# Patient Record
Sex: Male | Born: 1960 | Race: Black or African American | Hispanic: No | Marital: Married | Smoking: Current every day smoker
Health system: Southern US, Community
[De-identification: ages and names within clinical notes are randomized; demographics above are authoritative.]

## PROBLEM LIST (undated history)

## (undated) DIAGNOSIS — I255 Ischemic cardiomyopathy: Secondary | ICD-10-CM

## (undated) DIAGNOSIS — J40 Bronchitis, not specified as acute or chronic: Secondary | ICD-10-CM

## (undated) DIAGNOSIS — I10 Essential (primary) hypertension: Secondary | ICD-10-CM

## (undated) DIAGNOSIS — E785 Hyperlipidemia, unspecified: Secondary | ICD-10-CM

## (undated) DIAGNOSIS — I214 Non-ST elevation (NSTEMI) myocardial infarction: Secondary | ICD-10-CM

## (undated) DIAGNOSIS — C3492 Malignant neoplasm of unspecified part of left bronchus or lung: Secondary | ICD-10-CM

## (undated) DIAGNOSIS — I2510 Atherosclerotic heart disease of native coronary artery without angina pectoris: Secondary | ICD-10-CM

## (undated) DIAGNOSIS — I340 Nonrheumatic mitral (valve) insufficiency: Secondary | ICD-10-CM

## (undated) HISTORY — DX: Essential (primary) hypertension: I10

## (undated) HISTORY — DX: Ischemic cardiomyopathy: I25.5

## (undated) HISTORY — PX: COLONOSCOPY: SHX174

---

## 1999-09-10 ENCOUNTER — Emergency Department (HOSPITAL_COMMUNITY): Admission: EM | Admit: 1999-09-10 | Discharge: 1999-09-10 | Payer: Self-pay | Admitting: Emergency Medicine

## 2001-04-04 ENCOUNTER — Emergency Department (HOSPITAL_COMMUNITY): Admission: EM | Admit: 2001-04-04 | Discharge: 2001-04-04 | Payer: Self-pay | Admitting: Emergency Medicine

## 2001-04-07 ENCOUNTER — Emergency Department (HOSPITAL_COMMUNITY): Admission: EM | Admit: 2001-04-07 | Discharge: 2001-04-07 | Payer: Self-pay | Admitting: Internal Medicine

## 2001-04-10 ENCOUNTER — Emergency Department (HOSPITAL_COMMUNITY): Admission: EM | Admit: 2001-04-10 | Discharge: 2001-04-10 | Payer: Self-pay | Admitting: Emergency Medicine

## 2001-04-20 ENCOUNTER — Emergency Department (HOSPITAL_COMMUNITY): Admission: EM | Admit: 2001-04-20 | Discharge: 2001-04-20 | Payer: Self-pay | Admitting: *Deleted

## 2001-08-03 ENCOUNTER — Ambulatory Visit (HOSPITAL_COMMUNITY): Admission: RE | Admit: 2001-08-03 | Discharge: 2001-08-03 | Payer: Self-pay | Admitting: General Surgery

## 2003-06-16 HISTORY — PX: NECK SURGERY: SHX720

## 2003-07-25 ENCOUNTER — Ambulatory Visit (HOSPITAL_COMMUNITY): Admission: RE | Admit: 2003-07-25 | Discharge: 2003-07-25 | Payer: Self-pay | Admitting: Family Medicine

## 2003-10-27 ENCOUNTER — Emergency Department (HOSPITAL_COMMUNITY): Admission: EM | Admit: 2003-10-27 | Discharge: 2003-10-27 | Payer: Self-pay | Admitting: *Deleted

## 2003-11-08 ENCOUNTER — Encounter (HOSPITAL_COMMUNITY): Admission: RE | Admit: 2003-11-08 | Discharge: 2003-12-08 | Payer: Self-pay | Admitting: Family Medicine

## 2003-11-21 ENCOUNTER — Inpatient Hospital Stay (HOSPITAL_COMMUNITY): Admission: EM | Admit: 2003-11-21 | Discharge: 2003-11-22 | Payer: Self-pay | Admitting: *Deleted

## 2003-11-26 ENCOUNTER — Ambulatory Visit: Admission: RE | Admit: 2003-11-26 | Discharge: 2003-11-26 | Payer: Self-pay | Admitting: Neurosurgery

## 2003-12-08 ENCOUNTER — Inpatient Hospital Stay (HOSPITAL_COMMUNITY): Admission: RE | Admit: 2003-12-08 | Discharge: 2003-12-09 | Payer: Self-pay | Admitting: Neurosurgery

## 2003-12-11 ENCOUNTER — Emergency Department (HOSPITAL_COMMUNITY): Admission: EM | Admit: 2003-12-11 | Discharge: 2003-12-11 | Payer: Self-pay | Admitting: Emergency Medicine

## 2005-12-09 IMAGING — CR DG ABDOMEN ACUTE W/ 1V CHEST
3 series · 3 of 3 positions shown · non-contrast
Comparison: none

CLINICAL DATA: Abdominal pain. 
 ACUTE ABDOMEN SERIES ? 11/21/03
 No prior studies.

[view not recorded (1 of 3)]
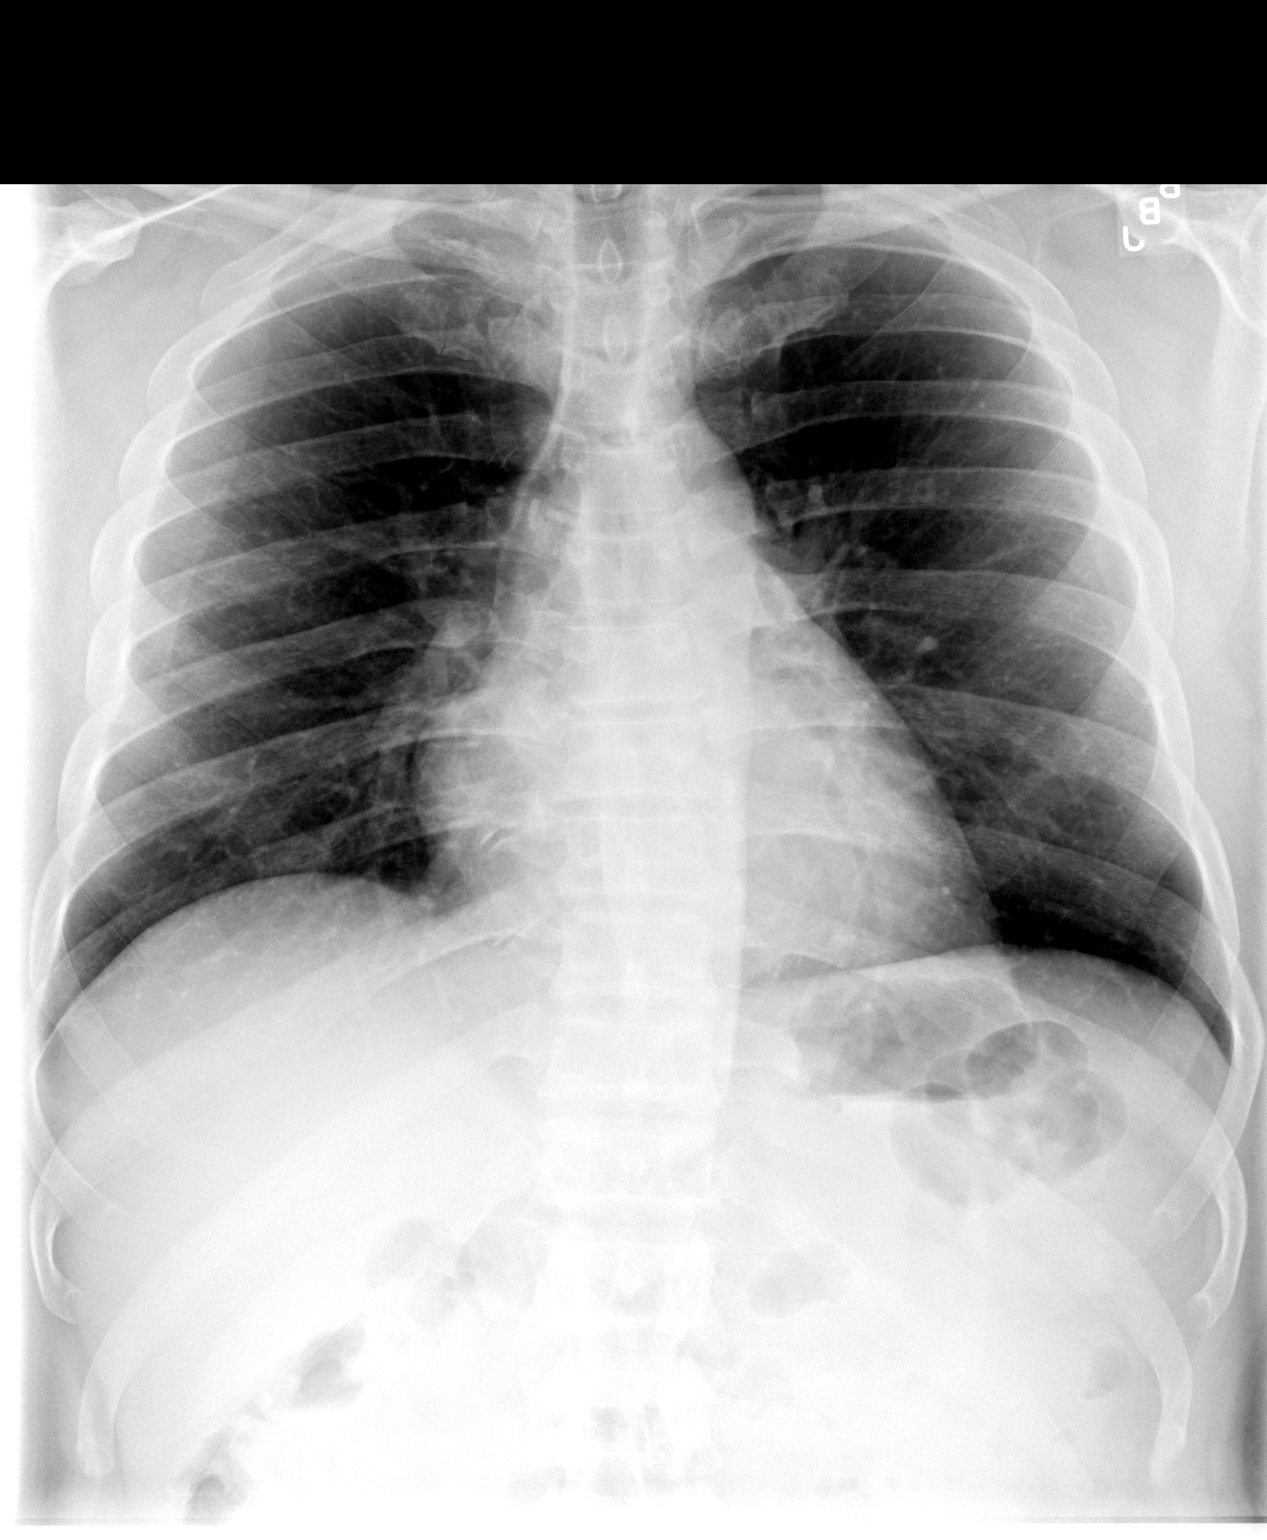

[view not recorded (2 of 3)]
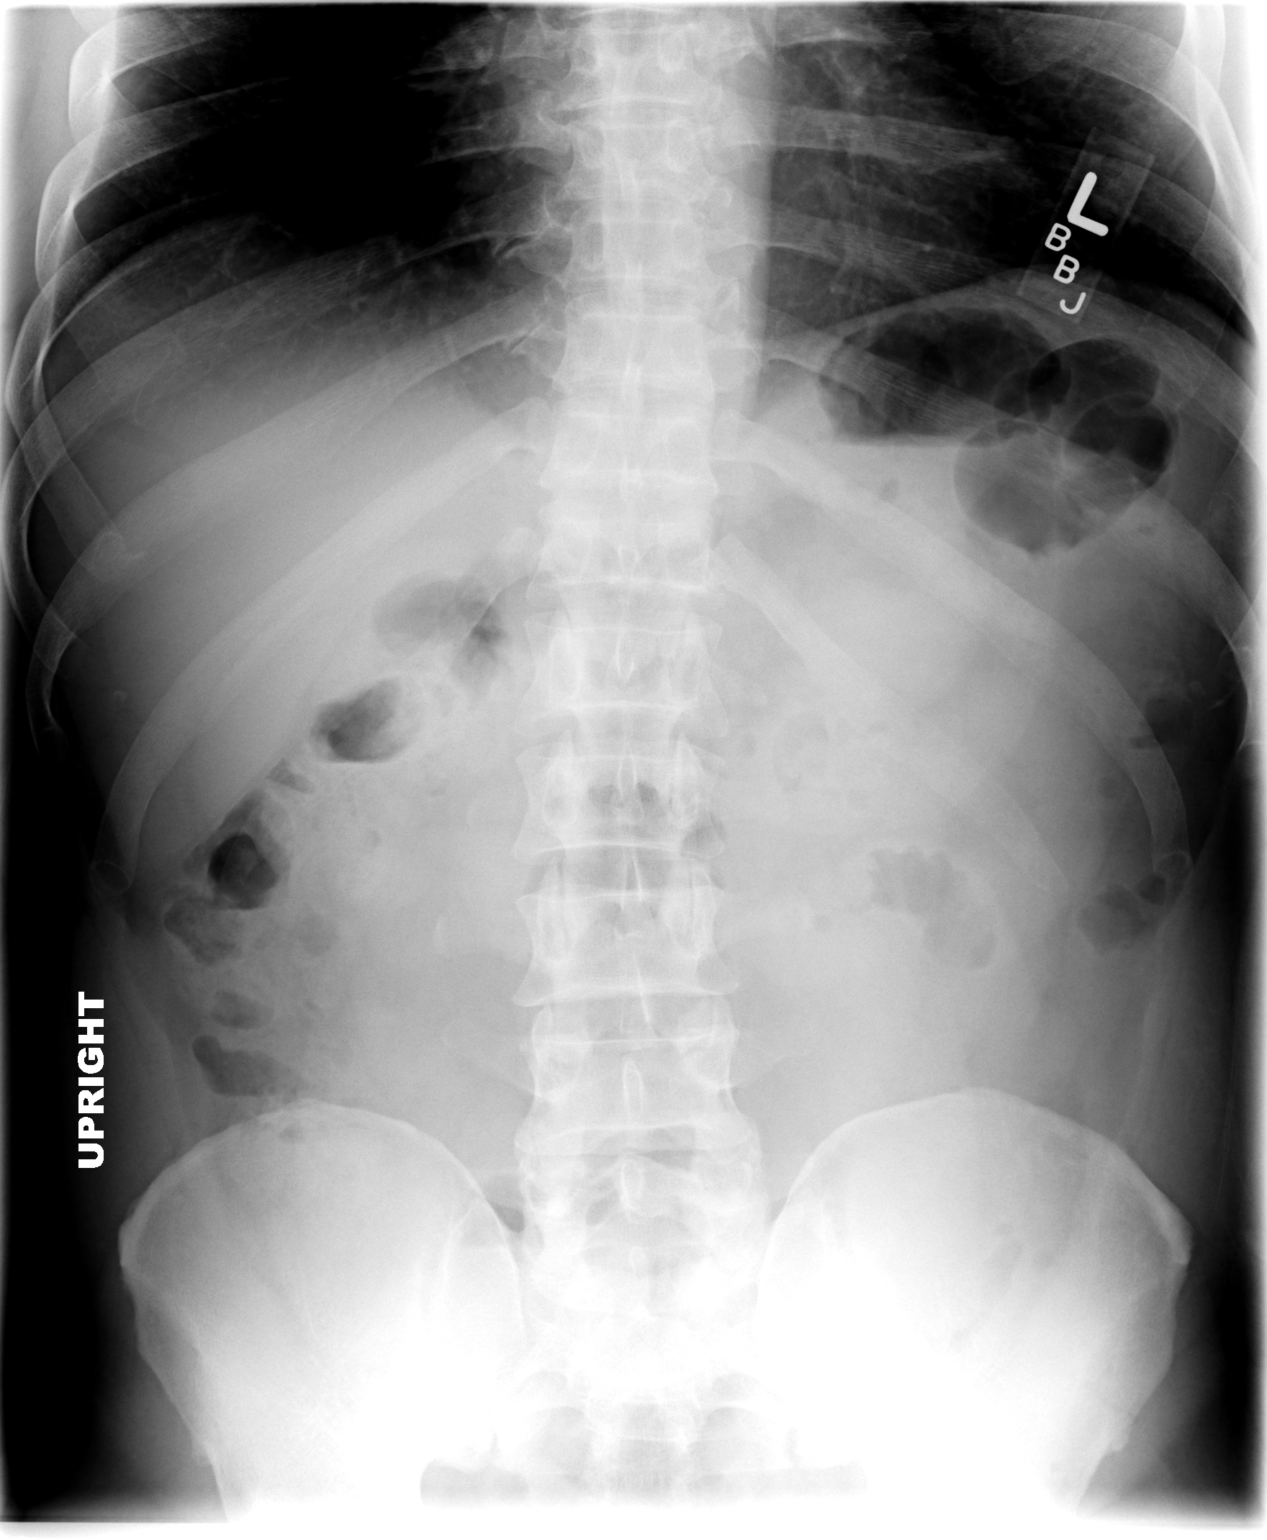

[view not recorded (3 of 3)]
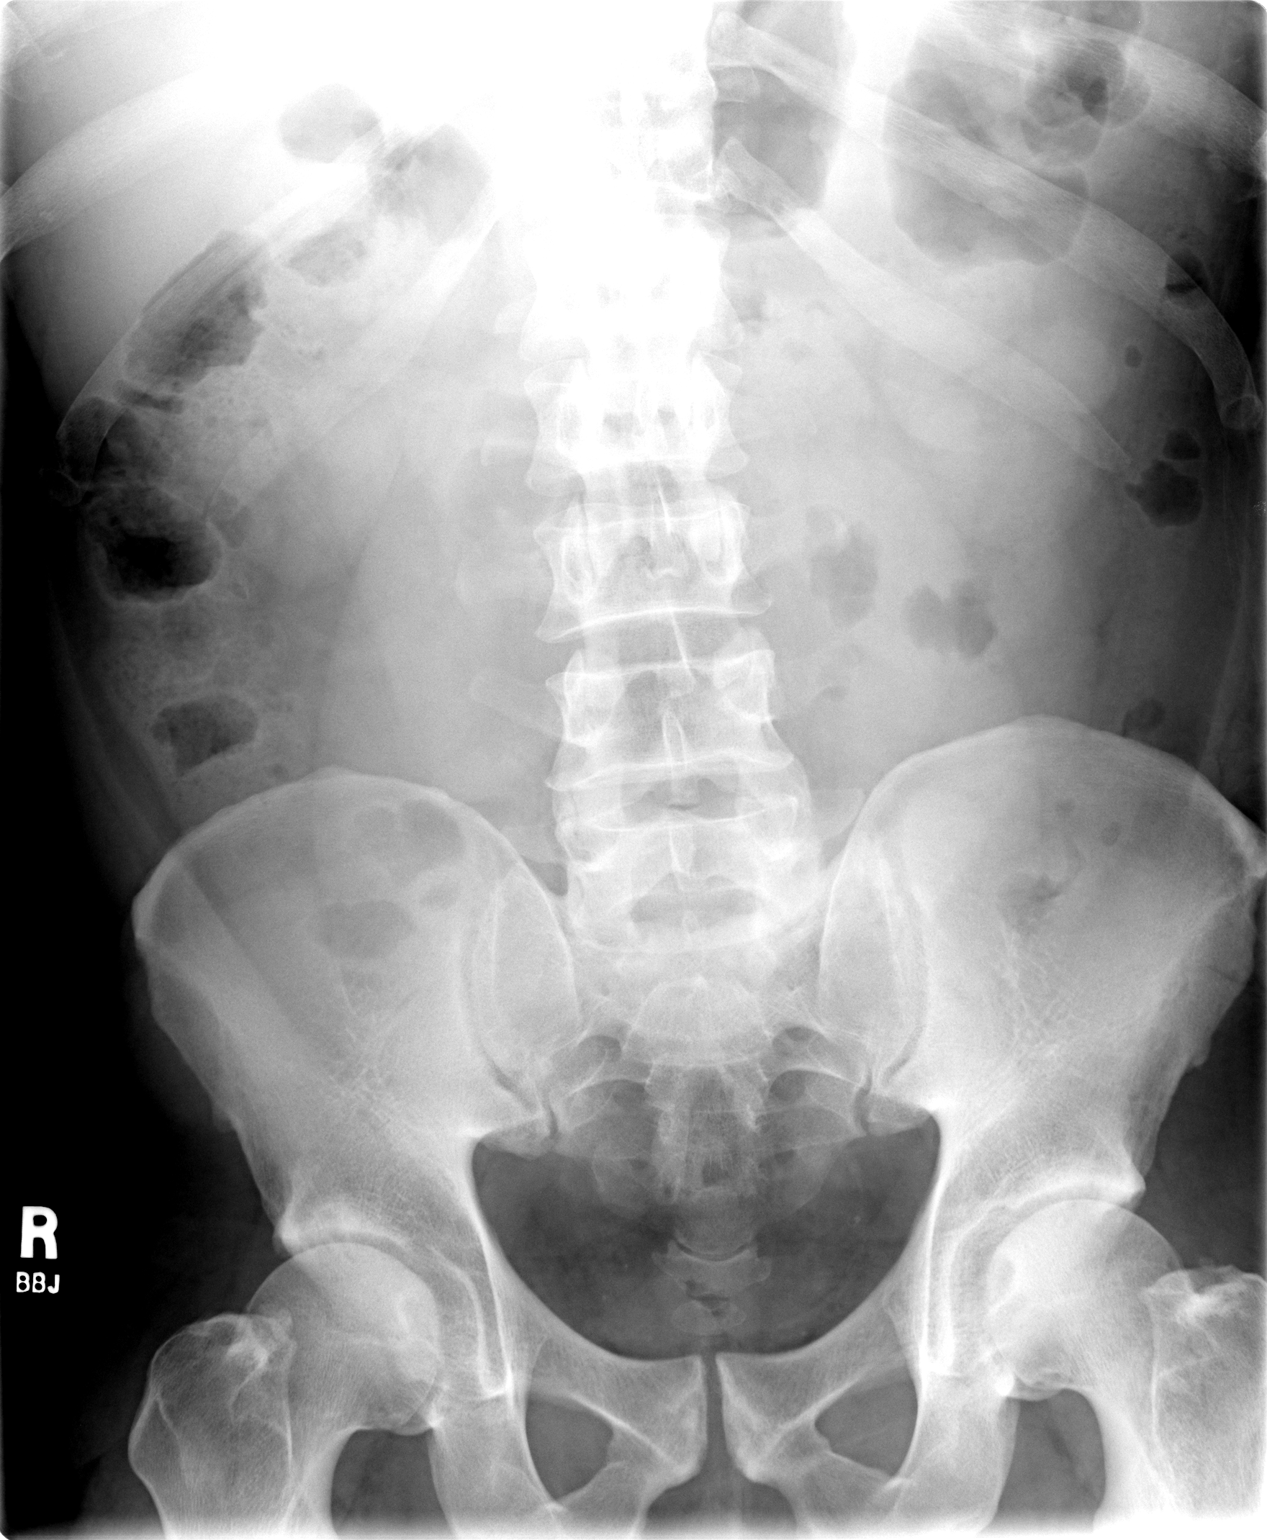

[3 of 3 positions shown; findings below may reference images not displayed]

FINDINGS: The lungs appear clear and the heart and the mediastinum are normal in radiographic appearance. No free intraperitoneal gas is evident beneath the hemidiaphragms.  No significant abnormal calcific densities are identified. No dilated bowel is noted.  Gas and stool are present in the colon. 
 IMPRESSION 
 No acute findings.

## 2005-12-09 IMAGING — CT CT PELVIS W/ CM
1 of 3 series · 14 of 32 positions shown, 19 images · IV contrast (CONTRAST)
Comparison: none

CLINICAL DATA: Abdominal pain and nausea/vomiting for one week.
 CT OF THE ABDOMEN AND PELVIS WITH CONTRAST
 During the intravenous administration of 100 cc Omnipaque 300, helical CT through the abdomen and pelvis was performed at 5 mm collimation.  Oral contrast had been administered.  Delayed helical 5 mm images through the kidneys were obtained. 
 Comparison none. 
 ABDOMEN
 The liver, spleen, pancreas, adrenal glands, and kidneys are normal in appearance.  Gallbladder is unremarkable by CT and there is no biliary ductal dilation.  The stomach and the visualized large and small bowel are unremarkable in the upper abdomen.  The appendix is identified in the right mid abdomen and is normal in appearance.  There is no free fluid.  There is no significant lymphadenopathy.  The visualized lung bases appear clear. 
 IMPRESSION
 Normal CT of the abdomen. 
 PELVIS
 The colon and small bowel are normal in appearance.  Urinary bladder is grossly normal.  The prostate gland is normal in size.  There is no significant lymphadenopathy.  There is no free fluid. 
 Normal CT of the pelvis.

[Series 5584: — · axial · 0.74mm/px · z∈[+1176,+1596]mm · 14 of 96 slices shown, 19 images]
[im 6/96  soft-tissue]
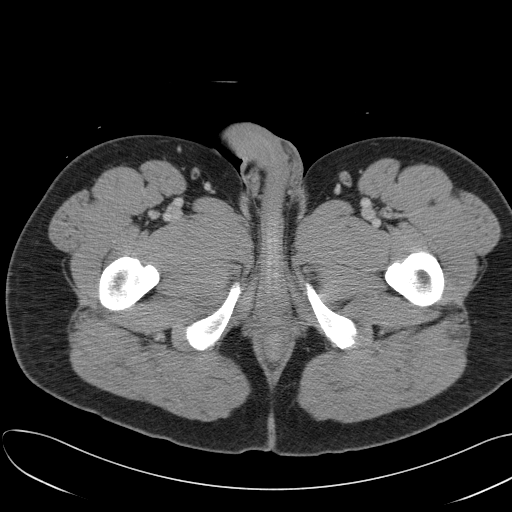
[im 6/96  bone]
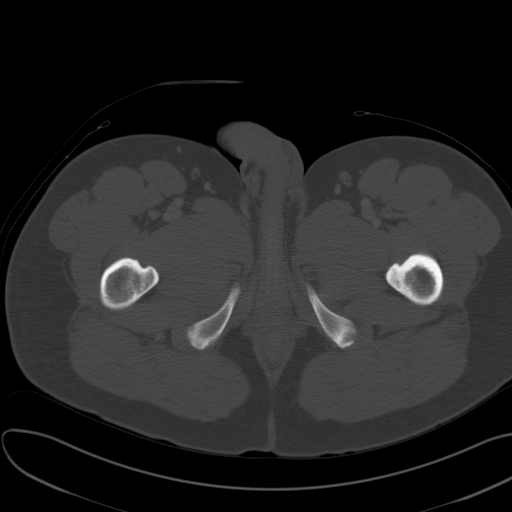
[im 12/96  soft-tissue]
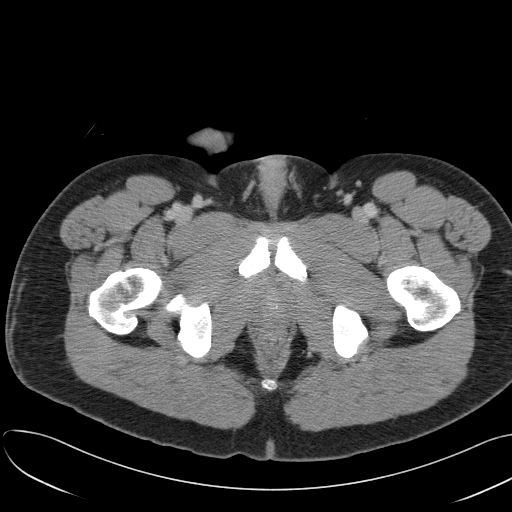
[im 18/96  soft-tissue]
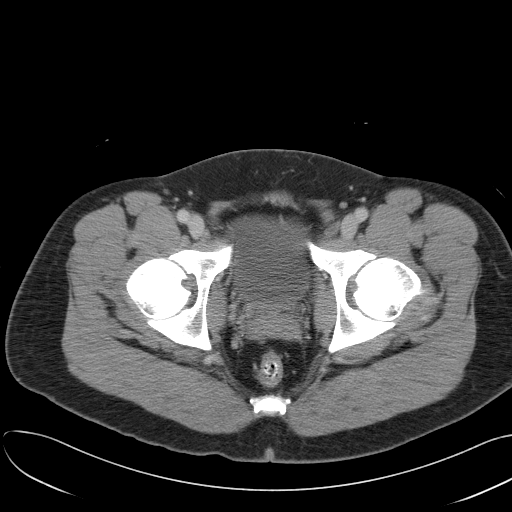
[im 30/96  soft-tissue]
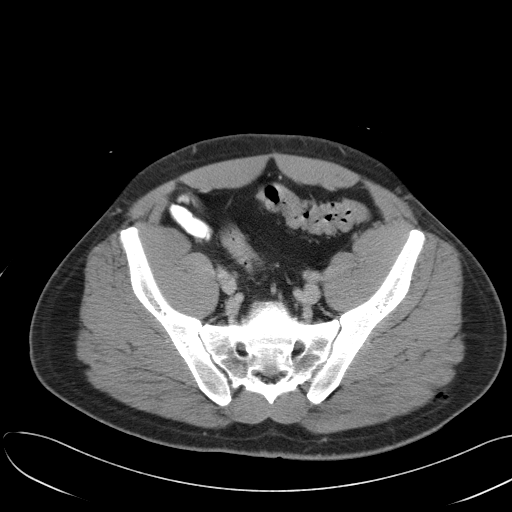
[im 36/96  soft-tissue]
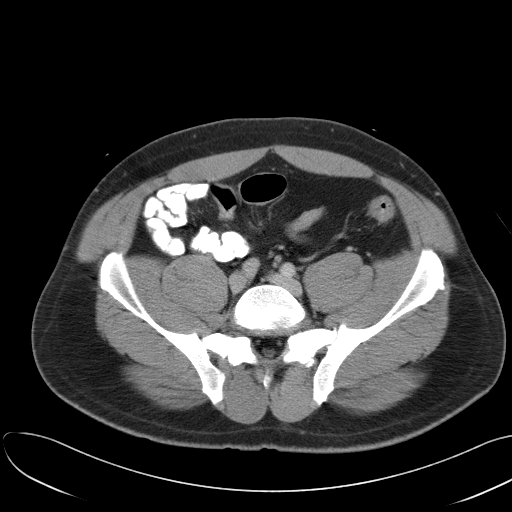
[im 42/96  soft-tissue]
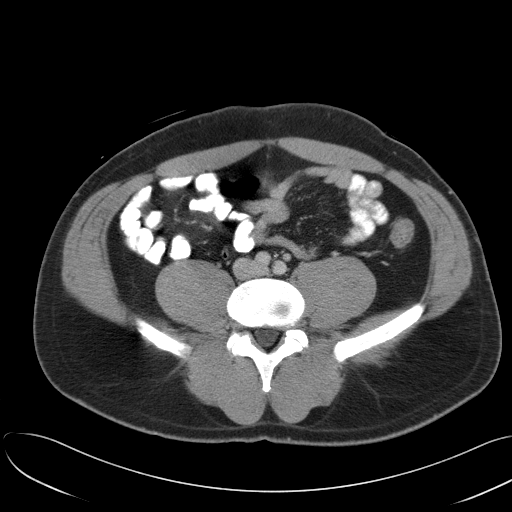
[im 48/96  soft-tissue]
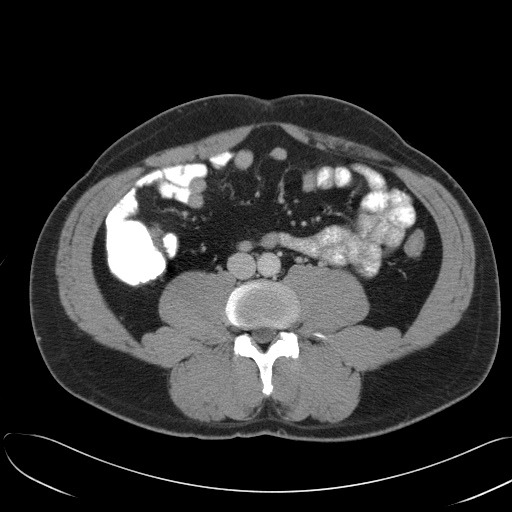
[im 54/96  soft-tissue]
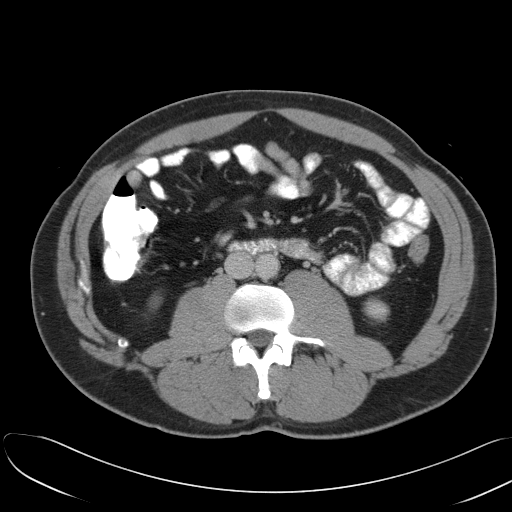
[im 60/96  soft-tissue]
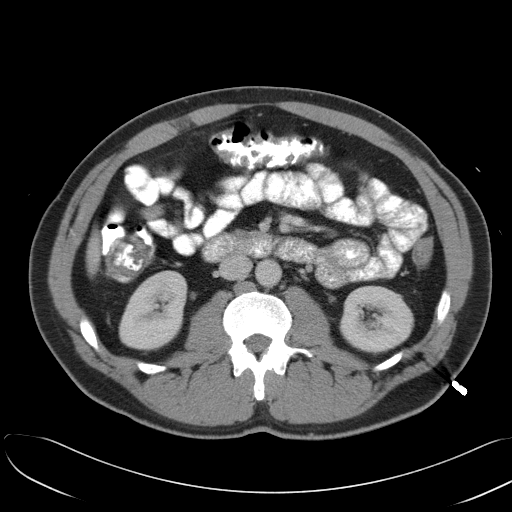
[im 60/96  bone]
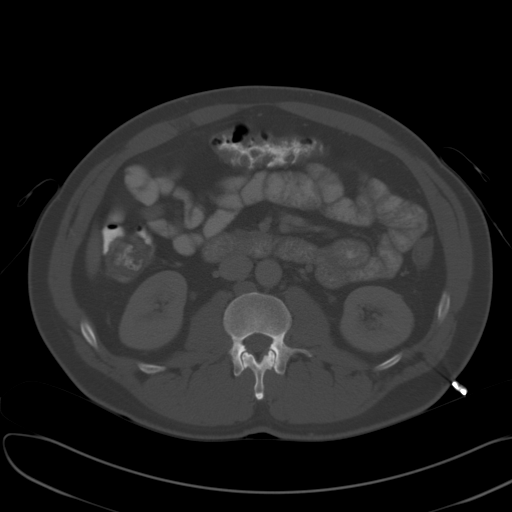
[im 66/96  soft-tissue]
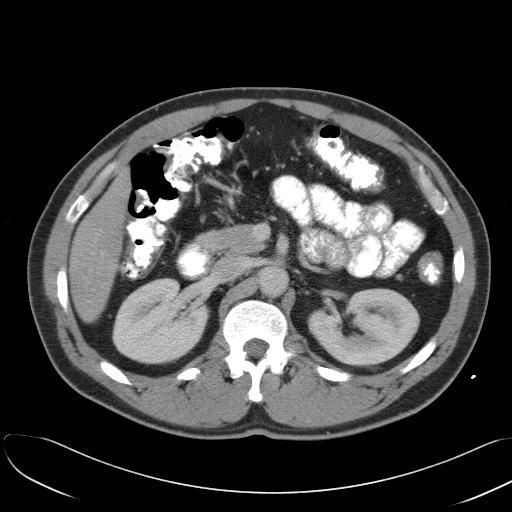
[im 72/96  lung]
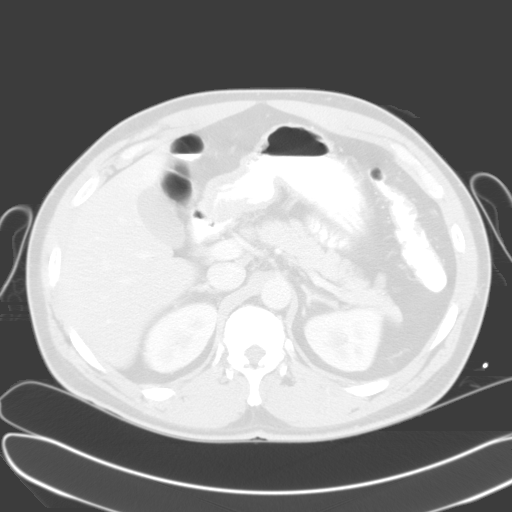
[im 78/96  soft-tissue]
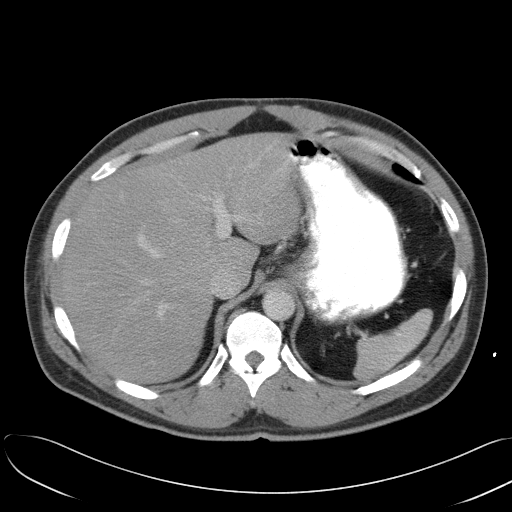
[im 78/96  lung]
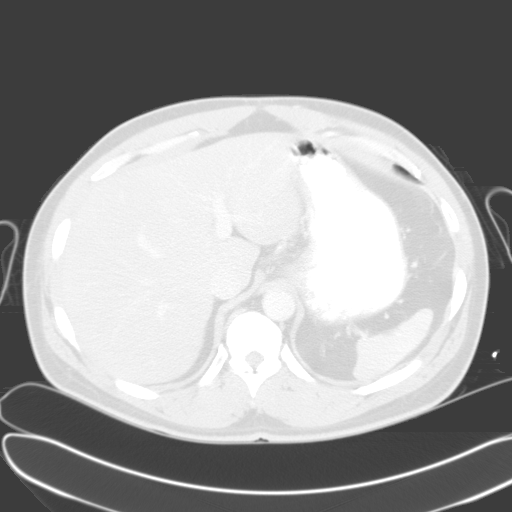
[im 84/96  soft-tissue]
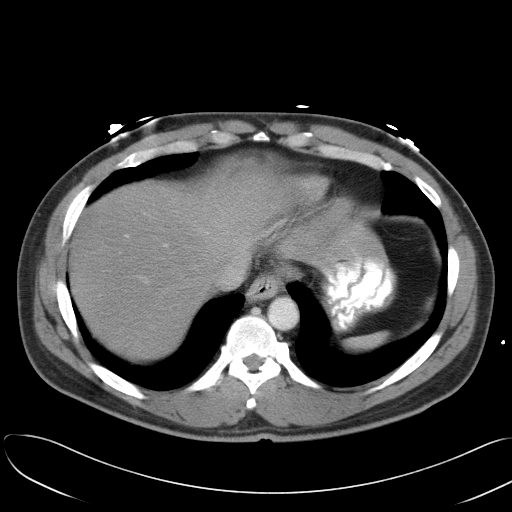
[im 84/96  lung]
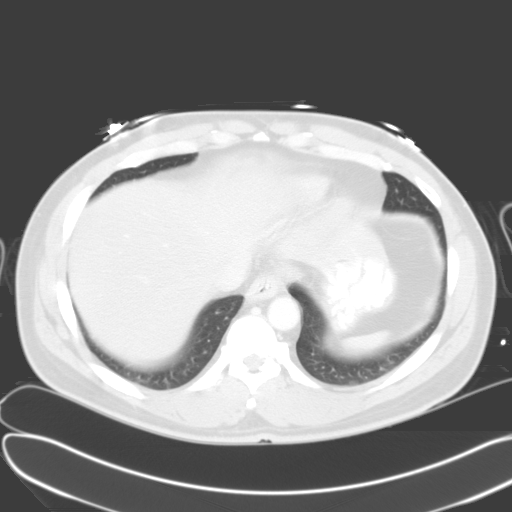
[im 90/96  soft-tissue]
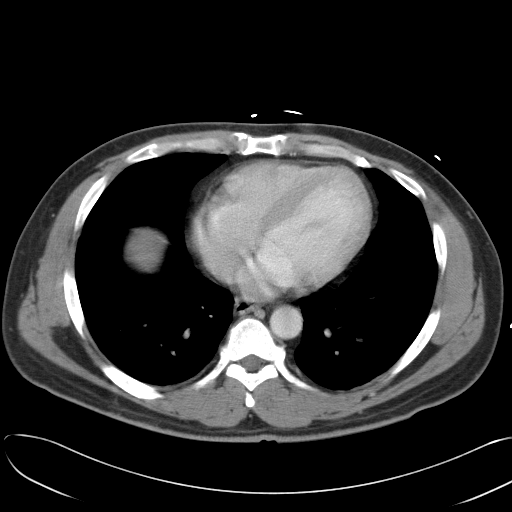
[im 90/96  lung]
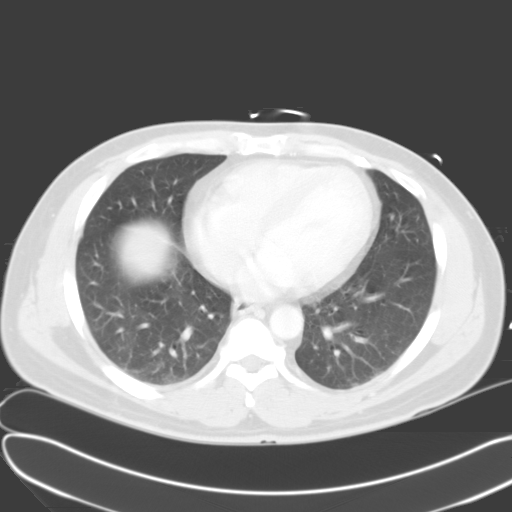

[14 of 32 positions shown; findings below may reference images not displayed]

## 2009-11-02 ENCOUNTER — Emergency Department (HOSPITAL_COMMUNITY): Admission: EM | Admit: 2009-11-02 | Discharge: 2009-11-02 | Payer: Self-pay | Admitting: Emergency Medicine

## 2010-07-05 ENCOUNTER — Encounter: Payer: Self-pay | Admitting: Family Medicine

## 2010-09-01 LAB — DIFFERENTIAL
Eosinophils Absolute: 0.3 10*3/uL (ref 0.0–0.7)
Eosinophils Relative: 3 % (ref 0–5)
Lymphocytes Relative: 39 % (ref 12–46)
Lymphs Abs: 3.3 10*3/uL (ref 0.7–4.0)
Monocytes Absolute: 0.8 10*3/uL (ref 0.1–1.0)
Monocytes Relative: 10 % (ref 3–12)

## 2010-09-01 LAB — POCT CARDIAC MARKERS: Troponin i, poc: <0.05 ng/mL (ref 0.00–0.09)

## 2010-09-01 LAB — BASIC METABOLIC PANEL
BUN: 12 mg/dL (ref 6–23)
Chloride: 104 mEq/L (ref 96–112)
Potassium: 3.9 mEq/L (ref 3.5–5.1)
Sodium: 136 mEq/L (ref 135–145)

## 2010-09-01 LAB — CBC
HCT: 41 % (ref 39.0–52.0)
Hemoglobin: 14 g/dL (ref 13.0–17.0)
MCV: 90.9 fL (ref 78.0–100.0)
Platelets: 234 10*3/uL (ref 150–400)
WBC: 8.3 10*3/uL (ref 4.0–10.5)

## 2010-10-31 NOTE — Discharge Summary (Signed)
NAME:  Rodney, Mahoney                     ACCOUNT NO.:  [id number]   MEDICAL RECORD NO.:  [id number]                   PATIENT TYPE:  INP   LOCATION:  A320                                 FACILITY:  [facility]   PHYSICIAN:  Dirk Dress. Katrinka Blazing, M.D.                DATE OF BIRTH:  11-26-1960   DATE OF ADMISSION:  11/21/2003  DATE OF DISCHARGE:  11/22/2003                                 DISCHARGE SUMMARY   DISCHARGE DIAGNOSES:  1. Acute gastroenteritis, treated, improved.  2. History of peptic ulcer disease.   DISPOSITION:  The patient is discharged home in stable and satisfactory  condition.   No medications were given.   He is to follow up in the office on December 05, 2003.   SUMMARY:  A 50 year old male with a one day history of recurrent abdominal  pain, nausea, vomiting, and diarrhea.  He has had multiple episodes of  vomiting and diarrhea.  He relates the onset of symptoms to Tylox, but he  was having the pain before he took the Tylox.  He denies hematemesis or  hematochezia.  There has been no relationship to food.  He has had no relief  with over the counter medications.  He had no response to medications in the  emergency room.  Because the patient remained severely symptomatic with  recurrent episodes of retching and multiple episodes of diarrhea in the  emergency room, he was admitted for observation and treatment.  He was also  very uncooperative during his evaluation in the emergency room.   PAST HISTORY:  He has a history of peptic ulcer disease, documented by EGD  in 2003.  He was H. pylori positive.  He did not take the medications for H.  pylori which was a pred pack.  He states that he could not afford the  medication.  He has not had any worsening symptoms since then.   He is not on any other medications.   He has had no surgery.   He has no allergies.   PHYSICAL EXAMINATION:  GENERAL:  He is in moderate to severe distress due to  recurrent nausea and vomiting.  VITAL SIGNS:  Blood pressure 160/90, pulse 78, respirations 18, temperature  99.3.  HEENT:  Unremarkable.  NECK:  Supple.  CHEST:  Clear.  HEART:  Regular rate and rhythm without murmur, gallop or rub.  ABDOMEN:  Soft, mild distention with mild guarding diffusely.  Hyperactive  bowel sounds.  No masses.   The patient was admitted, started on IV fluids and antiemetics.  He was  given a combination of Zofran 4 mg IV q.8h., Reglan 10 mg IV q.6h. and  Phenergan 12.5 mg IV q.4h. p.r.n.  He was given Demerol for pain.  With this  regimen, he improved.   LABS:  Labs were normal.  White count was 9,500, hemoglobin 14.8.  Sodium  134, potassium 3. 5.  SGOT was mildly elevated  at 50.  Urinalysis was  normal.   CT of the abdomen and pelvis was normal.   HOSPITAL COURSE:  The patient gradually improved.  His symptoms resolved.  By the following morning, he was asymptomatic.  He had no complaints.  He  tolerated a regular diet.  Abdominal exam was totally benign.  He was  discharged home in satisfactory condition with plans for followup as needed.     ___________________________________________                                         Dirk Dress Katrinka Blazing, M.D.   LCS/MEDQ  D:  12/29/2003  T:  12/29/2003  Job:  [id number]

## 2010-10-31 NOTE — Consult Note (Signed)
NAME:  Rodney Mahoney, Rodney Mahoney NO.:  [id number]   MEDICAL RECORD NO.:  [id number]                   PATIENT TYPE:  EMS   LOCATION:  MAJO                                 FACILITY:  [facility]   PHYSICIAN:  Hilda Lias, M.D.                DATE OF BIRTH:  09/04/1960   DATE OF CONSULTATION:  12/11/2003  DATE OF DISCHARGE:                                   CONSULTATION   [name] is a gentleman who underwent anterior cervical diskectomy by  Dr. Shirlean Kelly this past Saturday, June 25.  The patient was discharged  by Dr. Lovell Sheehan on Sunday.  Today, I was called by the emergency room  physician because [name] showed up in the emergency room telling  them that he had some difficulty swallowing.  The emergency room physician  ordered a CT scan of the head and I came to see him soon after.  Clinically,  the patient is stable.  He has no difficulty breathing whatsoever.  The  trachea is in the midline.  There is a little bit of swelling and tenderness  in the left side but he is able to swallow with mild difficulty.  There is  no weakness in the upper extremity and he has some pain between the shoulder  blades.  Upon talking to him and the lady who is with him, she was telling  me that he is eating a lot of heavy stuff like chicken and that makes him  really nauseated.  The CT scan was read with Dr. Lyman Bishop who felt that  there was no evidence of any hematoma but there is a little bit of swelling  in the prevertebral space.  The trachea is in the midline as well as the  esophagus.  I talked to [name] and the lady who was with him to see  where he wanted to go.  I told him that since he was in the hospital I would  be more than happy to admit him to the hospital for the next 24-48 hours to  help with the swelling.  The other possibility since he lives about 25  minutes from here is that I would give some hydrocortisone intravenously and  he could go  home and then he can call me in case of worsening or to show up  back in the emergency room.  If he goes home, I am going to give him some  steroids p.o. to be taken and he was encouraged not to eat any solid food  but really soft.  At the end, he wanted to go home and I will follow him in  my office.  Hilda Lias, M.D.    EB/MEDQ  D:  12/11/2003  T:  12/11/2003  Job:  [id number]

## 2010-10-31 NOTE — H&P (Signed)
[facility]  Patient:    Rodney Mahoney, Rodney Mahoney Visit Number: [id number] MRN: [id number]          Service Type: EMS Location: ED Attending Physician:  Rosalyn Charters Dictated by:   Elpidio Anis, M.D. Admit Date:  04/20/2001 Discharge Date: 04/20/2001                           History and Physical  HISTORY OF PRESENT ILLNESS:  The patient is a 50 year old male with a history of abdominal pain, made worse with hunger, improved with food, not exacerbated by alcohol.  He is H. pylori positive.  He has been treated with Prevacid without improvement.  Plans were to have him take Prevpac, but he could not afford it.  His symptoms persist, and he is scheduled for upper endoscopy for evaluation of his abdominal discomfort.  PAST MEDICAL HISTORY:  Totally unremarkable except as related to his present condition.  REVIEW OF SYSTEMS:  Positive for some history of anxiety but otherwise unremarkable.  PHYSICAL EXAMINATION:  GENERAL:  He is a healthy-appearing male in no acute distress.  VITAL SIGNS:  Blood pressure 120/80, pulse 76, respirations 20, weight 185 pounds.  HEENT:  Unremarkable.  NECK:  Supple without JVD or bruit.  CHEST:  Clear to auscultation with no rales, rubs, rhonchi, or wheezes.  HEART:  Regular rate and rhythm with no murmur, gallop, or rub.  ABDOMEN:  Soft, nontender, with no masses.  Normoactive bowel sounds.  EXTREMITIES:  No cyanosis, clubbing, or edema.  NEUROLOGIC:  No focal motor, sensory, or cerebellar deficit.  IMPRESSION: 1. Chronic dyspepsia. 2. Gastroesophageal reflux disease.  PLAN:  Upper endoscopy. Dictated by:   Elpidio Anis, M.D. Attending Physician:  Rosalyn Charters DD:  08/02/01 TD:  08/02/01 Job: [id number]

## 2010-10-31 NOTE — Op Note (Signed)
NAME:  Rodney Mahoney, Rodney Mahoney                     ACCOUNT NO.:  [id number]   MEDICAL RECORD NO.:  [id number]                   PATIENT TYPE:  INP   LOCATION:  3009                                 FACILITY:  [facility]   PHYSICIAN:  Hewitt Shorts, M.D.            DATE OF BIRTH:  Dec 22, 1960   DATE OF PROCEDURE:  12/08/2003  DATE OF DISCHARGE:  12/09/2003                                 OPERATIVE REPORT   PREOPERATIVE DIAGNOSIS:  C5-6 and C6-7cervical disk herniation, cervical  degenerative disk disease, cervical spondylosis and cervical radiculopathy.   POSTOPERATIVE DIAGNOSIS:  C5-6 and C6-7cervical disk herniation, cervical  degenerative disk disease, cervical spondylosis and cervical radiculopathy.   OPERATION PERFORMED:  C5-6 and C6-7 anterior cervical diskectomy and  arthrodesis with iliac crest allograft and Uniplate cervical plating.   SURGEON:  Hewitt Shorts, M.D.   ANESTHESIA:  General endotracheal.   INDICATIONS FOR PROCEDURE:  The patient is a 50 year old man who presented  with cervical radiculopathy and was found to have a large disk herniation  superimposed upon significant underlying degenerative disk disease and  spondylosis.  Decision was made to proceed with elective anterior cervical  diskectomy and arthrodesis.   DESCRIPTION OF PROCEDURE:  The patient was brought to the operating room and  placed under general endotracheal anesthesia.  The patient was placed in 10  pounds of halter traction.  The neck was prepped with Betadine soap and  solution and draped in sterile fashion.  A horizontal incision was made on  the left side of the neck.  The line of the incision was infiltrated with  local anesthetic and epinephrine.  Incision was made, carried down through  the subcutaneous tissue.  Bipolar cautery and electrocautery used to  maintain hemostasis.  Dissection was carried down to the platysma and then  dissection was carried out to an avascular plane, leaving  the  sternocleidomastoid, carotid artery and jugular vein laterally and trachea  and esophagus medially.  The ventral aspect of the vertebral column  identified and a localizing x-ray was taken.  The C5-6 and C6-7  intervertebral disk spaces were identified.  Diskectomy was begun with  incision of the annulus and continued with microcurets and pituitary  rongeurs.  The cartilaginous end plates of the corresponding vertebrae were  removed using microcurets along with the Ultra Max drill.  The operating  microscope was draped and brought into the field to provide additional  magnification, illumination and visualization and the remainder of the  decompression was performed using microdissection and microsurgical  technique.  There was significant osteophytic overgrowth at each level.  This was carefully removed using the Ultra Max drill and 2 mm Kerrison  punches with thin foot plate.  At the C6-7 level we encountered a large disk  herniation to the right that was carefully removed in a piecemeal fashion.  There was a small disk herniation to the right at C5-6 which was likewise  removed.  In the end, all of the spondylitic overgrowth and disk herniation  was removed and we were able to decompress the spinal canal and thecal sac  as well as the neural foramina and nerve roots bilaterally.  Once the  decompression was completed, hemostasis was established with the use of  Gelfoam soaked in Thrombin.  However, we were able to remove all the Gelfoam  prior to proceeding with the arthrodesis.  We measured the height of the  disk spaces using bone spacers and selected two  7 mm tricortical iliac crest allografts.  Each was hydrated in saline  solution and then positioned in the intervertebral disk space and  countersunk.  Once both grafts were placed, the traction was discontinued.  We selected a 34 mm Uniplate and it was positioned over the fusion construct  and secured to the vertebrae with 13  mm self-tapping screw.  All three  screws were fully tightened and then the locking cams were secured.  An x-  ray was taken which showed the screws in C5 and C6 to be in good position as  well as the graft at C5-6.  The graft at C6-7 and the screw at C7 could not  be well visualized.  The wound was irrigated with bacitracin solution,  checked for hemostasis which was established and confirmed and then we  proceeded with closure.  The platysma was closed with interrupted inverted 2-  0 undyed Vicryl sutures.  Subcutaneous and subcuticular layer were closed  with interrupted inverted 3-0 undyed Vicryl sutures and the skin edges were  approximated with Dermabond.  The patient tolerated the procedure well.  The  estimated blood loss was 25 mL.  Sponge, needle and instrument counts were  correct.  Following surgery, the patient was to be placed in a soft cervical  collar, reversed from his anesthetic, extubated and transferred to the  recovery room for further care.                                               Hewitt Shorts, M.D.    RWN/MEDQ  D:  12/08/2003  T:  12/09/2003  Job:  [id number]

## 2010-10-31 NOTE — H&P (Signed)
[facility]  Patient:    Rodney Mahoney, Rodney Mahoney Visit Number: [id number] MRN: [id number]          Service Type: EMS Location: ED Attending Physician:  Rosalyn Charters Dictated by:   Elpidio Anis, M.D. Admit Date:  04/20/2001 Discharge Date: 04/20/2001                           History and Physical  HISTORY OF PRESENT ILLNESS:  A 50 year old male with recurrent history of abdominal pain made worse with hunger and improved with food, not exacerbated by alcohol.  He is H. pylori positive.  He has been taking Prevacid without improvement.  He cannot afford the Prevpak.  The patient is scheduled for upper endoscopy to evaluate the severity of his peptic ulcer disease.  PAST MEDICAL HISTORY:  Negative.  He has no other medical history.  ALLERGIES:  He has no allergies.  MEDICATIONS:  His only medication is Prevacid 30 mg q.d.  PHYSICAL EXAMINATION:  VITAL SIGNS:  Blood pressure 122/80, pulse 76, respirations 20, weight 189 pounds.  GENERAL:  He is a healthy-appearing male in no acute distress.  HEENT:  Normal.  NECK:  Supple without JVD or bruit.  CHEST:  Clear to auscultation.  HEART:  Regular rate and rhythm without murmur, gallop, or rub.  ABDOMEN:  Soft, nontender.  No masses.  Normal active bowel sounds.  EXTREMITIES:  No cyanosis, clubbing, or edema.  NEUROLOGIC:  Exam nonfocal.  IMPRESSION: 1. Chronic dyspepsia. 2. Probable gastroesophageal reflux disease.  PLAN:  Upper endoscopy. Dictated by:   Elpidio Anis, M.D. Attending Physician:  Rosalyn Charters DD:  07/26/01 TD:  07/26/01 Job: [id number]

## 2011-02-03 ENCOUNTER — Telehealth (INDEPENDENT_AMBULATORY_CARE_PROVIDER_SITE_OTHER): Payer: Self-pay | Admitting: *Deleted

## 2011-02-03 ENCOUNTER — Encounter (INDEPENDENT_AMBULATORY_CARE_PROVIDER_SITE_OTHER): Payer: Self-pay | Admitting: *Deleted

## 2011-02-03 NOTE — Telephone Encounter (Signed)
TCS sch'd 04/23/11 @ 12:00 (11:00), half lytely instructions mailed on 02/03/11

## 2011-02-04 ENCOUNTER — Encounter (INDEPENDENT_AMBULATORY_CARE_PROVIDER_SITE_OTHER): Payer: Self-pay | Admitting: Internal Medicine

## 2011-04-22 MED ORDER — SODIUM CHLORIDE 0.45 % IV SOLN: Freq: Once | INTRAVENOUS | Status: DC

## 2011-04-23 ENCOUNTER — Encounter (HOSPITAL_COMMUNITY): Admission: RE | Payer: Self-pay | Source: Ambulatory Visit

## 2011-04-23 ENCOUNTER — Ambulatory Visit (HOSPITAL_COMMUNITY)
Admission: RE | Admit: 2011-04-23 | Payer: BC Managed Care – PPO | Source: Ambulatory Visit | Admitting: Internal Medicine

## 2011-04-23 SURGERY — COLONOSCOPY
Anesthesia: Moderate Sedation

## 2011-06-18 ENCOUNTER — Encounter (INDEPENDENT_AMBULATORY_CARE_PROVIDER_SITE_OTHER): Payer: Self-pay | Admitting: *Deleted

## 2012-03-19 ENCOUNTER — Emergency Department (HOSPITAL_COMMUNITY)
Admission: EM | Admit: 2012-03-19 | Discharge: 2012-03-19 | Disposition: A | Discharge status: Home / Self Care | Payer: BC Managed Care – PPO | Attending: Emergency Medicine | Admitting: Emergency Medicine

## 2012-03-19 ENCOUNTER — Encounter (HOSPITAL_COMMUNITY): Payer: Self-pay

## 2012-03-19 DIAGNOSIS — M545 Low back pain, unspecified: Secondary | ICD-10-CM | POA: Insufficient documentation

## 2012-03-19 DIAGNOSIS — R059 Cough, unspecified: Secondary | ICD-10-CM | POA: Insufficient documentation

## 2012-03-19 DIAGNOSIS — S39012A Strain of muscle, fascia and tendon of lower back, initial encounter: Secondary | ICD-10-CM

## 2012-03-19 DIAGNOSIS — M25519 Pain in unspecified shoulder: Secondary | ICD-10-CM | POA: Insufficient documentation

## 2012-03-19 DIAGNOSIS — F17200 Nicotine dependence, unspecified, uncomplicated: Secondary | ICD-10-CM | POA: Insufficient documentation

## 2012-03-19 DIAGNOSIS — M542 Cervicalgia: Secondary | ICD-10-CM | POA: Insufficient documentation

## 2012-03-19 DIAGNOSIS — R05 Cough: Secondary | ICD-10-CM | POA: Insufficient documentation

## 2012-03-19 DIAGNOSIS — I10 Essential (primary) hypertension: Secondary | ICD-10-CM | POA: Insufficient documentation

## 2012-03-19 DIAGNOSIS — S46919A Strain of unspecified muscle, fascia and tendon at shoulder and upper arm level, unspecified arm, initial encounter: Secondary | ICD-10-CM

## 2012-03-19 HISTORY — DX: Bronchitis, not specified as acute or chronic: J40

## 2012-03-19 MED ORDER — HYDROCODONE-ACETAMINOPHEN 7.5-325 MG PO TABS: 1.0000 | ORAL_TABLET | ORAL | Status: DC | PRN

## 2012-03-19 MED ORDER — MELOXICAM 7.5 MG PO TABS: ORAL_TABLET | ORAL | Status: DC

## 2012-03-19 MED ORDER — METHOCARBAMOL 500 MG PO TABS: ORAL_TABLET | ORAL | Status: DC

## 2012-03-19 NOTE — ED Notes (Signed)
c-collar placed in triage
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
## 2012-03-28 NOTE — ED Provider Notes (Signed)
Medical screening examination/treatment/procedure(s) were performed by non-physician practitioner and as supervising physician I was immediately available for consultation/collaboration.   Benny Lennert, MD 03/28/12 1438

## 2014-10-09 ENCOUNTER — Encounter (HOSPITAL_COMMUNITY): Payer: Self-pay | Admitting: Cardiology

## 2014-10-09 ENCOUNTER — Emergency Department (HOSPITAL_COMMUNITY)
Admission: EM | Admit: 2014-10-09 | Discharge: 2014-10-09 | Disposition: A | Discharge status: Home / Self Care | Payer: BLUE CROSS/BLUE SHIELD | Attending: Emergency Medicine | Admitting: Emergency Medicine

## 2014-10-09 DIAGNOSIS — Z720 Tobacco use: Secondary | ICD-10-CM | POA: Insufficient documentation

## 2014-10-09 DIAGNOSIS — I10 Essential (primary) hypertension: Secondary | ICD-10-CM | POA: Diagnosis not present

## 2014-10-09 DIAGNOSIS — K649 Unspecified hemorrhoids: Secondary | ICD-10-CM | POA: Diagnosis present

## 2014-10-09 DIAGNOSIS — Z8709 Personal history of other diseases of the respiratory system: Secondary | ICD-10-CM | POA: Diagnosis not present

## 2014-10-09 DIAGNOSIS — L02215 Cutaneous abscess of perineum: Secondary | ICD-10-CM | POA: Diagnosis not present

## 2014-10-09 MED ORDER — DOXYCYCLINE HYCLATE 100 MG PO CAPS: 100.0000 mg | ORAL_CAPSULE | Freq: Two times a day (BID) | ORAL | Status: DC

## 2014-10-09 MED ORDER — KETOROLAC TROMETHAMINE 10 MG PO TABS
10.0000 mg | ORAL_TABLET | Freq: Once | ORAL | Status: AC
Start: 2014-10-09 — End: 2014-10-09
  Administered 2014-10-09: 10 mg via ORAL
  Filled 2014-10-09: qty 1

## 2014-10-09 MED ORDER — HYDROCODONE-ACETAMINOPHEN 5-325 MG PO TABS: 1.0000 | ORAL_TABLET | ORAL | Status: DC | PRN

## 2014-10-09 MED ORDER — MELOXICAM 7.5 MG PO TABS: ORAL_TABLET | ORAL | Status: DC

## 2014-10-09 MED ORDER — DOXYCYCLINE HYCLATE 100 MG PO TABS
100.0000 mg | ORAL_TABLET | Freq: Two times a day (BID) | ORAL | Status: DC
  Administered 2014-10-09: 100 mg via ORAL
  Filled 2014-10-09: qty 1

## 2014-10-09 NOTE — ED Provider Notes (Signed)
CSN: [id number]     Arrival date & time 10/09/14  0919 History   First MD Initiated Contact with Patient 10/09/14 [phone]     Chief Complaint  Patient presents with  . Hemorrhoids     (Consider location/radiation/quality/duration/timing/severity/associated sxs/prior Treatment) HPI Comments: Patient is a 54 year old male who presents to the emergency department with a complaint of hemorrhoid pain. The patient states this problem started on yesterday April 25. He has not had any drainage from the area. He's not had any high fever from the area. The area near the rectum is not bleeding at this time. He states that it hurts with certain positions that he sits or stands, and it hurts when he has to pass his bowels. He has tried some warm tub soaks, but states it feels as though this is making it worse instead of better.  The history is provided by the patient.    Past Medical History  Diagnosis Date  . Hypertension   . Bronchitis    Past Surgical History  Procedure Laterality Date  . Neck surgery     History reviewed. No pertinent family history. History  Substance Use Topics  . Smoking status: Current Every Day Smoker    Types: Cigarettes  . Smokeless tobacco: Not on file  . Alcohol Use: Yes     Comment: occ    Review of Systems  Constitutional: Negative for activity change.       All ROS Neg except as noted in HPI  HENT: Negative for nosebleeds.   Eyes: Negative for photophobia and discharge.  Respiratory: Negative for cough, shortness of breath and wheezing.   Cardiovascular: Negative for chest pain and palpitations.  Gastrointestinal: Positive for rectal pain. Negative for abdominal pain, blood in stool and anal bleeding.  Genitourinary: Negative for dysuria, frequency, hematuria, discharge, scrotal swelling, penile pain and testicular pain.  Musculoskeletal: Negative for back pain, arthralgias and neck pain.  Skin: Negative.   Neurological: Negative for dizziness, seizures  and speech difficulty.  Psychiatric/Behavioral: Negative for hallucinations and confusion.      Allergies  Review of patient's allergies indicates no known allergies.  Home Medications   Prior to Admission medications   Medication Sig Start Date End Date Taking? Authorizing Provider  acetaminophen (TYLENOL) 500 MG tablet Take 1,000 mg by mouth every 6 (six) hours as needed for mild pain.   Yes Historical Provider, MD  Pseudoeph-Doxylamine-DM-APAP (NYQUIL PO) Take 30 mLs by mouth at bedtime as needed (cold symptoms).   Yes Historical Provider, MD  HYDROcodone-acetaminophen (NORCO) 7.5-325 MG per tablet Take 1 tablet by mouth every 4 (four) hours as needed for pain. Patient not taking: Reported on 10/09/2014 03/19/12   Ivery QualeHobson Lucretia Pendley, PA-C  meloxicam (MOBIC) 7.5 MG tablet 1 po bid with food Patient not taking: Reported on 10/09/2014 03/19/12   Ivery QualeHobson Tydus Sanmiguel, PA-C  methocarbamol (ROBAXIN) 500 MG tablet 2 po tid for spasm Patient not taking: Reported on 10/09/2014 03/19/12   Ivery QualeHobson Kristofor Michalowski, PA-C   BP 172/84 mmHg  Pulse 92  Temp(Src) 98 F (36.7 C) (Oral)  Resp 16  Ht 5\' 6"  (1.676 m)  Wt 204 lb (92.534 kg)  BMI 32.94 kg/m2  SpO2 96% Physical Exam  Constitutional: He is oriented to person, place, and time. He appears well-developed and well-nourished.  Non-toxic appearance.  HENT:  Head: Normocephalic.  Right Ear: Tympanic membrane and external ear normal.  Left Ear: Tympanic membrane and external ear normal.  Eyes: EOM and lids are normal. Pupils are  equal, round, and reactive to light.  Neck: Normal range of motion. Neck supple. Carotid bruit is not present.  Cardiovascular: Normal rate, regular rhythm, normal heart sounds, intact distal pulses and normal pulses.   Pulmonary/Chest: Breath sounds normal. No respiratory distress.  Abdominal: Soft. Bowel sounds are normal. There is no tenderness. There is no guarding.  Genitourinary:  There is a perineal abscess between the scrotal area  and the anal area. This is tender to palpation. There is no red streaks appreciated. Is no drainage appreciated at this time. The area is warm, but not hot.  There are skin tags from previous hemorrhoids at the anal area, but no active hemorrhoid at this time.  Musculoskeletal: Normal range of motion.  Lymphadenopathy:       Head (right side): No submandibular adenopathy present.       Head (left side): No submandibular adenopathy present.    He has no cervical adenopathy.  Neurological: He is alert and oriented to person, place, and time. He has normal strength. No cranial nerve deficit or sensory deficit.  Skin: Skin is warm and dry.  Psychiatric: He has a normal mood and affect. His speech is normal.  Nursing note and vitals reviewed.   ED Course  Procedures (including critical care time) Labs Review Labs Reviewed - No data to display  Imaging Review No results found.   EKG Interpretation None      MDM  Blood pressures 172/84, otherwise vital signs within normal limits. The examination favors a perineal abscess instead of a hemorrhoid. The patient is advised to soak in warm Epsom salt water 2 times daily. Prescription for doxycycline and Modic and Norco given to the patient. The patient is to follow with Dr. Delbert Harness in 3-4 days for recheck of this abscess area. He will return to the emergency department if any emergent changes before that time.    Final diagnoses:  None    *I have reviewed nursing notes, vital signs, and all appropriate lab and imaging results for this patient.[address] PA-C 10/09/14 1047  Blane Ohara, MD 10/09/14 [phone]

## 2014-10-09 NOTE — ED Notes (Signed)
Pt made aware to return if symptoms worsen or if any life threatening symptoms occur.   

## 2014-10-09 NOTE — ED Notes (Signed)
hemorriod pain since yesterday.

## 2014-10-09 NOTE — Discharge Instructions (Signed)
Please soak the abscess area in warm Epsom salt water for 15 minutes twice daily until it has resolved. Please call Dr. Delbert Harnesson Diego today for appointment on Friday or Monday, May 1 for recheck of this abscess area. Please use doxycycline and Modic 2 times daily with food. Please use Norco every 4 hours if needed for pain. Abscess An abscess (boil or furuncle) is an infected area on or under the skin. This area is filled with yellowish-white fluid (pus) and other material (debris). HOME CARE   Only take medicines as told by your doctor.  If you were given antibiotic medicine, take it as directed. Finish the medicine even if you start to feel better.  If gauze is used, follow your doctor's directions for changing the gauze.  To avoid spreading the infection:  Keep your abscess covered with a bandage.  Wash your hands well.  Do not share personal care items, towels, or whirlpools with others.  Avoid skin contact with others.  Keep your skin and clothes clean around the abscess.  Keep all doctor visits as told. GET HELP RIGHT AWAY IF:   You have more pain, puffiness (swelling), or redness in the wound site.  You have more fluid or blood coming from the wound site.  You have muscle aches, chills, or you feel sick.  You have a fever. MAKE SURE YOU:   Understand these instructions.  Will watch your condition.  Will get help right away if you are not doing well or get worse. Document Released: 11/18/2007 Document Revised: 12/01/2011 Document Reviewed: 08/14/2011 [facility] Patient Information 2015 [employer], [place]. This information is not intended to replace advice given to you by your health care provider. Make sure you discuss any questions you have with your health care provider.

## 2014-11-26 ENCOUNTER — Encounter (HOSPITAL_COMMUNITY)
Admission: EM | Disposition: A | Discharge status: Home / Self Care | Payer: BLUE CROSS/BLUE SHIELD | Source: Home / Self Care | Attending: Cardiology

## 2014-11-26 ENCOUNTER — Emergency Department (HOSPITAL_COMMUNITY): Payer: BLUE CROSS/BLUE SHIELD

## 2014-11-26 ENCOUNTER — Inpatient Hospital Stay (HOSPITAL_COMMUNITY)
Admission: EM | Admit: 2014-11-26 | Discharge: 2014-11-28 | DRG: 282 | Disposition: A | Discharge status: Home / Self Care | Payer: BLUE CROSS/BLUE SHIELD | Attending: Cardiology | Admitting: Cardiology

## 2014-11-26 ENCOUNTER — Encounter (HOSPITAL_COMMUNITY): Payer: Self-pay | Admitting: *Deleted

## 2014-11-26 DIAGNOSIS — I214 Non-ST elevation (NSTEMI) myocardial infarction: Principal | ICD-10-CM | POA: Diagnosis present

## 2014-11-26 DIAGNOSIS — R001 Bradycardia, unspecified: Secondary | ICD-10-CM | POA: Diagnosis present

## 2014-11-26 DIAGNOSIS — I272 Other secondary pulmonary hypertension: Secondary | ICD-10-CM | POA: Diagnosis present

## 2014-11-26 DIAGNOSIS — I2510 Atherosclerotic heart disease of native coronary artery without angina pectoris: Secondary | ICD-10-CM | POA: Diagnosis present

## 2014-11-26 DIAGNOSIS — Z79899 Other long term (current) drug therapy: Secondary | ICD-10-CM

## 2014-11-26 DIAGNOSIS — I340 Nonrheumatic mitral (valve) insufficiency: Secondary | ICD-10-CM | POA: Diagnosis present

## 2014-11-26 DIAGNOSIS — I10 Essential (primary) hypertension: Secondary | ICD-10-CM | POA: Diagnosis present

## 2014-11-26 DIAGNOSIS — I249 Acute ischemic heart disease, unspecified: Secondary | ICD-10-CM

## 2014-11-26 DIAGNOSIS — R079 Chest pain, unspecified: Secondary | ICD-10-CM

## 2014-11-26 DIAGNOSIS — Z8249 Family history of ischemic heart disease and other diseases of the circulatory system: Secondary | ICD-10-CM | POA: Diagnosis not present

## 2014-11-26 DIAGNOSIS — R778 Other specified abnormalities of plasma proteins: Secondary | ICD-10-CM

## 2014-11-26 DIAGNOSIS — E785 Hyperlipidemia, unspecified: Secondary | ICD-10-CM | POA: Diagnosis present

## 2014-11-26 DIAGNOSIS — F17210 Nicotine dependence, cigarettes, uncomplicated: Secondary | ICD-10-CM | POA: Diagnosis present

## 2014-11-26 DIAGNOSIS — R7989 Other specified abnormal findings of blood chemistry: Secondary | ICD-10-CM

## 2014-11-26 HISTORY — DX: Hyperlipidemia, unspecified: E78.5

## 2014-11-26 HISTORY — PX: CARDIAC CATHETERIZATION: SHX172

## 2014-11-26 HISTORY — DX: Atherosclerotic heart disease of native coronary artery without angina pectoris: I25.10

## 2014-11-26 HISTORY — DX: Nonrheumatic mitral (valve) insufficiency: I34.0

## 2014-11-26 LAB — CBC
HCT: 40.2 % (ref 39.0–52.0)
Hemoglobin: 13.3 g/dL (ref 13.0–17.0)
MCH: 30.8 pg (ref 26.0–34.0)
MCHC: 33.1 g/dL (ref 30.0–36.0)
MCV: 93.1 fL (ref 78.0–100.0)
Platelets: 233 10*3/uL (ref 150–400)
RBC: 4.32 MIL/uL (ref 4.22–5.81)
RDW: 14.3 % (ref 11.5–15.5)
WBC: 14.1 10*3/uL — AB (ref 4.0–10.5)

## 2014-11-26 LAB — CBC WITH DIFFERENTIAL/PLATELET
BASOS ABS: 0 10*3/uL (ref 0.0–0.1)
BASOS PCT: 0 % (ref 0–1)
EOS ABS: 0.1 10*3/uL (ref 0.0–0.7)
Eosinophils Relative: 1 % (ref 0–5)
HCT: 40.8 % (ref 39.0–52.0)
Hemoglobin: 13.7 g/dL (ref 13.0–17.0)
Lymphocytes Relative: 23 % (ref 12–46)
Lymphs Abs: 2.4 10*3/uL (ref 0.7–4.0)
MCH: 31.4 pg (ref 26.0–34.0)
MCHC: 33.6 g/dL (ref 30.0–36.0)
MCV: 93.4 fL (ref 78.0–100.0)
MONOS PCT: 6 % (ref 3–12)
Monocytes Absolute: 0.6 10*3/uL (ref 0.1–1.0)
NEUTROS PCT: 70 % (ref 43–77)
Neutro Abs: 7.1 10*3/uL (ref 1.7–7.7)
PLATELETS: 243 10*3/uL (ref 150–400)
RBC: 4.37 MIL/uL (ref 4.22–5.81)
RDW: 14.2 % (ref 11.5–15.5)
WBC: 10.2 10*3/uL (ref 4.0–10.5)

## 2014-11-26 LAB — BASIC METABOLIC PANEL
ANION GAP: 9 (ref 5–15)
BUN: 16 mg/dL (ref 6–20)
CHLORIDE: 101 mmol/L (ref 101–111)
CO2: 26 mmol/L (ref 22–32)
Calcium: 9.2 mg/dL (ref 8.9–10.3)
Creatinine, Ser: 0.8 mg/dL (ref 0.61–1.24)
GFR calc non Af Amer: >60 mL/min (ref >60)
Glucose, Bld: 134 mg/dL — ABNORMAL HIGH (ref 65–99)
POTASSIUM: 4.4 mmol/L (ref 3.5–5.1)
SODIUM: 136 mmol/L (ref 135–145)

## 2014-11-26 LAB — CREATININE, SERUM
Creatinine, Ser: 0.77 mg/dL (ref 0.61–1.24)
GFR calc non Af Amer: >60 mL/min (ref >60)

## 2014-11-26 LAB — PROTIME-INR
INR: 1.21 (ref 0.00–1.49)
Prothrombin Time: 15.4 seconds — ABNORMAL HIGH (ref 11.6–15.2)

## 2014-11-26 LAB — TROPONIN I: Troponin I: 1.54 ng/mL (ref <0.031)

## 2014-11-26 LAB — MRSA PCR SCREENING: MRSA BY PCR: NEGATIVE

## 2014-11-26 LAB — HEPARIN LEVEL (UNFRACTIONATED): Heparin Unfractionated: 0.25 IU/mL — ABNORMAL LOW (ref 0.30–0.70)

## 2014-11-26 SURGERY — LEFT HEART CATH AND CORONARY ANGIOGRAPHY

## 2014-11-26 MED ORDER — ASPIRIN 325 MG PO TABS
325.0000 mg | ORAL_TABLET | Freq: Once | ORAL | Status: AC
  Administered 2014-11-26: 325 mg via ORAL
  Filled 2014-11-26: qty 1

## 2014-11-26 MED ORDER — IOHEXOL 350 MG/ML SOLN
100.0000 mL | Freq: Once | INTRAVENOUS | Status: AC | PRN
  Administered 2014-11-26: 100 mL via INTRAVENOUS

## 2014-11-26 MED ORDER — MORPHINE SULFATE 2 MG/ML IJ SOLN
2.0000 mg | INTRAMUSCULAR | Status: DC | PRN
  Administered 2014-11-26 (×2): 2 mg via INTRAVENOUS
  Filled 2014-11-26 (×2): qty 1

## 2014-11-26 MED ORDER — HEPARIN SODIUM (PORCINE) 5000 UNIT/ML IJ SOLN
5000.0000 [IU] | Freq: Three times a day (TID) | INTRAMUSCULAR | Status: DC
  Administered 2014-11-27 – 2014-11-28 (×4): 5000 [IU] via SUBCUTANEOUS
  Filled 2014-11-26 (×6): qty 1

## 2014-11-26 MED ORDER — METOPROLOL TARTRATE 12.5 MG HALF TABLET
12.5000 mg | ORAL_TABLET | Freq: Two times a day (BID) | ORAL | Status: DC
  Administered 2014-11-26 – 2014-11-28 (×4): 12.5 mg via ORAL
  Filled 2014-11-26 (×5): qty 1

## 2014-11-26 MED ORDER — SODIUM CHLORIDE 0.9 % WEIGHT BASED INFUSION: 3.0000 mL/kg/h | INTRAVENOUS | Status: DC

## 2014-11-26 MED ORDER — ONDANSETRON HCL 4 MG/2ML IJ SOLN
INTRAMUSCULAR | Status: AC
  Filled 2014-11-26: qty 2

## 2014-11-26 MED ORDER — MORPHINE SULFATE 4 MG/ML IJ SOLN
4.0000 mg | Freq: Once | INTRAMUSCULAR | Status: AC
  Administered 2014-11-26: 4 mg via INTRAVENOUS
  Filled 2014-11-26: qty 1

## 2014-11-26 MED ORDER — SODIUM CHLORIDE 0.9 % IJ SOLN
3.0000 mL | Freq: Two times a day (BID) | INTRAMUSCULAR | Status: DC
  Administered 2014-11-27 (×2): 3 mL via INTRAVENOUS

## 2014-11-26 MED ORDER — SODIUM CHLORIDE 0.9 % IV SOLN: 250.0000 mL | INTRAVENOUS | Status: DC | PRN

## 2014-11-26 MED ORDER — ASPIRIN 81 MG PO CHEW: 81.0000 mg | CHEWABLE_TABLET | ORAL | Status: DC

## 2014-11-26 MED ORDER — IOHEXOL 350 MG/ML SOLN
INTRAVENOUS | Status: DC | PRN
  Administered 2014-11-26: 80 mL via INTRA_ARTERIAL

## 2014-11-26 MED ORDER — HEPARIN (PORCINE) IN NACL 100-0.45 UNIT/ML-% IJ SOLN
1150.0000 [IU]/h | INTRAMUSCULAR | Status: DC
  Administered 2014-11-26: 1000 [IU]/h via INTRAVENOUS
  Filled 2014-11-26: qty 250

## 2014-11-26 MED ORDER — SODIUM CHLORIDE 0.9 % WEIGHT BASED INFUSION: 1.0000 mL/kg/h | INTRAVENOUS | Status: AC

## 2014-11-26 MED ORDER — NITROGLYCERIN 1 MG/10 ML FOR IR/CATH LAB
INTRA_ARTERIAL | Status: AC
  Filled 2014-11-26: qty 10

## 2014-11-26 MED ORDER — ACETAMINOPHEN 325 MG PO TABS
650.0000 mg | ORAL_TABLET | ORAL | Status: DC | PRN
  Administered 2014-11-27 (×2): 650 mg via ORAL
  Filled 2014-11-26 (×2): qty 2

## 2014-11-26 MED ORDER — SODIUM CHLORIDE 0.9 % WEIGHT BASED INFUSION: 1.0000 mL/kg/h | INTRAVENOUS | Status: DC

## 2014-11-26 MED ORDER — LIDOCAINE HCL (PF) 1 % IJ SOLN
INTRAMUSCULAR | Status: AC
  Filled 2014-11-26: qty 30

## 2014-11-26 MED ORDER — HEPARIN SODIUM (PORCINE) 1000 UNIT/ML IJ SOLN
INTRAMUSCULAR | Status: AC
  Filled 2014-11-26: qty 1

## 2014-11-26 MED ORDER — SODIUM CHLORIDE 0.9 % IJ SOLN
3.0000 mL | INTRAMUSCULAR | Status: DC | PRN
Start: 2014-11-26 — End: 2014-11-26

## 2014-11-26 MED ORDER — VERAPAMIL HCL 2.5 MG/ML IV SOLN
INTRAVENOUS | Status: DC | PRN
  Administered 2014-11-26: 17:00:00 via INTRA_ARTERIAL

## 2014-11-26 MED ORDER — HEPARIN (PORCINE) IN NACL 2-0.9 UNIT/ML-% IJ SOLN
INTRAMUSCULAR | Status: AC
  Filled 2014-11-26: qty 1000

## 2014-11-26 MED ORDER — SODIUM CHLORIDE 0.9 % IJ SOLN: 3.0000 mL | Freq: Two times a day (BID) | INTRAMUSCULAR | Status: DC

## 2014-11-26 MED ORDER — HEPARIN BOLUS VIA INFUSION
4000.0000 [IU] | Freq: Once | INTRAVENOUS | Status: AC
  Administered 2014-11-26: 4000 [IU] via INTRAVENOUS

## 2014-11-26 MED ORDER — KETOROLAC TROMETHAMINE 30 MG/ML IJ SOLN
30.0000 mg | Freq: Once | INTRAMUSCULAR | Status: AC
  Administered 2014-11-26: 30 mg via INTRAVENOUS
  Filled 2014-11-26: qty 1

## 2014-11-26 MED ORDER — ATORVASTATIN CALCIUM 80 MG PO TABS
80.0000 mg | ORAL_TABLET | Freq: Every day | ORAL | Status: DC
  Administered 2014-11-26 – 2014-11-27 (×2): 80 mg via ORAL
  Filled 2014-11-26 (×3): qty 1

## 2014-11-26 MED ORDER — HEPARIN SODIUM (PORCINE) 1000 UNIT/ML IJ SOLN
INTRAMUSCULAR | Status: DC | PRN
  Administered 2014-11-26: 4500 [IU] via INTRAVENOUS

## 2014-11-26 MED ORDER — ONDANSETRON HCL 4 MG/2ML IJ SOLN
4.0000 mg | Freq: Four times a day (QID) | INTRAMUSCULAR | Status: DC | PRN
  Administered 2014-11-26: 4 mg via INTRAVENOUS
  Filled 2014-11-26: qty 2

## 2014-11-26 MED ORDER — ALUM & MAG HYDROXIDE-SIMETH 200-200-20 MG/5ML PO SUSP
30.0000 mL | ORAL | Status: DC | PRN
  Filled 2014-11-26: qty 30

## 2014-11-26 MED ORDER — ASPIRIN 81 MG PO CHEW
81.0000 mg | CHEWABLE_TABLET | Freq: Every day | ORAL | Status: DC
  Administered 2014-11-27 – 2014-11-28 (×2): 81 mg via ORAL
  Filled 2014-11-26 (×2): qty 1

## 2014-11-26 MED ORDER — ONDANSETRON HCL 4 MG/2ML IJ SOLN
4.0000 mg | Freq: Once | INTRAMUSCULAR | Status: AC
  Administered 2014-11-26: 4 mg via INTRAVENOUS

## 2014-11-26 MED ORDER — CLOPIDOGREL BISULFATE 75 MG PO TABS
75.0000 mg | ORAL_TABLET | Freq: Every day | ORAL | Status: DC
  Administered 2014-11-27 – 2014-11-28 (×2): 75 mg via ORAL
  Filled 2014-11-26 (×2): qty 1

## 2014-11-26 MED ORDER — VERAPAMIL HCL 2.5 MG/ML IV SOLN
INTRAVENOUS | Status: AC
  Filled 2014-11-26: qty 2

## 2014-11-26 MED ORDER — SODIUM CHLORIDE 0.9 % IJ SOLN: 3.0000 mL | INTRAMUSCULAR | Status: DC | PRN

## 2014-11-26 SURGICAL SUPPLY — 11 items

## 2014-11-26 NOTE — ED Provider Notes (Signed)
CSN: [id number]     Arrival date & time 11/26/14  0865 History   First MD Initiated Contact with Patient 11/26/14 [phone]     Chief Complaint  Patient presents with  . Chest Pain      The history is provided by the patient and medical records.   Patient reports right-sided chest pain over the past several days.  He states this is intermittent but worse when he takes a deep breath.  He states when he takes a deep breath he feels more short of breath as well.  He reports this began shortly after burning sulfur in the fields.  He reports since last night he has had some movement of the chest discomfort over to the left chest.  His discomfort has been more constant now.  He has mild shortness of breath at this time.  No fevers or chills.  No productive cough.  No prior history of similar symptoms.  He does smoke and has a history of hypertension hyperlipidemia.  No diabetes.  He does have a family history of heart disease.  He's never been evaluated for chest pain and never had a cardiac workup.  He's never had discomfort or pain like this before.  He still having ongoing discomfort at this time.  He describes the majority of his pain in his right chest.  He denies neck pain or jaw pain.  No back pain.  Denies abdominal pain.  He reports some nausea when pain becomes severe.  No diaphoresis described   Past Medical History  Diagnosis Date  . Hypertension   . Bronchitis    Past Surgical History  Procedure Laterality Date  . Neck surgery     No family history on file. History  Substance Use Topics  . Smoking status: Current Every Day Smoker    Types: Cigarettes  . Smokeless tobacco: Not on file  . Alcohol Use: Yes     Comment: occ    Review of Systems  All other systems reviewed and are negative.     Allergies  Review of patient's allergies indicates no known allergies.  Home Medications   Prior to Admission medications   Medication Sig Start Date End Date Taking? Authorizing  Provider  methocarbamol (ROBAXIN) 500 MG tablet 2 po tid for spasm 03/19/12  Yes Ivery Quale, PA-C  acetaminophen (TYLENOL) 500 MG tablet Take 1,000 mg by mouth every 6 (six) hours as needed for mild pain.    Historical Provider, MD  doxycycline (VIBRAMYCIN) 100 MG capsule Take 1 capsule (100 mg total) by mouth 2 (two) times daily. 10/09/14   Ivery Quale, PA-C  HYDROcodone-acetaminophen (NORCO/VICODIN) 5-325 MG per tablet Take 1-2 tablets by mouth every 4 (four) hours as needed. 10/09/14   Ivery Quale, PA-C  meloxicam (MOBIC) 7.5 MG tablet 1 po bid with food 10/09/14   Ivery Quale, PA-C  Pseudoeph-Doxylamine-DM-APAP (NYQUIL PO) Take 30 mLs by mouth at bedtime as needed (cold symptoms).    Historical Provider, MD   BP 103/64 mmHg  Pulse 54  Temp(Src) 98.5 F (36.9 C) (Oral)  Resp 20  Ht  (1.676 m)  Wt 205 lb (92.987 kg)  BMI 33.10 kg/m2  SpO2 99% Physical Exam  Constitutional: He is oriented to person, place, and time. He appears well-developed and well-nourished.  HENT:  Head: Normocephalic and atraumatic.  Eyes: EOM are normal.  Neck: Normal range of motion.  Cardiovascular: Normal rate, regular rhythm, normal heart sounds and intact distal pulses.   Pulmonary/Chest:  Effort normal and breath sounds normal. No respiratory distress.  Abdominal: Soft. He exhibits no distension. There is no tenderness.  Musculoskeletal: Normal range of motion.  Neurological: He is alert and oriented to person, place, and time.  Skin: Skin is warm and dry.  Psychiatric: He has a normal mood and affect. Judgment normal.  Nursing note and vitals reviewed.   ED Course  Procedures (including critical care time)  CRITICAL CARE Performed by: Lyanne Co Total critical care time: 33 Critical care time was exclusive of separately billable procedures and treating other patients. Critical care was necessary to treat or prevent imminent or life-threatening deterioration. Critical care was time  spent personally by me on the following activities: development of treatment plan with patient and/or surrogate as well as nursing, discussions with consultants, evaluation of patient's response to treatment, examination of patient, obtaining history from patient or surrogate, ordering and performing treatments and interventions, ordering and review of laboratory studies, ordering and review of radiographic studies, pulse oximetry and re-evaluation of patient's condition.   Labs Review Labs Reviewed  BASIC METABOLIC PANEL - Abnormal; Notable for the following:    Glucose, Bld 134 (*)    All other components within normal limits  TROPONIN I - Abnormal; Notable for the following:    Troponin I 1.54 (*)    All other components within normal limits  CBC WITH DIFFERENTIAL/PLATELET    Imaging Review Dg Chest 2 View  11/26/2014   CLINICAL DATA:  Chest pain.  EXAM: CHEST  2 VIEW  COMPARISON:  11/02/2009 and 11/26/2003  FINDINGS: The heart size and mediastinal contours are within normal limits.  There is a small vague area of increased density at the right lung base which may represent a confluence of normal structures. Lungs are otherwise clear. No acute osseous abnormality.  IMPRESSION: Small vague density at the right lung base on the PA view. I recommend a repeat PA view, slightly oblique for further evaluation.   Electronically Signed   By: Francene Boyers M.D.   On: 11/26/2014 08:02   Ct Angio Chest Pe W/cm &/or Wo Cm  11/26/2014   CLINICAL DATA:  Mid chest pain for 3 days.  Question PE.  EXAM: CT ANGIOGRAPHY CHEST WITH CONTRAST  TECHNIQUE: Multidetector CT imaging of the chest was performed using the standard protocol during bolus administration of intravenous contrast. Multiplanar CT image reconstructions and MIPs were obtained to evaluate the vascular anatomy.  CONTRAST:  OMNIPAQUE IOHEXOL 350 MG/ML SOLN  COMPARISON:  None.  FINDINGS: Mediastinum/Nodes: Negative for pulmonary embolus.  Mediastinal lymph nodes are not enlarged by CT size criteria. No hilar or axillary adenopathy atherosclerotic calcification of the arterial vasculature. Pulmonary arteries are slightly enlarged. Heart is normal in size. No pericardial effusion.  Lungs/Pleura: Mild upper lung zone predominant centrilobular nodularity, indicative of respiratory bronchiolitis in this patient who is a current everyday smoker. Trace emphysematous change at the apices. No pleural fluid. Airway is unremarkable.  Upper abdomen: Visualized portion of the liver appears slightly decreased in attenuation diffusely. Visualized portions of the adrenal glands, kidneys, spleen, pancreas and stomach are grossly unremarkable. No upper abdominal adenopathy.  Musculoskeletal: No worrisome lytic or sclerotic lesions. Degenerative changes are seen in the spine.  Review of the MIP images confirms the above findings.  IMPRESSION: 1. Negative for pulmonary embolus. 2. Pulmonary arteries are slightly enlarged, indicative of pulmonary arterial hypertension.   Electronically Signed   By: Leanna Battles M.D.   On: 11/26/2014 09:16  I  personally reviewed the imaging tests through PACS system I reviewed available ER/hospitalization records through the EMR   ECG interpretation #1  Date: 11/26/2014  Rate: 52  Rhythm: normal sinus rhythm  QRS Axis: normal  Intervals: normal  ST/T Wave abnormalities: Nonspecific ST changes in lead III  Conduction Disutrbances: none  Narrative Interpretation:   Old EKG Reviewed: changed from ecg in 2005     EKG Interpretation #2 Date/Time:  Monday November 26 2014 07:20:03 EDT Ventricular Rate:  45 PR Interval:  84 QRS Duration: 95 QT Interval:  466 QTC Calculation: 403 R Axis:   50 Text Interpretation:  Sinus bradycardia Short PR interval Probable left  ventricular hypertrophy Nonspecific ST abnormality Confirmed by Azavion Bouillon   MD, Conan Mcmanaway ([id number]) on 11/26/2014 8:55:18 AM      MDM   Final diagnoses:  Chest  pain, unspecified chest pain type  Elevated troponin  Acute coronary syndrome    Initially the patient was describing some right-sided pleuritic pain and he did have an abnormal chest x-ray as well as what sounded like a rub in his right lower lung.  Thus the patient was worked up for pulmonary embolism.  His CT scan is negative for PE and therefore given his risk factors one has to consider this as more of an acute coronary syndrome.  His pain is improving at this time.  He has nonspecific ST segment isolated to lead III, but with 2 EKGs here in the emergency department this does not seem to be changing.  There are no other signs of ischemia.  I suspect this is a non-ST elevation MI. Started on ASA and heparin.   I will discuss the case with cardiology at [facility] and expect the patient to be transferred down there for more definitive care.  The patient will benefit from heart catheterization.  Exertion does seem to worsen the discomfort.  His discomfort does seem to improve with rest.  9:43 AM Spoke with Dr Delton See, cardiology at [facility] who accepts the pt in transfer    Azalia Bilis, MD 11/26/14 [phone]

## 2014-11-26 NOTE — H&P (Signed)
Patient ID: Rodney Mahoney MRN: [id number], DOB/AGE: 1960-06-16   Admit date: 11/26/2014  Primary Physician: Isabella Stalling, MD Primary Cardiologist: Karsten Ro H  Pt. Profile:  NSTEMI  Problem List  Past Medical History  Diagnosis Date  . Hypertension   . Bronchitis     Past Surgical History  Procedure Laterality Date  . Neck surgery      Allergies  No Known Allergies   HPI  Patient is a 54 y.o. male with a PMHx of Patient reports right-sided chest pain over the past several days. He states this is intermittent but worse when he works.His risk factors include hypertension, hyperlipidemia, ongoing smoking - 1 PPD, significant FH of premature CAD (father died of MI in his 35'). The patient states that he had intermittent exertional chest pains in te past but didn't seek medical attention. He ruled in for MI, was started on iv Heparin drip and transferred from [facility] ER to [facility] for a left cardiac cath. He is currently chest pain free.  He has no prior h/o heart disease and haven't seen a cardiologist in the past.  Home Medications  Prior to Admission medications   Medication Sig Start Date End Date Taking? Authorizing Provider  acetaminophen (TYLENOL) 500 MG tablet Take 500 mg by mouth every 6 (six) hours as needed.   Yes Historical Provider, MD  amLODipine (NORVASC) 5 MG tablet  10/25/14  Yes Historical Provider, MD  diphenhydrAMINE (BENADRYL) 25 MG tablet Take 25 mg by mouth every 6 (six) hours as needed.   Yes Historical Provider, MD  methocarbamol (ROBAXIN) 500 MG tablet 2 po tid for spasm 03/19/12  Yes Ivery Quale, PA-C  pravastatin (PRAVACHOL) 40 MG tablet  10/25/14  Yes Historical Provider, MD  acetaminophen (TYLENOL) 500 MG tablet Take 1,000 mg by mouth every 6 (six) hours as needed for mild pain.    Historical Provider, MD  doxycycline (VIBRAMYCIN) 100 MG capsule Take 1 capsule (100 mg total) by mouth 2 (two) times daily. 10/09/14   Ivery Quale, PA-C  HYDROcodone-acetaminophen (NORCO/VICODIN) 5-325 MG per tablet Take 1-2 tablets by mouth every 4 (four) hours as needed. 10/09/14   Ivery Quale, PA-C  meloxicam (MOBIC) 7.5 MG tablet 1 po bid with food 10/09/14   Ivery Quale, PA-C  Pseudoeph-Doxylamine-DM-APAP (NYQUIL PO) Take 30 mLs by mouth at bedtime as needed (cold symptoms).    Historical Provider, MD     Family History  No family history on file.  Social History  History   Social History  . Marital Status: Married    Spouse Name: N/A  . Number of Children: N/A  . Years of Education: N/A   Occupational History  . Not on file.   Social History Main Topics  . Smoking status: Current Every Day Smoker    Types: Cigarettes  . Smokeless tobacco: Not on file  . Alcohol Use: Yes     Comment: occ  . Drug Use: No  . Sexual Activity: Yes    Birth Control/ Protection: None   Other Topics Concern  . Not on file   Social History Narrative    Review of Systems General:  No chills, fever, night sweats or weight changes.  Cardiovascular:  No chest pain, dyspnea on exertion, edema, orthopnea, palpitations, paroxysmal nocturnal dyspnea. Dermatological: No rash, lesions/masses Respiratory: No cough, dyspnea Urologic: No hematuria, dysuria Abdominal:   No nausea, vomiting, diarrhea, bright red blood per rectum, melena, or hematemesis Neurologic:  No visual changes, wkns,  changes in mental status. All other systems reviewed and are otherwise negative except as noted above.  Physical Exam  Blood pressure 132/53, pulse 47, temperature 98.5 F (36.9 C), temperature source Oral, resp. rate 20, height  (1.676 m), weight 205 lb (92.987 kg), SpO2 100 %.  General: Pleasant, NAD Psych: Normal affect. Neuro: Alert and oriented X 3. Moves all extremities spontaneously. HEENT: Normal  Neck: Supple without bruits or JVD. Lungs:  Resp regular and unlabored, CTA. Heart: RRR no s3, s4, or murmurs. Abdomen: Soft,  non-tender, non-distended, BS + x 4.  Extremities: No clubbing, cyanosis or edema. DP/PT/Radials 2+ and equal bilaterally.  Labs  Recent Labs  11/26/14 0709  TROPONINI 1.54*   Lab Results  Component Value Date   WBC 10.2 11/26/2014   HGB 13.7 11/26/2014   HCT 40.8 11/26/2014   MCV 93.4 11/26/2014   PLT 243 11/26/2014    Recent Labs Lab 11/26/14 0709  NA 136  K 4.4  CL 101  CO2 26  BUN 16  CREATININE 0.80  CALCIUM 9.2  GLUCOSE 134*   Radiology/Studies  Dg Chest 2 View  11/26/2014   CLINICAL DATA:  Chest pain.  EXAM: CHEST  2 VIEW  COMPARISON:  11/02/2009 and 11/26/2003  FINDINGS: The heart size and mediastinal contours are within normal limits.  There is a small vague area of increased density at the right lung base which may represent a confluence of normal structures. Lungs are otherwise clear. No acute osseous abnormality.  IMPRESSION: Small vague density at the right lung base on the PA view. I recommend a repeat PA view, slightly oblique for further evaluation.     Ct Angio Chest Pe W/cm &/or Wo Cm  11/26/2014   CLINICAL DATA:  Mid chest pain for 3 days.  Question PE.   IMPRESSION: 1. Negative for pulmonary embolus. 2. Pulmonary arteries are slightly enlarged, indicative of pulmonary arterial hypertension.    Echocardiogram - none  ECG: Sinus bradycardia, negative T waves in III, V5-6    ASSESSMENT AND PLAN  54 year old male   1. NSTEMI - on Heparin drip, we will start aspirin, atorvastatin 80 mg po daily, no BB as he has bradycardia at baseline.  He is scheduled for a left cardiac cath later today. - we will order an echocardiogram  2. HTN - controlled - on amlodipine 2.5 mg po daily  3. Hyperlipidemia - at home on pravastatin 40 mg po daily, we will switch to atorvastatin 80 mg po daily.  4. Smoking cessation - discussed, he is motivated to stop  DVT PPX - Heparin drip   Signed, Lars Masson, MD, [facility] 11/26/2014, 12:27 PM

## 2014-11-26 NOTE — ED Notes (Signed)
[facility] here for transport.  

## 2014-11-26 NOTE — Progress Notes (Signed)
ANTICOAGULATION CONSULT NOTE - Initial Consult  Pharmacy Consult for Heparin Indication: chest pain/ACS  No Known Allergies  Patient Measurements: Height: 5\' 6"  (167.6 cm) Weight: 205 lb (92.987 kg) IBW/kg (Calculated) : 63.8 HEPARIN DW (KG): 83.7   Vital Signs: Temp: 98.5 F (36.9 C) (06/13 0646) Temp Source: Oral (06/13 0646) BP: 118/75 mmHg (06/13 0915) Pulse Rate: 67 (06/13 0915)  Labs:  Recent Labs  11/26/14 0709  HGB 13.7  HCT 40.8  PLT 243  CREATININE 0.80  TROPONINI 1.54*    Estimated Creatinine Clearance: 112.7 mL/min (by C-G formula based on Cr of 0.8).   Medical History: Past Medical History  Diagnosis Date  . Hypertension   . Bronchitis     Medications:  Scheduled:   Assessment: 54 yo admitted with chest pain.  EKG changes with elevated troponin noted.  CBC reviewed. No bleeding noted.  Cardiology consult pending.   Goal of Therapy:  Heparin level 0.3-0.7 units/ml Monitor platelets by anticoagulation protocol: Yes   Plan:  Give 4000 units bolus x 1 Start heparin infusion at 1000 units/hr Check anti-Xa level in 6 hours and daily while on heparin Continue to monitor H&H and platelets  Elson Clan 11/26/2014,9:21 AM

## 2014-11-26 NOTE — ED Notes (Signed)
Pt reports chest pain that started 3 days ago after burning sulfur.

## 2014-11-26 NOTE — Progress Notes (Signed)
Patient transferred from [facility] to 2H17. Patient with no complaints at the current time. Awaiting MD consult.

## 2014-11-26 NOTE — ED Notes (Signed)
MD at bedside. 

## 2014-11-26 NOTE — Interval H&P Note (Signed)
History and Physical Interval Note:  11/26/2014 5:09 PM  Rodney Mahoney  has presented today for surgery, with the diagnosis of non stemi  The various methods of treatment have been discussed with the patient and family. After consideration of risks, benefits and other options for treatment, the patient has consented to  Procedure(s): Left Heart Cath and Coronary Angiography (N/A) as a surgical intervention .  The patient's history has been reviewed, patient examined, no change in status, stable for surgery.  I have reviewed the patient's chart and labs.  Questions were answered to the patient's satisfaction.   Cath Lab Visit (complete for each Cath Lab visit)  Clinical Evaluation Leading to the Procedure:   ACS: Yes.    Non-ACS:    Anginal Classification: CCS IV  Anti-ischemic medical therapy: Minimal Therapy (1 class of medications)  Non-Invasive Test Results: No non-invasive testing performed  Prior CABG: No previous CABG        Theron Arista [facility] 11/26/2014 5:09 PM

## 2014-11-26 NOTE — Progress Notes (Signed)
ANTICOAGULATION CONSULT NOTE   Pharmacy Consult for Heparin Indication: chest pain/ACS  No Known Allergies  Patient Measurements: Height: 5\' 6"  (167.6 cm) Weight: 205 lb (92.987 kg) IBW/kg (Calculated) : 63.8 HEPARIN DW (KG): 83.7   Vital Signs: Temp: 98.5 F (36.9 C) (06/13 0646) Temp Source: Oral (06/13 1200) BP: 132/53 mmHg (06/13 1200) Pulse Rate: 47 (06/13 1200)  Labs:  Recent Labs  11/26/14 0709 11/26/14 1501  HGB 13.7  --   HCT 40.8  --   PLT 243  --   LABPROT  --  15.4*  INR  --  1.21  HEPARINUNFRC  --  0.25*  CREATININE 0.80  --   TROPONINI 1.54*  --     Estimated Creatinine Clearance: 112.7 mL/min (by C-G formula based on Cr of 0.8).   Medical History: Past Medical History  Diagnosis Date  . Hypertension   . Bronchitis    Assessment: 54 yo admitted with chest pain.  EKG changes with elevated troponin noted.  CBC reviewed. No bleeding noted.   Initial heparin level low at 0.24, will increase rate without bolus given possible cath shortly.  Goal of Therapy:  Heparin level 0.3-0.7 units/ml Monitor platelets by anticoagulation protocol: Yes   Plan:  Increase heparin to 1150 units/hr Follow up post cath Continue to monitor H&H and platelets  Sheppard Coil PharmD., BCPS Clinical Pharmacist Pager [phone] 11/26/2014 3:43 PM

## 2014-11-26 NOTE — Progress Notes (Signed)
Cardiac Fellow on call in house made aware of patients c/o nausea with large amount of undigested emesis and c/o chest pain, ECG obtained and viewed by MD. Zofran was given and MD to order morphine, will administer to patient.

## 2014-11-27 ENCOUNTER — Inpatient Hospital Stay (HOSPITAL_COMMUNITY): Payer: BLUE CROSS/BLUE SHIELD

## 2014-11-27 ENCOUNTER — Encounter (HOSPITAL_COMMUNITY): Payer: Self-pay | Admitting: Cardiology

## 2014-11-27 ENCOUNTER — Other Ambulatory Visit (HOSPITAL_COMMUNITY): Payer: BLUE CROSS/BLUE SHIELD

## 2014-11-27 DIAGNOSIS — R079 Chest pain, unspecified: Secondary | ICD-10-CM

## 2014-11-27 LAB — CBC
HCT: 36.8 % — ABNORMAL LOW (ref 39.0–52.0)
Hemoglobin: 12.2 g/dL — ABNORMAL LOW (ref 13.0–17.0)
MCH: 30.6 pg (ref 26.0–34.0)
MCHC: 33.2 g/dL (ref 30.0–36.0)
MCV: 92.2 fL (ref 78.0–100.0)
PLATELETS: 238 10*3/uL (ref 150–400)
RBC: 3.99 MIL/uL — AB (ref 4.22–5.81)
RDW: 14.5 % (ref 11.5–15.5)
WBC: 13.6 10*3/uL — ABNORMAL HIGH (ref 4.0–10.5)

## 2014-11-27 MED ORDER — METOPROLOL TARTRATE 12.5 MG HALF TABLET: 12.5000 mg | ORAL_TABLET | Freq: Two times a day (BID) | ORAL | Status: DC

## 2014-11-27 MED ORDER — LISINOPRIL 2.5 MG PO TABS
2.5000 mg | ORAL_TABLET | Freq: Every day | ORAL | Status: DC
  Administered 2014-11-27 – 2014-11-28 (×2): 2.5 mg via ORAL
  Filled 2014-11-27 (×2): qty 1

## 2014-11-27 MED FILL — Lidocaine HCl Local Preservative Free (PF) Inj 1%: INTRAMUSCULAR | Qty: 30 | Status: AC

## 2014-11-27 MED FILL — Heparin Sodium (Porcine) 2 Unit/ML in Sodium Chloride 0.9%: INTRAMUSCULAR | Qty: 1000 | Status: AC

## 2014-11-27 NOTE — Progress Notes (Signed)
Subjective:  No further CP. S/P NSTEMI, diagnostic radial cath  Objective:  Temp:  [97.7 F (36.5 C)-98.9 F (37.2 C)] 98.9 F (37.2 C) (06/14 0824) Pulse Rate:  [0-70] 64 (06/14 0824) Resp:  [12-30] 21 (06/14 0700) BP: (85-132)/(45-89) 121/70 mmHg (06/14 0700) SpO2:  [92 %-100 %] 92 % (06/14 0700) Weight change:   Intake/Output from previous day: 06/13 0701 - 06/14 0700 In: 1321.5 [P.O.:330; I.V.:991.5] Out: 1550 [Urine:1550]  Intake/Output from this shift:    Physical Exam: General appearance: alert and no distress Neck: no adenopathy, no carotid bruit, no JVD, supple, symmetrical, trachea midline and thyroid not enlarged, symmetric, no tenderness/mass/nodules Lungs: clear to auscultation bilaterally Heart: regular rate and rhythm, S1, S2 normal, no murmur, click, rub or gallop Extremities: extremities normal, atraumatic, no cyanosis or edema  Lab Results: Results for orders placed or performed during the hospital encounter of 11/26/14 (from the past 48 hour(s))  CBC with Differential     Status: None   Collection Time: 11/26/14  7:09 AM  Result Value Ref Range   WBC 10.2 4.0 - 10.5 K/uL   RBC 4.37 4.22 - 5.81 MIL/uL   Hemoglobin 13.7 13.0 - 17.0 g/dL   HCT 40.8 39.0 - 52.0 %   MCV 93.4 78.0 - 100.0 fL   MCH 31.4 26.0 - 34.0 pg   MCHC 33.6 30.0 - 36.0 g/dL   RDW 14.2 11.5 - 15.5 %   Platelets 243 150 - 400 K/uL   Neutrophils Relative % 70 43 - 77 %   Neutro Abs 7.1 1.7 - 7.7 K/uL   Lymphocytes Relative 23 12 - 46 %   Lymphs Abs 2.4 0.7 - 4.0 K/uL   Monocytes Relative 6 3 - 12 %   Monocytes Absolute 0.6 0.1 - 1.0 K/uL   Eosinophils Relative 1 0 - 5 %   Eosinophils Absolute 0.1 0.0 - 0.7 K/uL   Basophils Relative 0 0 - 1 %   Basophils Absolute 0.0 0.0 - 0.1 K/uL  Basic metabolic panel     Status: Abnormal   Collection Time: 11/26/14  7:09 AM  Result Value Ref Range   Sodium 136 135 - 145 mmol/L   Potassium 4.4 3.5 - 5.1 mmol/L   Chloride 101 101 -  111 mmol/L   CO2 26 22 - 32 mmol/L   Glucose, Bld 134 (H) 65 - 99 mg/dL   BUN 16 6 - 20 mg/dL   Creatinine, Ser 0.80 0.61 - 1.24 mg/dL   Calcium 9.2 8.9 - 10.3 mg/dL   GFR calc non Af Amer >60 >60 mL/min   GFR calc Af Amer >60 >60 mL/min    Comment: (NOTE) The eGFR has been calculated using the CKD EPI equation. This calculation has not been validated in all clinical situations. eGFR's persistently <60 mL/min signify possible Chronic Kidney Disease.    Anion gap 9 5 - 15  Troponin I     Status: Abnormal   Collection Time: 11/26/14  7:09 AM  Result Value Ref Range   Troponin I 1.54 (HH) <0.031 ng/mL    Comment:        POSSIBLE MYOCARDIAL ISCHEMIA. SERIAL TESTING RECOMMENDED. CRITICAL RESULT CALLED TO, READ BACK BY AND VERIFIED WITH: CREWS,J ON 11/26/14 @ [id number] BY MITCHELL,S   MRSA PCR Screening     Status: None   Collection Time: 11/26/14 12:03 PM  Result Value Ref Range   MRSA by PCR NEGATIVE NEGATIVE    Comment:  The GeneXpert MRSA Assay (FDA approved for NASAL specimens only), is one component of a comprehensive MRSA colonization surveillance program. It is not intended to diagnose MRSA infection nor to guide or monitor treatment for MRSA infections.   Protime-INR     Status: Abnormal   Collection Time: 11/26/14  3:01 PM  Result Value Ref Range   Prothrombin Time 15.4 (H) 11.6 - 15.2 seconds   INR 1.21 0.00 - 1.49  Heparin level (unfractionated)     Status: Abnormal   Collection Time: 11/26/14  3:01 PM  Result Value Ref Range   Heparin Unfractionated 0.25 (L) 0.30 - 0.70 IU/mL    Comment:        IF HEPARIN RESULTS ARE BELOW EXPECTED VALUES, AND PATIENT DOSAGE HAS BEEN CONFIRMED, SUGGEST FOLLOW UP TESTING OF ANTITHROMBIN III LEVELS.   CBC     Status: Abnormal   Collection Time: 11/26/14  7:32 PM  Result Value Ref Range   WBC 14.1 (H) 4.0 - 10.5 K/uL   RBC 4.32 4.22 - 5.81 MIL/uL   Hemoglobin 13.3 13.0 - 17.0 g/dL   HCT 40.2 39.0 - 52.0 %   MCV 93.1  78.0 - 100.0 fL   MCH 30.8 26.0 - 34.0 pg   MCHC 33.1 30.0 - 36.0 g/dL   RDW 14.3 11.5 - 15.5 %   Platelets 233 150 - 400 K/uL  Creatinine, serum     Status: None   Collection Time: 11/26/14  7:32 PM  Result Value Ref Range   Creatinine, Ser 0.77 0.61 - 1.24 mg/dL   GFR calc non Af Amer >60 >60 mL/min   GFR calc Af Amer >60 >60 mL/min    Comment: (NOTE) The eGFR has been calculated using the CKD EPI equation. This calculation has not been validated in all clinical situations. eGFR's persistently <60 mL/min signify possible Chronic Kidney Disease.   CBC     Status: Abnormal   Collection Time: 11/27/14  2:53 AM  Result Value Ref Range   WBC 13.6 (H) 4.0 - 10.5 K/uL   RBC 3.99 (L) 4.22 - 5.81 MIL/uL   Hemoglobin 12.2 (L) 13.0 - 17.0 g/dL   HCT 36.8 (L) 39.0 - 52.0 %   MCV 92.2 78.0 - 100.0 fL   MCH 30.6 26.0 - 34.0 pg   MCHC 33.2 30.0 - 36.0 g/dL   RDW 14.5 11.5 - 15.5 %   Platelets 238 150 - 400 K/uL    Imaging: Imaging results have been reviewed  Assessment/Plan:   1. Active Problems: 2.   NSTEMI (non-ST elevated myocardial infarction) 3.   Time Spent Directly with Patient:  20 minutes  Length of Stay:  LOS: 1 day   Pt admitted yesterday with NSTEMI. Peak trop 1.54. Infeorlateral ST changes. Diagnostic cath showed occluded distal Dom RCA without collaterals and with Inf WMA (EF 45%). Minor CAD otherwise. Dr. Martinique elected to Rx medically since the infarct had completed. Will need to be on DAPT, BB, Statin and ACI-I. CRF modif. Smoking cessation. Tx tele. [facility]. Prob home tomorrow. 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 Can prob F/U at our [facility].   Quay Burow 11/27/2014, 9:19 AM

## 2014-11-27 NOTE — Progress Notes (Signed)
Unable to perform echo, he was in the cath lab.

## 2014-11-27 NOTE — Progress Notes (Signed)
Echocardiogram 2D Echocardiogram has been performed.  Rodney Mahoney 11/27/2014, 4:16 PM

## 2014-11-28 ENCOUNTER — Encounter (HOSPITAL_COMMUNITY): Payer: Self-pay | Admitting: Physician Assistant

## 2014-11-28 DIAGNOSIS — I340 Nonrheumatic mitral (valve) insufficiency: Secondary | ICD-10-CM | POA: Diagnosis present

## 2014-11-28 DIAGNOSIS — I10 Essential (primary) hypertension: Secondary | ICD-10-CM

## 2014-11-28 DIAGNOSIS — E785 Hyperlipidemia, unspecified: Secondary | ICD-10-CM | POA: Diagnosis present

## 2014-11-28 LAB — CBC
HEMATOCRIT: 37.7 % — AB (ref 39.0–52.0)
Hemoglobin: 12.5 g/dL — ABNORMAL LOW (ref 13.0–17.0)
MCH: 30.7 pg (ref 26.0–34.0)
MCHC: 33.2 g/dL (ref 30.0–36.0)
MCV: 92.6 fL (ref 78.0–100.0)
Platelets: 214 10*3/uL (ref 150–400)
RBC: 4.07 MIL/uL — ABNORMAL LOW (ref 4.22–5.81)
RDW: 14.3 % (ref 11.5–15.5)
WBC: 15.3 10*3/uL — ABNORMAL HIGH (ref 4.0–10.5)

## 2014-11-28 MED ORDER — METOPROLOL TARTRATE 25 MG PO TABS: 12.5000 mg | ORAL_TABLET | Freq: Two times a day (BID) | ORAL | Status: DC

## 2014-11-28 MED ORDER — ASPIRIN 81 MG PO CHEW: 81.0000 mg | CHEWABLE_TABLET | Freq: Every day | ORAL | Status: DC

## 2014-11-28 MED ORDER — ATORVASTATIN CALCIUM 80 MG PO TABS: 80.0000 mg | ORAL_TABLET | Freq: Every day | ORAL | Status: DC

## 2014-11-28 MED ORDER — CLOPIDOGREL BISULFATE 75 MG PO TABS: 75.0000 mg | ORAL_TABLET | Freq: Every day | ORAL | Status: DC

## 2014-11-28 MED ORDER — LISINOPRIL 2.5 MG PO TABS: 2.5000 mg | ORAL_TABLET | Freq: Every day | ORAL | Status: DC

## 2014-11-28 NOTE — Discharge Summary (Signed)
Discharge Summary   Patient ID: Rodney Mahoney,  MRN: [id number], DOB/AGE: Nov 30, 1960 54 y.o.  Admit date: 11/26/2014 Discharge date: 11/28/2014  Primary Care Provider: DONDIEGO,RICHARD M Primary Cardiologist: new (seen by Dr. Delton See) - will need followup in [place]  Discharge Diagnoses Principal Problem:   NSTEMI (non-ST elevated myocardial infarction) Active Problems:   Essential hypertension   Hyperlipidemia   MR (mitral regurgitation)   Allergies No Known Allergies  Procedures  Echocardiogram 11/27/2014 LV EF: 50% -  55%  ------------------------------------------------------------------- Indications:   Chest pain 786.51. MI - acute 410.91.  ------------------------------------------------------------------- History:  PMH: No prior cardiac history.  ------------------------------------------------------------------- Study Conclusions  - Left ventricle: The cavity size was normal. Systolic function was normal. The estimated ejection fraction was in the range of 50% to 55%. Wall motion was normal; there were no regional wall motion abnormalities. Doppler parameters are consistent with abnormal left ventricular relaxation (grade 1 diastolic dysfunction). - Mitral valve: There was mild regurgitation.    Cardiac catheterization 11/26/2014 Conclusion     Mid Cx to Dist Cx lesion, 40% stenosed.  Dist LAD lesion, 10% stenosed.  Mid RCA to Dist RCA lesion, 100% stenosed.  1st Diag lesion, 90% stenosed.  There is mild to moderate left ventricular systolic dysfunction.  1. 2 vessel obstructive CAD. There is a thrombotic occlusion of the distal RCA with minimal collaterals. There is a severe stenosis in a small side branch of the first diagonal. 2. Mild to moderate LV dysfunction  Plan: recommend medical therapy. The RCA occlusion appears to be a completed event.        Hospital Course  The patient is a 54 year old Caucasian male  with past medical history of hypertension, hyperlipidemia and tobacco abuse. He presented to [facility] on 11/26/2014 with significant unremitting chest pain following a few days of intermittent exertional chest pain. He was rule in as NSTEMI. He had a CTA of the chest at [facility] which was negative for PE. On arrival to [facility], he was chest pain-free. He underwent cardiac catheterization on the same day which showed EF 45%, moderate to severe inferior hypokinesis, 90% small D1 stenosis, 100% mid to distal RCA occlusion with minimal collateral, 40% mid to distal LCx stenosis. It was felt patient likely had a completed infarct, therefore medical therapy was recommended. He was kept in the hospital for 2 days for observation. He was started on aspirin, Lipitor, Plavix, lisinopril and low-dose beta blocker. Echocardiogram was done on the following day on 11/27/2014 which showed EF 50-55%, no regional wall motion abnormality, grade 1 diastolic dysfunction, mild MR.  He was seen in the morning of 11/28/2014, at which time he was chest pain-free. His cath site appears to be stable without significant bleeding or hematoma. He is deemed stable for discharge from cardiology perspective. Repeat emphasis has been placed on compliance with DAPT and tobacco cessation. I will arrange follow-up in our [place] office.   Discharge Vitals Blood pressure 105/57, pulse 65, temperature 98.4 F (36.9 C), temperature source Oral, resp. rate 16, height 5\' 6"  (1.676 m), weight 195 lb 11.2 oz (88.769 kg), SpO2 100 %.  Filed Weights   11/26/14 0646 11/27/14 1416 11/28/14 0524  Weight: 205 lb (92.987 kg) 198 lb 4.8 oz (89.948 kg) 195 lb 11.2 oz (88.769 kg)    Labs  CBC  Recent Labs  11/26/14 0709  11/27/14 0253 11/28/14 0415  WBC 10.2  < > 13.6* 15.3*  NEUTROABS 7.1  --   --   --  HGB 13.7  < > 12.2* 12.5*  HCT 40.8  < > 36.8* 37.7*  MCV 93.4  < > 92.2 92.6  PLT 243  < > 238 214  < > =  values in this interval not displayed. Basic Metabolic Panel  Recent Labs  11/26/14 0709 11/26/14 1932  NA 136  --   K 4.4  --   CL 101  --   CO2 26  --   GLUCOSE 134*  --   BUN 16  --   CREATININE 0.80 0.77  CALCIUM 9.2  --    Liver Function Tests No results for input(s): AST, ALT, ALKPHOS, BILITOT, PROT, ALBUMIN in the last 72 hours. No results for input(s): LIPASE, AMYLASE in the last 72 hours. Cardiac Enzymes  Recent Labs  11/26/14 0709  TROPONINI 1.54*    Disposition  Pt is being discharged home today in good condition.  Follow-up Plans & Appointments      Follow-up Information    Follow up with Jacolyn Reedy, PA-C On 12/05/2014.   Specialty:  Cardiology   Why:  11:40am. 7 day transition of care cardiology followup   Contact information:   [address] [place] [address] [phone]       Discharge Medications    Medication List    STOP taking these medications        amLODipine 5 MG tablet  Commonly known as:  NORVASC     doxycycline 100 MG capsule  Commonly known as:  VIBRAMYCIN      TAKE these medications        acetaminophen 500 MG tablet  Commonly known as:  TYLENOL  Take 1,000 mg by mouth every 6 (six) hours as needed for mild pain.     aspirin 81 MG chewable tablet  Chew 1 tablet (81 mg total) by mouth daily.     atorvastatin 80 MG tablet  Commonly known as:  LIPITOR  Take 1 tablet (80 mg total) by mouth daily at 6 PM.     clopidogrel 75 MG tablet  Commonly known as:  PLAVIX  Take 1 tablet (75 mg total) by mouth daily with breakfast.     diphenhydrAMINE 25 MG tablet  Commonly known as:  BENADRYL  Take 25 mg by mouth every 6 (six) hours as needed.     HYDROcodone-acetaminophen 5-325 MG per tablet  Commonly known as:  NORCO/VICODIN  Take 1-2 tablets by mouth every 4 (four) hours as needed.     lisinopril 2.5 MG tablet  Commonly known as:  PRINIVIL,ZESTRIL  Take 1 tablet (2.5 mg total) by mouth daily.     meloxicam  7.5 MG tablet  Commonly known as:  MOBIC  1 po bid with food     methocarbamol 500 MG tablet  Commonly known as:  ROBAXIN  2 po tid for spasm     metoprolol tartrate 25 MG tablet  Commonly known as:  LOPRESSOR  Take 0.5 tablets (12.5 mg total) by mouth 2 (two) times daily.     NYQUIL PO  Take 30 mLs by mouth at bedtime as needed (cold symptoms).         Duration of Discharge Encounter   Greater than 30 minutes including physician time.  Ramond Dial PA-C Pager: [phone] 11/28/2014, 12:15 PM

## 2014-11-28 NOTE — Discharge Instructions (Signed)
Fat and Cholesterol Control Diet Your diet has an affect on your fat and cholesterol levels in your blood and organs. Too much fat and cholesterol in your blood can affect your:  Heart.  Blood vessels (arteries, veins).  Gallbladder.  Liver.  Pancreas. CONTROL FAT AND CHOLESTEROL WITH DIET Certain foods raise cholesterol and others lower it. It is important to replace bad fats with other types of fat.  Do not eat:  Fatty meats, such as hot dogs and salami.  Stick margarine and some tub margarines that have "partially hydrogenated oils" in them.  Baked goods, such as cookies and crackers that have "partially hydrogenated oils" in them.  Saturated tropical oils, such as coconut and palm oil. Eat the following foods:  Round or loin cuts of red meat.  Chicken (without skin).  Fish.  Veal.  Ground [place] breast.  Shellfish.  Fruit, such as apples.  Vegetables, such as broccoli, potatoes, and carrots.  Beans, peas, and lentils (legumes).  Grains, such as barley, rice, couscous, and bulgar wheat.  Pasta (without cream sauces). Look for foods that are nonfat, low in fat, and low in cholesterol.  FIND FOODS THAT ARE LOWER IN FAT AND CHOLESTEROL  Find foods with soluble fiber and plant sterols (phytosterol). You should eat 2 grams a day of these foods. These foods include:  Fruits.  Vegetables.  Whole grains.  Dried beans and peas.  Nuts and seeds.  Read package labels. Look for low-saturated fats, trans fat free, low-fat foods.  Choose cheese that have only 2 to 3 grams of saturated fat per ounce.  Use heart-healthy tub margarine that is free of trans fat or partially hydrogenated oil.  Avoid buying baked goods that have partially hydrogenated oils in them. Instead, buy baked goods made with whole grains (whole-wheat or whole oat flour). Avoid baked goods labeled with "flour" or "enriched flour."  Buy non-creamy canned soups with reduced salt and no added  fats. PREPARING YOUR FOOD  Broil, bake, steam, or roast foods. Do not fry food.  Use non-stick cooking sprays.  Use lemon or herbs to flavor food instead of using butter or stick margarine.  Use nonfat yogurt, salsa, or low-fat dressings for salads. LOW-SATURATED FAT / LOW-FAT FOOD SUBSTITUTES  Meats / Saturated Fat (g)  Avoid: Steak, marbled (3 oz/85 g) / 11 g.  Choose: Steak, lean (3 oz/85 g) / 4 g.  Avoid: Hamburger (3 oz/85 g) / 7 g.  Choose: Hamburger, lean (3 oz/85 g) / 5 g.  Avoid: Ham (3 oz/85 g) / 6 g.  Choose: Ham, lean cut (3 oz/85 g) / 2.4 g.  Avoid: Chicken, with skin, dark meat (3 oz/85 g) / 4 g.  Choose: Chicken, skin removed, dark meat (3 oz/85 g) / 2 g.  Avoid: Chicken, with skin, light meat (3 oz/85 g) / 2.5 g.  Choose: Chicken, skin removed, light meat (3 oz/85 g) / 1 g. Dairy / Saturated Fat (g)  Avoid: Whole milk (1 cup) / 5 g.  Choose: Low-fat milk, 2% (1 cup) / 3 g.  Choose: Low-fat milk, 1% (1 cup) / 1.5 g.  Choose: Skim milk (1 cup) / 0.3 g.  Avoid: Hard cheese (1 oz/28 g) / 6 g.  Choose: Skim milk cheese (1 oz/28 g) / 2 to 3 g.  Avoid: Cottage cheese, 4% fat (1 cup) / 6.5 g.  Choose: Low-fat cottage cheese, 1% fat (1 cup) / 1.5 g.  Avoid: Ice cream (1 cup) / 9 g.  Choose: Sherbet (1 cup) / 2.5 g.  Choose: Nonfat frozen yogurt (1 cup) / 0.3 g.  Choose: Frozen fruit bar / trace.  Avoid: Whipped cream (1 tbs) / 3.5 g.  Choose: Nondairy whipped topping (1 tbs) / 1 g. Condiments / Saturated Fat (g)  Avoid: Mayonnaise (1 tbs) / 2 g.  Choose: Low-fat mayonnaise (1 tbs) / 1 g.  Avoid: Butter (1 tbs) / 7 g.  Choose: Extra light margarine (1 tbs) / 1 g.  Avoid: Coconut oil (1 tbs) / 11.8 g.  Choose: Olive oil (1 tbs) / 1.8 g.  Choose: Corn oil (1 tbs) / 1.7 g.  Choose: Safflower oil (1 tbs) / 1.2 g.  Choose: Sunflower oil (1 tbs) / 1.4 g.  Choose: Soybean oil (1 tbs) / 2.4 g .  Choose: Canola oil (1 tbs) / 1  g. Document Released: 12/01/2011 Document Revised: 02/01/2013 Document Reviewed: 08/31/2013 [facility] Patient Information 2015 [place], [place]. This information is not intended to replace advice given to you by your health care provider. Make sure you discuss any questions you have with your health care provider.   Acute Coronary Syndrome Acute coronary syndrome (ACS) is an urgent problem in which the blood and oxygen supply to the heart is critically deficient. ACS requires hospitalization because one or more coronary arteries may be blocked. ACS represents a range of conditions including:  Previous angina that is now unstable, lasts longer, happens at rest, or is more intense.  A heart attack, with heart muscle cell injury and death. There are three vital coronary arteries that supply the heart muscle with blood and oxygen so that it can pump blood effectively. If blockages to these arteries develop, blood flow to the heart muscle is reduced. If the heart does not get enough blood, angina may occur as the first warning sign. SYMPTOMS   The most common signs of angina include:  Tightness or squeezing in the chest.  Feeling of heaviness on the chest.  Discomfort in the arms, neck, back, or jaw.  Shortness of breath and nausea.  Cold, wet skin.  Angina is usually brought on by physical effort or excitement which increase the oxygen needs of the heart. These states increase the blood flow needs of the heart beyond what can be delivered.  Other symptoms that are not as common include:  Fatigue  Unexplained feelings of nervousness or anxiety  Weakness  Diarrhea  Sometimes, you may not have noticed any symptoms at all but still suffered a cardiac injury. TREATMENT   Medicines to help discomfort may include nitroglycerin (nitro) in the form of tablets or a spray for rapid relief, or longer-acting forms such as cream, patches, or capsules. (Be aware that there are many side effects  and possible interactions with other drugs).  Other medicines may be used to help the heart pump better.  Procedures to open blocked arteries including angioplasty or stent placement to keep the arteries open.  Open heart surgery may be needed when there are many blockages or they are in critical locations that are best treated with surgery. HOME CARE INSTRUCTIONS   Do not use any tobacco products including cigarettes, chewing tobacco, or electronic cigarettes.  Take one baby or adult aspirin daily, if your health care provider advises. This helps reduce the risk of a heart attack.  It is very important that you follow the angina treatment prescribed by your health care provider. Make arrangements for proper follow-up care.  Eat a heart healthy diet  with salt and fat restrictions as advised.  Regular exercise is good for you as long as it does not cause discomfort. Do not begin any new type of exercise until you check with your health care provider.  If you are overweight, you should lose weight.  Try to maintain normal blood lipid levels.  Keep your blood pressure under control as recommended by your health care provider.  You should tell your health care provider right away about any increase in the severity or frequency of your chest discomfort or angina attacks. When you have angina, you should stop what you are doing and sit down. This may bring relief in 3 to 5 minutes. If your health care provider has prescribed nitro, take it as directed.  If your health care provider has given you a follow-up appointment, it is very important to keep that appointment. Not keeping the appointment could result in a chronic or permanent injury, pain, and disability. If there is any problem keeping the appointment, you must call back to this facility for assistance. SEEK IMMEDIATE MEDICAL CARE IF:   You develop nausea, vomiting, or shortness of breath.  You feel faint, lightheaded, or pass  out.  Your chest discomfort gets worse.  You are sweating or experience sudden profound fatigue.  You do not get relief of your chest pain after 3 doses of nitro.  Your discomfort lasts longer than 15 minutes. MAKE SURE YOU:   Understand these instructions.  Will watch your condition.  Will get help right away if you are not doing well or get worse.  Take all medicines as directed by your health care provider. Document Released: 06/01/2005 Document Revised: 06/06/2013 Document Reviewed: 10/03/2013 [facility] Patient Information 2015 [place], [place]. This information is not intended to replace advice given to you by your health care provider. Make sure you discuss any questions you have with your health care provider.  No driving for 24 hours. No lifting over 5 lbs for 1 week. No sexual activity for 1 week. You may return to work on 11/29/2014. Keep procedure site clean & dry. If you notice increased pain, swelling, bleeding or pus, call/return!  You may shower, but no soaking baths/hot tubs/pools for 1 week.

## 2014-11-28 NOTE — Plan of Care (Signed)
Problem: Food- and Nutrition-Related Knowledge Deficit (NB-1.1) Goal: Nutrition education Formal process to instruct or train a patient/client in a skill or to impart knowledge to help patients/clients voluntarily manage or modify food choices and eating behavior to maintain or improve health. Nutrition Education Note  RD consulted for nutrition education regarding a Heart Healthy diet.   RD provided "Heart Healthy Nutrition Therapy" handout from the Academy of [employer]etics. Reviewed patient's dietary recall. Provided examples on ways to decrease sodium and fat intake in diet. Discouraged intake of processed foods and use of salt shaker. Encouraged fresh fruits and vegetables as well as whole grain sources of carbohydrates to maximize fiber intake. Teach back method used.  Expect fair compliance.  Body mass index is 31.6 kg/(m^2). Pt meets criteria for class 1 obesity based on current BMI.  Patient is being discharged home today. Labs and medications reviewed. No further nutrition interventions warranted at this time. RD contact information provided. If additional nutrition issues arise, please re-consult RD.  Joaquin Courts, RD, LDN, CNSC Pager [phone] After Hours Pager [phone]

## 2014-11-28 NOTE — Progress Notes (Signed)
Pt discharged home.  Reviewed discharge instructions and education, all questions answered.  Pt stated he did not want to wait for his brother to come and was going to find his own way home, pt walked off the unit.  Assessment unchanged from earlier.

## 2014-11-28 NOTE — Progress Notes (Signed)
Patient Name: Rodney Mahoney Date of Encounter: 11/28/2014  Primary Cardiologist: new [name]   Principal Problem:   NSTEMI (non-ST elevated myocardial infarction) Active Problems:   Essential hypertension    SUBJECTIVE  Denies any CP or SOB.   CURRENT MEDS . aspirin  81 mg Oral Daily  . atorvastatin  80 mg Oral q1800  . clopidogrel  75 mg Oral Q breakfast  . heparin  5,000 Units Subcutaneous 3 times per day  . lisinopril  2.5 mg Oral Daily  . metoprolol tartrate  12.5 mg Oral BID  . sodium chloride  3 mL Intravenous Q12H    OBJECTIVE  Filed Vitals:   11/27/14 1351 11/27/14 1416 11/27/14 2009 11/28/14 0524  BP:  117/51 118/57 105/57  Pulse: 69 66 73 65  Temp: 99.1 F (37.3 C) 99.2 F (37.3 C) 99.4 F (37.4 C) 98.4 F (36.9 C)  TempSrc: Oral Oral Oral Oral  Resp:   16 16  Height:  5\' 6"  (1.676 m)    Weight:  198 lb 4.8 oz (89.948 kg)  195 lb 11.2 oz (88.769 kg)  SpO2:  95% 98% 100%    Intake/Output Summary (Last 24 hours) at 11/28/14 0736 Last data filed at 11/28/14 0037  Gross per 24 hour  Intake   1076 ml  Output    825 ml  Net    251 ml   Filed Weights   11/26/14 0646 11/27/14 1416 11/28/14 0524  Weight: 205 lb (92.987 kg) 198 lb 4.8 oz (89.948 kg) 195 lb 11.2 oz (88.769 kg)    PHYSICAL EXAM  General: Pleasant, NAD. Neuro: Alert and oriented X 3. Moves all extremities spontaneously. Psych: Normal affect. HEENT:  Normal  Neck: Supple without bruits or JVD. Lungs:  Resp regular and unlabored, CTA. Heart: RRR no s3, s4, or murmurs. Abdomen: Soft, non-tender, non-distended, BS + x 4.  Extremities: No clubbing, cyanosis or edema. DP/PT/Radials 2+ and equal bilaterally.  Accessory Clinical Findings  CBC  Recent Labs  11/26/14 0709 11/26/14 1932 11/27/14 0253  WBC 10.2 14.1* 13.6*  NEUTROABS 7.1  --   --   HGB 13.7 13.3 12.2*  HCT 40.8 40.2 36.8*  MCV 93.4 93.1 92.2  PLT 243 233 238   Basic Metabolic Panel  Recent Labs  11/26/14 0709 11/26/14 1932  NA 136  --   K 4.4  --   CL 101  --   CO2 26  --   GLUCOSE 134*  --   BUN 16  --   CREATININE 0.80 0.77  CALCIUM 9.2  --    Cardiac Enzymes  Recent Labs  11/26/14 0709  TROPONINI 1.54*    TELE NSR with HR 60-70s, 2 episode of NSVT (3 beats each)    ECG  No new EKG  Echocardiogram 10/27/2014  LV EF: 50% -  55%  ------------------------------------------------------------------- Indications:   Chest pain 786.51. MI - acute 410.91.  ------------------------------------------------------------------- History:  PMH: No prior cardiac history.  ------------------------------------------------------------------- Study Conclusions  - Left ventricle: The cavity size was normal. Systolic function was normal. The estimated ejection fraction was in the range of 50% to 55%. Wall motion was normal; there were no regional wall motion abnormalities. Doppler parameters are consistent with abnormal left ventricular relaxation (grade 1 diastolic dysfunction). - Mitral valve: There was mild regurgitation.    Radiology/Studies  Dg Chest 2 View  11/26/2014   CLINICAL DATA:  Chest pain.  EXAM: CHEST  2 VIEW  COMPARISON:  11/02/2009  and 11/26/2003  FINDINGS: The heart size and mediastinal contours are within normal limits.  There is a small vague area of increased density at the right lung base which may represent a confluence of normal structures. Lungs are otherwise clear. No acute osseous abnormality.  IMPRESSION: Small vague density at the right lung base on the PA view. I recommend a repeat PA view, slightly oblique for further evaluation.   Electronically Signed   By: Francene Boyers M.D.   On: 11/26/2014 08:02   Ct Angio Chest Pe W/cm &/or Wo Cm  11/26/2014   CLINICAL DATA:  Mid chest pain for 3 days.  Question PE.  EXAM: CT ANGIOGRAPHY CHEST WITH CONTRAST  TECHNIQUE: Multidetector CT imaging of the chest was performed using the  standard protocol during bolus administration of intravenous contrast. Multiplanar CT image reconstructions and MIPs were obtained to evaluate the vascular anatomy.  CONTRAST:  OMNIPAQUE IOHEXOL 350 MG/ML SOLN  COMPARISON:  None.  FINDINGS: Mediastinum/Nodes: Negative for pulmonary embolus. Mediastinal lymph nodes are not enlarged by CT size criteria. No hilar or axillary adenopathy atherosclerotic calcification of the arterial vasculature. Pulmonary arteries are slightly enlarged. Heart is normal in size. No pericardial effusion.  Lungs/Pleura: Mild upper lung zone predominant centrilobular nodularity, indicative of respiratory bronchiolitis in this patient who is a current everyday smoker. Trace emphysematous change at the apices. No pleural fluid. Airway is unremarkable.  Upper abdomen: Visualized portion of the liver appears slightly decreased in attenuation diffusely. Visualized portions of the adrenal glands, kidneys, spleen, pancreas and stomach are grossly unremarkable. No upper abdominal adenopathy.  Musculoskeletal: No worrisome lytic or sclerotic lesions. Degenerative changes are seen in the spine.  Review of the MIP images confirms the above findings.  IMPRESSION: 1. Negative for pulmonary embolus. 2. Pulmonary arteries are slightly enlarged, indicative of pulmonary arterial hypertension.   Electronically Signed   By: Leanna Battles M.D.   On: 11/26/2014 09:16    ASSESSMENT AND PLAN  54 yo male with no prior CAD history transferred from [facility] ER to [facility] for LHC after present with CP with exertion.  1. NSTEMI  - CTA of chest 6/13 negative for PE  - cath 11/26/2014 EF 45%, moderate to severe inferior hypokinesis, 90% small D1, 100% mid to distal RCA with minimal collateral, 40% mid to distal LCx. Medical therapy as RCA occlusion appears to be completed event  - Echo 11/27/2014 EF 50-55%, no RWMA, grade 1 diastolic dysfunction, mild MR  - continue ASA, lipitor, plavix, lisinopril. BB was  not added on admission due to bradycardia, metoprolol added post cath, HR stable 60-70s  - stable for discharge today  2. HTN  3. HLD  4. Bradycardia  5. Smoking cessation   Signed, Amedeo Plenty Pager: [phone]  Agree with note by Azalee Course PA-C  S/P NSTEMI with radial cath showing completed inf MI, occluded RCA. Med Rx recommended. OK to D/C home on DAPT and other post MI approp meds. F/U in the [facility]   Runell Gess, M.D., FACP, [facility], [facility] [address] [place], [place]  [address]  [phone] 11/28/2014 10:01 AM

## 2014-11-29 ENCOUNTER — Encounter: Payer: Self-pay | Admitting: Physician Assistant

## 2014-11-29 ENCOUNTER — Telehealth: Payer: Self-pay | Admitting: Physician Assistant

## 2014-11-29 MED ORDER — NITROGLYCERIN 0.4 MG SL SUBL: 0.4000 mg | SUBLINGUAL_TABLET | SUBLINGUAL | Status: DC | PRN

## 2014-11-29 MED ORDER — ISOSORBIDE MONONITRATE ER 30 MG PO TB24: 30.0000 mg | ORAL_TABLET | Freq: Every day | ORAL | Status: DC

## 2014-11-29 NOTE — Telephone Encounter (Signed)
Got a call from nurse on 3W that patient showed up to the department asking for work note. He says he was told he could probably return in 2-3 weeks. He also incidentally reported that he was continuing to have the same chest pain with heavy exertion that he was having before. Discomfort always goes away quickly with rest. No change from prior angina, and he says it is no different from yesterday. He was d/c on low dose metoprolol. Meds likely limited due to BP running low 100s-1teens and HR 60s. No rest pain or any other associated symptoms. D/w Dr. Allyson Sabal. Will start Imdur 30mg  daily and rx SL NTG PRN. If his symptoms do not improve he may have to be referred for consideration of attempted PCI to his RCA. Will have him keep f/u next week as planned in [place] with Jacolyn Reedy PA-C. I have written a work note for him not to return to work until cleared at cardiology follow-up. ER precautions reviewed. Patient verbalized understanding and gratitude. Jahsiah Carpenter PA-C

## 2014-11-29 NOTE — Progress Notes (Signed)
Note opened in error. See phone note.

## 2014-12-05 ENCOUNTER — Telehealth: Payer: Self-pay | Admitting: *Deleted

## 2014-12-05 ENCOUNTER — Encounter: Payer: Self-pay | Admitting: *Deleted

## 2014-12-05 ENCOUNTER — Other Ambulatory Visit (HOSPITAL_COMMUNITY)
Admission: RE | Admit: 2014-12-05 | Discharge: 2014-12-05 | Disposition: A | Discharge status: Home / Self Care | Payer: BLUE CROSS/BLUE SHIELD | Source: Ambulatory Visit | Attending: Physician Assistant | Admitting: Physician Assistant

## 2014-12-05 ENCOUNTER — Ambulatory Visit (INDEPENDENT_AMBULATORY_CARE_PROVIDER_SITE_OTHER): Payer: BLUE CROSS/BLUE SHIELD | Admitting: Physician Assistant

## 2014-12-05 DIAGNOSIS — I2510 Atherosclerotic heart disease of native coronary artery without angina pectoris: Secondary | ICD-10-CM | POA: Insufficient documentation

## 2014-12-05 DIAGNOSIS — K921 Melena: Secondary | ICD-10-CM

## 2014-12-05 DIAGNOSIS — Z136 Encounter for screening for cardiovascular disorders: Secondary | ICD-10-CM | POA: Diagnosis not present

## 2014-12-05 LAB — CBC WITH DIFFERENTIAL/PLATELET
BASOS ABS: 0 10*3/uL (ref 0.0–0.1)
Basophils Relative: 0 % (ref 0–1)
EOS ABS: 0.1 10*3/uL (ref 0.0–0.7)
EOS PCT: 1 % (ref 0–5)
HEMATOCRIT: 41.4 % (ref 39.0–52.0)
HEMOGLOBIN: 14 g/dL (ref 13.0–17.0)
LYMPHS PCT: 25 % (ref 12–46)
Lymphs Abs: 3.5 10*3/uL (ref 0.7–4.0)
MCH: 31.6 pg (ref 26.0–34.0)
MCHC: 33.8 g/dL (ref 30.0–36.0)
MCV: 93.5 fL (ref 78.0–100.0)
Monocytes Absolute: 1.4 10*3/uL — ABNORMAL HIGH (ref 0.1–1.0)
Monocytes Relative: 10 % (ref 3–12)
Neutro Abs: 9.2 10*3/uL — ABNORMAL HIGH (ref 1.7–7.7)
Neutrophils Relative %: 64 % (ref 43–77)
PLATELETS: 394 10*3/uL (ref 150–400)
RBC: 4.43 MIL/uL (ref 4.22–5.81)
RDW: 13.5 % (ref 11.5–15.5)
WBC: 14.3 10*3/uL — AB (ref 4.0–10.5)

## 2014-12-05 LAB — BASIC METABOLIC PANEL
Anion gap: 11 (ref 5–15)
BUN: 13 mg/dL (ref 6–20)
CALCIUM: 9.5 mg/dL (ref 8.9–10.3)
CO2: 27 mmol/L (ref 22–32)
CREATININE: 0.78 mg/dL (ref 0.61–1.24)
Chloride: 100 mmol/L — ABNORMAL LOW (ref 101–111)
GFR calc Af Amer: >60 mL/min (ref >60)
GFR calc non Af Amer: >60 mL/min (ref >60)
Glucose, Bld: 111 mg/dL — ABNORMAL HIGH (ref 65–99)
Potassium: 4.3 mmol/L (ref 3.5–5.1)
Sodium: 138 mmol/L (ref 135–145)

## 2014-12-05 MED ORDER — PANTOPRAZOLE SODIUM 40 MG PO TBEC: 40.0000 mg | DELAYED_RELEASE_TABLET | Freq: Every day | ORAL | Status: DC

## 2014-12-05 NOTE — Assessment & Plan Note (Signed)
Patient stopped his Lipitor because of his GI symptoms. I've asked him to resume Lipitor. Check fasting lipid panel and LFTs in 6 weeks.

## 2014-12-05 NOTE — Telephone Encounter (Signed)
Called to give results. Unable to leave message

## 2014-12-05 NOTE — Progress Notes (Signed)
Cardiology Office Note   Date:  12/05/2014   ID:  Rodney Mahoney, DOB 1960-09-30, MRN [id number]  PCP:  Isabella Stalling, MD  Cardiologist:  New to [name]  Chief Complaint:nausea vomiting black stools    History of Present Illness: Rodney Mahoney is a 54 y.o. male who presents for post hospital follow-up. He was admitted to the hospital with the non-STEMI in 11/26/14. CTA of the chest was negative for PE. He underwent cardiac catheterization which showed an EF of 45% with moderate to severe inferior hypokinesis, 90% small diagonal 1, total mid to distal RCA occlusion with minimal collaterals, 40% mid to distal left circumflex. It was felt the patient likely had a completed infarct and medical therapy was recommended. Follow-up 2-D echo 11/27/14 showed EF to be 50-55% with no regional wall motion abnormality, grade 1 diastolic dysfunction and mild MR. He then showed up to the hospital floor several days later complaining of vague chest pain. Dr. Allyson Sabal put him on Imdur 30 mg daily.  The patient presents today complaining of 3 days of nausea or vomiting, diarrhea, abdominal pain and black stools. He stopped taking the Imdur were and metoprolol. He says he's taken the Plavix with breakfast but vomited it up this morning. He's had stomach problems in the past and it feels similar. He denies any chest pain, tightness, dyspnea, dizziness or presyncope.    Past Medical History  Diagnosis Date  . Hypertension   . Bronchitis   . CAD (coronary artery disease)     a. cath 11/26/2014 EF 45%, moderate to severe inferior hypokinesis, 90% small D1, 100% mid to distal RCA with minimal collateral, 40% mid to distal LCx. Medical therapy as RCA occlusion appears to be completed event b. echo 11/27/2014 EF 50-55%  . Hyperlipidemia   . MR (mitral regurgitation)     mild by echo 11/27/2014    Past Surgical History  Procedure Laterality Date  . Neck surgery    . Cardiac catheterization N/A  11/26/2014    Procedure: Left Heart Cath and Coronary Angiography;  Surgeon: Peter M Swaziland, MD;  Location: [facility] INVASIVE CV LAB;  Service: Cardiovascular;  Laterality: N/A;     Current Outpatient Prescriptions  Medication Sig Dispense Refill  . aspirin 81 MG chewable tablet Chew 1 tablet (81 mg total) by mouth daily.    Marland Kitchen atorvastatin (LIPITOR) 80 MG tablet Take 1 tablet (80 mg total) by mouth daily at 6 PM. 30 tablet 11  . clopidogrel (PLAVIX) 75 MG tablet Take 1 tablet (75 mg total) by mouth daily with breakfast. 30 tablet 11  . isosorbide mononitrate (IMDUR) 30 MG 24 hr tablet Take 1 tablet (30 mg total) by mouth daily. 30 tablet 3  . lisinopril (PRINIVIL,ZESTRIL) 2.5 MG tablet Take 1 tablet (2.5 mg total) by mouth daily. 30 tablet 11  . metoprolol tartrate (LOPRESSOR) 25 MG tablet Take 0.5 tablets (12.5 mg total) by mouth 2 (two) times daily. 30 tablet 11  . nitroGLYCERIN (NITROSTAT) 0.4 MG SL tablet Place 1 tablet (0.4 mg total) under the tongue every 5 (five) minutes as needed for chest pain (up to 3 doses). 25 tablet 3   No current facility-administered medications for this visit.    Allergies:   Review of patient's allergies indicates no known allergies.    Social History:  The patient  reports that he has been smoking Cigarettes.  He does not have any smokeless tobacco history on file. He reports that he drinks alcohol. He reports  that he does not use illicit drugs.   Family History:  The patient's    family history is not on file.    ROS:  Please see the history of present illness.   Otherwise, review of systems are positive for none.   All other systems are reviewed and negative.    PHYSICAL EXAM: VS:  BP 128/80 mmHg  Pulse 83  Ht  (1.676 m)  Wt 191 lb (86.637 kg)  BMI 30.84 kg/m2  SpO2 98% , BMI Body mass index is 30.84 kg/(m^2). GEN: Well nourished, well developed, in no acute distress Neck: no JVD, HJR, carotid bruits, or masses Cardiac: RRR; no murmurs,gallop,  rubs, thrill or heave,  Respiratory:  clear to auscultation bilaterally, normal work of breathing GI: soft, nontender, nondistended, + BS MS: no deformity or atrophy Extremities: Right arm at cath site without hematoma or hemorrhage, lower extremities without cyanosis, clubbing, edema, good distal pulses bilaterally.  Skin: warm and dry, no rash Neuro:  Strength and sensation are intact    EKG:  EKG is ordered today. The ekg ordered today demonstrates normal sinus rhythm with LVH, inferior T-wave inversion, no acute change Recent Labs: 11/26/2014: BUN 16; Creatinine, Ser 0.77; Potassium 4.4; Sodium 136 11/28/2014: Hemoglobin 12.5*; Platelets 214    Lipid Panel No results found for: CHOL, TRIG, HDL, CHOLHDL, VLDL, LDLCALC, LDLDIRECT    Wt Readings from Last 3 Encounters:  12/05/14 191 lb (86.637 kg)  11/28/14 195 lb 11.2 oz (88.769 kg)  10/09/14 204 lb (92.534 kg)      Other studies Reviewed: Additional studies/ records that were reviewed today include and review of the records demonstrates:   Cardiac catheterization 11/26/2014 Conclusion       Mid Cx to Dist Cx lesion, 40% stenosed.  Dist LAD lesion, 10% stenosed.  Mid RCA to Dist RCA lesion, 100% stenosed.  1st Diag lesion, 90% stenosed.  There is mild to moderate left ventricular systolic dysfunction.   1. 2 vessel obstructive CAD. There is a thrombotic occlusion of the distal RCA with minimal collaterals. There is a severe stenosis in a small side branch of the first diagonal. 2. Mild to moderate LV dysfunction  Plan: recommend medical therapy. The RCA occlusion appears to be a completed event.         Echocardiogram 11/27/2014 LV EF: 50% -   55%  ------------------------------------------------------------------- Indications:      Chest pain 786.51.  MI - acute 410.91.  ------------------------------------------------------------------- History:   PMH:  No prior cardiac  history.  ------------------------------------------------------------------- Study Conclusions  - Left ventricle: The cavity size was normal. Systolic function was   normal. The estimated ejection fraction was in the range of 50%   to 55%. Wall motion was normal; there were no regional wall   motion abnormalities. Doppler parameters are consistent with   abnormal left ventricular relaxation (grade 1 diastolic   dysfunction). - Mitral valve: There was mild regurgitation.      ASSESSMENT AND PLAN:  CAD (coronary artery disease), native coronary artery Patient had recent non-STEMI with cath revealing total RCA, 90% diagonal 40% circumflex 10% LAD with mild to moderate LV dysfunction. The RCA occlusion appeared to be a completed event so PCI was not performed at medical therapy recommended. The patient is now having severe nausea vomiting diarrhea and black stools for the past 3 days on Plavix and aspirin. He also stopped his metoprolol and Imdur because he was feeling so terrible. I discussed this patient in detail with  Dr.Koneswaran. Because he did not have a stent we will stop his Plavix and start Protonix 40 mg once daily. We will check a CBC today and have referred him for urgent GI workup. He will be seen here next week in follow-up. He denies any chest pain so I stopped his Imdur. I've asked him to resume his metoprolol and Lipitor..  Hyperlipidemia Patient stopped his Lipitor because of his GI symptoms. I've asked him to resume Lipitor. Check fasting lipid panel and LFTs in 6 weeks.  Essential hypertension Blood pressure stable    Signed, Jacolyn Reedy, PA-C  12/05/2014 11:58 AM    [facility] [address] [place], [place], [place]  [address] Phone: [phone]; Fax: [phone]

## 2014-12-05 NOTE — Telephone Encounter (Signed)
-----   Message from Dyann Kief, PA-C sent at 12/05/2014  3:02 PM EDT ----- Blood work all ok including normal hemoglobin. Continue with plan to f/u with GI

## 2014-12-05 NOTE — Assessment & Plan Note (Signed)
Blood pressure stable ? ?

## 2014-12-05 NOTE — Patient Instructions (Addendum)
Your physician recommends that you schedule a follow-up appointment in:   Your physician recommends that you return for lab work in:  Today  Your physician has recommended you make the following change in your medication:  STOP PLAVIX, IMDUR START PROTONIX 40 MG DAILY RESUME TAKING METOPROLOL  Your physician recommends that you continue on your current medications as directed. Please refer to the Current Medication list given to you today.  You have been referred to Gastroenterology  Thank you for choosing [facility]!

## 2014-12-05 NOTE — Assessment & Plan Note (Addendum)
Patient had recent non-STEMI with cath revealing total RCA, 90% diagonal 40% circumflex 10% LAD with mild to moderate LV dysfunction. The RCA occlusion appeared to be a completed event so PCI was not performed at medical therapy recommended. The patient is now having severe nausea vomiting diarrhea and black stools for the past 3 days on Plavix and aspirin. He also stopped his metoprolol and Imdur because he was feeling so terrible. I discussed this patient in detail with Dr.Koneswaran. Because he did not have a stent we will stop his Plavix and start Protonix 40 mg once daily. We will check a CBC today and have referred him for urgent GI workup. He will be seen here next week in follow-up. He denies any chest pain so I stopped his Imdur. I've asked him to resume his metoprolol and Lipitor.Marland Kitchen

## 2014-12-06 ENCOUNTER — Telehealth: Payer: Self-pay | Admitting: Gastroenterology

## 2014-12-06 ENCOUNTER — Encounter: Payer: BLUE CROSS/BLUE SHIELD | Admitting: Gastroenterology

## 2014-12-06 ENCOUNTER — Encounter: Payer: Self-pay | Admitting: Gastroenterology

## 2014-12-06 NOTE — Progress Notes (Signed)
UNABLE TO CONFIRM APPT WITH PT. HE IS A NO SHOW TODAY. LETTER WILL BE SENT TO CONTACT [facility] FOR AN APPT.Marland Kitchen

## 2014-12-06 NOTE — Telephone Encounter (Signed)
Received referral in [facility]Q yesterday for an (emergency) referral and I have tried calling all numbers provided and called patient's PCP and got contact number. All numbers are not working, have been changed or VM is full where I can't leave a message. I have LM with [facility] for someone to call me back and see if they can help me get in touch with patient. He is on the schedule for today at 3pm to see SF.

## 2014-12-06 NOTE — Telephone Encounter (Signed)
REVIEWED-NO ADDITIONAL RECOMMENDATIONS. 

## 2014-12-06 NOTE — Telephone Encounter (Signed)
Reviewed

## 2014-12-06 NOTE — Progress Notes (Signed)
   Subjective:    Patient ID: Rodney Mahoney, male    DOB: September 10, 1960, 54 y.o.   MRN: [id number]  HPI   Past Medical History  Diagnosis Date  . Hypertension   . Bronchitis   . CAD (coronary artery disease)     a. cath 11/26/2014 EF 45%, moderate to severe inferior hypokinesis, 90% small D1, 100% mid to distal RCA with minimal collateral, 40% mid to distal LCx. Medical therapy as RCA occlusion appears to be completed event b. echo 11/27/2014 EF 50-55%  . Hyperlipidemia   . MR (mitral regurgitation)     mild by echo 11/27/2014    Past Surgical History  Procedure Laterality Date  . Neck surgery    . Cardiac catheterization N/A 11/26/2014    Procedure: Left Heart Cath and Coronary Angiography;  Surgeon: Peter M Swaziland, MD;  Location: [facility] INVASIVE CV LAB;  Service: Cardiovascular;  Laterality: N/A;   No Known Allergies    No family history on file.  History   Social History  . Marital Status: Married    Spouse Name: N/A  . Number of Children: N/A  . Years of Education: N/A   Social History Main Topics  . Smoking status: Current Every Day Smoker    Types: Cigarettes  . Smokeless tobacco: Not on file  . Alcohol Use: Yes     Comment: occ  . Drug Use: No  . Sexual Activity: Yes    Birth Control/ Protection: None    Review of Systems      Objective:   Physical Exam        Assessment & Plan:

## 2014-12-07 ENCOUNTER — Inpatient Hospital Stay (HOSPITAL_COMMUNITY)
Admission: EM | Admit: 2014-12-07 | Discharge: 2014-12-09 | DRG: 391 | Disposition: A | Discharge status: Home / Self Care | Payer: BLUE CROSS/BLUE SHIELD | Attending: Family Medicine | Admitting: Family Medicine

## 2014-12-07 ENCOUNTER — Encounter (HOSPITAL_COMMUNITY): Payer: Self-pay | Admitting: Emergency Medicine

## 2014-12-07 ENCOUNTER — Emergency Department (HOSPITAL_COMMUNITY): Payer: BLUE CROSS/BLUE SHIELD

## 2014-12-07 DIAGNOSIS — Z8249 Family history of ischemic heart disease and other diseases of the circulatory system: Secondary | ICD-10-CM | POA: Diagnosis not present

## 2014-12-07 DIAGNOSIS — I214 Non-ST elevation (NSTEMI) myocardial infarction: Secondary | ICD-10-CM | POA: Diagnosis present

## 2014-12-07 DIAGNOSIS — F17210 Nicotine dependence, cigarettes, uncomplicated: Secondary | ICD-10-CM | POA: Diagnosis present

## 2014-12-07 DIAGNOSIS — I2510 Atherosclerotic heart disease of native coronary artery without angina pectoris: Secondary | ICD-10-CM | POA: Diagnosis present

## 2014-12-07 DIAGNOSIS — Z7982 Long term (current) use of aspirin: Secondary | ICD-10-CM | POA: Diagnosis not present

## 2014-12-07 DIAGNOSIS — R112 Nausea with vomiting, unspecified: Secondary | ICD-10-CM | POA: Diagnosis not present

## 2014-12-07 DIAGNOSIS — E785 Hyperlipidemia, unspecified: Secondary | ICD-10-CM | POA: Diagnosis present

## 2014-12-07 DIAGNOSIS — D649 Anemia, unspecified: Secondary | ICD-10-CM | POA: Diagnosis present

## 2014-12-07 DIAGNOSIS — K5720 Diverticulitis of large intestine with perforation and abscess without bleeding: Principal | ICD-10-CM | POA: Diagnosis present

## 2014-12-07 DIAGNOSIS — I10 Essential (primary) hypertension: Secondary | ICD-10-CM | POA: Diagnosis present

## 2014-12-07 DIAGNOSIS — K5721 Diverticulitis of large intestine with perforation and abscess with bleeding: Secondary | ICD-10-CM | POA: Diagnosis not present

## 2014-12-07 DIAGNOSIS — R109 Unspecified abdominal pain: Secondary | ICD-10-CM

## 2014-12-07 DIAGNOSIS — Z981 Arthrodesis status: Secondary | ICD-10-CM | POA: Diagnosis not present

## 2014-12-07 DIAGNOSIS — Z7902 Long term (current) use of antithrombotics/antiplatelets: Secondary | ICD-10-CM | POA: Diagnosis not present

## 2014-12-07 DIAGNOSIS — K5792 Diverticulitis of intestine, part unspecified, without perforation or abscess without bleeding: Secondary | ICD-10-CM | POA: Diagnosis present

## 2014-12-07 HISTORY — DX: Non-ST elevation (NSTEMI) myocardial infarction: I21.4

## 2014-12-07 LAB — CBC WITH DIFFERENTIAL/PLATELET
BASOS PCT: 0 % (ref 0–1)
Basophils Absolute: 0 10*3/uL (ref 0.0–0.1)
EOS PCT: 0 % (ref 0–5)
Eosinophils Absolute: 0 10*3/uL (ref 0.0–0.7)
HCT: 41.4 % (ref 39.0–52.0)
HEMOGLOBIN: 13.8 g/dL (ref 13.0–17.0)
Lymphocytes Relative: 22 % (ref 12–46)
Lymphs Abs: 2.7 10*3/uL (ref 0.7–4.0)
MCH: 31.2 pg (ref 26.0–34.0)
MCHC: 33.3 g/dL (ref 30.0–36.0)
MCV: 93.7 fL (ref 78.0–100.0)
MONO ABS: 0.6 10*3/uL (ref 0.1–1.0)
MONOS PCT: 5 % (ref 3–12)
Neutro Abs: 8.9 10*3/uL — ABNORMAL HIGH (ref 1.7–7.7)
Neutrophils Relative %: 73 % (ref 43–77)
Platelets: 442 10*3/uL — ABNORMAL HIGH (ref 150–400)
RBC: 4.42 MIL/uL (ref 4.22–5.81)
RDW: 13.3 % (ref 11.5–15.5)
WBC: 12.3 10*3/uL — ABNORMAL HIGH (ref 4.0–10.5)

## 2014-12-07 LAB — URINALYSIS, ROUTINE W REFLEX MICROSCOPIC
BILIRUBIN URINE: NEGATIVE
Glucose, UA: NEGATIVE mg/dL
HGB URINE DIPSTICK: NEGATIVE
Ketones, ur: NEGATIVE mg/dL
Leukocytes, UA: NEGATIVE
NITRITE: NEGATIVE
PROTEIN: 30 mg/dL — AB
SPECIFIC GRAVITY, URINE: 1.02 (ref 1.005–1.030)
Urobilinogen, UA: 1 mg/dL (ref 0.0–1.0)
pH: 7.5 (ref 5.0–8.0)

## 2014-12-07 LAB — COMPREHENSIVE METABOLIC PANEL
ALK PHOS: 78 U/L (ref 38–126)
ALT: 56 U/L (ref 17–63)
ANION GAP: 10 (ref 5–15)
AST: 33 U/L (ref 15–41)
Albumin: 3.8 g/dL (ref 3.5–5.0)
BILIRUBIN TOTAL: 0.5 mg/dL (ref 0.3–1.2)
BUN: 16 mg/dL (ref 6–20)
CHLORIDE: 98 mmol/L — AB (ref 101–111)
CO2: 30 mmol/L (ref 22–32)
CREATININE: 0.84 mg/dL (ref 0.61–1.24)
Calcium: 9.2 mg/dL (ref 8.9–10.3)
GFR calc Af Amer: >60 mL/min (ref >60)
GLUCOSE: 119 mg/dL — AB (ref 65–99)
POTASSIUM: 4.6 mmol/L (ref 3.5–5.1)
Sodium: 138 mmol/L (ref 135–145)
Total Protein: 8.1 g/dL (ref 6.5–8.1)

## 2014-12-07 LAB — TYPE AND SCREEN
ABO/RH(D): O POS
ANTIBODY SCREEN: NEGATIVE

## 2014-12-07 LAB — LIPASE, BLOOD: LIPASE: 16 U/L — AB (ref 22–51)

## 2014-12-07 LAB — URINE MICROSCOPIC-ADD ON

## 2014-12-07 LAB — PROTIME-INR
INR: 1.12 (ref 0.00–1.49)
Prothrombin Time: 14.6 seconds (ref 11.6–15.2)

## 2014-12-07 LAB — POC OCCULT BLOOD, ED: FECAL OCCULT BLD: POSITIVE — AB

## 2014-12-07 LAB — APTT: aPTT: 28 seconds (ref 24–37)

## 2014-12-07 MED ORDER — MORPHINE SULFATE 4 MG/ML IJ SOLN
4.0000 mg | Freq: Once | INTRAMUSCULAR | Status: AC
  Administered 2014-12-07: 4 mg via INTRAVENOUS
  Filled 2014-12-07: qty 1

## 2014-12-07 MED ORDER — PIPERACILLIN-TAZOBACTAM 3.375 G IVPB
3.3750 g | Freq: Once | INTRAVENOUS | Status: AC
  Administered 2014-12-07: 3.375 g via INTRAVENOUS
  Filled 2014-12-07: qty 50

## 2014-12-07 MED ORDER — PIPERACILLIN-TAZOBACTAM 3.375 G IVPB
3.3750 g | Freq: Three times a day (TID) | INTRAVENOUS | Status: DC
  Administered 2014-12-08 – 2014-12-09 (×5): 3.375 g via INTRAVENOUS
  Filled 2014-12-07 (×8): qty 50

## 2014-12-07 MED ORDER — SODIUM CHLORIDE 0.9 % IV SOLN
1000.0000 mL | INTRAVENOUS | Status: DC
  Administered 2014-12-07 – 2014-12-09 (×5): 1000 mL via INTRAVENOUS

## 2014-12-07 MED ORDER — PIPERACILLIN-TAZOBACTAM 3.375 G IVPB
INTRAVENOUS | Status: AC
  Filled 2014-12-07: qty 50

## 2014-12-07 MED ORDER — ONDANSETRON HCL 4 MG PO TABS: 4.0000 mg | ORAL_TABLET | Freq: Four times a day (QID) | ORAL | Status: DC | PRN

## 2014-12-07 MED ORDER — DEXTROSE-NACL 5-0.45 % IV SOLN
INTRAVENOUS | Status: AC
  Administered 2014-12-07: 21:00:00 via INTRAVENOUS

## 2014-12-07 MED ORDER — PROMETHAZINE HCL 25 MG/ML IJ SOLN
12.5000 mg | Freq: Once | INTRAMUSCULAR | Status: AC
  Administered 2014-12-07: 12.5 mg via INTRAVENOUS
  Filled 2014-12-07: qty 1

## 2014-12-07 MED ORDER — ONDANSETRON HCL 4 MG/2ML IJ SOLN
4.0000 mg | Freq: Once | INTRAMUSCULAR | Status: AC
  Administered 2014-12-07: 4 mg via INTRAVENOUS
  Filled 2014-12-07: qty 2

## 2014-12-07 MED ORDER — HYDROMORPHONE HCL 1 MG/ML IJ SOLN: 1.0000 mg | INTRAMUSCULAR | Status: DC | PRN

## 2014-12-07 MED ORDER — METRONIDAZOLE 500 MG PO TABS: 500.0000 mg | ORAL_TABLET | Freq: Once | ORAL | Status: DC

## 2014-12-07 MED ORDER — CIPROFLOXACIN IN D5W 400 MG/200ML IV SOLN: 400.0000 mg | Freq: Once | INTRAVENOUS | Status: DC

## 2014-12-07 MED ORDER — ONDANSETRON HCL 4 MG/2ML IJ SOLN
4.0000 mg | Freq: Four times a day (QID) | INTRAMUSCULAR | Status: DC | PRN
  Administered 2014-12-07 – 2014-12-08 (×2): 4 mg via INTRAVENOUS
  Filled 2014-12-07 (×2): qty 2

## 2014-12-07 MED ORDER — METRONIDAZOLE IN NACL 5-0.79 MG/ML-% IV SOLN: 500.0000 mg | Freq: Three times a day (TID) | INTRAVENOUS | Status: DC

## 2014-12-07 MED ORDER — CIPROFLOXACIN IN D5W 400 MG/200ML IV SOLN: 400.0000 mg | Freq: Two times a day (BID) | INTRAVENOUS | Status: DC

## 2014-12-07 MED ORDER — IOHEXOL 300 MG/ML  SOLN
25.0000 mL | Freq: Once | INTRAMUSCULAR | Status: AC | PRN
  Administered 2014-12-07: 25 mL via ORAL

## 2014-12-07 MED ORDER — IOHEXOL 300 MG/ML  SOLN
100.0000 mL | Freq: Once | INTRAMUSCULAR | Status: AC | PRN
  Administered 2014-12-07: 100 mL via INTRAVENOUS

## 2014-12-07 MED ORDER — SODIUM CHLORIDE 0.9 % IV SOLN
80.0000 mg | Freq: Once | INTRAVENOUS | Status: AC
  Administered 2014-12-07: 80 mg via INTRAVENOUS
  Filled 2014-12-07: qty 80

## 2014-12-07 MED ORDER — HYDRALAZINE HCL 20 MG/ML IJ SOLN: 5.0000 mg | INTRAMUSCULAR | Status: DC | PRN

## 2014-12-07 NOTE — ED Notes (Signed)
EDP at bedside  

## 2014-12-07 NOTE — ED Notes (Signed)
Patient complaining of abdominal pain and vomiting x 1 week.

## 2014-12-07 NOTE — ED Provider Notes (Signed)
CSN: [id number]     Arrival date & time 12/07/14  1110 History   First MD Initiated Contact with Patient 12/07/14 1317     Chief Complaint  Patient presents with  . Abdominal Pain  . Emesis    Patient is a 54 y.o. male presenting with abdominal pain and vomiting. The history is provided by the patient.  Abdominal Pain Pain location:  Generalized Pain quality comment:  He just feels sickly Pain radiates to:  Does not radiate Pain severity:  Moderate Onset quality:  Gradual Duration:  5 days Timing:  Constant Relieved by:  Nothing Associated symptoms: melena, nausea and vomiting   Associated symptoms: no fever, no hematemesis and no hematochezia   Associated symptoms comment:  More than 5 times per day of vomiting, dark liquid Emesis Associated symptoms: abdominal pain   Pt saw his heart doctor on Wednesday.  He had his blood work tested.  He stopped the plavix.  He is taking antacids now but they have not helped.  He tried alk selzer that helped a little bit initially but now it doesn't. He had an appointment scheduled yesterday afternoon with GI but the patient did not know of the appointment.  Past Medical History  Diagnosis Date  . Hypertension   . Bronchitis   . CAD (coronary artery disease)     a. cath 11/26/2014 EF 45%, moderate to severe inferior hypokinesis, 90% small D1, 100% mid to distal RCA with minimal collateral, 40% mid to distal LCx. Medical therapy as RCA occlusion appears to be completed event b. echo 11/27/2014 EF 50-55%  . Hyperlipidemia   . MR (mitral regurgitation)     mild by echo 11/27/2014  . NSTEMI (non-ST elevated myocardial infarction)     11/26/14   Past Surgical History  Procedure Laterality Date  . Neck surgery  2005  . Cardiac catheterization N/A 11/26/2014    Procedure: Left Heart Cath and Coronary Angiography;  Surgeon: Peter M Martinique, MD;  Location: [facility];  Service: Cardiovascular;  Laterality: N/A;  . Colonoscopy      per patient:  done in [place], age 72, couple of polyps.   Family History  Problem Relation Age of Onset  . Heart attack Father   . Colon cancer Neg Hx    History  Substance Use Topics  . Smoking status: Current Every Day Smoker    Types: Cigarettes  . Smokeless tobacco: Not on file  . Alcohol Use: 0.0 oz/week    0 Standard drinks or equivalent per week     Comment: daily for decades: 80-120 ounces of beer daily (noted 12/07/14)    Review of Systems  Constitutional: Negative for fever.  Gastrointestinal: Positive for nausea, vomiting, abdominal pain and melena. Negative for hematochezia and hematemesis.  All other systems reviewed and are negative.     Allergies  Review of patient's allergies indicates no known allergies.  Home Medications   Prior to Admission medications   Medication Sig Start Date End Date Taking? Authorizing Provider  aspirin 81 MG chewable tablet Chew 1 tablet (81 mg total) by mouth daily. 11/28/14  Yes Almyra Deforest, PA  atorvastatin (LIPITOR) 80 MG tablet Take 1 tablet (80 mg total) by mouth daily at 6 PM. 11/28/14  Yes Almyra Deforest, PA  lisinopril (PRINIVIL,ZESTRIL) 2.5 MG tablet Take 1 tablet (2.5 mg total) by mouth daily. 11/28/14  Yes Almyra Deforest, PA  metoprolol tartrate (LOPRESSOR) 25 MG tablet Take 0.5 tablets (12.5 mg total) by mouth  2 (two) times daily. 11/28/14  Yes Almyra Deforest, PA  nitroGLYCERIN (NITROSTAT) 0.4 MG SL tablet Place 1 tablet (0.4 mg total) under the tongue every 5 (five) minutes as needed for chest pain (up to 3 doses). 11/29/14  Yes Dayna N Dunn, PA-C  pantoprazole (PROTONIX) 40 MG tablet Take 1 tablet (40 mg total) by mouth daily. 12/05/14  Yes Imogene Burn, PA-C  Phenyleph-Doxylamine-DM-APAP (ALKA SELTZER PLUS PO) Take 2 tablets by mouth daily as needed (pain).   Yes Historical Provider, MD   BP 149/83 mmHg  Pulse 73  Temp(Src) 98.6 F (37 C) (Oral)  Resp 18  Ht 5' 6"  (1.676 m)  Wt 191 lb (86.637 kg)  BMI 30.84 kg/m2  SpO2 100% Physical Exam   Constitutional: He appears well-developed and well-nourished. No distress.  HENT:  Head: Normocephalic and atraumatic.  Right Ear: External ear normal.  Left Ear: External ear normal.  Eyes: Conjunctivae are normal. Right eye exhibits no discharge. Left eye exhibits no discharge. No scleral icterus.  Neck: Neck supple. No tracheal deviation present.  Cardiovascular: Normal rate, regular rhythm and intact distal pulses.   Pulmonary/Chest: Effort normal and breath sounds normal. No stridor. No respiratory distress. He has no wheezes. He has no rales.  Abdominal: Soft. Bowel sounds are normal. He exhibits no distension. There is generalized tenderness. There is no rigidity, no rebound and no guarding. No hernia.  Genitourinary: Rectal exam shows no mass and no tenderness. Guaiac positive stool.  Dark brown/black  stool  Musculoskeletal: He exhibits no edema or tenderness.  Neurological: He is alert. He has normal strength. No cranial nerve deficit (no facial droop, extraocular movements intact, no slurred speech) or sensory deficit. He exhibits normal muscle tone. He displays no seizure activity. Coordination normal.  Skin: Skin is warm and dry. No rash noted.  Psychiatric: He has a normal mood and affect.  Nursing note and vitals reviewed.   ED Course  Procedures (including critical care time) Labs Review Labs Reviewed  CBC WITH DIFFERENTIAL/PLATELET - Abnormal; Notable for the following:    WBC 12.3 (*)    Platelets 442 (*)    Neutro Abs 8.9 (*)    All other components within normal limits  COMPREHENSIVE METABOLIC PANEL - Abnormal; Notable for the following:    Chloride 98 (*)    Glucose, Bld 119 (*)    All other components within normal limits  LIPASE, BLOOD - Abnormal; Notable for the following:    Lipase 16 (*)    All other components within normal limits  URINALYSIS, ROUTINE W REFLEX MICROSCOPIC (NOT AT [facility]) - Abnormal; Notable for the following:    Protein, ur 30 (*)     All other components within normal limits  POC OCCULT BLOOD, ED - Abnormal; Notable for the following:    Fecal Occult Bld POSITIVE (*)    All other components within normal limits  APTT  PROTIME-INR  URINE MICROSCOPIC-ADD ON  TYPE AND SCREEN    Imaging Review Ct Abdomen Pelvis W Contrast  12/07/2014   CLINICAL DATA:  Abdominal pain and vomiting for 1 week.  EXAM: CT ABDOMEN AND PELVIS WITH CONTRAST  TECHNIQUE: Multidetector CT imaging of the abdomen and pelvis was performed using the standard protocol following bolus administration of intravenous contrast.  CONTRAST:  142m OMNIPAQUE IOHEXOL 300 MG/ML SOLN, 258mOMNIPAQUE IOHEXOL 300 MG/ML SOLN  COMPARISON:  CT abdomen and pelvis 11/21/2003  FINDINGS: Lung bases are clear. No focal nodule, mass, or airspace disease is  evident. The heart size is normal. Coronary artery calcifications are present. No significant pleural or pericardial effusion is evident.  The liver and spleen are within normal limits. A small hiatal hernia is present. The stomach is otherwise unremarkable. The duodenum and pancreas are within normal limits. The common bile duct and gallbladder are normal. Adrenal glands are normal bilaterally. Kidneys and ureters are within normal limits. Urinary bladder is unremarkable. Atherosclerotic calcifications are present in the abdominal aorta and branch vessels without aneurysm.  Inflammatory changes are present about diverticula in the sigmoid colon. There are several small locules of gas outside the lumen of the colon suggesting perforation. No abscess formation is evident. The more proximal colon is within normal limits. There is no obstruction. Diverticular changes are present in the ascending proximal transverse colon is well. The appendix is visualized and normal. Small bowel is unremarkable.  Bone windows demonstrate facet degenerative changes most prominent at L5-S1. No focal lytic or blastic lesions are evident.  IMPRESSION: 1.  Diverticulitis with extraluminal gas suggesting perforation but no abscess. 2. Atherosclerosis of the abdominal aorta and branch vessels without aneurysm. 3. Coronary artery disease.   Electronically Signed   By: San Morelle M.D.   On: 12/07/2014 16:22   Dg Chest Portable 1 View  12/07/2014   CLINICAL DATA:  Abdominal pain. Coronary artery disease and hypertension.  EXAM: PORTABLE CHEST - 1 VIEW  COMPARISON:  None.  FINDINGS: The heart size and mediastinal contours are within normal limits. Both lungs are clear. The visualized skeletal structures are unremarkable.  IMPRESSION: No active disease.   Electronically Signed   By: Kerby Moors M.D.   On: 12/07/2014 14:06    Medications  0.9 %  sodium chloride infusion (1,000 mLs Intravenous New Bag/Given 12/07/14 1451)  piperacillin-tazobactam (ZOSYN) IVPB 3.375 g (not administered)  ondansetron (ZOFRAN) injection 4 mg (4 mg Intravenous Given 12/07/14 1346)  pantoprazole (PROTONIX) 80 mg in sodium chloride 0.9 % 100 mL IVPB (0 mg Intravenous Stopped 12/07/14 1510)  morphine 4 MG/ML injection 4 mg (4 mg Intravenous Given 12/07/14 1346)  iohexol (OMNIPAQUE) 300 MG/ML solution 25 mL (25 mLs Oral Contrast Given 12/07/14 1543)  iohexol (OMNIPAQUE) 300 MG/ML solution 100 mL (100 mLs Intravenous Contrast Given 12/07/14 1543)  promethazine (PHENERGAN) injection 12.5 mg (12.5 mg Intravenous Given 12/07/14 1645)     MDM   Final diagnoses:  Abdominal pain  Diverticulitis of large intestine with perforation with bleeding    CT scans shows diverticulitis with perforation.  Pain meds, IV abx given.  Will consult with general surgery regarding admission.  D/w Dr Donne Hazel.  Plan on admission, IV abx.  Zosyn rather than cipro flagyl. If patient improves, will not end up needing surgery.  If he does need surgery with his recent heart issues that will need to be done at [facility] hospital.  I will consult with hospitalist regarding admission.    Dorie Rank,  MD 12/07/14 [phone]

## 2014-12-07 NOTE — ED Notes (Signed)
Dr Gosrani at bedside. 

## 2014-12-07 NOTE — Consult Note (Signed)
Referring Provider: Linwood Dibbles, MD Primary Care Physician:  Isabella Stalling, MD Primary Gastroenterologist:  Roetta Sessions, MD  Reason for Consultation:  abd pain, vomiting, heme + stool, melena  HPI: Rodney Mahoney is a 54 y.o. male presented to ER with complaints of abdominal discomfort, N/V, black stools. Symptoms began one week ago. Recent NSTEMI 11/26/14. CTA of the chest was negative for PE. He underwent cardiac catheterization which showed an EF of 45% with moderate to severe inferior hypokinesis, 90% small diagonal 1, total mid to distal RCA occlusion with minimal collaterals, 40% mid to distal left circumflex.The RCA occlusion appeared to be a completed event so PCI was not performed at medical therapy recommended including Plavix and ASA. Saw cardiology in follow up on Wednesday. Was complaining of severe N/V, black stools. Plavix was stopped and Protonix was started. Patient reports last took ASA and Plavix on Wednesday. Attempted expedited appointment with [place] yesterday but we were unable to reach him.   Patient states he was fine until last Friday. N/V started. Denies any significant abdominal pain. States his stomach just feels "sick all over". Able to keep very little down. Denies hematemesis or coffee ground emesis. States he has been passing black stools, last one over 24 hours ago. This morning small dark brown one. DRE in ER, heme + stool dark brown/black stool. Patient denies Pepto. Takes occasional ibuprofen but denies significant NSAID use. No ASA powders. Recently started ASA  daily and Plavix. Denies heartburn, dysphagia, brbpr.   Colonoscopy in [place] 4 years ago, couple of polyps removed. Believes he has had EGD but doesn't recall for sure. Denies h/o PUD or pancreatitis.   ETOH abuse for decades. Drinks 80-120 ounces of beer daily depending on if he is working. No illicit drugs.     Prior to Admission medications   Medication Sig Start Date End Date Taking?  Authorizing Provider  aspirin 81 MG chewable tablet Chew 1 tablet (81 mg total) by mouth daily. 11/28/14  Yes Azalee Course, PA  atorvastatin (LIPITOR) 80 MG tablet Take 1 tablet (80 mg total) by mouth daily at 6 PM. 11/28/14  Yes Azalee Course, PA  lisinopril (PRINIVIL,ZESTRIL) 2.5 MG tablet Take 1 tablet (2.5 mg total) by mouth daily. 11/28/14  Yes Azalee Course, PA  metoprolol tartrate (LOPRESSOR) 25 MG tablet Take 0.5 tablets (12.5 mg total) by mouth 2 (two) times daily. 11/28/14  Yes Azalee Course, PA  nitroGLYCERIN (NITROSTAT) 0.4 MG SL tablet Place 1 tablet (0.4 mg total) under the tongue every 5 (five) minutes as needed for chest pain (up to 3 doses). 11/29/14  Yes Dayna N Dunn, PA-C  pantoprazole (PROTONIX) 40 MG tablet Take 1 tablet (40 mg total) by mouth daily. 12/05/14  Yes Dyann Kief, PA-C  Phenyleph-Doxylamine-DM-APAP (ALKA SELTZER PLUS PO) Take 2 tablets by mouth daily as needed (pain).   Yes Historical Provider, MD    Current Facility-Administered Medications  Medication Dose Route Frequency Provider Last Rate Last Dose  . 0.9 %  sodium chloride infusion  1,000 mL Intravenous Continuous Linwood Dibbles, MD 125 mL/hr at 12/07/14 1451 1,000 mL at 12/07/14 1451  . promethazine (PHENERGAN) injection 12.5 mg  12.5 mg Intravenous Once Tiffany Kocher, PA-C       Current Outpatient Prescriptions  Medication Sig Dispense Refill  . aspirin 81 MG chewable tablet Chew 1 tablet (81 mg total) by mouth daily.    Marland Kitchen atorvastatin (LIPITOR) 80 MG tablet Take 1 tablet (80 mg total) by mouth daily  at 6 PM. 30 tablet 11  . lisinopril (PRINIVIL,ZESTRIL) 2.5 MG tablet Take 1 tablet (2.5 mg total) by mouth daily. 30 tablet 11  . metoprolol tartrate (LOPRESSOR) 25 MG tablet Take 0.5 tablets (12.5 mg total) by mouth 2 (two) times daily. 30 tablet 11  . nitroGLYCERIN (NITROSTAT) 0.4 MG SL tablet Place 1 tablet (0.4 mg total) under the tongue every 5 (five) minutes as needed for chest pain (up to 3 doses). 25 tablet 3  . pantoprazole  (PROTONIX) 40 MG tablet Take 1 tablet (40 mg total) by mouth daily. 30 tablet 11  . Phenyleph-Doxylamine-DM-APAP (ALKA SELTZER PLUS PO) Take 2 tablets by mouth daily as needed (pain).      Allergies as of 12/07/2014  . (No Known Allergies)    Past Medical History  Diagnosis Date  . Hypertension   . Bronchitis   . CAD (coronary artery disease)     a. cath 11/26/2014 EF 45%, moderate to severe inferior hypokinesis, 90% small D1, 100% mid to distal RCA with minimal collateral, 40% mid to distal LCx. Medical therapy as RCA occlusion appears to be completed event b. echo 11/27/2014 EF 50-55%  . Hyperlipidemia   . MR (mitral regurgitation)     mild by echo 11/27/2014  . NSTEMI (non-ST elevated myocardial infarction)     11/26/14    Past Surgical History  Procedure Laterality Date  . Neck surgery  2005  . Cardiac catheterization N/A 11/26/2014    Procedure: Left Heart Cath and Coronary Angiography;  Surgeon: Peter M Swaziland, MD;  Location: [facility] INVASIVE CV LAB;  Service: Cardiovascular;  Laterality: N/A;  . Colonoscopy      per patient: done in [place], age 46, couple of polyps.    Family History  Problem Relation Age of Onset  . Heart attack Father   . Colon cancer Neg Hx     History   Social History  . Marital Status: Married    Spouse Name: N/A  . Number of Children: N/A  . Years of Education: N/A   Occupational History  . Not on file.   Social History Main Topics  . Smoking status: Current Every Day Smoker    Types: Cigarettes  . Smokeless tobacco: Not on file  . Alcohol Use: 0.0 oz/week    0 Standard drinks or equivalent per week     Comment: daily for decades: 80-120 ounces of beer daily (noted 12/07/14)  . Drug Use: No  . Sexual Activity: Yes    Birth Control/ Protection: None   Other Topics Concern  . Not on file   Social History Narrative     ROS:  General: Negative for anorexia, weight loss, fever, chills, fatigue, weakness. Eyes: Negative for vision changes.   ENT: Negative for hoarseness, difficulty swallowing , nasal congestion. CV: Negative for chest pain, angina, palpitations, dyspnea on exertion, peripheral edema.  Respiratory: Negative for dyspnea at rest, dyspnea on exertion, cough, sputum, wheezing.  GI: See history of present illness. GU:  Negative for dysuria, hematuria, urinary incontinence, urinary frequency, nocturnal urination.  MS: Negative for joint pain, low back pain.  Derm: Negative for rash or itching.  Neuro: Negative for weakness, abnormal sensation, seizure, frequent headaches, memory loss, confusion.  Psych: Negative for anxiety, depression, suicidal ideation, hallucinations.  Endo: Negative for unusual weight change.  Heme: Negative for bruising or bleeding. Allergy: Negative for rash or hives.       Physical Examination: Vital signs in last 24 hours: Temp:  [98.6  F (37 C)-98.7 F (37.1 C)] 98.6 F (37 C) (06/24 1521) Pulse Rate:  [65-75] 73 (06/24 1615) Resp:  [16-19] 18 (06/24 1630) BP: (142-156)/(71-90) 149/83 mmHg (06/24 1630) SpO2:  [92 %-100 %] 100 % (06/24 1615) Weight:  [191 lb (86.637 kg)] 191 lb (86.637 kg) (06/24 1118)    General: Well-nourished, well-developed in no acute distress.  Head: Normocephalic, atraumatic.   Eyes: Conjunctiva pink, no icterus. Mouth: Oropharyngeal mucosa moist and pink , no lesions erythema or exudate. Neck: Supple without thyromegaly, masses, or lymphadenopathy.  Lungs: Clear to auscultation bilaterally.  Heart: Regular rate and rhythm, no murmurs rubs or gallops.  Abdomen: Bowel sounds are normal, nontender, nondistended, no hepatosplenomegaly or masses, no abdominal bruits or    hernia , no rebound or guarding.   Rectal: not performed Extremities: No lower extremity edema, clubbing, deformity.  Neuro: Alert and oriented x 4 , grossly normal neurologically.  Skin: Warm and dry, no rash or jaundice.   Psych: Alert and cooperative, normal mood and affect.         Intake/Output from previous day:   Intake/Output this shift:    Lab Results: CBC  Recent Labs  12/05/14 1300 12/07/14 1404  WBC 14.3* 12.3*  HGB 14.0 13.8  HCT 41.4 41.4  MCV 93.5 93.7  PLT 394 442*   BMET  Recent Labs  12/05/14 1300 12/07/14 1404  NA 138 138  K 4.3 4.6  CL 100* 98*  CO2 27 30  GLUCOSE 111* 119*  BUN 13 16  CREATININE 0.78 0.84  CALCIUM 9.5 9.2   LFT  Recent Labs  12/07/14 1404  BILITOT 0.5  ALKPHOS 78  AST 33  ALT 56  PROT 8.1  ALBUMIN 3.8    Lipase  Recent Labs  12/07/14 1404  LIPASE 16*    PT/INR  Recent Labs  12/07/14 1405  LABPROT 14.6  INR 1.12      Imaging Studies: Dg Chest 2 View  11/26/2014   CLINICAL DATA:  Chest pain.  EXAM: CHEST  2 VIEW  COMPARISON:  11/02/2009 and 11/26/2003  FINDINGS: The heart size and mediastinal contours are within normal limits.  There is a small vague area of increased density at the right lung base which may represent a confluence of normal structures. Lungs are otherwise clear. No acute osseous abnormality.  IMPRESSION: Small vague density at the right lung base on the PA view. I recommend a repeat PA view, slightly oblique for further evaluation.   Electronically Signed   By: Francene Boyers M.D.   On: 11/26/2014 08:02   Ct Angio Chest Pe W/cm &/or Wo Cm  11/26/2014   CLINICAL DATA:  Mid chest pain for 3 days.  Question PE.  EXAM: CT ANGIOGRAPHY CHEST WITH CONTRAST  TECHNIQUE: Multidetector CT imaging of the chest was performed using the standard protocol during bolus administration of intravenous contrast. Multiplanar CT image reconstructions and MIPs were obtained to evaluate the vascular anatomy.  CONTRAST:  OMNIPAQUE IOHEXOL 350 MG/ML SOLN  COMPARISON:  None.  FINDINGS: Mediastinum/Nodes: Negative for pulmonary embolus. Mediastinal lymph nodes are not enlarged by CT size criteria. No hilar or axillary adenopathy atherosclerotic calcification of the arterial vasculature. Pulmonary  arteries are slightly enlarged. Heart is normal in size. No pericardial effusion.  Lungs/Pleura: Mild upper lung zone predominant centrilobular nodularity, indicative of respiratory bronchiolitis in this patient who is a current everyday smoker. Trace emphysematous change at the apices. No pleural fluid. Airway is unremarkable.  Upper abdomen:  Visualized portion of the liver appears slightly decreased in attenuation diffusely. Visualized portions of the adrenal glands, kidneys, spleen, pancreas and stomach are grossly unremarkable. No upper abdominal adenopathy.  Musculoskeletal: No worrisome lytic or sclerotic lesions. Degenerative changes are seen in the spine.  Review of the MIP images confirms the above findings.  IMPRESSION: 1. Negative for pulmonary embolus. 2. Pulmonary arteries are slightly enlarged, indicative of pulmonary arterial hypertension.   Electronically Signed   By: Leanna Battles M.D.   On: 11/26/2014 09:16   Ct Abdomen Pelvis W Contrast  12/07/2014   CLINICAL DATA:  Abdominal pain and vomiting for 1 week.  EXAM: CT ABDOMEN AND PELVIS WITH CONTRAST  TECHNIQUE: Multidetector CT imaging of the abdomen and pelvis was performed using the standard protocol following bolus administration of intravenous contrast.  CONTRAST:  OMNIPAQUE IOHEXOL 300 MG/ML SOLN, 25mL OMNIPAQUE IOHEXOL 300 MG/ML SOLN  COMPARISON:  CT abdomen and pelvis 11/21/2003  FINDINGS: Lung bases are clear. No focal nodule, mass, or airspace disease is evident. The heart size is normal. Coronary artery calcifications are present. No significant pleural or pericardial effusion is evident.  The liver and spleen are within normal limits. A small hiatal hernia is present. The stomach is otherwise unremarkable. The duodenum and pancreas are within normal limits. The common bile duct and gallbladder are normal. Adrenal glands are normal bilaterally. Kidneys and ureters are within normal limits. Urinary bladder is unremarkable.  Atherosclerotic calcifications are present in the abdominal aorta and branch vessels without aneurysm.  Inflammatory changes are present about diverticula in the sigmoid colon. There are several small locules of gas outside the lumen of the colon suggesting perforation. No abscess formation is evident. The more proximal colon is within normal limits. There is no obstruction. Diverticular changes are present in the ascending proximal transverse colon is well. The appendix is visualized and normal. Small bowel is unremarkable.  Bone windows demonstrate facet degenerative changes most prominent at L5-S1. No focal lytic or blastic lesions are evident.  IMPRESSION: 1. Diverticulitis with extraluminal gas suggesting perforation but no abscess. 2. Atherosclerosis of the abdominal aorta and branch vessels without aneurysm. 3. Coronary artery disease.   Electronically Signed   By: Marin Roberts M.D.   On: 12/07/2014 16:22   Dg Chest Portable 1 View  12/07/2014   CLINICAL DATA:  Abdominal pain. Coronary artery disease and hypertension.  EXAM: PORTABLE CHEST - 1 VIEW  COMPARISON:  None.  FINDINGS: The heart size and mediastinal contours are within normal limits. Both lungs are clear. The visualized skeletal structures are unremarkable.  IMPRESSION: No active disease.   Electronically Signed   By: Signa Kell M.D.   On: 12/07/2014 14:06  [4 week]   Impression: 54 y/o male with recent NSTEMI 11/26/14 started on Plavix/ASA at that time who presents with one week h/o N/V, black stools, abdominal discomfort. Heme + on rectal exam in the ER. CT A/P with contrast shows diverticulitis with several small locules of gas outside the lumen of the colon. No abscess seen. Patient chronically abuses etoh but no evidence of overt cirrhosis on CT. Stomach appears normal on CT as well. Hemoglobin stable. Doubt significant GI bleed at this time.   Plan: Discussed CT findings with Dr. Jena Gauss.  1. Patient at risk for abscess  formation therefore recommend antibiotic therapy.  2. Agree with surgical consultation. Patient had recent NSTEMI making him higher risk for surgery so watching medical management likely to be recommended. 3. NPO.  Spoke with Dr. Lynelle Doctor. Plans for admission here at [facility] with medical management for now. If need for surgery arises, then he would be transferred to [facility] given recent heart issues.   We would like to thank you for the opportunity to participate in the care of Rodney Mahoney.  Leanna Battles. [facility] [phone] 6/24/20165:07 PM  Attending note:  Pt seen and examined in ED room 8.  Reviewd CT with Dr. Tyron Russell.  He has minimal LLQ pain to palpation. No rebound.  Zosyn started. Agree with above impression and recommendations.

## 2014-12-07 NOTE — Consult Note (Signed)
Reason for Consult:abd pain Referring Physician: Dr Shirleen Schirmer is an 54 y.o. male.  HPI: 44 yom with one week of abdominal pain, n/v, black stools.  He does have recent [facility] for nstemi with cardiac cath. Begin treated medially with plavix/asa.  Had plavix stopped earlier this week by cards as they thought symptoms might be related to that. They persisted.  He states not eating much for last week. Is having emesis. Passing some flatus and some minimal stool.  No fevers.  Had prior colonoscopy several years ago that he says only had a couple of polyps. States he stopped smoking and drinking last Friday.   Has history of acdf. Underwent ct with what appears to be localized perforated diverticular disease  Past Medical History  Diagnosis Date  . Hypertension   . Bronchitis   . CAD (coronary artery disease)     a. cath 11/26/2014 EF 45%, moderate to severe inferior hypokinesis, 90% small D1, 100% mid to distal RCA with minimal collateral, 40% mid to distal LCx. Medical therapy as RCA occlusion appears to be completed event b. echo 11/27/2014 EF 50-55%  . Hyperlipidemia   . MR (mitral regurgitation)     mild by echo 11/27/2014  . NSTEMI (non-ST elevated myocardial infarction)     11/26/14    Past Surgical History  Procedure Laterality Date  . Neck surgery  2005  . Cardiac catheterization N/A 11/26/2014    Procedure: Left Heart Cath and Coronary Angiography;  Surgeon: Peter M Martinique, MD;  Location: [place] CV LAB;  Service: Cardiovascular;  Laterality: N/A;  . Colonoscopy      per patient: done in [place], age 12, couple of polyps.    Family History  Problem Relation Age of Onset  . Heart attack Father   . Colon cancer Neg Hx     Social History:  reports that he has been smoking Cigarettes.  He does not have any smokeless tobacco history on file. He reports that he drinks alcohol. He reports that he does not use illicit drugs.  Allergies: No Known  Allergies  Medications: I have reviewed the patient's current medications.  Results for orders placed or performed during the hospital encounter of 12/07/14 (from the past 48 hour(s))  Urinalysis, Routine w reflex microscopic (not at [facility])     Status: Abnormal   Collection Time: 12/07/14  1:39 PM  Result Value Ref Range   Color, Urine YELLOW YELLOW   APPearance CLEAR CLEAR   Specific Gravity, Urine 1.020 1.005 - 1.030   pH 7.5 5.0 - 8.0   Glucose, UA NEGATIVE NEGATIVE mg/dL   Hgb urine dipstick NEGATIVE NEGATIVE   Bilirubin Urine NEGATIVE NEGATIVE   Ketones, ur NEGATIVE NEGATIVE mg/dL   Protein, ur 30 (A) NEGATIVE mg/dL   Urobilinogen, UA 1.0 0.0 - 1.0 mg/dL   Nitrite NEGATIVE NEGATIVE   Leukocytes, UA NEGATIVE NEGATIVE  Urine microscopic-add on     Status: None   Collection Time: 12/07/14  1:39 PM  Result Value Ref Range   RBC / HPF 0-2 <3 RBC/hpf   Urine-Other MUCOUS PRESENT   POC occult blood, ED Provider will collect     Status: Abnormal   Collection Time: 12/07/14  1:59 PM  Result Value Ref Range   Fecal Occult Bld POSITIVE (A) NEGATIVE  CBC WITH DIFFERENTIAL     Status: Abnormal   Collection Time: 12/07/14  2:04 PM  Result Value Ref Range   WBC 12.3 (H)  4.0 - 10.5 K/uL   RBC 4.42 4.22 - 5.81 MIL/uL   Hemoglobin 13.8 13.0 - 17.0 g/dL   HCT 41.4 39.0 - 52.0 %   MCV 93.7 78.0 - 100.0 fL   MCH 31.2 26.0 - 34.0 pg   MCHC 33.3 30.0 - 36.0 g/dL   RDW 13.3 11.5 - 15.5 %   Platelets 442 (H) 150 - 400 K/uL   Neutrophils Relative % 73 43 - 77 %   Neutro Abs 8.9 (H) 1.7 - 7.7 K/uL   Lymphocytes Relative 22 12 - 46 %   Lymphs Abs 2.7 0.7 - 4.0 K/uL   Monocytes Relative 5 3 - 12 %   Monocytes Absolute 0.6 0.1 - 1.0 K/uL   Eosinophils Relative 0 0 - 5 %   Eosinophils Absolute 0.0 0.0 - 0.7 K/uL   Basophils Relative 0 0 - 1 %   Basophils Absolute 0.0 0.0 - 0.1 K/uL  Comprehensive metabolic panel     Status: Abnormal   Collection Time: 12/07/14  2:04 PM  Result Value Ref  Range   Sodium 138 135 - 145 mmol/L   Potassium 4.6 3.5 - 5.1 mmol/L   Chloride 98 (L) 101 - 111 mmol/L   CO2 30 22 - 32 mmol/L   Glucose, Bld 119 (H) 65 - 99 mg/dL   BUN 16 6 - 20 mg/dL   Creatinine, Ser 0.84 0.61 - 1.24 mg/dL   Calcium 9.2 8.9 - 10.3 mg/dL   Total Protein 8.1 6.5 - 8.1 g/dL   Albumin 3.8 3.5 - 5.0 g/dL   AST 33 15 - 41 U/L   ALT 56 17 - 63 U/L   Alkaline Phosphatase 78 38 - 126 U/L   Total Bilirubin 0.5 0.3 - 1.2 mg/dL   GFR calc non Af Amer >60 >60 mL/min   GFR calc Af Amer >60 >60 mL/min    Comment: (NOTE) The eGFR has been calculated using the CKD EPI equation. This calculation has not been validated in all clinical situations. eGFR's persistently <60 mL/min signify possible Chronic Kidney Disease.    Anion gap 10 5 - 15  Lipase, blood     Status: Abnormal   Collection Time: 12/07/14  2:04 PM  Result Value Ref Range   Lipase 16 (L) 22 - 51 U/L  APTT     Status: None   Collection Time: 12/07/14  2:05 PM  Result Value Ref Range   aPTT 28 24 - 37 seconds  Protime-INR     Status: None   Collection Time: 12/07/14  2:05 PM  Result Value Ref Range   Prothrombin Time 14.6 11.6 - 15.2 seconds   INR 1.12 0.00 - 1.49  Type and screen     Status: None   Collection Time: 12/07/14  2:05 PM  Result Value Ref Range   ABO/RH(D) O POS    Antibody Screen NEG    Sample Expiration 12/10/2014     Ct Abdomen Pelvis W Contrast  12/07/2014   CLINICAL DATA:  Abdominal pain and vomiting for 1 week.  EXAM: CT ABDOMEN AND PELVIS WITH CONTRAST  TECHNIQUE: Multidetector CT imaging of the abdomen and pelvis was performed using the standard protocol following bolus administration of intravenous contrast.  CONTRAST:  137m OMNIPAQUE IOHEXOL 300 MG/ML SOLN, 268mOMNIPAQUE IOHEXOL 300 MG/ML SOLN  COMPARISON:  CT abdomen and pelvis 11/21/2003  FINDINGS: Lung bases are clear. No focal nodule, mass, or airspace disease is evident. The heart size is normal. Coronary  artery calcifications  are present. No significant pleural or pericardial effusion is evident.  The liver and spleen are within normal limits. A small hiatal hernia is present. The stomach is otherwise unremarkable. The duodenum and pancreas are within normal limits. The common bile duct and gallbladder are normal. Adrenal glands are normal bilaterally. Kidneys and ureters are within normal limits. Urinary bladder is unremarkable. Atherosclerotic calcifications are present in the abdominal aorta and branch vessels without aneurysm.  Inflammatory changes are present about diverticula in the sigmoid colon. There are several small locules of gas outside the lumen of the colon suggesting perforation. No abscess formation is evident. The more proximal colon is within normal limits. There is no obstruction. Diverticular changes are present in the ascending proximal transverse colon is well. The appendix is visualized and normal. Small bowel is unremarkable.  Bone windows demonstrate facet degenerative changes most prominent at L5-S1. No focal lytic or blastic lesions are evident.  IMPRESSION: 1. Diverticulitis with extraluminal gas suggesting perforation but no abscess. 2. Atherosclerosis of the abdominal aorta and branch vessels without aneurysm. 3. Coronary artery disease.   Electronically Signed   By: San Morelle M.D.   On: 12/07/2014 16:22   Dg Chest Portable 1 View  12/07/2014   CLINICAL DATA:  Abdominal pain. Coronary artery disease and hypertension.  EXAM: PORTABLE CHEST - 1 VIEW  COMPARISON:  None.  FINDINGS: The heart size and mediastinal contours are within normal limits. Both lungs are clear. The visualized skeletal structures are unremarkable.  IMPRESSION: No active disease.   Electronically Signed   By: Kerby Moors M.D.   On: 12/07/2014 14:06    Review of Systems  Constitutional: Negative for fever and chills.  Respiratory: Negative for shortness of breath.   Cardiovascular: Negative for chest pain.   Gastrointestinal: Positive for heartburn, nausea, vomiting, abdominal pain and melena. Negative for constipation.  Genitourinary: Negative for dysuria, urgency and frequency.   Blood pressure 129/74, pulse 70, temperature 98.6 F (37 C), temperature source Oral, resp. rate 17, height _0  (1.676 m), weight 86.637 kg (191 lb), SpO2 99 %. Physical Exam  Vitals reviewed. Constitutional: He is oriented to person, place, and time. He appears well-developed and well-nourished.  HENT:  Head: Normocephalic and atraumatic.  Eyes: No scleral icterus.  Neck: Neck supple.  Cardiovascular: Normal rate, regular rhythm and normal heart sounds.   Respiratory: Effort normal and breath sounds normal. He has no wheezes. He has no rales.  GI: Soft. Bowel sounds are normal. He exhibits no distension. There is tenderness (llq ).  Musculoskeletal: He exhibits no edema.  Neurological: He is alert and oriented to person, place, and time.    Assessment/Plan: Diverticulitis, localized perforation His wbc is normal, symptoms localized to llq, vitals all normal.  I think conservative treatment with zosyn given complicated disease would be appropriate and making him npo. Recheck wbc in am and will reassess. Hopefully gets better with iv abx. If he does not may end up needing a drain or surgery.   Emine Lopata 12/07/2014, 6:23 PM

## 2014-12-07 NOTE — H&P (Signed)
[facility] History and Physical  HOLBERT CAPLES ZOX:[id number] DOB: 1961-05-11 DOA: 12/07/2014  Referring physician: ER, Dr. Lynelle Doctor PCP: Isabella Stalling, MD   Chief Complaint: Abdominal pain. Nausea and vomiting, black stools.  HPI: AVANEESH PEPITONE is a 54 y.o. male  This is a 53 year old man who recently was admitted to [facility] with non-ST elevation MI approximately couple weeks ago and underwent cardiac catheterization. The plan was for medical management only as you're he had thrombosis in one of the major coronary arteries. It was felt that this was a completed MI. He was treated with aspirin and Plavix. Over the last week he has been vomiting, poor by mouth intake and also has noticed some black stools. There is been no fevers. Evaluation in the emergency room hospital shows him to have diverticulitis with localized perforation. He is now being admitted for further management.   Review of Systems:  Apart from symptoms above, all systems negative.  Past Medical History  Diagnosis Date  . Hypertension   . Bronchitis   . CAD (coronary artery disease)     a. cath 11/26/2014 EF 45%, moderate to severe inferior hypokinesis, 90% small D1, 100% mid to distal RCA with minimal collateral, 40% mid to distal LCx. Medical therapy as RCA occlusion appears to be completed event b. echo 11/27/2014 EF 50-55%  . Hyperlipidemia   . MR (mitral regurgitation)     mild by echo 11/27/2014  . NSTEMI (non-ST elevated myocardial infarction)     11/26/14   Past Surgical History  Procedure Laterality Date  . Neck surgery  2005  . Cardiac catheterization N/A 11/26/2014    Procedure: Left Heart Cath and Coronary Angiography;  Surgeon: Peter M [place], MD;  Location: [facility] INVASIVE CV LAB;  Service: Cardiovascular;  Laterality: N/A;  . Colonoscopy      per patient: done in [place], age 4, couple of polyps.   Social History:  reports that he has been smoking Cigarettes.  He does not have  any smokeless tobacco history on file. He reports that he drinks alcohol. He reports that he does not use illicit drugs.  No Known Allergies  Family History  Problem Relation Age of Onset  . Heart attack Father   . Colon cancer Neg Hx     Prior to Admission medications   Medication Sig Start Date End Date Taking? Authorizing Provider  aspirin 81 MG chewable tablet Chew 1 tablet (81 mg total) by mouth daily. 11/28/14  Yes Azalee Course, PA  atorvastatin (LIPITOR) 80 MG tablet Take 1 tablet (80 mg total) by mouth daily at 6 PM. 11/28/14  Yes Azalee Course, PA  lisinopril (PRINIVIL,ZESTRIL) 2.5 MG tablet Take 1 tablet (2.5 mg total) by mouth daily. 11/28/14  Yes Azalee Course, PA  metoprolol tartrate (LOPRESSOR) 25 MG tablet Take 0.5 tablets (12.5 mg total) by mouth 2 (two) times daily. 11/28/14  Yes Azalee Course, PA  nitroGLYCERIN (NITROSTAT) 0.4 MG SL tablet Place 1 tablet (0.4 mg total) under the tongue every 5 (five) minutes as needed for chest pain (up to 3 doses). 11/29/14  Yes Dayna N Dunn, PA-C  pantoprazole (PROTONIX) 40 MG tablet Take 1 tablet (40 mg total) by mouth daily. 12/05/14  Yes Dyann Kief, PA-C  Phenyleph-Doxylamine-DM-APAP (ALKA SELTZER PLUS PO) Take 2 tablets by mouth daily as needed (pain).   Yes Historical Provider, MD   Physical Exam: Filed Vitals:   12/07/14 1700 12/07/14 1730 12/07/14 1800 12/07/14 1815  BP: 141/69 131/75  129/74   Pulse:  68 71 70  Temp:      TempSrc:      Resp: 18 19 18 17   Height:      Weight:      SpO2:  96% 94% 99%    Wt Readings from Last 3 Encounters:  12/07/14 86.637 kg (191 lb)  12/05/14 86.637 kg (191 lb)  11/28/14 88.769 kg (195 lb 11.2 oz)    General:  Appears calm and comfortable. He appears to be unwell and somewhat in pain. He does not look toxic or septic clinically. Eyes: PERRL, normal lids, irises & conjunctiva ENT: grossly normal hearing, lips & tongue Neck: no LAD, masses or thyromegaly Cardiovascular: RRR, no m/r/g. No LE  edema. Telemetry: SR, no arrhythmias  Respiratory: CTA bilaterally, no w/r/r. Normal respiratory effort. Abdomen: soft, ntnd. Surprisingly his abdomen is nontender. Bowel sounds are scanty. Skin: no rash or induration seen on limited exam Musculoskeletal: grossly normal tone BUE/BLE Psychiatric: grossly normal mood and affect, speech fluent and appropriate Neurologic: grossly non-focal.          Labs on Admission:  Basic Metabolic Panel:  Recent Labs Lab 12/05/14 1300 12/07/14 1404  NA 138 138  K 4.3 4.6  CL 100* 98*  CO2 27 30  GLUCOSE 111* 119*  BUN 13 16  CREATININE 0.78 0.84  CALCIUM 9.5 9.2   Liver Function Tests:  Recent Labs Lab 12/07/14 1404  AST 33  ALT 56  ALKPHOS 78  BILITOT 0.5  PROT 8.1  ALBUMIN 3.8    Recent Labs Lab 12/07/14 1404  LIPASE 16*   No results for input(s): AMMONIA in the last 168 hours. CBC:  Recent Labs Lab 12/05/14 1300 12/07/14 1404  WBC 14.3* 12.3*  NEUTROABS 9.2* 8.9*  HGB 14.0 13.8  HCT 41.4 41.4  MCV 93.5 93.7  PLT 394 442*   Cardiac Enzymes: No results for input(s): CKTOTAL, CKMB, CKMBINDEX, TROPONINI in the last 168 hours.  BNP (last 3 results) No results for input(s): BNP in the last 8760 hours.  ProBNP (last 3 results) No results for input(s): PROBNP in the last 8760 hours.  CBG: No results for input(s): GLUCAP in the last 168 hours.  Radiological Exams on Admission: Ct Abdomen Pelvis W Contrast  12/07/2014   CLINICAL DATA:  Abdominal pain and vomiting for 1 week.  EXAM: CT ABDOMEN AND PELVIS WITH CONTRAST  TECHNIQUE: Multidetector CT imaging of the abdomen and pelvis was performed using the standard protocol following bolus administration of intravenous contrast.  CONTRAST:  OMNIPAQUE IOHEXOL 300 MG/ML SOLN, 56mL OMNIPAQUE IOHEXOL 300 MG/ML SOLN  COMPARISON:  CT abdomen and pelvis 11/21/2003  FINDINGS: Lung bases are clear. No focal nodule, mass, or airspace disease is evident. The heart size is  normal. Coronary artery calcifications are present. No significant pleural or pericardial effusion is evident.  The liver and spleen are within normal limits. A small hiatal hernia is present. The stomach is otherwise unremarkable. The duodenum and pancreas are within normal limits. The common bile duct and gallbladder are normal. Adrenal glands are normal bilaterally. Kidneys and ureters are within normal limits. Urinary bladder is unremarkable. Atherosclerotic calcifications are present in the abdominal aorta and branch vessels without aneurysm.  Inflammatory changes are present about diverticula in the sigmoid colon. There are several small locules of gas outside the lumen of the colon suggesting perforation. No abscess formation is evident. The more proximal colon is within normal limits. There is no obstruction. Diverticular changes  are present in the ascending proximal transverse colon is well. The appendix is visualized and normal. Small bowel is unremarkable.  Bone windows demonstrate facet degenerative changes most prominent at L5-S1. No focal lytic or blastic lesions are evident.  IMPRESSION: 1. Diverticulitis with extraluminal gas suggesting perforation but no abscess. 2. Atherosclerosis of the abdominal aorta and branch vessels without aneurysm. 3. Coronary artery disease.   Electronically Signed   By: Marin Roberts M.D.   On: 12/07/2014 16:22   Dg Chest Portable 1 View  12/07/2014   CLINICAL DATA:  Abdominal pain. Coronary artery disease and hypertension.  EXAM: PORTABLE CHEST - 1 VIEW  COMPARISON:  None.  FINDINGS: The heart size and mediastinal contours are within normal limits. Both lungs are clear. The visualized skeletal structures are unremarkable.  IMPRESSION: No active disease.   Electronically Signed   By: Signa Kell M.D.   On: 12/07/2014 14:06     Assessment/Plan   1. Acute diverticulitis. There is localized perforation. He'll be treated conservatively with intravenous  Zosyn. Surgery and gastroenterology has orally seen the patient and I appreciate their input. Nothing by mouth. If he does not improve, he may require surgery. 2. Hypertension. Continue to monitor and use intravenous hydralazine as needed for hypertension. 3. Coronary artery disease, with recent non-ST elevation MI. Appears to be stable.  Further recommendations will depend on patient's hospital progress.   Code Status: Full code.   DVT Prophylaxis: SCDs.  Family Communication: I discussed the plan with the patient at the bedside.   Disposition Plan: Home when medically stable.  Time spent: 60 minutes.  Wilson Singer [facility] Pager [phone].

## 2014-12-07 NOTE — Progress Notes (Signed)
ANTIBIOTIC CONSULT NOTE  Pharmacy Consult for Zosyn Indication: intra-abdominal infection  No Known Allergies  Patient Measurements: Height: 5\' 6"  (167.6 cm) Weight: 186 lb 14.4 oz (84.777 kg) IBW/kg (Calculated) : 63.8  Vital Signs: Temp: 98.4 F (36.9 C) (06/24 1851) Temp Source: Oral (06/24 1851) BP: 141/71 mmHg (06/24 1851) Pulse Rate: 67 (06/24 1851) Intake/Output from previous day:   Intake/Output from this shift:    Labs:  Recent Labs  12/05/14 1300 12/07/14 1404  WBC 14.3* 12.3*  HGB 14.0 13.8  PLT 394 442*  CREATININE 0.78 0.84   Estimated Creatinine Clearance: 102.7 mL/min (by C-G formula based on Cr of 0.84). No results for input(s): VANCOTROUGH, VANCOPEAK, VANCORANDOM, GENTTROUGH, GENTPEAK, GENTRANDOM, TOBRATROUGH, TOBRAPEAK, TOBRARND, AMIKACINPEAK, AMIKACINTROU, AMIKACIN in the last 72 hours.   Microbiology: Recent Results (from the past 720 hour(s))  MRSA PCR Screening     Status: None   Collection Time: 11/26/14 12:03 PM  Result Value Ref Range Status   MRSA by PCR NEGATIVE NEGATIVE Final    Comment:        The GeneXpert MRSA Assay (FDA approved for NASAL specimens only), is one component of a comprehensive MRSA colonization surveillance program. It is not intended to diagnose MRSA infection nor to guide or monitor treatment for MRSA infections.     Anti-infectives    Start     Dose/Rate Route Frequency Ordered Stop   12/08/14 0100  piperacillin-tazobactam (ZOSYN) IVPB 3.375 g     3.375 g 12.5 mL/hr over 240 Minutes Intravenous Every 8 hours 12/07/14 1855     12/07/14 1715  piperacillin-tazobactam (ZOSYN) IVPB 3.375 g     3.375 g 12.5 mL/hr over 240 Minutes Intravenous  Once 12/07/14 1701     12/07/14 1700  ciprofloxacin (CIPRO) IVPB 400 mg  Status:  Discontinued     400 mg 200 mL/hr over 60 Minutes Intravenous  Once 12/07/14 1650 12/07/14 1654   12/07/14 1700  metroNIDAZOLE (FLAGYL) tablet 500 mg  Status:  Discontinued     500 mg  Oral  Once 12/07/14 1650 12/07/14 1654   12/07/14 1700  metroNIDAZOLE (FLAGYL) IVPB 500 mg  Status:  Discontinued     500 mg 100 mL/hr over 60 Minutes Intravenous Every 8 hours 12/07/14 1654 12/07/14 1712   12/07/14 1700  ciprofloxacin (CIPRO) IVPB 400 mg  Status:  Discontinued     400 mg 200 mL/hr over 60 Minutes Intravenous Every 12 hours 12/07/14 1654 12/07/14 1712      Assessment: 54 yo M who presents with abdominal pain--> CT abdomen + perforated diverticular disease for which he is starting empiric Zosyn.   Renal function is at patient's baseline.  Estimated CrCl > 35ml/min.   Goal of Therapy:  Eradicate infection.  Plan:  Zosyn 3.375gm IV Q8h to be infused over 4hrs Monitor renal function and cx data   Elson Clan 12/07/2014,6:56 PM

## 2014-12-08 LAB — CBC
HCT: 38.1 % — ABNORMAL LOW (ref 39.0–52.0)
Hemoglobin: 12.6 g/dL — ABNORMAL LOW (ref 13.0–17.0)
MCH: 31 pg (ref 26.0–34.0)
MCHC: 33.1 g/dL (ref 30.0–36.0)
MCV: 93.8 fL (ref 78.0–100.0)
Platelets: 428 10*3/uL — ABNORMAL HIGH (ref 150–400)
RBC: 4.06 MIL/uL — AB (ref 4.22–5.81)
RDW: 13.3 % (ref 11.5–15.5)
WBC: 9.9 10*3/uL (ref 4.0–10.5)

## 2014-12-08 LAB — COMPREHENSIVE METABOLIC PANEL
ALT: 44 U/L (ref 17–63)
AST: 25 U/L (ref 15–41)
Albumin: 3.3 g/dL — ABNORMAL LOW (ref 3.5–5.0)
Alkaline Phosphatase: 68 U/L (ref 38–126)
Anion gap: 7 (ref 5–15)
BUN: 12 mg/dL (ref 6–20)
CHLORIDE: 100 mmol/L — AB (ref 101–111)
CO2: 28 mmol/L (ref 22–32)
Calcium: 8.5 mg/dL — ABNORMAL LOW (ref 8.9–10.3)
Creatinine, Ser: 0.84 mg/dL (ref 0.61–1.24)
GFR calc non Af Amer: >60 mL/min (ref >60)
Glucose, Bld: 104 mg/dL — ABNORMAL HIGH (ref 65–99)
Potassium: 4.2 mmol/L (ref 3.5–5.1)
SODIUM: 135 mmol/L (ref 135–145)
Total Bilirubin: 0.5 mg/dL (ref 0.3–1.2)
Total Protein: 7 g/dL (ref 6.5–8.1)

## 2014-12-08 MED ORDER — HYDROMORPHONE HCL 1 MG/ML IJ SOLN
1.0000 mg | INTRAMUSCULAR | Status: DC | PRN
  Administered 2014-12-08 – 2014-12-09 (×7): 1 mg via INTRAVENOUS
  Filled 2014-12-08 (×7): qty 1

## 2014-12-08 NOTE — Progress Notes (Signed)
Patient continues to feel better. He denies any abdominal pain. He is being allowed to have ice chips. On IV Zosyn.  Vital signs in last 24 hours: Temp:  [98.1 F (36.7 C)-98.6 F (37 C)] 98.1 F (36.7 C) (06/25 0712) Pulse Rate:  [59-75] 59 (06/25 0712) Resp:  [16-19] 18 (06/25 0712) BP: (104-156)/(58-90) 104/58 mmHg (06/25 0712) SpO2:  [92 %-100 %] 97 % (06/25 0712) Weight:  [186 lb 14.4 oz (84.777 kg)] 186 lb 14.4 oz (84.777 kg) (06/24 1851) Last BM Date: 12/07/14 General:   Alert,  Well-developed, well-nourished, pleasant and cooperative in NAD Abdomen:  Nondistended. Positive bowel sounds. Abdomen is essentially soft and nontender to palpation without mass or organomegaly.  Extremities:  Without clubbing or edema.    Intake/Output from previous day: 06/24 0701 - 06/25 0700 In: 2738.8 [I.V.:2688.8; IV Piggyback:50] Out: 200 [Urine:200] Intake/Output this shift: Total I/O In: -  Out: 450 [Urine:450]  Lab Results:  Recent Labs  12/05/14 1300 12/07/14 1404 12/08/14 0621  WBC 14.3* 12.3* 9.9  HGB 14.0 13.8 12.6*  HCT 41.4 41.4 38.1*  PLT 394 442* 428*   BMET  Recent Labs  12/05/14 1300 12/07/14 1404 12/08/14 0621  NA 138 138 135  K 4.3 4.6 4.2  CL 100* 98* 100*  CO2 27 30 28   GLUCOSE 111* 119* 104*  BUN 13 16 12   CREATININE 0.78 0.84 0.84  CALCIUM 9.5 9.2 8.5*   LFT  Recent Labs  12/08/14 0621  PROT 7.0  ALBUMIN 3.3*  AST 25  ALT 44  ALKPHOS 68  BILITOT 0.5   PT/INR  Recent Labs  12/07/14 1405  LABPROT 14.6  INR 1.12   Hepatitis Panel No results for input(s): HEPBSAG, HCVAB, HEPAIGM, HEPBIGM in the last 72 hours. C-Diff No results for input(s): CDIFFTOX in the last 72 hours.  Studies/Results: Ct Abdomen Pelvis W Contrast  12/07/2014   CLINICAL DATA:  Abdominal pain and vomiting for 1 week.  EXAM: CT ABDOMEN AND PELVIS WITH CONTRAST  TECHNIQUE: Multidetector CT imaging of the abdomen and pelvis was performed using the standard protocol  following bolus administration of intravenous contrast.  CONTRAST:  OMNIPAQUE IOHEXOL 300 MG/ML SOLN, 42mL OMNIPAQUE IOHEXOL 300 MG/ML SOLN  COMPARISON:  CT abdomen and pelvis 11/21/2003  FINDINGS: Lung bases are clear. No focal nodule, mass, or airspace disease is evident. The heart size is normal. Coronary artery calcifications are present. No significant pleural or pericardial effusion is evident.  The liver and spleen are within normal limits. A small hiatal hernia is present. The stomach is otherwise unremarkable. The duodenum and pancreas are within normal limits. The common bile duct and gallbladder are normal. Adrenal glands are normal bilaterally. Kidneys and ureters are within normal limits. Urinary bladder is unremarkable. Atherosclerotic calcifications are present in the abdominal aorta and branch vessels without aneurysm.  Inflammatory changes are present about diverticula in the sigmoid colon. There are several small locules of gas outside the lumen of the colon suggesting perforation. No abscess formation is evident. The more proximal colon is within normal limits. There is no obstruction. Diverticular changes are present in the ascending proximal transverse colon is well. The appendix is visualized and normal. Small bowel is unremarkable.  Bone windows demonstrate facet degenerative changes most prominent at L5-S1. No focal lytic or blastic lesions are evident.  IMPRESSION: 1. Diverticulitis with extraluminal gas suggesting perforation but no abscess. 2. Atherosclerosis of the abdominal aorta and branch vessels without aneurysm. 3. Coronary artery disease.  Electronically Signed   By: Marin Roberts M.D.   On: 12/07/2014 16:22   Dg Chest Portable 1 View  12/07/2014   CLINICAL DATA:  Abdominal pain. Coronary artery disease and hypertension.  EXAM: PORTABLE CHEST - 1 VIEW  COMPARISON:  None.  FINDINGS: The heart size and mediastinal contours are within normal limits. Both lungs are  clear. The visualized skeletal structures are unremarkable.  IMPRESSION: No active disease.   Electronically Signed   By: Signa Kell M.D.   On: 12/07/2014 14:06    Assessment: Active Problems:   NSTEMI (non-ST elevated myocardial infarction)   Essential hypertension   CAD (coronary artery disease), native coronary artery   Diverticulitis  LOS: 1 day   Impression: Acute diverticulitis with microperforation-much improved overnight on Zosyn. Tolerating ice chips. Appreciate general surgery input. Mild anemia. Hemoccult positive stool but no overt GI bleeding. Antiplatelets therapy recently stopped.  Recommendations: May be able to advance diet further tomorrow. Anticipate antibiotic transition to oral Cipro and Flagyl in the near future.  No need for further workup of Hemoccult-positive stool at this time. Anticipate outpatient follow-up with [place] or his GI provider in [place].  Agree with PPI empirically.     Eula Listen  12/08/2014, 12:01 PM

## 2014-12-08 NOTE — Progress Notes (Signed)
Subjective: Patient is feeling much better today.  Still with very mild LLQ tenderness to palpation  Objective: Vital signs in last 24 hours: Temp:  [98.1 F (36.7 C)-98.7 F (37.1 C)] 98.1 F (36.7 C) (06/25 0712) Pulse Rate:  [59-75] 59 (06/25 0712) Resp:  [16-19] 18 (06/25 0712) BP: (104-156)/(58-90) 104/58 mmHg (06/25 0712) SpO2:  [92 %-100 %] 97 % (06/25 0712) Weight:  [84.777 kg (186 lb 14.4 oz)-86.637 kg (191 lb)] 84.777 kg (186 lb 14.4 oz) (06/24 1851) Last BM Date: 12/07/14  Intake/Output from previous day: 06/24 0701 - 06/25 0700 In: 2738.8 [I.V.:2688.8; IV Piggyback:50] Out: 200 [Urine:200] Intake/Output this shift: Total I/O In: -  Out: 450 [Urine:450]  General appearance: alert, cooperative and no distress Resp: clear to auscultation bilaterally Cardio: regular rate and rhythm, S1, S2 normal, no murmur, click, rub or gallop GI: soft; minimal left lower quadrant tenderness; no rebound; no guarding  Lab Results:   Recent Labs  12/07/14 1404 12/08/14 0621  WBC 12.3* 9.9  HGB 13.8 12.6*  HCT 41.4 38.1*  PLT 442* 428*   BMET  Recent Labs  12/07/14 1404 12/08/14 0621  NA 138 135  K 4.6 4.2  CL 98* 100*  CO2 30 28  GLUCOSE 119* 104*  BUN 16 12  CREATININE 0.84 0.84  CALCIUM 9.2 8.5*   PT/INR  Recent Labs  12/07/14 1405  LABPROT 14.6  INR 1.12   ABG No results for input(s): PHART, HCO3 in the last 72 hours.  Invalid input(s): PCO2, PO2  Studies/Results: Ct Abdomen Pelvis W Contrast  12/07/2014   CLINICAL DATA:  Abdominal pain and vomiting for 1 week.  EXAM: CT ABDOMEN AND PELVIS WITH CONTRAST  TECHNIQUE: Multidetector CT imaging of the abdomen and pelvis was performed using the standard protocol following bolus administration of intravenous contrast.  CONTRAST:  OMNIPAQUE IOHEXOL 300 MG/ML SOLN, 25mL OMNIPAQUE IOHEXOL 300 MG/ML SOLN  COMPARISON:  CT abdomen and pelvis 11/21/2003  FINDINGS: Lung bases are clear. No focal nodule,  mass, or airspace disease is evident. The heart size is normal. Coronary artery calcifications are present. No significant pleural or pericardial effusion is evident.  The liver and spleen are within normal limits. A small hiatal hernia is present. The stomach is otherwise unremarkable. The duodenum and pancreas are within normal limits. The common bile duct and gallbladder are normal. Adrenal glands are normal bilaterally. Kidneys and ureters are within normal limits. Urinary bladder is unremarkable. Atherosclerotic calcifications are present in the abdominal aorta and branch vessels without aneurysm.  Inflammatory changes are present about diverticula in the sigmoid colon. There are several small locules of gas outside the lumen of the colon suggesting perforation. No abscess formation is evident. The more proximal colon is within normal limits. There is no obstruction. Diverticular changes are present in the ascending proximal transverse colon is well. The appendix is visualized and normal. Small bowel is unremarkable.  Bone windows demonstrate facet degenerative changes most prominent at L5-S1. No focal lytic or blastic lesions are evident.  IMPRESSION: 1. Diverticulitis with extraluminal gas suggesting perforation but no abscess. 2. Atherosclerosis of the abdominal aorta and branch vessels without aneurysm. 3. Coronary artery disease.   Electronically Signed   By: Marin Roberts M.D.   On: 12/07/2014 16:22   Dg Chest Portable 1 View  12/07/2014   CLINICAL DATA:  Abdominal pain. Coronary artery disease and hypertension.  EXAM: PORTABLE CHEST - 1 VIEW  COMPARISON:  None.  FINDINGS: The heart size and  mediastinal contours are within normal limits. Both lungs are clear. The visualized skeletal structures are unremarkable.  IMPRESSION: No active disease.   Electronically Signed   By: Signa Kell M.D.   On: 12/07/2014 14:06    Anti-infectives: Anti-infectives    Start     Dose/Rate Route Frequency  Ordered Stop   12/08/14 0100  piperacillin-tazobactam (ZOSYN) IVPB 3.375 g     3.375 g 12.5 mL/hr over 240 Minutes Intravenous Every 8 hours 12/07/14 1855     12/07/14 1715  piperacillin-tazobactam (ZOSYN) IVPB 3.375 g     3.375 g 12.5 mL/hr over 240 Minutes Intravenous  Once 12/07/14 1701 12/07/14 2116   12/07/14 1700  ciprofloxacin (CIPRO) IVPB 400 mg  Status:  Discontinued     400 mg 200 mL/hr over 60 Minutes Intravenous  Once 12/07/14 1650 12/07/14 1654   12/07/14 1700  metroNIDAZOLE (FLAGYL) tablet 500 mg  Status:  Discontinued     500 mg Oral  Once 12/07/14 1650 12/07/14 1654   12/07/14 1700  metroNIDAZOLE (FLAGYL) IVPB 500 mg  Status:  Discontinued     500 mg 100 mL/hr over 60 Minutes Intravenous Every 8 hours 12/07/14 1654 12/07/14 1712   12/07/14 1700  ciprofloxacin (CIPRO) IVPB 400 mg  Status:  Discontinued     400 mg 200 mL/hr over 60 Minutes Intravenous Every 12 hours 12/07/14 1654 12/07/14 1712      Assessment/Plan: s/p * No surgery found * Sigmoid diverticulitis with possible microperforation - improving  Continue current antibiotic regimen - Zosyn Recheck WBC in AM Ice chips/ sips of water today    LOS: 1 day    [address]. 12/08/2014

## 2014-12-08 NOTE — Progress Notes (Signed)
Subjective: Rodney Mahoney was admitted yesterday with abdominal pain. Evaluation in the emergency room revealed diverticulitis with probable perforation with gas outside of the bowel. GI and surgery have been consulted. He is nothing by mouth. He is being treated with Zosyn. He denies pain now. He has had recent melena. He had a positive Hemoccult. He has been treated with pantoprazole. He has been on aspirin and Plavix after an MI 12 days ago.  Objective: Vital signs in last 24 hours: Filed Vitals:   12/07/14 1815 12/07/14 1851 12/07/14 2223 12/08/14 0712  BP:  141/71 141/85 104/58  Pulse: 70 67 68 59  Temp:  98.4 F (36.9 C) 98.4 F (36.9 C) 98.1 F (36.7 C)  TempSrc:  Oral Oral Oral  Resp: 17 18 18 18   Height:  5\' 6"  (1.676 m)    Weight:  186 lb 14.4 oz (84.777 kg)    SpO2: 99% 97% 94% 97%   Weight change:   Intake/Output Summary (Last 24 hours) at 12/08/14 0758 Last data filed at 12/08/14 0713  Gross per 24 hour  Intake 2738.75 ml  Output    650 ml  Net 2088.75 ml    Physical Exam: Alert. No distress. Lungs clear. Heart regular with no murmurs. Abdomen is soft and minimally tender in the left lower quadrant.  Lab Results:    Results for orders placed or performed during the hospital encounter of 12/07/14 (from the past 24 hour(s))  Urinalysis, Routine w reflex microscopic (not at [facility])     Status: Abnormal   Collection Time: 12/07/14  1:39 PM  Result Value Ref Range   Color, Urine YELLOW YELLOW   APPearance CLEAR CLEAR   Specific Gravity, Urine 1.020 1.005 - 1.030   pH 7.5 5.0 - 8.0   Glucose, UA NEGATIVE NEGATIVE mg/dL   Hgb urine dipstick NEGATIVE NEGATIVE   Bilirubin Urine NEGATIVE NEGATIVE   Ketones, ur NEGATIVE NEGATIVE mg/dL   Protein, ur 30 (A) NEGATIVE mg/dL   Urobilinogen, UA 1.0 0.0 - 1.0 mg/dL   Nitrite NEGATIVE NEGATIVE   Leukocytes, UA NEGATIVE NEGATIVE  Urine microscopic-add on     Status: None   Collection Time: 12/07/14  1:39 PM  Result Value Ref  Range   RBC / HPF 0-2 <3 RBC/hpf   Urine-Other MUCOUS PRESENT   POC occult blood, ED Provider will collect     Status: Abnormal   Collection Time: 12/07/14  1:59 PM  Result Value Ref Range   Fecal Occult Bld POSITIVE (A) NEGATIVE  CBC WITH DIFFERENTIAL     Status: Abnormal   Collection Time: 12/07/14  2:04 PM  Result Value Ref Range   WBC 12.3 (H) 4.0 - 10.5 K/uL   RBC 4.42 4.22 - 5.81 MIL/uL   Hemoglobin 13.8 13.0 - 17.0 g/dL   HCT 16.1 09.6 - 04.5 %   MCV 93.7 78.0 - 100.0 fL   MCH 31.2 26.0 - 34.0 pg   MCHC 33.3 30.0 - 36.0 g/dL   RDW 40.9 81.1 - 91.4 %   Platelets 442 (H) 150 - 400 K/uL   Neutrophils Relative % 73 43 - 77 %   Neutro Abs 8.9 (H) 1.7 - 7.7 K/uL   Lymphocytes Relative 22 12 - 46 %   Lymphs Abs 2.7 0.7 - 4.0 K/uL   Monocytes Relative 5 3 - 12 %   Monocytes Absolute 0.6 0.1 - 1.0 K/uL   Eosinophils Relative 0 0 - 5 %   Eosinophils Absolute 0.0 0.0 -  0.7 K/uL   Basophils Relative 0 0 - 1 %   Basophils Absolute 0.0 0.0 - 0.1 K/uL  Comprehensive metabolic panel     Status: Abnormal   Collection Time: 12/07/14  2:04 PM  Result Value Ref Range   Sodium 138 135 - 145 mmol/L   Potassium 4.6 3.5 - 5.1 mmol/L   Chloride 98 (L) 101 - 111 mmol/L   CO2 30 22 - 32 mmol/L   Glucose, Bld 119 (H) 65 - 99 mg/dL   BUN 16 6 - 20 mg/dL   Creatinine, Ser 1.61 0.61 - 1.24 mg/dL   Calcium 9.2 8.9 - 09.6 mg/dL   Total Protein 8.1 6.5 - 8.1 g/dL   Albumin 3.8 3.5 - 5.0 g/dL   AST 33 15 - 41 U/L   ALT 56 17 - 63 U/L   Alkaline Phosphatase 78 38 - 126 U/L   Total Bilirubin 0.5 0.3 - 1.2 mg/dL   GFR calc non Af Amer >60 >60 mL/min   GFR calc Af Amer >60 >60 mL/min   Anion gap 10 5 - 15  Lipase, blood     Status: Abnormal   Collection Time: 12/07/14  2:04 PM  Result Value Ref Range   Lipase 16 (L) 22 - 51 U/L  APTT     Status: None   Collection Time: 12/07/14  2:05 PM  Result Value Ref Range   aPTT 28 24 - 37 seconds  Protime-INR     Status: None   Collection Time:  12/07/14  2:05 PM  Result Value Ref Range   Prothrombin Time 14.6 11.6 - 15.2 seconds   INR 1.12 0.00 - 1.49  Type and screen     Status: None   Collection Time: 12/07/14  2:05 PM  Result Value Ref Range   ABO/RH(D) O POS    Antibody Screen NEG    Sample Expiration 12/10/2014   Comprehensive metabolic panel     Status: Abnormal   Collection Time: 12/08/14  6:21 AM  Result Value Ref Range   Sodium 135 135 - 145 mmol/L   Potassium 4.2 3.5 - 5.1 mmol/L   Chloride 100 (L) 101 - 111 mmol/L   CO2 28 22 - 32 mmol/L   Glucose, Bld 104 (H) 65 - 99 mg/dL   BUN 12 6 - 20 mg/dL   Creatinine, Ser 0.45 0.61 - 1.24 mg/dL   Calcium 8.5 (L) 8.9 - 10.3 mg/dL   Total Protein 7.0 6.5 - 8.1 g/dL   Albumin 3.3 (L) 3.5 - 5.0 g/dL   AST 25 15 - 41 U/L   ALT 44 17 - 63 U/L   Alkaline Phosphatase 68 38 - 126 U/L   Total Bilirubin 0.5 0.3 - 1.2 mg/dL   GFR calc non Af Amer >60 >60 mL/min   GFR calc Af Amer >60 >60 mL/min   Anion gap 7 5 - 15  CBC     Status: Abnormal   Collection Time: 12/08/14  6:21 AM  Result Value Ref Range   WBC 9.9 4.0 - 10.5 K/uL   RBC 4.06 (L) 4.22 - 5.81 MIL/uL   Hemoglobin 12.6 (L) 13.0 - 17.0 g/dL   HCT 40.9 (L) 81.1 - 91.4 %   MCV 93.8 78.0 - 100.0 fL   MCH 31.0 26.0 - 34.0 pg   MCHC 33.1 30.0 - 36.0 g/dL   RDW 78.2 95.6 - 21.3 %   Platelets 428 (H) 150 - 400 K/uL  ABGS No results for input(s): PHART, PO2ART, TCO2, HCO3 in the last 72 hours.  Invalid input(s): PCO2 CULTURES No results found for this or any previous visit (from the past 240 hour(s)). Studies/Results: Ct Abdomen Pelvis W Contrast  12/07/2014   CLINICAL DATA:  Abdominal pain and vomiting for 1 week.  EXAM: CT ABDOMEN AND PELVIS WITH CONTRAST  TECHNIQUE: Multidetector CT imaging of the abdomen and pelvis was performed using the standard protocol following bolus administration of intravenous contrast.  CONTRAST:  OMNIPAQUE IOHEXOL 300 MG/ML SOLN, 74mL OMNIPAQUE IOHEXOL 300 MG/ML SOLN   COMPARISON:  CT abdomen and pelvis 11/21/2003  FINDINGS: Lung bases are clear. No focal nodule, mass, or airspace disease is evident. The heart size is normal. Coronary artery calcifications are present. No significant pleural or pericardial effusion is evident.  The liver and spleen are within normal limits. A small hiatal hernia is present. The stomach is otherwise unremarkable. The duodenum and pancreas are within normal limits. The common bile duct and gallbladder are normal. Adrenal glands are normal bilaterally. Kidneys and ureters are within normal limits. Urinary bladder is unremarkable. Atherosclerotic calcifications are present in the abdominal aorta and branch vessels without aneurysm.  Inflammatory changes are present about diverticula in the sigmoid colon. There are several small locules of gas outside the lumen of the colon suggesting perforation. No abscess formation is evident. The more proximal colon is within normal limits. There is no obstruction. Diverticular changes are present in the ascending proximal transverse colon is well. The appendix is visualized and normal. Small bowel is unremarkable.  Bone windows demonstrate facet degenerative changes most prominent at L5-S1. No focal lytic or blastic lesions are evident.  IMPRESSION: 1. Diverticulitis with extraluminal gas suggesting perforation but no abscess. 2. Atherosclerosis of the abdominal aorta and branch vessels without aneurysm. 3. Coronary artery disease.   Electronically Signed   By: Marin Roberts M.D.   On: 12/07/2014 16:22   Dg Chest Portable 1 View  12/07/2014   CLINICAL DATA:  Abdominal pain. Coronary artery disease and hypertension.  EXAM: PORTABLE CHEST - 1 VIEW  COMPARISON:  None.  FINDINGS: The heart size and mediastinal contours are within normal limits. Both lungs are clear. The visualized skeletal structures are unremarkable.  IMPRESSION: No active disease.   Electronically Signed   By: Signa Kell M.D.   On:  12/07/2014 14:06   Micro Results: No results found for this or any previous visit (from the past 240 hour(s)). Studies/Results: Ct Abdomen Pelvis W Contrast  12/07/2014   CLINICAL DATA:  Abdominal pain and vomiting for 1 week.  EXAM: CT ABDOMEN AND PELVIS WITH CONTRAST  TECHNIQUE: Multidetector CT imaging of the abdomen and pelvis was performed using the standard protocol following bolus administration of intravenous contrast.  CONTRAST:  OMNIPAQUE IOHEXOL 300 MG/ML SOLN, 63mL OMNIPAQUE IOHEXOL 300 MG/ML SOLN  COMPARISON:  CT abdomen and pelvis 11/21/2003  FINDINGS: Lung bases are clear. No focal nodule, mass, or airspace disease is evident. The heart size is normal. Coronary artery calcifications are present. No significant pleural or pericardial effusion is evident.  The liver and spleen are within normal limits. A small hiatal hernia is present. The stomach is otherwise unremarkable. The duodenum and pancreas are within normal limits. The common bile duct and gallbladder are normal. Adrenal glands are normal bilaterally. Kidneys and ureters are within normal limits. Urinary bladder is unremarkable. Atherosclerotic calcifications are present in the abdominal aorta and branch vessels without aneurysm.  Inflammatory changes  are present about diverticula in the sigmoid colon. There are several small locules of gas outside the lumen of the colon suggesting perforation. No abscess formation is evident. The more proximal colon is within normal limits. There is no obstruction. Diverticular changes are present in the ascending proximal transverse colon is well. The appendix is visualized and normal. Small bowel is unremarkable.  Bone windows demonstrate facet degenerative changes most prominent at L5-S1. No focal lytic or blastic lesions are evident.  IMPRESSION: 1. Diverticulitis with extraluminal gas suggesting perforation but no abscess. 2. Atherosclerosis of the abdominal aorta and branch vessels without  aneurysm. 3. Coronary artery disease.   Electronically Signed   By: Marin Roberts M.D.   On: 12/07/2014 16:22   Dg Chest Portable 1 View  12/07/2014   CLINICAL DATA:  Abdominal pain. Coronary artery disease and hypertension.  EXAM: PORTABLE CHEST - 1 VIEW  COMPARISON:  None.  FINDINGS: The heart size and mediastinal contours are within normal limits. Both lungs are clear. The visualized skeletal structures are unremarkable.  IMPRESSION: No active disease.   Electronically Signed   By: Signa Kell M.D.   On: 12/07/2014 14:06   Medications:  I have reviewed the patient's current medications Scheduled Meds: . piperacillin-tazobactam (ZOSYN)  IV  3.375 g Intravenous Q8H   Continuous Infusions: . sodium chloride 1,000 mL (12/08/14 0437)   PRN Meds:.hydrALAZINE, HYDROmorphone (DILAUDID) injection, ondansetron **OR** ondansetron (ZOFRAN) IV   Assessment/Plan: #1. Diverticulitis with perforation. Continue Zosyn. Nothing by mouth per GI/surgery. White count has improved from 12.3-9.9. #2. Recent inferior MI. He denies chest pain. #3. Melena. Antiplatelets therapy has been held. Protonix has been started. Hemoglobin is down from 13.8-12.6. Active Problems:   NSTEMI (non-ST elevated myocardial infarction)   Essential hypertension   CAD (coronary artery disease), native coronary artery   Diverticulitis     LOS: 1 day   Kc Summerson 12/08/2014, 7:58 AM

## 2014-12-09 MED ORDER — HYDROCODONE-ACETAMINOPHEN 5-325 MG PO TABS: 1.0000 | ORAL_TABLET | ORAL | Status: DC | PRN

## 2014-12-09 MED ORDER — AMOXICILLIN-POT CLAVULANATE 875-125 MG PO TABS: 1.0000 | ORAL_TABLET | Freq: Two times a day (BID) | ORAL | Status: DC

## 2014-12-09 NOTE — Discharge Summary (Signed)
Physician Discharge Summary  Patient ID: Rodney Mahoney MRN: [id number] DOB/AGE: 1961-01-04 54 y.o.  Admit date: 12/07/2014 Discharge date: 12/09/2014  Admission Diagnoses:  Discharge Diagnoses:  Active Problems:   NSTEMI (non-ST elevated myocardial infarction)   Essential hypertension   CAD (coronary artery disease), native coronary artery   Diverticulitis   Discharged Condition: good  Hospital Course: the pt was admitted with diverticulitis. He improved dramatically on IV abx. He was discharged on hd 2 because his daughter was getting married. He agreed to follow all of our recommendations.  Consults: None  Significant Diagnostic Studies: none  Treatments: antibiotics: Zosyn  Discharge Exam: Blood pressure 117/65, pulse 53, temperature 98.4 F (36.9 C), temperature source Oral, resp. rate 18, height 5\' 6"  (1.676 m), weight 84.777 kg (186 lb 14.4 oz), SpO2 96 %. Resp: clear to auscultation bilaterally Cardio: regular rate and rhythm GI: soft, minimal tenderness. good bs  Disposition: 01-Home or Self Care  Discharge Instructions    Call MD for:  difficulty breathing, headache or visual disturbances    Complete by:  As directed      Call MD for:  extreme fatigue    Complete by:  As directed      Call MD for:  hives    Complete by:  As directed      Call MD for:  persistant dizziness or light-headedness    Complete by:  As directed      Call MD for:  persistant nausea and vomiting    Complete by:  As directed      Call MD for:  redness, tenderness, or signs of infection (pain, swelling, redness, odor or green/yellow discharge around incision site)    Complete by:  As directed      Call MD for:  severe uncontrolled pain    Complete by:  As directed      Call MD for:  temperature >100.4    Complete by:  As directed      Diet - low sodium heart healthy    Complete by:  As directed      Discharge instructions    Complete by:  As directed   Liquids today then soft  foods tomorrow. Low residue diet after that. Take antibiotics. Avoid all seeds and nuts     Increase activity slowly    Complete by:  As directed      No wound care    Complete by:  As directed             Medication List    TAKE these medications        ALKA SELTZER PLUS PO  Take 2 tablets by mouth daily as needed (pain).     amoxicillin-clavulanate 875-125 MG per tablet  Commonly known as:  AUGMENTIN  Take 1 tablet by mouth 2 (two) times daily.     aspirin 81 MG chewable tablet  Chew 1 tablet (81 mg total) by mouth daily.     atorvastatin 80 MG tablet  Commonly known as:  LIPITOR  Take 1 tablet (80 mg total) by mouth daily at 6 PM.     HYDROcodone-acetaminophen 5-325 MG per tablet  Commonly known as:  NORCO/VICODIN  Take 1-2 tablets by mouth every 4 (four) hours as needed for moderate pain or severe pain.     lisinopril 2.5 MG tablet  Commonly known as:  PRINIVIL,ZESTRIL  Take 1 tablet (2.5 mg total) by mouth daily.     metoprolol tartrate 25 MG  tablet  Commonly known as:  LOPRESSOR  Take 0.5 tablets (12.5 mg total) by mouth 2 (two) times daily.     nitroGLYCERIN 0.4 MG SL tablet  Commonly known as:  NITROSTAT  Place 1 tablet (0.4 mg total) under the tongue every 5 (five) minutes as needed for chest pain (up to 3 doses).     pantoprazole 40 MG tablet  Commonly known as:  PROTONIX  Take 1 tablet (40 mg total) by mouth daily.           Follow-up Information    Follow up with Robyne Askew, MD.   Specialty:  General Surgery   Contact information:   [address] [place] [address] [phone]       Follow up with Dalia Heading, MD In 1 week.   Specialty:  General Surgery   Contact information:   [address] [place] [address] [phone]       Signed: Robyne Askew 12/09/2014, 8:26 AM

## 2014-12-09 NOTE — Progress Notes (Signed)
Subjective: Rodney Mahoney is feeling better. He is being discharged by surgery. His daughter is getting married today. He denies having any pain now. He states he took his last pain medication primarily to help him sleep.  Objective: Vital signs in last 24 hours: Filed Vitals:   12/08/14 1352 12/08/14 1357 12/08/14 2202 12/09/14 0534  BP: 103/38 103/50 133/73 117/65  Pulse: 62  66 53  Temp: 98.1 F (36.7 C)  98.4 F (36.9 C) 98.4 F (36.9 C)  TempSrc: Oral  Oral Oral  Resp: Height:      Weight:      SpO2: 95%  95% 96%   Weight change:   Intake/Output Summary (Last 24 hours) at 12/09/14 0856 Last data filed at 12/09/14 0600  Gross per 24 hour  Intake   3150 ml  Output   1800 ml  Net   1350 ml    Physical Exam: Alert. No distress. Lungs clear. Heart regular with no murmurs. Abdomen soft and nontender with no palpable organomegaly.  Lab Results:   No results found for this or any previous visit (from the past 24 hour(s)).   ABGS No results for input(s): PHART, PO2ART, TCO2, HCO3 in the last 72 hours.  Invalid input(s): PCO2 CULTURES No results found for this or any previous visit (from the past 240 hour(s)). Studies/Results: Ct Abdomen Pelvis W Contrast  12/07/2014   CLINICAL DATA:  Abdominal pain and vomiting for 1 week.  EXAM: CT ABDOMEN AND PELVIS WITH CONTRAST  TECHNIQUE: Multidetector CT imaging of the abdomen and pelvis was performed using the standard protocol following bolus administration of intravenous contrast.  CONTRAST:  OMNIPAQUE IOHEXOL 300 MG/ML SOLN, 25mL OMNIPAQUE IOHEXOL 300 MG/ML SOLN  COMPARISON:  CT abdomen and pelvis 11/21/2003  FINDINGS: Lung bases are clear. No focal nodule, mass, or airspace disease is evident. The heart size is normal. Coronary artery calcifications are present. No significant pleural or pericardial effusion is evident.  The liver and spleen are within normal limits. A small hiatal hernia is present. The stomach is  otherwise unremarkable. The duodenum and pancreas are within normal limits. The common bile duct and gallbladder are normal. Adrenal glands are normal bilaterally. Kidneys and ureters are within normal limits. Urinary bladder is unremarkable. Atherosclerotic calcifications are present in the abdominal aorta and branch vessels without aneurysm.  Inflammatory changes are present about diverticula in the sigmoid colon. There are several small locules of gas outside the lumen of the colon suggesting perforation. No abscess formation is evident. The more proximal colon is within normal limits. There is no obstruction. Diverticular changes are present in the ascending proximal transverse colon is well. The appendix is visualized and normal. Small bowel is unremarkable.  Bone windows demonstrate facet degenerative changes most prominent at L5-S1. No focal lytic or blastic lesions are evident.  IMPRESSION: 1. Diverticulitis with extraluminal gas suggesting perforation but no abscess. 2. Atherosclerosis of the abdominal aorta and branch vessels without aneurysm. 3. Coronary artery disease.   Electronically Signed   By: Marin Roberts M.D.   On: 12/07/2014 16:22   Dg Chest Portable 1 View  12/07/2014   CLINICAL DATA:  Abdominal pain. Coronary artery disease and hypertension.  EXAM: PORTABLE CHEST - 1 VIEW  COMPARISON:  None.  FINDINGS: The heart size and mediastinal contours are within normal limits. Both lungs are clear. The visualized skeletal structures are unremarkable.  IMPRESSION: No active disease.   Electronically Signed   By: Ladona Ridgel  Bradly Chris M.D.   On: 12/07/2014 14:06   Micro Results: No results found for this or any previous visit (from the past 240 hour(s)). Studies/Results: Ct Abdomen Pelvis W Contrast  12/07/2014   CLINICAL DATA:  Abdominal pain and vomiting for 1 week.  EXAM: CT ABDOMEN AND PELVIS WITH CONTRAST  TECHNIQUE: Multidetector CT imaging of the abdomen and pelvis was performed using the  standard protocol following bolus administration of intravenous contrast.  CONTRAST:  OMNIPAQUE IOHEXOL 300 MG/ML SOLN, 66mL OMNIPAQUE IOHEXOL 300 MG/ML SOLN  COMPARISON:  CT abdomen and pelvis 11/21/2003  FINDINGS: Lung bases are clear. No focal nodule, mass, or airspace disease is evident. The heart size is normal. Coronary artery calcifications are present. No significant pleural or pericardial effusion is evident.  The liver and spleen are within normal limits. A small hiatal hernia is present. The stomach is otherwise unremarkable. The duodenum and pancreas are within normal limits. The common bile duct and gallbladder are normal. Adrenal glands are normal bilaterally. Kidneys and ureters are within normal limits. Urinary bladder is unremarkable. Atherosclerotic calcifications are present in the abdominal aorta and branch vessels without aneurysm.  Inflammatory changes are present about diverticula in the sigmoid colon. There are several small locules of gas outside the lumen of the colon suggesting perforation. No abscess formation is evident. The more proximal colon is within normal limits. There is no obstruction. Diverticular changes are present in the ascending proximal transverse colon is well. The appendix is visualized and normal. Small bowel is unremarkable.  Bone windows demonstrate facet degenerative changes most prominent at L5-S1. No focal lytic or blastic lesions are evident.  IMPRESSION: 1. Diverticulitis with extraluminal gas suggesting perforation but no abscess. 2. Atherosclerosis of the abdominal aorta and branch vessels without aneurysm. 3. Coronary artery disease.   Electronically Signed   By: Marin Roberts M.D.   On: 12/07/2014 16:22   Dg Chest Portable 1 View  12/07/2014   CLINICAL DATA:  Abdominal pain. Coronary artery disease and hypertension.  EXAM: PORTABLE CHEST - 1 VIEW  COMPARISON:  None.  FINDINGS: The heart size and mediastinal contours are within normal limits.  Both lungs are clear. The visualized skeletal structures are unremarkable.  IMPRESSION: No active disease.   Electronically Signed   By: Signa Kell M.D.   On: 12/07/2014 14:06   Medications:  I have reviewed the patient's current medications Scheduled Meds: . piperacillin-tazobactam (ZOSYN)  IV  3.375 g Intravenous Q8H   Continuous Infusions: . sodium chloride 1,000 mL (12/09/14 0645)   PRN Meds:.hydrALAZINE, HYDROmorphone (DILAUDID) injection, ondansetron **OR** ondansetron (ZOFRAN) IV   Assessment/Plan: #1. Diverticulitis. Anti-biotic therapy has been modified to Augmentin twice daily. He has been discharged. He will follow-up with surgery and his primary care physician in a week. #2. Melena. He will continue low-dose aspirin and will discontinue Plavix. He will take pantoprazole 40 mg daily. #3. Coronary artery disease. Asymptomatic now 2 weeks out from his MI. He will continue aspirin, atorvastatin, metoprolol and lisinopril. Medications were reviewed with him. Active Problems:   NSTEMI (non-ST elevated myocardial infarction)   Essential hypertension   CAD (coronary artery disease), native coronary artery   Diverticulitis     LOS: 2 days   Inayah Woodin 12/09/2014, 8:56 AM

## 2014-12-09 NOTE — Progress Notes (Signed)
Subjective: Feels good. Wants to go home today because his daughter is getting married and he doesn't want to miss it  Objective: Vital signs in last 24 hours: Temp:  [98.1 F (36.7 C)-98.4 F (36.9 C)] 98.4 F (36.9 C) (06/26 0534) Pulse Rate:  [53-66] 53 (06/26 0534) Resp:  [18] 18 (06/26 0534) BP: (103-133)/(38-73) 117/65 mmHg (06/26 0534) SpO2:  [95 %-96 %] 96 % (06/26 0534) Last BM Date: 12/07/14  Intake/Output from previous day: 06/25 0701 - 06/26 0700 In: 3150 [I.V.:3000; IV Piggyback:150] Out: 2250 [Urine:2250] Intake/Output this shift:    Resp: clear to auscultation bilaterally Cardio: regular rate and rhythm GI: soft, minimal tenderness. good bs  Lab Results:   Recent Labs  12/07/14 1404 12/08/14 0621  WBC 12.3* 9.9  HGB 13.8 12.6*  HCT 41.4 38.1*  PLT 442* 428*   BMET  Recent Labs  12/07/14 1404 12/08/14 0621  NA 138 135  K 4.6 4.2  CL 98* 100*  CO2 30 28  GLUCOSE 119* 104*  BUN 16 12  CREATININE 0.84 0.84  CALCIUM 9.2 8.5*   PT/INR  Recent Labs  12/07/14 1405  LABPROT 14.6  INR 1.12   ABG No results for input(s): PHART, HCO3 in the last 72 hours.  Invalid input(s): PCO2, PO2  Studies/Results: Ct Abdomen Pelvis W Contrast  12/07/2014   CLINICAL DATA:  Abdominal pain and vomiting for 1 week.  EXAM: CT ABDOMEN AND PELVIS WITH CONTRAST  TECHNIQUE: Multidetector CT imaging of the abdomen and pelvis was performed using the standard protocol following bolus administration of intravenous contrast.  CONTRAST:  OMNIPAQUE IOHEXOL 300 MG/ML SOLN, 25mL OMNIPAQUE IOHEXOL 300 MG/ML SOLN  COMPARISON:  CT abdomen and pelvis 11/21/2003  FINDINGS: Lung bases are clear. No focal nodule, mass, or airspace disease is evident. The heart size is normal. Coronary artery calcifications are present. No significant pleural or pericardial effusion is evident.  The liver and spleen are within normal limits. A small hiatal hernia is present. The stomach is  otherwise unremarkable. The duodenum and pancreas are within normal limits. The common bile duct and gallbladder are normal. Adrenal glands are normal bilaterally. Kidneys and ureters are within normal limits. Urinary bladder is unremarkable. Atherosclerotic calcifications are present in the abdominal aorta and branch vessels without aneurysm.  Inflammatory changes are present about diverticula in the sigmoid colon. There are several small locules of gas outside the lumen of the colon suggesting perforation. No abscess formation is evident. The more proximal colon is within normal limits. There is no obstruction. Diverticular changes are present in the ascending proximal transverse colon is well. The appendix is visualized and normal. Small bowel is unremarkable.  Bone windows demonstrate facet degenerative changes most prominent at L5-S1. No focal lytic or blastic lesions are evident.  IMPRESSION: 1. Diverticulitis with extraluminal gas suggesting perforation but no abscess. 2. Atherosclerosis of the abdominal aorta and branch vessels without aneurysm. 3. Coronary artery disease.   Electronically Signed   By: Marin Roberts M.D.   On: 12/07/2014 16:22   Dg Chest Portable 1 View  12/07/2014   CLINICAL DATA:  Abdominal pain. Coronary artery disease and hypertension.  EXAM: PORTABLE CHEST - 1 VIEW  COMPARISON:  None.  FINDINGS: The heart size and mediastinal contours are within normal limits. Both lungs are clear. The visualized skeletal structures are unremarkable.  IMPRESSION: No active disease.   Electronically Signed   By: Signa Kell M.D.   On: 12/07/2014 14:06    Anti-infectives:  Anti-infectives    Start     Dose/Rate Route Frequency Ordered Stop   12/08/14 0100  piperacillin-tazobactam (ZOSYN) IVPB 3.375 g     3.375 g 12.5 mL/hr over 240 Minutes Intravenous Every 8 hours 12/07/14 1855     12/07/14 1715  piperacillin-tazobactam (ZOSYN) IVPB 3.375 g     3.375 g 12.5 mL/hr over 240 Minutes  Intravenous  Once 12/07/14 1701 12/07/14 2116   12/07/14 1700  ciprofloxacin (CIPRO) IVPB 400 mg  Status:  Discontinued     400 mg 200 mL/hr over 60 Minutes Intravenous  Once 12/07/14 1650 12/07/14 1654   12/07/14 1700  metroNIDAZOLE (FLAGYL) tablet 500 mg  Status:  Discontinued     500 mg Oral  Once 12/07/14 1650 12/07/14 1654   12/07/14 1700  metroNIDAZOLE (FLAGYL) IVPB 500 mg  Status:  Discontinued     500 mg 100 mL/hr over 60 Minutes Intravenous Every 8 hours 12/07/14 1654 12/07/14 1712   12/07/14 1700  ciprofloxacin (CIPRO) IVPB 400 mg  Status:  Discontinued     400 mg 200 mL/hr over 60 Minutes Intravenous Every 12 hours 12/07/14 1654 12/07/14 1712      Assessment/Plan: s/p * No surgery found * will switch to oral abx and plan for discharge. he agrees to call and return if his pain worsens  LOS: 2 days    TOTH III,Giles Currie S 12/09/2014

## 2014-12-13 ENCOUNTER — Ambulatory Visit (INDEPENDENT_AMBULATORY_CARE_PROVIDER_SITE_OTHER): Payer: BLUE CROSS/BLUE SHIELD | Admitting: Cardiology

## 2014-12-13 ENCOUNTER — Encounter: Payer: Self-pay | Admitting: Cardiology

## 2014-12-13 VITALS — BP 106/68 | HR 91 | Ht 66.0 in | Wt 190.6 lb

## 2014-12-13 DIAGNOSIS — I222 Subsequent non-ST elevation (NSTEMI) myocardial infarction: Secondary | ICD-10-CM | POA: Diagnosis not present

## 2014-12-13 DIAGNOSIS — I214 Non-ST elevation (NSTEMI) myocardial infarction: Secondary | ICD-10-CM

## 2014-12-13 DIAGNOSIS — I2510 Atherosclerotic heart disease of native coronary artery without angina pectoris: Secondary | ICD-10-CM | POA: Diagnosis not present

## 2014-12-13 NOTE — Progress Notes (Signed)
Cardiology Office Note  Date: 12/13/2014   ID: Rodney Mahoney, DOB 1961-05-20, MRN [id number]  PCP: Isabella Stalling, MD  Primary Cardiologist: Nona Dell, MD   Chief Complaint  Patient presents with  . Coronary Artery Disease    History of Present Illness: Rodney Mahoney is a 54 y.o. male that I am meeting for the first time in the office today. I reviewed extensive records. As outlined below, history includes NSTEMI in mid June with findings of CAD that was managed medically. He most recently saw Ms. Geni Bers PA-C in the office for hospital follow-up at that time was having nausea and emesis as well as reported black stools. Discussion was had with Dr. Purvis Sheffield at that time and the patient was taken off of Plavix and placed on Protonix. I see that he was hospitalized soon thereafter with a diagnosis of diverticulitis, and treated with antibiotics. He was discharged just on June 26.  He comes in today for a follow-up visit. From a cardiac perspective, he reports no chest pain. States that he has been trying to walk for exercise. He is eager to return to work, is a Passenger transport manager at [employer]. He states that he typically does not have lift any more than 10 or 15 pounds. We reviewed his medications, he reports compliance. He has not had a recent lipid panel.  He continues on a course of Augmentin which will be completed soon for his diagnosis of diverticulitis. Follow-up planned with Dr. Janna Arch.   Past Medical History  Diagnosis Date  . Hypertension   . Bronchitis   . CAD (coronary artery disease)     a. cath 11/26/2014 EF 45%, moderate to severe inferior hypokinesis, 90% small D1, 100% mid to distal RCA with minimal collateral, 40% mid to distal LCx. Medical therapy as RCA occlusion appears to be completed event b. echo 11/27/2014 EF 50-55%  . Hyperlipidemia   . MR (mitral regurgitation)     mild by echo 11/27/2014  . NSTEMI (non-ST elevated myocardial  infarction)     11/26/14    Past Surgical History  Procedure Laterality Date  . Neck surgery  2005  . Cardiac catheterization N/A 11/26/2014    Procedure: Left Heart Cath and Coronary Angiography;  Surgeon: Peter M Swaziland, MD;  Location: [facility] INVASIVE CV LAB;  Service: Cardiovascular;  Laterality: N/A;  . Colonoscopy      per patient: done in [place], age 4, couple of polyps.    Current Outpatient Prescriptions  Medication Sig Dispense Refill  . amoxicillin-clavulanate (AUGMENTIN) 875-125 MG per tablet Take 1 tablet by mouth 2 (two) times daily. 14 tablet 1  . aspirin 81 MG chewable tablet Chew 1 tablet (81 mg total) by mouth daily.    Marland Kitchen atorvastatin (LIPITOR) 80 MG tablet Take 1 tablet (80 mg total) by mouth daily at 6 PM. 30 tablet 11  . HYDROcodone-acetaminophen (NORCO/VICODIN) 5-325 MG per tablet Take 1-2 tablets by mouth every 4 (four) hours as needed for moderate pain or severe pain. 30 tablet 0  . lisinopril (PRINIVIL,ZESTRIL) 2.5 MG tablet Take 1 tablet (2.5 mg total) by mouth daily. 30 tablet 11  . metoprolol tartrate (LOPRESSOR) 25 MG tablet Take 0.5 tablets (12.5 mg total) by mouth 2 (two) times daily. 30 tablet 11  . nitroGLYCERIN (NITROSTAT) 0.4 MG SL tablet Place 1 tablet (0.4 mg total) under the tongue every 5 (five) minutes as needed for chest pain (up to 3 doses). 25 tablet 3  . pantoprazole (  PROTONIX) 40 MG tablet Take 1 tablet (40 mg total) by mouth daily. 30 tablet 11   No current facility-administered medications for this visit.    Allergies:  Review of patient's allergies indicates no known allergies.   Social History: The patient  reports that he has been smoking Cigarettes.  He has been smoking about 0.50 packs per day. He does not have any smokeless tobacco history on file. He reports that he drinks alcohol. He reports that he does not use illicit drugs.   ROS:  Please see the history of present illness. Otherwise, complete review of systems is positive for loose  stools, no abdominal pain.  All other systems are reviewed and negative.   Physical Exam: VS:  BP 106/68 mmHg  Pulse 91  Ht 5\' 6"  (1.676 m)  Wt 190 lb 9.6 oz (86.456 kg)  BMI 30.78 kg/m2  SpO2 93%, BMI Body mass index is 30.78 kg/(m^2).  Wt Readings from Last 3 Encounters:  12/13/14 190 lb 9.6 oz (86.456 kg)  12/07/14 186 lb 14.4 oz (84.777 kg)  12/05/14 191 lb (86.637 kg)     General: Patient appears comfortable at rest. HEENT: Conjunctiva and lids normal, oropharynx clear. Neck: Supple, no elevated JVP or carotid bruits, no thyromegaly. Lungs: Clear to auscultation, nonlabored breathing at rest. Cardiac: Regular rate and rhythm, no S3 or significant systolic murmur, no pericardial rub. Abdomen: Soft, nontender, no hepatomegaly, bowel sounds present, no guarding or rebound. Extremities: No pitting edema, distal pulses 2+. Skin: Warm and dry. Musculoskeletal: No kyphosis. Neuropsychiatric: Alert and oriented x3, affect grossly appropriate.   ECG: Recent tracing from 12/05/2014 showed sinus rhythm with LVH and evolutionary changes of recent inferior infarct.  Recent Labwork: 12/08/2014: ALT 44; AST 25; BUN 12; Creatinine, Ser 0.84; Hemoglobin 12.6*; Platelets 428*; Potassium 4.2; Sodium 135   Other Studies Reviewed Today:  Echocardiogram 11/27/2014: Study Conclusions  - Left ventricle: The cavity size was normal. Systolic function was normal. The estimated ejection fraction was in the range of 50% to 55%. Wall motion was normal; there were no regional wall motion abnormalities. Doppler parameters are consistent with abnormal left ventricular relaxation (grade 1 diastolic dysfunction). - Mitral valve: There was mild regurgitation.  ASSESSMENT AND PLAN:  1. History of NSTEMI in mid June with CAD including a 90% stenosis with a small diagonal branch, and also occlusion of the mid to distal RCA which was felt to be the culprit event and completed, revascularization  was not pursued. He is clinically stable at this time without angina, LVEF 50-55% by echocardiogram. Plan will be to continue current medications, Plavix was stopped as noted above. Generally recommend total of 4 weeks out from his event before returning to work, we discussed this today. Will complete FMLA paperwork when available to me. Anticipate return to work without restriction on July 11.  2. Patient continues on high-dose Lipitor. Follow-up FLP and LFT for next visit.  3. Recent episode diverticulitis, also suspected gastritis. He will complete antibiotics soon. Would remain on Protonix.  Current medicines were reviewed at length with the patient today.  Disposition: FU with me in 6 weeks.   Signed, Jonelle SidleSamuel G. Tanith Dagostino, MD, [facility] 12/13/2014 2:04 PM    [facility] [address], [place], [place] [address] Phone: [phone](336) [phone]; Fax: [phone]([phone]

## 2014-12-13 NOTE — Addendum Note (Signed)
Addended by: Marlyn CorporalARLTON, Malynda Smolinski A on: 12/13/2014 03:19 PM   Modules accepted: Orders

## 2014-12-13 NOTE — Patient Instructions (Addendum)
Your physician recommends that you schedule a follow-up appointment in: 6 weeks with Dr.McDowell  Please have FASTING lab work done just before next visit  Your physician recommends that you continue on your current medications as directed. Please refer to the Current Medication list given to you today.     Thank you for choosing [facility] !

## 2014-12-31 ENCOUNTER — Telehealth: Payer: Self-pay | Admitting: Nurse Practitioner

## 2014-12-31 ENCOUNTER — Encounter: Payer: Self-pay | Admitting: Nurse Practitioner

## 2014-12-31 ENCOUNTER — Ambulatory Visit: Payer: BLUE CROSS/BLUE SHIELD | Admitting: Nurse Practitioner

## 2014-12-31 NOTE — Telephone Encounter (Signed)
PATIENT WAS A NO SHOW AND LETTER SENT  °

## 2014-12-31 NOTE — Telephone Encounter (Signed)
Noted  

## 2015-01-22 ENCOUNTER — Encounter: Payer: Self-pay | Admitting: Cardiology

## 2015-02-19 ENCOUNTER — Ambulatory Visit: Payer: BLUE CROSS/BLUE SHIELD | Admitting: Cardiology

## 2015-03-12 ENCOUNTER — Encounter: Payer: Self-pay | Admitting: Cardiology

## 2015-03-12 ENCOUNTER — Ambulatory Visit (INDEPENDENT_AMBULATORY_CARE_PROVIDER_SITE_OTHER): Payer: BLUE CROSS/BLUE SHIELD | Admitting: Cardiology

## 2015-03-12 VITALS — BP 142/86 | HR 78 | Ht 66.0 in | Wt 190.6 lb

## 2015-03-12 DIAGNOSIS — I2510 Atherosclerotic heart disease of native coronary artery without angina pectoris: Secondary | ICD-10-CM | POA: Diagnosis not present

## 2015-03-12 DIAGNOSIS — E785 Hyperlipidemia, unspecified: Secondary | ICD-10-CM | POA: Diagnosis not present

## 2015-03-12 DIAGNOSIS — Z720 Tobacco use: Secondary | ICD-10-CM

## 2015-03-12 NOTE — Progress Notes (Signed)
Cardiology Office Note  Date: 03/12/2015   ID: Rodney Mahoney, DOB 04/05/1961, MRN [id number]  PCP: Isabella Stalling, MD  Primary Cardiologist: Nona Dell, MD   Chief Complaint  Patient presents with  . Coronary Artery Disease    History of Present Illness: Rodney Mahoney is a 54 y.o. male last seen in June.  He presents for a routine follow-up visit. He is now back at work with previous responsibilities, reports no angina symptoms or unusual shortness of breath. We reviewed his medications, he states that he just ran out for a few days, but refilled the medications today when he got paid.  He is due for follow-up FLP and LFTs. He remains on high-dose Lipitor.  He continues to smoke cigarettes, we have addressed the importance of smoking cessation, but he has not been able to quit so far.    Past Medical History  Diagnosis Date  . Hypertension   . Bronchitis   . CAD (coronary artery disease)     a. cath 11/26/2014 EF 45%, moderate to severe inferior hypokinesis, 90% small D1, 100% mid to distal RCA with minimal collateral, 40% mid to distal LCx. Medical therapy as RCA occlusion appears to be completed event b. echo 11/27/2014 EF 50-55%  . Hyperlipidemia   . MR (mitral regurgitation)     mild by echo 11/27/2014  . NSTEMI (non-ST elevated myocardial infarction)     11/26/14    Current Outpatient Prescriptions  Medication Sig Dispense Refill  . aspirin 81 MG chewable tablet Chew 1 tablet (81 mg total) by mouth daily.    Marland Kitchen atorvastatin (LIPITOR) 80 MG tablet Take 1 tablet (80 mg total) by mouth daily at 6 PM. 30 tablet 11  . HYDROcodone-acetaminophen (NORCO/VICODIN) 5-325 MG per tablet Take 1-2 tablets by mouth every 4 (four) hours as needed for moderate pain or severe pain. 30 tablet 0  . lisinopril (PRINIVIL,ZESTRIL) 2.5 MG tablet Take 1 tablet (2.5 mg total) by mouth daily. 30 tablet 11  . metoprolol tartrate (LOPRESSOR) 25 MG tablet Take 0.5 tablets (12.5 mg  total) by mouth 2 (two) times daily. 30 tablet 11  . nitroGLYCERIN (NITROSTAT) 0.4 MG SL tablet Place 1 tablet (0.4 mg total) under the tongue every 5 (five) minutes as needed for chest pain (up to 3 doses). 25 tablet 3  . pantoprazole (PROTONIX) 40 MG tablet Take 1 tablet (40 mg total) by mouth daily. 30 tablet 11   No current facility-administered medications for this visit.    Allergies:  Review of patient's allergies indicates no known allergies.   Social History: The patient  reports that he has been smoking Cigarettes.  He has been smoking about 0.50 packs per day. He does not have any smokeless tobacco history on file. He reports that he drinks alcohol. He reports that he does not use illicit drugs.   ROS:  Please see the history of present illness. Otherwise, complete review of systems is positive for none.  All other systems are reviewed and negative.   Physical Exam: VS:  BP 142/86 mmHg  Pulse 78  Ht  (1.676 m)  Wt 190 lb 9.6 oz (86.456 kg)  BMI 30.78 kg/m2  SpO2 95%, BMI Body mass index is 30.78 kg/(m^2).  Wt Readings from Last 3 Encounters:  03/12/15 190 lb 9.6 oz (86.456 kg)  12/13/14 190 lb 9.6 oz (86.456 kg)  12/07/14 186 lb 14.4 oz (84.777 kg)     General: Patient appears comfortable at rest.  HEENT: Conjunctiva and lids normal, oropharynx clear. Neck: Supple, no elevated JVP or carotid bruits, no thyromegaly. Lungs: Clear to auscultation, nonlabored breathing at rest. Cardiac: Regular rate and rhythm, no S3 or significant systolic murmur, no pericardial rub. Abdomen: Soft, nontender, no hepatomegaly, bowel sounds present, no guarding or rebound. Extremities: No pitting edema, distal pulses 2+.   ECG: ECG is not ordered today.  Recent Labwork: 12/08/2014: ALT 44; AST 25; BUN 12; Creatinine, Ser 0.84; Hemoglobin 12.6*; Platelets 428*; Potassium 4.2; Sodium 135   Other Studies Reviewed Today:  Echocardiogram 11/27/2014: Study Conclusions  - Left  ventricle: The cavity size was normal. Systolic function was normal. The estimated ejection fraction was in the range of 50% to 55%. Wall motion was normal; there were no regional wall motion abnormalities. Doppler parameters are consistent with abnormal left ventricular relaxation (grade 1 diastolic dysfunction). - Mitral valve: There was mild regurgitation.  ASSESSMENT AND PLAN:  1. CAD with NSTEMI in June, clinically stable at this time. He has disease involving a small diagonal as well as an occluded mid to distal RCA with minimal collaterals that was managed medically. LVEF is 50-55%. Would continue medical therapy and observation.  2. Tobacco abuse, smoking cessation discussed.  3. Hyperlipidemia, on high-dose Lipitor. Follow-up FLP and LFTs.  Current medicines were reviewed at length with the patient today.   Orders Placed This Encounter  Procedures  . Lipid Profile  . Hepatic function panel    Disposition: FU with me in 6 months.   Signed, [name], MD, [facility] 03/12/2015 4:37 PM    [facility] at [facility] [address], [place], [place] [address] Phone: [phone]; Fax: [phone]

## 2015-03-12 NOTE — Patient Instructions (Signed)
Your physician wants you to follow-up in:  6 months with Dr Randa Spike will receive a reminder letter in the mail two months in advance. If you don't receive a letter, please call our office to schedule the follow-up appointment.    Your physician recommends that you continue on your current medications as directed. Please refer to the Current Medication list given to you today.      Lab work : FASTING Lipid and LFT's      Thank you for choosing [facility] !

## 2015-11-15 ENCOUNTER — Encounter: Payer: Self-pay | Admitting: Cardiology

## 2015-11-15 ENCOUNTER — Ambulatory Visit (INDEPENDENT_AMBULATORY_CARE_PROVIDER_SITE_OTHER): Payer: BLUE CROSS/BLUE SHIELD | Admitting: Cardiology

## 2015-11-15 VITALS — BP 138/80 | HR 77 | Ht 66.0 in | Wt 193.0 lb

## 2015-11-15 DIAGNOSIS — I2510 Atherosclerotic heart disease of native coronary artery without angina pectoris: Secondary | ICD-10-CM | POA: Diagnosis not present

## 2015-11-15 DIAGNOSIS — E785 Hyperlipidemia, unspecified: Secondary | ICD-10-CM | POA: Diagnosis not present

## 2015-11-15 DIAGNOSIS — I10 Essential (primary) hypertension: Secondary | ICD-10-CM

## 2015-11-15 NOTE — Patient Instructions (Signed)
Your physician wants you to follow-up in: 6 months You will receive a reminder letter in the mail two months in advance. If you don't receive a letter, please call our office to schedule the follow-up appointment.    Your physician recommends that you continue on your current medications as directed. Please refer to the Current Medication list given to you today.    If you need a refill on your cardiac medications before your next appointment, please call your pharmacy.     Thank you for choosing [facility] !        

## 2015-11-15 NOTE — Progress Notes (Signed)
Cardiology Office Note  Date: 11/15/2015   ID: Rodney Mahoney, DOB April 05, 1961, MRN [id number]  PCP: Isabella Stalling, MD  Primary Cardiologist: Nona Dell, MD   Chief Complaint  Patient presents with  . Coronary Artery Disease    History of Present Illness: SEDERICK Mahoney is a 55 y.o. male last seen in September 2016.He presents for a routine follow-up visit. Still working full time, does not report any angina symptoms to require nitroglycerin. He reports NYHA class II dyspnea.  I reviewed his medications. Cardiac regimen includes aspirin, Plavix, Lipitor, lisinopril, Lopressor, and as needed nitroglycerin. He has had lipid follow-up with Dr. Janna Arch.  I reviewed his ECG today which shows sinus rhythm with LVH and repolarization abnormalities.  Past Medical History  Diagnosis Date  . Essential hypertension   . Bronchitis   . CAD (coronary artery disease)     a. cath 11/26/2014 EF 45%, moderate to severe inferior hypokinesis, 90% small D1, 100% mid to distal RCA with minimal collateral, 40% mid to distal LCx. Medical therapy as RCA occlusion appears to be completed event b. echo 11/27/2014 EF 50-55%  . Hyperlipidemia   . MR (mitral regurgitation)     Mild by echo 11/27/2014  . NSTEMI (non-ST elevated myocardial infarction) (HCC)     11/26/14    Current Outpatient Prescriptions  Medication Sig Dispense Refill  . aspirin 81 MG chewable tablet Chew 1 tablet (81 mg total) by mouth daily.    Marland Kitchen atorvastatin (LIPITOR) 80 MG tablet Take 1 tablet (80 mg total) by mouth daily at 6 PM. 30 tablet 11  . lisinopril (PRINIVIL,ZESTRIL) 2.5 MG tablet Take 1 tablet (2.5 mg total) by mouth daily. 30 tablet 11  . metoprolol tartrate (LOPRESSOR) 25 MG tablet Take 0.5 tablets (12.5 mg total) by mouth 2 (two) times daily. 30 tablet 11  . nitroGLYCERIN (NITROSTAT) 0.4 MG SL tablet Place 1 tablet (0.4 mg total) under the tongue every 5 (five) minutes as needed for chest pain (up to 3  doses). 25 tablet 3  . clopidogrel (PLAVIX) 75 MG tablet Take 75 mg by mouth daily.      No current facility-administered medications for this visit.   Allergies:  Review of patient's allergies indicates no known allergies.   Social History: The patient  reports that he has been smoking Cigarettes.  He has been smoking about 0.50 packs per day. He does not have any smokeless tobacco history on file. He reports that he drinks alcohol. He reports that he does not use illicit drugs.   ROS:  Please see the history of present illness. Otherwise, complete review of systems is positive for arthritic symptoms, recent URI.  All other systems are reviewed and negative.   Physical Exam: VS:  BP 138/80 mmHg  Pulse 77  Ht  (1.676 m)  Wt 193 lb (87.544 kg)  BMI 31.17 kg/m2  SpO2 96%, BMI Body mass index is 31.17 kg/(m^2).  Wt Readings from Last 3 Encounters:  11/15/15 193 lb (87.544 kg)  03/12/15 190 lb 9.6 oz (86.456 kg)  12/13/14 190 lb 9.6 oz (86.456 kg)    General: Patient appears comfortable at rest. HEENT: Conjunctiva and lids normal, oropharynx clear. Neck: Supple, no elevated JVP or carotid bruits, no thyromegaly. Lungs: Clear to auscultation, nonlabored breathing at rest. Cardiac: Regular rate and rhythm, no S3 or significant systolic murmur, no pericardial rub. Abdomen: Soft, nontender, no hepatomegaly, bowel sounds present, no guarding or rebound. Extremities: No pitting edema, distal  pulses 2+.  ECG: I personally reviewed the tracing from 12/05/2014 which showed sinus rhythm with LVH and old inferolateral infarct pattern..  Recent Labwork: 12/08/2014: ALT 44; AST 25; BUN 12; Creatinine, Ser 0.84; Hemoglobin 12.6*; Platelets 428*; Potassium 4.2; Sodium 135   Other Studies Reviewed Today:  Echocardiogram 11/27/2014: Study Conclusions  - Left ventricle: The cavity size was normal. Systolic function was normal. The estimated ejection fraction was in the range of 50% to  55%. Wall motion was normal; there were no regional wall motion abnormalities. Doppler parameters are consistent with abnormal left ventricular relaxation (grade 1 diastolic dysfunction). - Mitral valve: There was mild regurgitation.  Assessment and Plan:  1. Medically managed CAD with diseased small diagonal, and occluded mid to distal RCA with minor collaterals. LVEF 50-55% by follow-up assessment. Plan is to continue current regimen and observation. I recommended a walking regimen for exercise.  2. Essential hypertension, no changes made present regimen.  3. Hyperlipidemia, continues on Lipitor. Requesting most recent lab work from Dr. Janna Archondiego.  Current medicines were reviewed with the patient today.   Orders Placed This Encounter  Procedures  . EKG 12-Lead    Disposition: FU with me in 6 months.   Signed, Jonelle SidleSamuel G. McDowell, MD, [facility] 11/15/2015 10:14 AM    [facility] [address], [place], [place] [address] Phone: [phone]; Fax: [phone]

## 2015-11-29 ENCOUNTER — Other Ambulatory Visit: Payer: Self-pay | Admitting: Physician Assistant

## 2015-11-29 NOTE — Telephone Encounter (Signed)
Review for refill, Thank you. 

## 2015-11-30 ENCOUNTER — Emergency Department (HOSPITAL_COMMUNITY)
Admission: EM | Admit: 2015-11-30 | Discharge: 2015-11-30 | Disposition: A | Discharge status: Home / Self Care | Payer: BLUE CROSS/BLUE SHIELD | Attending: Emergency Medicine | Admitting: Emergency Medicine

## 2015-11-30 ENCOUNTER — Encounter (HOSPITAL_COMMUNITY): Payer: Self-pay | Admitting: Emergency Medicine

## 2015-11-30 DIAGNOSIS — I2510 Atherosclerotic heart disease of native coronary artery without angina pectoris: Secondary | ICD-10-CM | POA: Diagnosis not present

## 2015-11-30 DIAGNOSIS — E785 Hyperlipidemia, unspecified: Secondary | ICD-10-CM | POA: Insufficient documentation

## 2015-11-30 DIAGNOSIS — Z7982 Long term (current) use of aspirin: Secondary | ICD-10-CM | POA: Diagnosis not present

## 2015-11-30 DIAGNOSIS — K0510 Chronic gingivitis, plaque induced: Secondary | ICD-10-CM | POA: Diagnosis not present

## 2015-11-30 DIAGNOSIS — I10 Essential (primary) hypertension: Secondary | ICD-10-CM | POA: Diagnosis not present

## 2015-11-30 DIAGNOSIS — K0889 Other specified disorders of teeth and supporting structures: Secondary | ICD-10-CM | POA: Diagnosis present

## 2015-11-30 DIAGNOSIS — Z79899 Other long term (current) drug therapy: Secondary | ICD-10-CM | POA: Insufficient documentation

## 2015-11-30 DIAGNOSIS — F17210 Nicotine dependence, cigarettes, uncomplicated: Secondary | ICD-10-CM | POA: Diagnosis not present

## 2015-11-30 MED ORDER — AMOXICILLIN 500 MG PO CAPS: 500.0000 mg | ORAL_CAPSULE | Freq: Three times a day (TID) | ORAL | Status: DC

## 2015-11-30 MED ORDER — AMOXICILLIN 250 MG PO CAPS
1000.0000 mg | ORAL_CAPSULE | Freq: Once | ORAL | Status: AC
  Administered 2015-11-30: 1000 mg via ORAL
  Filled 2015-11-30: qty 4

## 2015-11-30 NOTE — ED Provider Notes (Signed)
History  By signing my name below, I, Earmon Phoenix, attest that this documentation has been prepared under the direction and in the presence of Donnetta Hutching, MD. Electronically Signed: Earmon Phoenix, ED Scribe. 11/30/2015. 11:25 AM.  Chief Complaint  Patient presents with  . Dental Pain   The history is provided by the patient and medical records. No language interpreter was used.    HPI Comments:  Rodney Mahoney is a 55 y.o. male who presents to the Emergency Department complaining of upper right-sided dental pain that began about two months ago. He states the pain began worsening today. He reports associated right-sided facial swelling. He states he has had similar symptoms in the past about two months ago and was prescribed antibiotics from his PCP, Dr. Janna Arch. He has not followed up with a dentist. He has taken Tylenol and Ibuprofen, last dose about two hours ago, with minimal relief of the symptoms. Eating and drinking increase his pain. He denies alleviating factors. He denies fever, chills, nausea, vomiting, difficulty breathing or swallowing.   Past Medical History  Diagnosis Date  . Essential hypertension   . Bronchitis   . CAD (coronary artery disease)     a. cath 11/26/2014 EF 45%, moderate to severe inferior hypokinesis, 90% small D1, 100% mid to distal RCA with minimal collateral, 40% mid to distal LCx. Medical therapy as RCA occlusion appears to be completed event b. echo 11/27/2014 EF 50-55%  . Hyperlipidemia   . MR (mitral regurgitation)     Mild by echo 11/27/2014  . NSTEMI (non-ST elevated myocardial infarction) (HCC)     11/26/14   Past Surgical History  Procedure Laterality Date  . Neck surgery  2005  . Cardiac catheterization N/A 11/26/2014    Procedure: Left Heart Cath and Coronary Angiography;  Surgeon: Peter M Swaziland, MD;  Location: [facility] INVASIVE CV [facility];  Service: Cardiovascular;  Laterality: N/A;  . Colonoscopy      per patient: done in [place], age 41,  couple of polyps.   Family History  Problem Relation Age of Onset  . Heart attack Father   . Colon cancer Neg Hx    Social History  Substance Use Topics  . Smoking status: Current Every Day Smoker -- 0.50 packs/day    Types: Cigarettes  . Smokeless tobacco: Never Used  . Alcohol Use: 0.0 oz/week    0 Standard drinks or equivalent per week     Comment: daily for decades: 80-120 ounces of beer daily (noted 12/07/14)    Review of Systems  A complete 10 system review of systems was obtained and all systems are negative except as noted in the HPI and PMH.   Allergies  Review of patient's allergies indicates no known allergies.  Home Medications   Prior to Admission medications   Medication Sig Start Date End Date Taking? Authorizing Provider  amoxicillin (AMOXIL) 500 MG capsule Take 1 capsule (500 mg total) by mouth 3 (three) times daily. 11/30/15   Donnetta Hutching, MD  aspirin 81 MG chewable tablet Chew 1 tablet (81 mg total) by mouth daily. 11/28/14   Azalee Course, PA  atorvastatin (LIPITOR) 80 MG tablet TAKE ONE TABLET BY MOUTH ONCE DAILY AT  6  PM 11/29/15   [name], MD  clopidogrel (PLAVIX) 75 MG tablet Take 75 mg by mouth daily.  10/11/15   Historical Provider, MD  clopidogrel (PLAVIX) 75 MG tablet TAKE ONE TABLET BY MOUTH ONCE DAILY WITH BREAKFAST 11/29/15   [name],  MD  lisinopril (PRINIVIL,ZESTRIL) 2.5 MG tablet TAKE ONE TABLET BY MOUTH ONCE DAILY 11/29/15   Jonelle SidleSamuel G McDowell, MD  metoprolol tartrate (LOPRESSOR) 25 MG tablet TAKE ONE-HALF TABLET BY MOUTH TWICE DAILY 11/29/15   Jonelle SidleSamuel G McDowell, MD  nitroGLYCERIN (NITROSTAT) 0.4 MG SL tablet Place 1 tablet (0.4 mg total) under the tongue every 5 (five) minutes as needed for chest pain (up to 3 doses). 11/29/14   Dayna N Dunn, PA-C   Triage Vitals: BP 177/79 mmHg  Pulse 79  Temp(Src) 97.9 F (36.6 C) (Oral)  Resp 18  Ht 5\' 6"  (1.676 m)  Wt 195 lb (88.451 kg)  BMI 31.49 kg/m2  SpO2 98% Physical Exam  Constitutional:  He is oriented to person, place, and time. He appears well-developed and well-nourished.  HENT:  Head: Normocephalic and atraumatic.  Gingival inflammation surrounding tooth #3 and #4.  Eyes: Conjunctivae and EOM are normal. Pupils are equal, round, and reactive to light.  Neck: Normal range of motion. Neck supple.  Cardiovascular: Normal rate and regular rhythm.   Pulmonary/Chest: Effort normal and breath sounds normal.  Abdominal: Soft. Bowel sounds are normal.  Musculoskeletal: Normal range of motion.  Neurological: He is alert and oriented to person, place, and time.  Skin: Skin is warm and dry.  Psychiatric: He has a normal mood and affect. His behavior is normal.  Nursing note and vitals reviewed.   ED Course  Procedures (including critical care time) DIAGNOSTIC STUDIES: Oxygen Saturation is 98% on RA, normal by my interpretation.   COORDINATION OF CARE: 11:22 AM- Will prescribe antibiotics and give first dose here. Offered to prescribe pain medication but pt states he has some at home. Will give referral to a dentist. Pt verbalizes understanding and agrees to plan.  Medications  amoxicillin (AMOXIL) capsule 1,000 mg (1,000 mg Oral Given 11/30/15 1130)     MDM   Final diagnoses:  Gingivitis    Patient is in no acute distress. Rx amoxicillin 500 mg 3 times a day. Referral to dentist  I personally performed the services described in this documentation, which was scribed in my presence. The recorded information has been reviewed and is accurate.      Donnetta HutchingBrian Antawn Sison, MD 11/30/15 1158

## 2015-11-30 NOTE — Discharge Instructions (Signed)
Gingivitis  Gingivitis is an infection of the teeth and bones that support the teeth. Your gums become red, sore, and puffy (swollen). It is caused by germs that build up on your teeth and gums (plaque). HOME CARE  Floss and then brush your teeth.  Brush at least twice a day.  Floss at least once a day.  Avoid sugar between meals.  Do not drink juice before bed. Only drink water.  Make and keep your regular checkups and cleanings with your dentist.  Use any mouth care product or toothpaste as told by your dentist. GET HELP RIGHT AWAY IF:  You have painful, red tissue around your teeth.  You have trouble chewing.  You have loose or infected teeth. MAKE SURE YOU:  Understand these instructions.  Will watch your condition.  Will get help right away if you are not doing well or get worse.   This information is not intended to replace advice given to you by your health care provider. Make sure you discuss any questions you have with your health care provider.   Document Released: 07/04/2010 Document Revised: 08/24/2011 Document Reviewed: 01/14/2015 Elsevier Interactive Patient Education 2016 [employer]   Prescription for antibiotic. Can also take ibuprofen for pain. Follow-up with dentist. Phone number given.

## 2015-11-30 NOTE — ED Notes (Signed)
Patient c/o right upper dental pain that became more severe today. Patient states he took tylenol and ibuprofen 2 hours ago with no relief. Per patient pain radiating into jaw.

## 2016-05-12 NOTE — Progress Notes (Deleted)
Cardiology Office Note  Date: 05/12/2016   ID: Rodney Angerobert D Wingard, DOB 08/06/1960, MRN [id number]  PCP: Isabella StallingNDIEGO,RICHARD M, MD  Primary Cardiologist: Nona DellSamuel Moriah Loughry, MD   No chief complaint on file.   History of Present Illness: Rodney Mahoney is a 55 y.o. male last seen in June.  Past Medical History:  Diagnosis Date  . Bronchitis   . CAD (coronary artery disease)    a. cath 11/26/2014 EF 45%, moderate to severe inferior hypokinesis, 90% small D1, 100% mid to distal RCA with minimal collateral, 40% mid to distal LCx. Medical therapy as RCA occlusion appears to be completed event b. echo 11/27/2014 EF 50-55%  . Essential hypertension   . Hyperlipidemia   . MR (mitral regurgitation)    Mild by echo 11/27/2014  . NSTEMI (non-ST elevated myocardial infarction) (HCC)    11/26/14    Past Surgical History:  Procedure Laterality Date  . CARDIAC CATHETERIZATION N/A 11/26/2014   Procedure: Left Heart Cath and Coronary Angiography;  Surgeon: Peter M SwazilandJordan, MD;  [facility];  Service: Cardiovascular;  Laterality: N/A;  . COLONOSCOPY     per patient: done in [place], age 89, couple of polyps.  Marland Kitchen. NECK SURGERY  2005    Current Outpatient Prescriptions  Medication Sig Dispense Refill  . amoxicillin (AMOXIL) 500 MG capsule Take 1 capsule (500 mg total) by mouth 3 (three) times daily. 30 capsule 0  . aspirin 81 MG chewable tablet Chew 1 tablet (81 mg total) by mouth daily.    Marland Kitchen. atorvastatin (LIPITOR) 80 MG tablet TAKE ONE TABLET BY MOUTH ONCE DAILY AT  6  PM 30 tablet 6  . clopidogrel (PLAVIX) 75 MG tablet Take 75 mg by mouth daily.     . clopidogrel (PLAVIX) 75 MG tablet TAKE ONE TABLET BY MOUTH ONCE DAILY WITH BREAKFAST 30 tablet 6  . lisinopril (PRINIVIL,ZESTRIL) 2.5 MG tablet TAKE ONE TABLET BY MOUTH ONCE DAILY 30 tablet 6  . metoprolol tartrate (LOPRESSOR) 25 MG tablet TAKE ONE-HALF TABLET BY MOUTH TWICE DAILY 30 tablet 6  . nitroGLYCERIN (NITROSTAT) 0.4 MG SL  tablet Place 1 tablet (0.4 mg total) under the tongue every 5 (five) minutes as needed for chest pain (up to 3 doses). 25 tablet 3   No current facility-administered medications for this visit.    Allergies:  Patient has no known allergies.   Social History: The patient  reports that he has been smoking Cigarettes.  He has been smoking about 0.50 packs per day. He has never used smokeless tobacco. He reports that he drinks alcohol. He reports that he does not use drugs.   Family History: The patient's family history includes Heart attack in his father.   ROS:  Please see the history of present illness. Otherwise, complete review of systems is positive for {NONE DEFAULTED:18576::"none"}.  All other systems are reviewed and negative.   Physical Exam: VS:  There were no vitals taken for this visit., BMI There is no height or weight on file to calculate BMI.  Wt Readings from Last 3 Encounters:  11/30/15 195 lb (88.5 kg)  11/15/15 193 lb (87.5 kg)  03/12/15 190 lb 9.6 oz (86.5 kg)    General: Patient appears comfortable at rest. HEENT: Conjunctiva and lids normal, oropharynx clear. Neck: Supple, no elevated JVP or carotid bruits, no thyromegaly. Lungs: Clear to auscultation, nonlabored breathing at rest. Cardiac: Regular rate and rhythm, no S3 or significant systolic murmur, no pericardial rub. Abdomen: Soft,  nontender, no hepatomegaly, bowel sounds present, no guarding or rebound. Extremities: No pitting edema, distal pulses 2+.  ECG: I personally reviewed the tracing from 11/15/2015 which showed sinus rhythm with LVH and repolarization abnormalities.  Recent Labwork:  June 2016: Hemoglobin 12.6, platelets 428, potassium 4.2, BUN 12, creatinine 0.8, AST 25, ALT 44  Other Studies Reviewed Today:  Echocardiogram 11/27/2014: Study Conclusions  - Left ventricle: The cavity size was normal. Systolic function was normal. The estimated ejection fraction was in the range of 50% to 55%.  Wall motion was normal; there were no regional wall motion abnormalities. Doppler parameters are consistent with abnormal left ventricular relaxation (grade 1 diastolic dysfunction). - Mitral valve: There was mild regurgitation.  Assessment and Plan:   Current medicines were reviewed with the patient today.  No orders of the defined types were placed in this encounter.   Disposition:  Signed, Jonelle SidleSamuel G. Jennah Satchell, MD, [facility] 05/12/2016 3:47 PM    [facility] at [facility] [address], [place], [place] [address] Phone: [phone]; Fax: [phone]

## 2016-05-13 ENCOUNTER — Ambulatory Visit: Payer: BLUE CROSS/BLUE SHIELD | Admitting: Cardiology

## 2016-05-13 ENCOUNTER — Encounter: Payer: Self-pay | Admitting: Cardiology

## 2016-09-11 ENCOUNTER — Other Ambulatory Visit: Payer: Self-pay | Admitting: Cardiology

## 2016-12-31 ENCOUNTER — Other Ambulatory Visit: Payer: Self-pay | Admitting: Cardiology

## 2016-12-31 MED ORDER — ATORVASTATIN CALCIUM 80 MG PO TABS: ORAL_TABLET | ORAL | 1 refills | Status: DC

## 2016-12-31 MED ORDER — METOPROLOL TARTRATE 25 MG PO TABS: 12.5000 mg | ORAL_TABLET | Freq: Two times a day (BID) | ORAL | 0 refills | Status: DC

## 2016-12-31 MED ORDER — CLOPIDOGREL BISULFATE 75 MG PO TABS: ORAL_TABLET | ORAL | 0 refills | Status: DC

## 2016-12-31 MED ORDER — LISINOPRIL 2.5 MG PO TABS: 2.5000 mg | ORAL_TABLET | Freq: Every day | ORAL | 1 refills | Status: DC

## 2016-12-31 NOTE — Telephone Encounter (Signed)
LM to pt that he should take his medication as prescribed and [facility]

## 2016-12-31 NOTE — Telephone Encounter (Signed)
Pt lost his ins and hasn't had any of his meds in 4 months. He's scheduled a f/u apt w/ Dr. Diona BrownerMcDowell for 02/23/17 --he'd like to know if he should start back on his meds

## 2017-02-22 NOTE — Progress Notes (Signed)
Cardiology Office Note  Date: 02/23/2017   ID: Rodney Mahoney, DOB 08/06/1960, MRN [id number]  PCP: Oval Linseyondiego, Richard, MD  Primary Cardiologist: Nona DellSamuel Rakisha Pincock, MD   Chief Complaint  Patient presents with  . Coronary Artery Disease    History of Present Illness: Rodney Mahoney is a 56 y.o. male that I last saw in June 2017. He presents for a follow-up visit. Reports no angina symptoms or unusual shortness of breath. He has changed jobs and insurance since I saw him. States that he was out of his medications for about 3 months. He has not had a follow-up visit with PCP.  We went over medication list and made sure refills were provided. He has not had a follow-up lipid panel or LFTs which we will arrange.  He continues to smoke cigarettes, we have discussed smoking cessation. Also not exercising as regularly but does plan to get back to it. He has been walking and using weights previously. He would like to lose at least 10 or 20 pounds.  I personally reviewed his ECG today which shows sinus rhythm with LVH.  Past Medical History:  Diagnosis Date  . Bronchitis   . CAD (coronary artery disease)    a. cath 11/26/2014 EF 45%, moderate to severe inferior hypokinesis, 90% small D1, 100% mid to distal RCA with minimal collateral, 40% mid to distal LCx. Medical therapy as RCA occlusion appears to be completed event b. echo 11/27/2014 EF 50-55%  . Essential hypertension   . Hyperlipidemia   . MR (mitral regurgitation)    Mild by echo 11/27/2014  . NSTEMI (non-ST elevated myocardial infarction) (HCC)    11/26/14    Past Surgical History:  Procedure Laterality Date  . CARDIAC CATHETERIZATION N/A 11/26/2014   Procedure: Left Heart Cath and Coronary Angiography;  Surgeon: Peter M SwazilandJordan, MD;  [facility];  Service: Cardiovascular;  Laterality: N/A;  . COLONOSCOPY     per patient: done in [place], age 56, couple of polyps.  Marland Kitchen. NECK SURGERY  2005    Current Outpatient  Prescriptions  Medication Sig Dispense Refill  . aspirin 81 MG chewable tablet Chew 1 tablet (81 mg total) by mouth daily.    Marland Kitchen. atorvastatin (LIPITOR) 80 MG tablet TAKE ONE TABLET BY MOUTH ONCE DAILY AT  6  PM 30 tablet 1  . clopidogrel (PLAVIX) 75 MG tablet TAKE ONE TABLET BY MOUTH ONCE DAILY WITH BREAKFAST 30 tablet 0  . lisinopril (PRINIVIL,ZESTRIL) 2.5 MG tablet Take 1 tablet (2.5 mg total) by mouth daily. 30 tablet 1  . metoprolol tartrate (LOPRESSOR) 25 MG tablet Take 0.5 tablets (12.5 mg total) by mouth 2 (two) times daily. 30 tablet 0  . nitroGLYCERIN (NITROSTAT) 0.4 MG SL tablet Place 1 tablet (0.4 mg total) under the tongue every 5 (five) minutes as needed for chest pain (up to 3 doses). 25 tablet 3   No current facility-administered medications for this visit.    Allergies:  Patient has no known allergies.   Social History: The patient  reports that he has been smoking Cigarettes.  He has been smoking about 0.50 packs per day. He has never used smokeless tobacco. He reports that he drinks alcohol. He reports that he does not use drugs.   ROS:  Please see the history of present illness. Otherwise, complete review of systems is positive for erectile dysfunction.  All other systems are reviewed and negative.   Physical Exam: VS:  BP 120/70  Pulse 79   Ht  (1.676 m)   Wt 196 lb (88.9 kg)   SpO2 96%   BMI 31.64 kg/m , BMI Body mass index is 31.64 kg/m.  Wt Readings from Last 3 Encounters:  02/23/17 196 lb (88.9 kg)  11/30/15 195 lb (88.5 kg)  11/15/15 193 lb (87.5 kg)    General: Overweight male, appears comfortable at rest. HEENT: Conjunctiva and lids normal, oropharynx clear with poor dentition. Neck: Supple, no elevated JVP or carotid bruits, no thyromegaly. Lungs: Clear to auscultation, nonlabored breathing at rest. Cardiac: Regular rate and rhythm, no S3 or significant systolic murmur, no pericardial rub. Abdomen: Soft, nontender, no hepatomegaly, bowel sounds  present, no guarding or rebound. Extremities: No pitting edema, distal pulses 2+. Skin: Warm and dry. Musculoskeletal: No kyphosis. Neuropsychiatric: Alert and oriented 3, affect appropriate.  ECG: I personally reviewed the tracing from 11/15/2015 which showed sinus rhythm with LVH and repolarization abnormalities.  Recent Labwork:  12/08/2014: ALT 44; AST 25; BUN 12; Creatinine, Ser 0.84; Hemoglobin 12.6*; Platelets 428*; Potassium 4.2; Sodium 135   Other Studies Reviewed Today:  Echocardiogram 11/27/2014: Study Conclusions  - Left ventricle: The cavity size was normal. Systolic function was normal. The estimated ejection fraction was in the range of 50% to 55%. Wall motion was normal; there were no regional wall motion abnormalities. Doppler parameters are consistent with abnormal left ventricular relaxation (grade 1 diastolic dysfunction). - Mitral valve: There was mild regurgitation.  Assessment and Plan:  1. CAD with diseased small diagonal and occluded mid to distal RCA with minor collaterals. He has been managed medically and reports no angina symptoms. We provided refills. I reinforced compliance with medications and also exercise plan.  2. Ongoing tobacco abuse. We discussed smoking cessation.  3. Hyperlipidemia, back on Lipitor. Check FLP and LFTs.  4. Essential hypertension, blood pressure is normal today. No changes were made in regimen.  Current medicines were reviewed with the patient today.   Orders Placed This Encounter  Procedures  . Hepatic function panel  . Lipid Profile  . EKG 12-Lead    Disposition: Follow-up in one year.  Signed, [name], MD, [facility] 02/23/2017 4:46 PM    [facility] at [facility] [address], [place], [place] [address] Phone: [phone]; Fax: [phone]

## 2017-02-23 ENCOUNTER — Ambulatory Visit (INDEPENDENT_AMBULATORY_CARE_PROVIDER_SITE_OTHER): Payer: BLUE CROSS/BLUE SHIELD | Admitting: Cardiology

## 2017-02-23 ENCOUNTER — Encounter: Payer: Self-pay | Admitting: Cardiology

## 2017-02-23 VITALS — BP 120/70 | HR 79 | Ht 66.0 in | Wt 196.0 lb

## 2017-02-23 DIAGNOSIS — Z720 Tobacco use: Secondary | ICD-10-CM

## 2017-02-23 DIAGNOSIS — I2510 Atherosclerotic heart disease of native coronary artery without angina pectoris: Secondary | ICD-10-CM

## 2017-02-23 DIAGNOSIS — E782 Mixed hyperlipidemia: Secondary | ICD-10-CM

## 2017-02-23 DIAGNOSIS — I10 Essential (primary) hypertension: Secondary | ICD-10-CM

## 2017-02-23 MED ORDER — ATORVASTATIN CALCIUM 80 MG PO TABS: ORAL_TABLET | ORAL | 3 refills | Status: DC

## 2017-02-23 MED ORDER — NITROGLYCERIN 0.4 MG SL SUBL: 0.4000 mg | SUBLINGUAL_TABLET | SUBLINGUAL | 3 refills | Status: DC | PRN

## 2017-02-23 MED ORDER — CLOPIDOGREL BISULFATE 75 MG PO TABS
ORAL_TABLET | ORAL | 3 refills | Status: DC
Start: 2017-02-23 — End: 2018-02-02

## 2017-02-23 MED ORDER — LISINOPRIL 2.5 MG PO TABS: 2.5000 mg | ORAL_TABLET | Freq: Every day | ORAL | 3 refills | Status: DC

## 2017-02-23 MED ORDER — METOPROLOL TARTRATE 25 MG PO TABS: 12.5000 mg | ORAL_TABLET | Freq: Two times a day (BID) | ORAL | 3 refills | Status: DC

## 2017-02-23 NOTE — Patient Instructions (Signed)
Your physician wants you to follow-up in: 1 year with Dr Randa SpikeMcDowell You will receive a reminder letter in the mail two months in advance. If you don't receive a letter, please call our office to schedule the follow-up appointment.   Your physician recommends that you continue on your current medications as directed. Please refer to the Current Medication list given to you today.    If you need a refill on your cardiac medications before your next appointment, please call your pharmacy.     Please get FASTING lipids ana LFT's       Thank you for choosing [facility] !

## 2018-01-31 ENCOUNTER — Emergency Department (HOSPITAL_COMMUNITY): Payer: Self-pay

## 2018-01-31 ENCOUNTER — Encounter (HOSPITAL_COMMUNITY): Payer: Self-pay | Admitting: Emergency Medicine

## 2018-01-31 ENCOUNTER — Inpatient Hospital Stay (HOSPITAL_COMMUNITY)
Admission: EM | Disposition: A | Discharge status: Home / Self Care | Payer: Self-pay | Source: Home / Self Care | Attending: Cardiovascular Disease

## 2018-01-31 ENCOUNTER — Inpatient Hospital Stay (HOSPITAL_COMMUNITY)
Admission: EM | Admit: 2018-01-31 | Discharge: 2018-02-02 | DRG: 246 | Disposition: A | Discharge status: Home / Self Care | Payer: Self-pay | Attending: Cardiovascular Disease | Admitting: Cardiovascular Disease

## 2018-01-31 ENCOUNTER — Other Ambulatory Visit: Payer: Self-pay

## 2018-01-31 DIAGNOSIS — I252 Old myocardial infarction: Secondary | ICD-10-CM

## 2018-01-31 DIAGNOSIS — K0889 Other specified disorders of teeth and supporting structures: Secondary | ICD-10-CM | POA: Diagnosis present

## 2018-01-31 DIAGNOSIS — Z23 Encounter for immunization: Secondary | ICD-10-CM

## 2018-01-31 DIAGNOSIS — I5021 Acute systolic (congestive) heart failure: Secondary | ICD-10-CM

## 2018-01-31 DIAGNOSIS — Z91120 Patient's intentional underdosing of medication regimen due to financial hardship: Secondary | ICD-10-CM

## 2018-01-31 DIAGNOSIS — Z79899 Other long term (current) drug therapy: Secondary | ICD-10-CM

## 2018-01-31 DIAGNOSIS — I493 Ventricular premature depolarization: Secondary | ICD-10-CM | POA: Diagnosis present

## 2018-01-31 DIAGNOSIS — Z955 Presence of coronary angioplasty implant and graft: Secondary | ICD-10-CM

## 2018-01-31 DIAGNOSIS — I214 Non-ST elevation (NSTEMI) myocardial infarction: Principal | ICD-10-CM | POA: Diagnosis present

## 2018-01-31 DIAGNOSIS — I110 Hypertensive heart disease with heart failure: Secondary | ICD-10-CM | POA: Diagnosis present

## 2018-01-31 DIAGNOSIS — Z8249 Family history of ischemic heart disease and other diseases of the circulatory system: Secondary | ICD-10-CM

## 2018-01-31 DIAGNOSIS — E785 Hyperlipidemia, unspecified: Secondary | ICD-10-CM | POA: Diagnosis present

## 2018-01-31 DIAGNOSIS — I340 Nonrheumatic mitral (valve) insufficiency: Secondary | ICD-10-CM | POA: Diagnosis present

## 2018-01-31 DIAGNOSIS — I2510 Atherosclerotic heart disease of native coronary artery without angina pectoris: Secondary | ICD-10-CM | POA: Diagnosis present

## 2018-01-31 DIAGNOSIS — F17210 Nicotine dependence, cigarettes, uncomplicated: Secondary | ICD-10-CM | POA: Diagnosis present

## 2018-01-31 DIAGNOSIS — I10 Essential (primary) hypertension: Secondary | ICD-10-CM | POA: Diagnosis present

## 2018-01-31 DIAGNOSIS — Z598 Other problems related to housing and economic circumstances: Secondary | ICD-10-CM

## 2018-01-31 DIAGNOSIS — I208 Other forms of angina pectoris: Secondary | ICD-10-CM

## 2018-01-31 DIAGNOSIS — T50996A Underdosing of other drugs, medicaments and biological substances, initial encounter: Secondary | ICD-10-CM | POA: Diagnosis present

## 2018-01-31 DIAGNOSIS — Z7982 Long term (current) use of aspirin: Secondary | ICD-10-CM

## 2018-01-31 DIAGNOSIS — I5022 Chronic systolic (congestive) heart failure: Secondary | ICD-10-CM

## 2018-01-31 DIAGNOSIS — I5023 Acute on chronic systolic (congestive) heart failure: Secondary | ICD-10-CM | POA: Diagnosis present

## 2018-01-31 DIAGNOSIS — Z716 Tobacco abuse counseling: Secondary | ICD-10-CM

## 2018-01-31 DIAGNOSIS — Z7902 Long term (current) use of antithrombotics/antiplatelets: Secondary | ICD-10-CM

## 2018-01-31 DIAGNOSIS — Z720 Tobacco use: Secondary | ICD-10-CM

## 2018-01-31 HISTORY — PX: CORONARY STENT INTERVENTION: CATH118234

## 2018-01-31 HISTORY — PX: LEFT HEART CATH AND CORONARY ANGIOGRAPHY: CATH118249

## 2018-01-31 LAB — I-STAT TROPONIN, ED: TROPONIN I, POC: 0.55 ng/mL — AB (ref 0.00–0.08)

## 2018-01-31 LAB — CBC
HCT: 41.4 % (ref 39.0–52.0)
Hemoglobin: 13.8 g/dL (ref 13.0–17.0)
MCH: 30.4 pg (ref 26.0–34.0)
MCHC: 33.3 g/dL (ref 30.0–36.0)
MCV: 91.2 fL (ref 78.0–100.0)
Platelets: 247 10*3/uL (ref 150–400)
RBC: 4.54 MIL/uL (ref 4.22–5.81)
RDW: 14.8 % (ref 11.5–15.5)
WBC: 8 10*3/uL (ref 4.0–10.5)

## 2018-01-31 LAB — BASIC METABOLIC PANEL
Anion gap: 9 (ref 5–15)
BUN: 8 mg/dL (ref 6–20)
CALCIUM: 9.2 mg/dL (ref 8.9–10.3)
CHLORIDE: 105 mmol/L (ref 98–111)
CO2: 25 mmol/L (ref 22–32)
CREATININE: 0.65 mg/dL (ref 0.61–1.24)
Glucose, Bld: 111 mg/dL — ABNORMAL HIGH (ref 70–99)
Potassium: 4.2 mmol/L (ref 3.5–5.1)
SODIUM: 139 mmol/L (ref 135–145)

## 2018-01-31 LAB — TROPONIN I: Troponin I: >65 ng/mL (ref <0.03)

## 2018-01-31 LAB — POCT ACTIVATED CLOTTING TIME: ACTIVATED CLOTTING TIME: 697 s

## 2018-01-31 LAB — MRSA PCR SCREENING: MRSA BY PCR: NEGATIVE

## 2018-01-31 SURGERY — LEFT HEART CATH AND CORONARY ANGIOGRAPHY
Anesthesia: LOCAL

## 2018-01-31 MED ORDER — IOPAMIDOL (ISOVUE-370) INJECTION 76%
INTRAVENOUS | Status: AC
  Filled 2018-01-31: qty 100

## 2018-01-31 MED ORDER — NITROGLYCERIN IN D5W 200-5 MCG/ML-% IV SOLN
INTRAVENOUS | Status: AC
  Filled 2018-01-31: qty 250

## 2018-01-31 MED ORDER — BIVALIRUDIN TRIFLUOROACETATE 250 MG IV SOLR
INTRAVENOUS | Status: AC
  Filled 2018-01-31: qty 250

## 2018-01-31 MED ORDER — ATORVASTATIN CALCIUM 80 MG PO TABS
80.0000 mg | ORAL_TABLET | Freq: Every day | ORAL | Status: DC
  Administered 2018-01-31 – 2018-02-01 (×2): 80 mg via ORAL
  Filled 2018-01-31 (×2): qty 1

## 2018-01-31 MED ORDER — SODIUM CHLORIDE 0.9% FLUSH: 3.0000 mL | Freq: Two times a day (BID) | INTRAVENOUS | Status: DC

## 2018-01-31 MED ORDER — MIDAZOLAM HCL 2 MG/2ML IJ SOLN
INTRAMUSCULAR | Status: DC | PRN
  Administered 2018-01-31: 1 mg via INTRAVENOUS

## 2018-01-31 MED ORDER — HEPARIN (PORCINE) IN NACL 100-0.45 UNIT/ML-% IJ SOLN
1000.0000 [IU]/h | INTRAMUSCULAR | Status: DC
  Administered 2018-01-31: 1000 [IU]/h via INTRAVENOUS
  Filled 2018-01-31: qty 250

## 2018-01-31 MED ORDER — SODIUM CHLORIDE 0.9 % IV SOLN
1.7500 mg/kg/h | INTRAVENOUS | Status: AC
  Administered 2018-01-31 (×3): 1.75 mg/kg/h via INTRAVENOUS
  Filled 2018-01-31: qty 250

## 2018-01-31 MED ORDER — TICAGRELOR 90 MG PO TABS
ORAL_TABLET | ORAL | Status: DC | PRN
  Administered 2018-01-31: 180 mg via ORAL

## 2018-01-31 MED ORDER — LIDOCAINE HCL (PF) 1 % IJ SOLN
INTRAMUSCULAR | Status: AC
  Filled 2018-01-31: qty 30

## 2018-01-31 MED ORDER — TICAGRELOR 90 MG PO TABS
90.0000 mg | ORAL_TABLET | Freq: Two times a day (BID) | ORAL | Status: DC
  Administered 2018-01-31 – 2018-02-02 (×4): 90 mg via ORAL
  Filled 2018-01-31 (×4): qty 1

## 2018-01-31 MED ORDER — SODIUM CHLORIDE 0.9 % IV SOLN: 250.0000 mL | INTRAVENOUS | Status: DC | PRN

## 2018-01-31 MED ORDER — ASPIRIN 81 MG PO CHEW: 81.0000 mg | CHEWABLE_TABLET | ORAL | Status: DC

## 2018-01-31 MED ORDER — HEPARIN (PORCINE) IN NACL 1000-0.9 UT/500ML-% IV SOLN
INTRAVENOUS | Status: DC | PRN
  Administered 2018-01-31 (×2): 500 mL

## 2018-01-31 MED ORDER — MIDAZOLAM HCL 2 MG/2ML IJ SOLN
INTRAMUSCULAR | Status: AC
  Filled 2018-01-31: qty 2

## 2018-01-31 MED ORDER — SODIUM CHLORIDE 0.9 % WEIGHT BASED INFUSION: 3.0000 mL/kg/h | INTRAVENOUS | Status: DC

## 2018-01-31 MED ORDER — IOPAMIDOL (ISOVUE-370) INJECTION 76%
INTRAVENOUS | Status: DC | PRN
  Administered 2018-01-31: 150 mL

## 2018-01-31 MED ORDER — NITROGLYCERIN 2 % TD OINT
1.0000 [in_us] | TOPICAL_OINTMENT | Freq: Once | TRANSDERMAL | Status: AC
  Administered 2018-01-31: 1 [in_us] via TOPICAL
  Filled 2018-01-31: qty 1

## 2018-01-31 MED ORDER — SODIUM CHLORIDE 0.9% FLUSH: 3.0000 mL | INTRAVENOUS | Status: DC | PRN

## 2018-01-31 MED ORDER — TICAGRELOR 90 MG PO TABS
ORAL_TABLET | ORAL | Status: AC
  Filled 2018-01-31: qty 2

## 2018-01-31 MED ORDER — NITROGLYCERIN IN D5W 200-5 MCG/ML-% IV SOLN
5.0000 ug/min | Freq: Once | INTRAVENOUS | Status: AC
  Administered 2018-01-31: 5 ug/min via INTRAVENOUS

## 2018-01-31 MED ORDER — FENTANYL CITRATE (PF) 100 MCG/2ML IJ SOLN
INTRAMUSCULAR | Status: AC
  Filled 2018-01-31: qty 2

## 2018-01-31 MED ORDER — HEPARIN SODIUM (PORCINE) 1000 UNIT/ML IJ SOLN
INTRAMUSCULAR | Status: DC | PRN
  Administered 2018-01-31: 4500 [IU] via INTRAVENOUS

## 2018-01-31 MED ORDER — HYDROMORPHONE HCL 1 MG/ML IJ SOLN
1.0000 mg | Freq: Once | INTRAMUSCULAR | Status: AC
  Administered 2018-01-31: 1 mg via INTRAVENOUS
  Filled 2018-01-31: qty 1

## 2018-01-31 MED ORDER — HEPARIN BOLUS VIA INFUSION
4000.0000 [IU] | Freq: Once | INTRAVENOUS | Status: AC
  Administered 2018-01-31: 4000 [IU] via INTRAVENOUS

## 2018-01-31 MED ORDER — ASPIRIN 81 MG PO CHEW
324.0000 mg | CHEWABLE_TABLET | Freq: Once | ORAL | Status: AC
  Administered 2018-01-31: 324 mg via ORAL
  Filled 2018-01-31: qty 4

## 2018-01-31 MED ORDER — ACETAMINOPHEN 325 MG PO TABS
650.0000 mg | ORAL_TABLET | ORAL | Status: DC | PRN
  Administered 2018-01-31: 650 mg via ORAL
  Filled 2018-01-31: qty 2

## 2018-01-31 MED ORDER — MORPHINE SULFATE (PF) 4 MG/ML IV SOLN
INTRAVENOUS | Status: AC
  Filled 2018-01-31: qty 1

## 2018-01-31 MED ORDER — ASPIRIN 81 MG PO CHEW
81.0000 mg | CHEWABLE_TABLET | Freq: Every day | ORAL | Status: DC
  Administered 2018-02-01 – 2018-02-02 (×2): 81 mg via ORAL
  Filled 2018-01-31 (×2): qty 1

## 2018-01-31 MED ORDER — LISINOPRIL 2.5 MG PO TABS: 2.5000 mg | ORAL_TABLET | Freq: Every day | ORAL | Status: DC

## 2018-01-31 MED ORDER — ONDANSETRON HCL 4 MG/2ML IJ SOLN: 4.0000 mg | Freq: Four times a day (QID) | INTRAMUSCULAR | Status: DC | PRN

## 2018-01-31 MED ORDER — METOPROLOL TARTRATE 12.5 MG HALF TABLET
12.5000 mg | ORAL_TABLET | Freq: Two times a day (BID) | ORAL | Status: DC
  Administered 2018-01-31: 12.5 mg via ORAL
  Filled 2018-01-31: qty 1

## 2018-01-31 MED ORDER — HYDRALAZINE HCL 20 MG/ML IJ SOLN: 5.0000 mg | INTRAMUSCULAR | Status: AC | PRN

## 2018-01-31 MED ORDER — SODIUM CHLORIDE 0.9 % IV SOLN
INTRAVENOUS | Status: AC | PRN
  Administered 2018-01-31 (×2): 1.75 mg/kg/h via INTRAVENOUS

## 2018-01-31 MED ORDER — CLOPIDOGREL BISULFATE 75 MG PO TABS: 75.0000 mg | ORAL_TABLET | Freq: Every day | ORAL | Status: DC

## 2018-01-31 MED ORDER — HEPARIN (PORCINE) IN NACL 1000-0.9 UT/500ML-% IV SOLN
INTRAVENOUS | Status: AC
  Filled 2018-01-31: qty 1000

## 2018-01-31 MED ORDER — PNEUMOCOCCAL VAC POLYVALENT 25 MCG/0.5ML IJ INJ
0.5000 mL | INJECTION | INTRAMUSCULAR | Status: AC
  Administered 2018-02-02: 0.5 mL via INTRAMUSCULAR
  Filled 2018-01-31: qty 0.5

## 2018-01-31 MED ORDER — FENTANYL CITRATE (PF) 100 MCG/2ML IJ SOLN
INTRAMUSCULAR | Status: DC | PRN
  Administered 2018-01-31: 25 ug via INTRAVENOUS

## 2018-01-31 MED ORDER — VERAPAMIL HCL 2.5 MG/ML IV SOLN
INTRAVENOUS | Status: AC
  Filled 2018-01-31: qty 2

## 2018-01-31 MED ORDER — BIVALIRUDIN BOLUS VIA INFUSION - CUPID
INTRAVENOUS | Status: DC | PRN
  Administered 2018-01-31: 67.05 mg via INTRAVENOUS

## 2018-01-31 MED ORDER — MORPHINE SULFATE (PF) 4 MG/ML IV SOLN
4.0000 mg | Freq: Once | INTRAVENOUS | Status: AC
  Administered 2018-01-31: 4 mg via INTRAVENOUS

## 2018-01-31 MED ORDER — ASPIRIN 81 MG PO CHEW
81.0000 mg | CHEWABLE_TABLET | Freq: Every day | ORAL | Status: DC
  Filled 2018-01-31: qty 1

## 2018-01-31 MED ORDER — SODIUM CHLORIDE 0.9% FLUSH
3.0000 mL | Freq: Two times a day (BID) | INTRAVENOUS | Status: DC
  Administered 2018-01-31 – 2018-02-01 (×2): 3 mL via INTRAVENOUS

## 2018-01-31 MED ORDER — SODIUM CHLORIDE 0.9 % WEIGHT BASED INFUSION: 1.0000 mL/kg/h | INTRAVENOUS | Status: DC

## 2018-01-31 MED ORDER — HEPARIN SODIUM (PORCINE) 1000 UNIT/ML IJ SOLN
INTRAMUSCULAR | Status: AC
  Filled 2018-01-31: qty 1

## 2018-01-31 MED ORDER — NITROGLYCERIN 1 MG/10 ML FOR IR/CATH LAB
INTRA_ARTERIAL | Status: AC
  Filled 2018-01-31: qty 10

## 2018-01-31 MED ORDER — LABETALOL HCL 5 MG/ML IV SOLN: 10.0000 mg | INTRAVENOUS | Status: AC | PRN

## 2018-01-31 MED ORDER — MORPHINE SULFATE (PF) 10 MG/ML IV SOLN: 2.0000 mg | INTRAVENOUS | Status: DC | PRN

## 2018-01-31 MED ORDER — SODIUM CHLORIDE 0.9 % IV SOLN
INTRAVENOUS | Status: DC
  Administered 2018-01-31: 16:00:00 via INTRAVENOUS

## 2018-01-31 SURGICAL SUPPLY — 19 items
BALLN SAPPHIRE 2.0X12 (BALLOONS) ×2
BALLN SAPPHIRE ~~LOC~~ 3.25X12 (BALLOONS) ×1 IMPLANT
BALLOON SAPPHIRE 2.0X12 (BALLOONS) IMPLANT
CATH INFINITI 5FR ANG PIGTAIL (CATHETERS) ×2 IMPLANT
CATH OPTITORQUE TIG 4.0 5F (CATHETERS) ×2 IMPLANT
CATH VISTA GUIDE 6FR XB3.5 (CATHETERS) ×2 IMPLANT
DEVICE RAD COMP TR BAND LRG (VASCULAR PRODUCTS) ×1 IMPLANT
GLIDESHEATH SLEND A-KIT 6F 22G (SHEATH) ×2 IMPLANT
GUIDEWIRE INQWIRE 1.5J.035X260 (WIRE) ×1 IMPLANT
INQWIRE 1.5J .035X260CM (WIRE) ×2
KIT ENCORE 26 ADVANTAGE (KITS) ×2 IMPLANT
KIT HEART LEFT (KITS) ×2 IMPLANT
PACK CARDIAC CATHETERIZATION (CUSTOM PROCEDURE TRAY) ×2 IMPLANT
STENT SYNERGY DES 3X16 (Permanent Stent) ×1 IMPLANT
SYR MEDRAD MARK V 150ML (SYRINGE) ×2 IMPLANT
TRANSDUCER W/STOPCOCK (MISCELLANEOUS) ×2 IMPLANT
TUBING CIL FLEX 10 FLL-RA (TUBING) ×2 IMPLANT
WIRE ASAHI PROWATER 180CM (WIRE) ×2 IMPLANT
WIRE HI TORQ VERSACORE-J 145CM (WIRE) ×2 IMPLANT

## 2018-01-31 NOTE — Interval H&P Note (Signed)
Cath Lab Visit (complete for each Cath Lab visit)  Clinical Evaluation Leading to the Procedure:   ACS: Yes.    Non-ACS:    Anginal Classification: CCS III  Anti-ischemic medical therapy: Minimal Therapy (1 class of medications)  Non-Invasive Test Results: No non-invasive testing performed  Prior CABG: No previous CABG      History and Physical Interval Note:  01/31/2018 11:12 AM  Rodney Mahoney  has presented today for surgery, with the diagnosis of cp  The various methods of treatment have been discussed with the patient and family. After consideration of risks, benefits and other options for treatment, the patient has consented to  Procedure(s): LEFT HEART CATH AND CORONARY ANGIOGRAPHY (N/A) as a surgical intervention .  The patient's history has been reviewed, patient examined, no change in status, stable for surgery.  I have reviewed the patient's chart and labs.  Questions were answered to the patient's satisfaction.     Nanetta BattyJonathan Berry

## 2018-01-31 NOTE — ED Notes (Signed)
CRITICAL VALUE ALERT  Critical Value:  Troponin 0.55  Date & Time Notied:  01/31/18 0800  Provider Notified: Yes   Orders Received/Actions taken: Heparin

## 2018-01-31 NOTE — H&P (Addendum)
Cardiology Admission History and Physical:   Patient ID: Rodney Mahoney; MRN: [id number]; DOB: Jan 21, 1961   Admission date: 01/31/2018  Primary Care Provider: Oval Linsey, MD Primary Cardiologist: Nona Dell, MD   Primary Electrophysiologist: N/A  Chief Complaint: Chest pain  Patient Profile:   Rodney Mahoney is a 57 y.o. male with a history of CAD that is post NSTEMI in 2016 with total distal RCA with minimal collaterals and 90% diagonal 1 with mild to moderate LV dysfunction treated medically.  2D echo in 2016 LVEF 50 to 55% Wall motion was normal.  Now presents to [facility] emergency room with stuttering chest pain and troponin of 0.55  History of Present Illness:   Rodney Mahoney is a 57 year old male patient with history of CAD status post NSTEMI 2016 with total distal RCA with minimal collaterals and 90% diagonal 1 treated medically.  Follow-up 2D echo LVEF 50 to 55% with normal wall motion.  Patient now complains of several month history of chest tightness with exertion associated with shortness of breath.  No radiation of pain.  Over the past week the pain has been almost daily coming and going.  Last night about 9 or 10:00 he could get no relief.  Now he is having some relief from nitroglycerin paste.  Troponin 0 0.55.  EKG with some anterior ST elevation as well as LVH.  Patient continues to smoke 1 pack of cigarettes today.  He is working a temporary job and was told he could have insurance but has no computer and does not know how to apply for it.  He also has a rotten tooth and thinks he might have an infection.  Father died of an MI at age 66.  Last saw Dr. Diona Browner 02/2017 at which time he was not having chest pain.  Has hypertension and hyperlipidemia.   Past Medical History:  Diagnosis Date  . Bronchitis   . CAD (coronary artery disease)    a. cath 11/26/2014 EF 45%, moderate to severe inferior hypokinesis, 90% small D1, 100% mid to distal RCA with minimal  collateral, 40% mid to distal LCx. Medical therapy as RCA occlusion appears to be completed event b. echo 11/27/2014 EF 50-55%  . Essential hypertension   . Hyperlipidemia   . MR (mitral regurgitation)    Mild by echo 11/27/2014  . NSTEMI (non-ST elevated myocardial infarction) (HCC)    11/26/14    Past Surgical History:  Procedure Laterality Date  . CARDIAC CATHETERIZATION N/A 11/26/2014   Procedure: Left Heart Cath and Coronary Angiography;  Surgeon: Peter M Swaziland, MD;  Location: [facility] INVASIVE CV LAB;  Service: Cardiovascular;  Laterality: N/A;  . COLONOSCOPY     per patient: done in [place], age 56, couple of polyps.  Marland Kitchen NECK SURGERY  2005     Medications Prior to Admission: Prior to Admission medications   Medication Sig Start Date End Date Taking? Authorizing Provider  aspirin 81 MG chewable tablet Chew 1 tablet (81 mg total) by mouth daily. 11/28/14   Azalee Course, PA  atorvastatin (LIPITOR) 80 MG tablet TAKE ONE TABLET BY MOUTH ONCE DAILY AT  6  PM 02/23/17   [name], MD  clopidogrel (PLAVIX) 75 MG tablet TAKE ONE TABLET BY MOUTH ONCE DAILY WITH BREAKFAST 02/23/17   [name], MD  lisinopril (PRINIVIL,ZESTRIL) 2.5 MG tablet Take 1 tablet (2.5 mg total) by mouth daily. 02/23/17   [name], MD  metoprolol tartrate (LOPRESSOR) 25 MG tablet Take 0.5  tablets (12.5 mg total) by mouth 2 (two) times daily. 02/23/17   [name], MD  nitroGLYCERIN (NITROSTAT) 0.4 MG SL tablet Place 1 tablet (0.4 mg total) under the tongue every 5 (five) minutes as needed for chest pain (up to 3 doses). 02/23/17   [name], MD     Allergies:   No Known Allergies  Social History:   Social History   Socioeconomic History  . Marital status: Married    Spouse name: Not on file  . Number of children: Not on file  . Years of education: Not on file  . Highest education level: Not on file  Occupational History  . Not on file  Social Needs  . Financial resource strain: Not  on file  . Food insecurity:    Worry: Not on file    Inability: Not on file  . Transportation needs:    Medical: Not on file    Non-medical: Not on file  Tobacco Use  . Smoking status: Current Every Day Smoker    Packs/day: 0.50    Types: Cigarettes  . Smokeless tobacco: Never Used  Substance and Sexual Activity  . Alcohol use: Yes    Alcohol/week: 0.0 standard drinks    Comment: 1-2 beers weekly  . Drug use: No  . Sexual activity: Yes    Birth control/protection: None  Lifestyle  . Physical activity:    Days per week: Not on file    Minutes per session: Not on file  . Stress: Not on file  Relationships  . Social connections:    Talks on phone: Not on file    Gets together: Not on file    Attends religious service: Not on file    Active member of club or organization: Not on file    Attends meetings of clubs or organizations: Not on file    Relationship status: Not on file  . Intimate partner violence:    Fear of current or ex partner: Not on file    Emotionally abused: Not on file    Physically abused: Not on file    Forced sexual activity: Not on file  Other Topics Concern  . Not on file  Social History Narrative  . Not on file    Family History:    The patient's family history includes Cancer in his sister; Heart attack in his father; Hypertension in his brother. There is no history of Colon cancer.    ROS:  Please see the history of present illness.  Review of Systems  Constitution: Negative.  HENT: Negative.        Rotten teeth  Cardiovascular: Positive for chest pain and dyspnea on exertion.  Respiratory: Positive for shortness of breath.   Endocrine: Negative.   Hematologic/Lymphatic: Negative.   Musculoskeletal: Negative.   Gastrointestinal: Negative.   Genitourinary: Negative.   Neurological: Negative.    All other ROS reviewed and negative.     Physical Exam/Data:   Vitals:   01/31/18 0657 01/31/18 0700 01/31/18 0730  BP:  (!) 145/95 (!)  151/79  Pulse:  72 65  Resp:  (!) 23 15  Temp:  98.2 F (36.8 C)   TempSrc:  Oral   SpO2:  100% 97%  Weight: 89.4 kg    Height: 5\' 6"  (1.676 m)     No intake or output data in the 24 hours ending 01/31/18 0838 Filed Weights   01/31/18 0657  Weight: 89.4 kg   Body mass index is  31.8 kg/m.  General:  Well nourished, well developed, in no acute distress  HEENT: normal Lymph: no adenopathy Neck: no  JVD Endocrine:  No thryomegaly Vascular: No carotid bruits; FA pulses 2+ bilaterally without bruits  Cardiac:  normal S1, S2; RRR; no murmur   Lungs: Decreased breath sounds but clear to auscultation bilaterally, no wheezing, rhonchi or rales  Abd: soft, nontender, no hepatomegaly  Ext: no  edema Musculoskeletal:  No deformities, BUE and BLE strength normal and equal Skin: warm and dry  Neuro:  CNs 2-12 intact, no focal abnormalities noted Psych:  Normal affect    EKG:  The ECG that was done was personally reviewed and demonstrates normal sinus rhythm with some anterior ST elevation and LVH   Relevant CV Studies:    Cardiac catheterization 2016   mid Cx to Dist Cx lesion, 40% stenosed.  Dist LAD lesion, 10% stenosed.  Mid RCA to Dist RCA lesion, 100% stenosed.  1st Diag lesion, 90% stenosed.  There is mild to moderate left ventricular systolic dysfunction.   1. 2 vessel obstructive CAD. There is a thrombotic occlusion of the distal RCA with minimal collaterals. There is a severe stenosis in a small side branch of the first diagonal. 2. Mild to moderate LV dysfunction     Echocardiogram 11/27/2014: Study Conclusions  - Left ventricle: The cavity size was normal. Systolic function was   normal. The estimated ejection fraction was in the range of 50%   to 55%. Wall motion was normal; there were no regional wall   motion abnormalities. Doppler parameters are consistent with   abnormal left ventricular relaxation (grade 1 diastolic   dysfunction). - Mitral valve:  There was mild regurgitation  2D echo 2016  Laboratory Data:  Chemistry Recent Labs  Lab 01/31/18 0736  NA 139  K 4.2  CL 105  CO2 25  GLUCOSE 111*  BUN 8  CREATININE 0.65  CALCIUM 9.2  GFRNONAA >60  GFRAA >60  ANIONGAP 9    No results for input(s): PROT, ALBUMIN, AST, ALT, ALKPHOS, BILITOT in the last 168 hours. Hematology Recent Labs  Lab 01/31/18 0736  WBC 8.0  RBC 4.54  HGB 13.8  HCT 41.4  MCV 91.2  MCH 30.4  MCHC 33.3  RDW 14.8  PLT 247   Cardiac EnzymesNo results for input(s): TROPONINI in the last 168 hours.  Recent Labs  Lab 01/31/18 0743  TROPIPOC 0.55*    BNPNo results for input(s): BNP, PROBNP in the last 168 hours.  DDimer No results for input(s): DDIMER in the last 168 hours.  Radiology/Studies:  Dg Chest 2 View  Result Date: 01/31/2018 CLINICAL DATA:  Chest pain EXAM: CHEST - 2 VIEW COMPARISON:  12/07/2014 chest radiograph. FINDINGS: Surgical hardware from ACDF overlies the lower cervical spine. Stable cardiomediastinal silhouette with normal heart size. No pneumothorax. No pleural effusion. Lungs appear clear, with no acute consolidative airspace disease and no pulmonary edema. IMPRESSION: No active cardiopulmonary disease. Electronically Signed   By: Delbert PhenixJason A Poff M.D.   On: 01/31/2018 08:23    Assessment and Plan:   NSTEMI with stuttering chest pain and troponin of 0.55.  Starting IV heparin, nitroglycerin paste, [facility] cardiac catheter[facility].I have reviewed the risks, indications, and alternatives to angioplasty and stenting with the patient. Risks include but are not limited to bleeding, infection, vascular injury, stroke, myocardial infection, arrhythmia, kidney injury, radiation-related injury in the case of prolonged fluoroscopy use, emergency cardiac surgery, and death. The patient understands  the risks of serious complication is low (<1%) and patient agrees to proceed.   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  Severity of Illness: The appropriate patient status for this patient is OBSERVATION. Observation status is judged to be reasonable and necessary in order to provide the required intensity of service to ensure the patient's safety. The patient's presenting symptoms, physical exam findings, and initial radiographic and laboratory data in the context of their medical condition is felt to place them at decreased risk for further clinical deterioration. Furthermore, it is anticipated that the patient will be medically stable for discharge from the hospital within 2 midnights of admission. The following factors support the patient status of observation.   " The patient's presenting symptoms include chest pain. " The physical exam findings include normal " The initial radiographic and laboratory data are positive troponins     For questions or updates, please contact [facility] Please consult [email or web address] for contact info under Cardiology/STEMI.    Signed, Jacolyn ReedyMichele Lenze, PA-C  01/31/2018 8:38 AM   The patient was seen and examined, and I agree with the history, physical exam, assessment and plan as documented above, with modifications as noted below. I have also personally reviewed all relevant documentation, old records, labs, and both radiographic and cardiovascular studies. I have also independently interpreted old and new ECG's.  Briefly, this is a 57 year old male with coronary disease and history of non-STEMI in 2016.  He underwent coronary angiography at that time which demonstrated a 90% small first diagonal lesion with 100% mid to distal RCA with minimal collaterals and 40% mid to distal left circumflex stenosis.  Medical therapy was  recommended as the RCA occlusion appeared to be a completed event.  Echocardiogram on 11/27/2014 demonstrated normal left ventricular systolic function and regional wall motion, LVEF 50 to 55%, grade 1 diastolic dysfunction, and mild mitral regurgitation.  He has been experiencing stuttering chest pain over the last week.  He presented to the ED where he was given nitroglycerin paste with some chest pain relief.  However, at the time of my evaluation he said the chest pain is recurring and now rates it at 8/10.  And nitroglycerin drip is about to be started.  Initial point-of-care troponin was elevated at 0.55.  I reviewed the ECG and there were no significant differences when compared to ECG performed on 02/23/2017 and the ECG on 11/15/2015 with sinus rhythm and LVH and nonspecific ST segment elevation in leads V1 and V2.  Chest x-ray is normal.  CBC and renal function are normal.  He continues to smoke.  He has a temporary job and does not have insurance because he does not on a computer and does not know how to apply for it.  He is on aspirin, high intensity atorvastatin 80 mg, Plavix, lisinopril, and metoprolol.  He is on IV heparin and IV nitroglycerin drip is about to be started.  He is hemodynamically stable.  Overall presentation consistent with non-STEMI.  [facility] to undergo coronary angiography.   Prentice DockerSuresh Koneswaran, MD, [facility] FoundationFACC  01/31/2018 9:31 AM

## 2018-01-31 NOTE — Progress Notes (Signed)
Pt LAC IV access site with oozing; site cleaned and redressed x2; blood pressure cuff relocated and pt. Is much happier with the L AC site. Ordered tray for patient because he does not like [place]; call to service response. Pt. Resting - report given to Chip,RN. Monitoring.

## 2018-01-31 NOTE — Progress Notes (Signed)
ANTICOAGULATION CONSULT NOTE - Initial Consult  Pharmacy Consult for Heparin Indication: chest pain/ACS  No Known Allergies  Patient Measurements: Height: 5\' 6"  (167.6 cm) Weight: 197 lb (89.4 kg) IBW/kg (Calculated) : 63.8 HEPARIN DW (KG): 82.6   Vital Signs: Temp: 98.2 F (36.8 C) (08/19 0700) Temp Source: Oral (08/19 0700) BP: 151/79 (08/19 0730) Pulse Rate: 65 (08/19 0730)  Labs: Recent Labs    01/31/18 0736  HGB 13.8  HCT 41.4  PLT 247    CrCl cannot be calculated (Patient's most recent lab result is older than the maximum 21 days allowed.).   Medical History: Past Medical History:  Diagnosis Date  . Bronchitis   . CAD (coronary artery disease)    a. cath 11/26/2014 EF 45%, moderate to severe inferior hypokinesis, 90% small D1, 100% mid to distal RCA with minimal collateral, 40% mid to distal LCx. Medical therapy as RCA occlusion appears to be completed event b. echo 11/27/2014 EF 50-55%  . Essential hypertension   . Hyperlipidemia   . MR (mitral regurgitation)    Mild by echo 11/27/2014  . NSTEMI (non-ST elevated myocardial infarction) (HCC)    11/26/14    Medications:  See med rec  Assessment: 57 yo male presented to ED with intermittent chest pain. Troponin elevated. Pharmacy asked to start heparin  Goal of Therapy:  Heparin level 0.3-0.7 units/ml Monitor platelets by anticoagulation protocol: Yes   Plan:  Give 4000 units bolus x 1 Start heparin infusion at 1000 units/hr Check anti-Xa level in 6-8 hours and daily while on heparin Continue to monitor H&H and platelets  Elder CyphersLorie Romeo Zielinski, BS Loura BackPharm D, BCPS Clinical Pharmacist Pager [phone] 01/31/2018,8:16 AM

## 2018-01-31 NOTE — ED Provider Notes (Signed)
[facility] Emergency Department Provider Note MRN:  [id number]  Arrival date & time: 01/31/18     Chief Complaint   Chest Pain   History of Present Illness   Rodney Mahoney is a 57 y.o. year-old male with a history of CAD, MI presenting to the ED with chief complaint of chest pain.  Chest pain began suddenly 1 week ago, has been intermittent since that time.  Sometimes exertional, but sometimes occurring at rest.  Described as a pressure/tightness in the center of the chest, nonradiating.  Associated with shortness of breath.  Denies dizziness or sweatiness, no nausea or vomiting.  Feels similar to his prior heart attack.  Improved with nitro at home last night, but nitro did not help this morning.  Denies fevers or cough, no abdominal pain, no leg pain or swelling.  Review of Systems  A complete 10 system review of systems was obtained and all systems are negative except as noted in the HPI and PMH.   Patient's Health History    Past Medical History:  Diagnosis Date  . Bronchitis   . CAD (coronary artery disease)    a. cath 11/26/2014 EF 45%, moderate to severe inferior hypokinesis, 90% small D1, 100% mid to distal RCA with minimal collateral, 40% mid to distal LCx. Medical therapy as RCA occlusion appears to be completed event b. echo 11/27/2014 EF 50-55%  . Essential hypertension   . Hyperlipidemia   . MR (mitral regurgitation)    Mild by echo 11/27/2014  . NSTEMI (non-ST elevated myocardial infarction) (HCC)    11/26/14    Past Surgical History:  Procedure Laterality Date  . CARDIAC CATHETERIZATION N/A 11/26/2014   Procedure: Left Heart Cath and Coronary Angiography;  Surgeon: Peter M Swaziland, MD;  Location: [facility] INVASIVE CV LAB;  Service: Cardiovascular;  Laterality: N/A;  . COLONOSCOPY     per patient: done in [place], age 35, couple of polyps.  Marland Kitchen NECK SURGERY  2005    Family History  Problem Relation Age of Onset  . Heart attack Father   . Cancer Sister     . Hypertension Brother   . Colon cancer Neg Hx     Social History   Socioeconomic History  . Marital status: Married    Spouse name: Not on file  . Number of children: Not on file  . Years of education: Not on file  . Highest education level: Not on file  Occupational History  . Not on file  Social Needs  . Financial resource strain: Not on file  . Food insecurity:    Worry: Not on file    Inability: Not on file  . Transportation needs:    Medical: Not on file    Non-medical: Not on file  Tobacco Use  . Smoking status: Current Every Day Smoker    Packs/day: 0.50    Types: Cigarettes  . Smokeless tobacco: Never Used  Substance and Sexual Activity  . Alcohol use: Yes    Alcohol/week: 0.0 standard drinks    Comment: 1-2 beers weekly  . Drug use: No  . Sexual activity: Yes    Birth control/protection: None  Lifestyle  . Physical activity:    Days per week: Not on file    Minutes per session: Not on file  . Stress: Not on file  Relationships  . Social connections:    Talks on phone: Not on file    Gets together: Not on file    Attends  religious service: Not on file    Active member of club or organization: Not on file    Attends meetings of clubs or organizations: Not on file    Relationship status: Not on file  . Intimate partner violence:    Fear of current or ex partner: Not on file    Emotionally abused: Not on file    Physically abused: Not on file    Forced sexual activity: Not on file  Other Topics Concern  . Not on file  Social History Narrative  . Not on file     Physical Exam  Vital Signs and Nursing Notes reviewed Vitals:   01/31/18 0845 01/31/18 0903  BP: 118/80 115/77  Pulse: 63 60  Resp: 11 16  Temp:    SpO2: 97% 96%    CONSTITUTIONAL: Well-appearing, NAD NEURO:  Alert and oriented x 3, no focal deficits EYES:  eyes equal and reactive ENT/NECK:  no LAD, no JVD CARDIO: Regular rate, well-perfused, normal S1 and S2 PULM:  CTAB no  wheezing or rhonchi GI/GU:  normal bowel sounds, non-distended, non-tender MSK/SPINE:  No gross deformities, no edema SKIN:  no rash, atraumatic PSYCH:  Appropriate speech and behavior  Diagnostic and Interventional Summary    EKG Interpretation  Date/Time:  Monday January 31 2018 06:57:02 EDT Ventricular Rate:  68 PR Interval:    QRS Duration: 113 QT Interval:  415 QTC Calculation: 442 R Axis:   26 Text Interpretation:  Sinus rhythm Abnormal R-wave progression, early transition Probable LVH with secondary repol abnrm Anterior ST elevation, probably due to LVH Confirmed by Kennis CarinaBero, Michael [phone]) on 01/31/2018 7:17:28 AM      Labs Reviewed  BASIC METABOLIC PANEL - Abnormal; Notable for the following components:      Result Value   Glucose, Bld 111 (*)    All other components within normal limits  I-STAT TROPONIN, ED - Abnormal; Notable for the following components:   Troponin i, poc 0.55 (*)    All other components within normal limits  CBC  HEPARIN LEVEL (UNFRACTIONATED)    DG Chest 2 View  Final Result      Medications  heparin bolus via infusion 4,000 Units (4,000 Units Intravenous Bolus from Bag 01/31/18 0848)    Followed by  heparin ADULT infusion 100 units/mL (25000 units/28450mL sodium chloride 0.45%) (1,000 Units/hr Intravenous New Bag/Given 01/31/18 0846)  aspirin chewable tablet 81 mg (has no administration in time range)  atorvastatin (LIPITOR) tablet 80 mg (has no administration in time range)  clopidogrel (PLAVIX) tablet 75 mg (has no administration in time range)  lisinopril (PRINIVIL,ZESTRIL) tablet 2.5 mg (has no administration in time range)  metoprolol tartrate (LOPRESSOR) tablet 12.5 mg (has no administration in time range)  nitroGLYCERIN 50 mg in dextrose 5 % 250 mL (0.2 mg/mL) infusion (has no administration in time range)  aspirin chewable tablet 324 mg (324 mg Oral Given 01/31/18 0731)  nitroGLYCERIN (NITROGLYN) 2 % ointment 1 inch (1 inch Topical Given  01/31/18 0731)  HYDROmorphone (DILAUDID) injection 1 mg (1 mg Intravenous Given 01/31/18 0732)     Procedures Critical Care Critical Care Documentation Critical care time provided by me (excluding procedures): 40 minutes  Condition necessitating critical care: Non-ST elevation myocardial infarction  Components of critical care management: reviewing of prior records, laboratory and imaging interpretation, frequent re-examination and reassessment of vital signs, administration of IV heparin, IV nitroglycerin, discussion with consultants.    ED Course and Medical Decision Making  I have reviewed the  triage vital signs and the nursing notes.  Pertinent labs & imaging results that were available during my care of the patient were reviewed by me and considered in my medical decision making (see below for details). Clinical Course as of Feb 01 927  Mon Jan 31, 2018  [id number] Concern for unstable angina in this 57 year old male with a history of MI, chest pain today similar to prior MI.  Vital signs stable, EKG with some new ST elevations V1 and V2, no reciprocal change, will repeat to ensure no dynamic changes here today.  Labs pending.  Nothing to suggest PE or dissection today.  Favoring admission for ischemic evaluation.   [MB]    Clinical Course User Index [MB] Sabas SousBero, Michael M, MD    First troponin 0.55 concerning for non-ST elevation myocardial infarction.  Repeat EKG with no significant changes to prior.  Cardiology has accepted the patient for admission, to be [facility] for [facility] catheter[facility].  Given IV heparin and nitroglycerin here in the ED.  Elmer SowMichael M. Pilar PlateBero, MD [facility] [email or web address]  Final Clinical Impressions(s) / ED Diagnoses     ICD-10-CM   1. NSTEMI (non-ST elevated myocardial infarction) [facility](HCC) I21.4     ED Discharge Orders    None         Sabas SousBero, Michael M, MD 01/31/18 [phone]

## 2018-01-31 NOTE — ED Notes (Signed)
[facility] starting drip and here to pick up pt

## 2018-01-31 NOTE — ED Notes (Signed)
[facility] left with pt 

## 2018-01-31 NOTE — ED Triage Notes (Signed)
Patient complaining of intermittent chest pain x 1 week, starting again last night.

## 2018-02-01 ENCOUNTER — Other Ambulatory Visit (HOSPITAL_COMMUNITY): Payer: Self-pay

## 2018-02-01 LAB — BASIC METABOLIC PANEL
Anion gap: 7 (ref 5–15)
BUN: 7 mg/dL (ref 6–20)
CALCIUM: 9 mg/dL (ref 8.9–10.3)
CO2: 24 mmol/L (ref 22–32)
Chloride: 107 mmol/L (ref 98–111)
Creatinine, Ser: 0.71 mg/dL (ref 0.61–1.24)
GFR calc Af Amer: >60 mL/min (ref >60)
GLUCOSE: 100 mg/dL — AB (ref 70–99)
Potassium: 3.8 mmol/L (ref 3.5–5.1)
SODIUM: 138 mmol/L (ref 135–145)

## 2018-02-01 LAB — CBC
HCT: 39.8 % (ref 39.0–52.0)
Hemoglobin: 13.2 g/dL (ref 13.0–17.0)
MCH: 30 pg (ref 26.0–34.0)
MCHC: 33.2 g/dL (ref 30.0–36.0)
MCV: 90.5 fL (ref 78.0–100.0)
PLATELETS: 232 10*3/uL (ref 150–400)
RBC: 4.4 MIL/uL (ref 4.22–5.81)
RDW: 14.8 % (ref 11.5–15.5)
WBC: 12.1 10*3/uL — AB (ref 4.0–10.5)

## 2018-02-01 LAB — PROTIME-INR
INR: 1.12
PROTHROMBIN TIME: 14.3 s (ref 11.4–15.2)

## 2018-02-01 LAB — TROPONIN I: Troponin I: >65 ng/mL (ref <0.03)

## 2018-02-01 MED ORDER — LORATADINE 10 MG PO TABS
10.0000 mg | ORAL_TABLET | Freq: Every day | ORAL | Status: DC
  Administered 2018-02-01 – 2018-02-02 (×2): 10 mg via ORAL
  Filled 2018-02-01 (×2): qty 1

## 2018-02-01 MED ORDER — ENOXAPARIN SODIUM 40 MG/0.4ML ~~LOC~~ SOLN
40.0000 mg | SUBCUTANEOUS | Status: DC
  Administered 2018-02-01: 40 mg via SUBCUTANEOUS
  Filled 2018-02-01: qty 0.4

## 2018-02-01 MED ORDER — METOPROLOL TARTRATE 25 MG PO TABS
25.0000 mg | ORAL_TABLET | Freq: Two times a day (BID) | ORAL | Status: DC
  Administered 2018-02-01 – 2018-02-02 (×3): 25 mg via ORAL
  Filled 2018-02-01 (×3): qty 1

## 2018-02-01 MED ORDER — LISINOPRIL 5 MG PO TABS
5.0000 mg | ORAL_TABLET | Freq: Every day | ORAL | Status: DC
  Administered 2018-02-01 – 2018-02-02 (×2): 5 mg via ORAL
  Filled 2018-02-01 (×2): qty 1

## 2018-02-01 MED FILL — Verapamil HCl IV Soln 2.5 MG/ML: INTRAVENOUS | Qty: 2 | Status: AC

## 2018-02-01 MED FILL — Nitroglycerin IV Soln 100 MCG/ML in D5W: INTRA_ARTERIAL | Qty: 10 | Status: AC

## 2018-02-01 NOTE — Progress Notes (Signed)
Progress Note  Patient Name: Rodney AngerRobert D Mahoney Date of Encounter: 02/01/2018  Primary Cardiologist: Nona DellSamuel McDowell, MD   Subjective   Feels well today. No chest pain or dyspnea.  Inpatient Medications    Scheduled Meds: . aspirin  81 mg Oral Daily  . aspirin  81 mg Oral Daily  . atorvastatin  80 mg Oral q1800  . enoxaparin  40 mg Subcutaneous Q24H  . lisinopril  5 mg Oral Daily  . loratadine  10 mg Oral Daily  . metoprolol tartrate  25 mg Oral BID  . pneumococcal 23 valent vaccine  0.5 mL Intramuscular Tomorrow-1000  . sodium chloride flush  3 mL Intravenous Q12H  . ticagrelor  90 mg Oral BID   Continuous Infusions: . sodium chloride     PRN Meds: sodium chloride, acetaminophen, morphine injection, ondansetron (ZOFRAN) IV, sodium chloride flush   Vital Signs    Vitals:   02/01/18 0500 02/01/18 0600 02/01/18 0605 02/01/18 0803  BP: (!) 120/91  (!) 135/93 (!) 146/98  Pulse: (!) 58 (!) 44  78  Resp: 11 (!) 26 (!) 28 17  Temp:    99.1 F (37.3 C)  TempSrc:    Oral  SpO2: 100% 94%  100%  Weight:      Height:        Intake/Output Summary (Last 24 hours) at 02/01/2018 0820 Last data filed at 02/01/2018 0600 Gross per 24 hour  Intake 671.52 ml  Output 2500 ml  Net -1828.48 ml   Filed Weights   01/31/18 0657  Weight: 89.4 kg    Telemetry    NSR rare PVC - Personally Reviewed  ECG    NSR old inferior infarct.  - Personally Reviewed  Physical Exam   GEN: No acute distress.   Neck: No JVD Cardiac: RRR, no murmurs, rubs, or gallops.  Respiratory: Clear to auscultation bilaterally. GI: Soft, nontender, non-distended  MS: No edema; No deformity. Neuro:  Nonfocal  Psych: Normal affect   Labs    Chemistry Recent Labs  Lab 01/31/18 0736 02/01/18 0043  NA 139 138  K 4.2 3.8  CL 105 107  CO2 25 24  GLUCOSE 111* 100*  BUN 8 7  CREATININE 0.65 0.71  CALCIUM 9.2 9.0  GFRNONAA >60 >60  GFRAA >60 >60  ANIONGAP 9 7     Hematology Recent Labs   Lab 01/31/18 0736 02/01/18 0043  WBC 8.0 12.1*  RBC 4.54 4.40  HGB 13.8 13.2  HCT 41.4 39.8  MCV 91.2 90.5  MCH 30.4 30.0  MCHC 33.3 33.2  RDW 14.8 14.8  PLT 247 232    Cardiac Enzymes Recent Labs  Lab 01/31/18 1835 02/01/18 0043  TROPONINI >65.00* 82.83*    Recent Labs  Lab 01/31/18 0743  TROPIPOC 0.55*     BNPNo results for input(s): BNP, PROBNP in the last 168 hours.   DDimer No results for input(s): DDIMER in the last 168 hours.   Radiology    Dg Chest 2 View  Result Date: 01/31/2018 CLINICAL DATA:  Chest pain EXAM: CHEST - 2 VIEW COMPARISON:  12/07/2014 chest radiograph. FINDINGS: Surgical hardware from ACDF overlies the lower cervical spine. Stable cardiomediastinal silhouette with normal heart size. No pneumothorax. No pleural effusion. Lungs appear clear, with no acute consolidative airspace disease and no pulmonary edema. IMPRESSION: No active cardiopulmonary disease. Electronically Signed   By: Delbert PhenixJason A Poff M.D.   On: 01/31/2018 08:23    Cardiac Studies   Procedures   CORONARY  STENT INTERVENTION  LEFT HEART CATH AND CORONARY ANGIOGRAPHY  Conclusion     Mid RCA lesion is 100% stenosed.  Prox Cx to Mid Cx lesion is 100% stenosed.  Ost 1st Diag lesion is 75% stenosed.  A drug-eluting stent was successfully placed.  Post intervention, there is a 0% residual stenosis.  There is moderate to severe left ventricular systolic dysfunction.  LV end diastolic pressure is mildly elevated.  The left ventricular ejection fraction is 35-45% by visual estimate.       Patient Profile     57 y.o. male with history of prior inferior infarct in 2016 presents with NSTEMI and found to have acutely occluded LCx.  Assessment & Plan    1. NSTEMI. Secondary to occluded LCX. S/p DES. 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 Troponin peak 82. Ecg without serial changes. EF 3e5-40% by cath. Await Echo results. On DAPT with ASA and  Brilinta. Cost is a major issue since patient does not have insurance and has been taking medications sporadically to spread them out. Should get 30 days of Brilinta free. Will look into community resources with case management. May have to switch to plavix later for cost. On high dose statin. Will increase lisinopril and metoprolol dose today. Transfer to telemetry and ambulate.  2. Acute on chronic systolic CHF. Await Echo. Dose not appear volume overloaded. Adjust meds as above.  3. HTN. BP elevated will increase meds as noted. 4. Hypercholesterolemia. On high dose statin. 5. Tobacco abuse. Counsel on smoking cessation.  For questions or updates, please contact [facility] Please consult [email or web address] for contact info under Cardiology/STEMI.      Signed, Semisi Biela Swazil[place]Jordan, MD  02/01/2018, 8:20 AM

## 2018-02-01 NOTE — Progress Notes (Signed)
Critical lab received, troponin greater than 65, verified by read back with lab. Doctor not notified d/t this being an expected result following this patient's NSTEMI, LHC, and stent placement

## 2018-02-01 NOTE — Care Management (Signed)
CM received consult for medication assistance.  CM was unable to secure follow [facility] nor , pt is not in zipcode area for [employer] nor [facility].  CM provided both [facility] and [facility] information on AVS.  CM informed attending group of the barrier with securing PCP, the need for cards to follow pt and provide samples until pt is approved for assitance .  CM will provide free 30 day Brilinta card, CM encouraged pt to contact [employer] ASAP to apply for assistance.

## 2018-02-01 NOTE — Plan of Care (Signed)
  Problem: Education: Goal: Knowledge of General Education information will improve Description: Including pain rating scale, medication(s)/side effects and non-pharmacologic comfort measures Outcome: Progressing   Problem: Clinical Measurements: Goal: Ability to maintain clinical measurements within normal limits will improve Outcome: Progressing Goal: Respiratory complications will improve Outcome: Progressing   

## 2018-02-01 NOTE — Progress Notes (Addendum)
CARDIAC REHAB PHASE I   Pt has been ambulating in hallway independently. Pt educated on importance of Brilinta, ASA, statin, and NTG. Stent card and MI booklet at bedside. Pt given heart healthy diet. Reviewed restrictions and exercise guidelines. Pt educated on need for smoking cessation, tip sheet and fake cigarette. Pt has concerns about affording medication. Will refer to CRP II .  [id number] Reynold Boweneresa  Lawrie Tunks, RN BSN 02/01/2018 1:51 PM

## 2018-02-01 NOTE — Progress Notes (Signed)
Critical lab received, troponin greater than 65 verified by read back with lab. Physician not notified d/t this being an expected result following a NSTEMI, LHC, and stent placement.

## 2018-02-02 ENCOUNTER — Inpatient Hospital Stay (HOSPITAL_COMMUNITY): Payer: Self-pay

## 2018-02-02 ENCOUNTER — Encounter (HOSPITAL_COMMUNITY): Payer: Self-pay | Admitting: Cardiology

## 2018-02-02 DIAGNOSIS — E782 Mixed hyperlipidemia: Secondary | ICD-10-CM

## 2018-02-02 DIAGNOSIS — I10 Essential (primary) hypertension: Secondary | ICD-10-CM

## 2018-02-02 DIAGNOSIS — I5021 Acute systolic (congestive) heart failure: Secondary | ICD-10-CM

## 2018-02-02 DIAGNOSIS — Z720 Tobacco use: Secondary | ICD-10-CM

## 2018-02-02 DIAGNOSIS — I5022 Chronic systolic (congestive) heart failure: Secondary | ICD-10-CM

## 2018-02-02 DIAGNOSIS — I340 Nonrheumatic mitral (valve) insufficiency: Secondary | ICD-10-CM

## 2018-02-02 LAB — PROTIME-INR
INR: 1.16
PROTHROMBIN TIME: 14.7 s (ref 11.4–15.2)

## 2018-02-02 LAB — CBC
HEMATOCRIT: 41 % (ref 39.0–52.0)
HEMOGLOBIN: 13.5 g/dL (ref 13.0–17.0)
MCH: 29.5 pg (ref 26.0–34.0)
MCHC: 32.9 g/dL (ref 30.0–36.0)
MCV: 89.5 fL (ref 78.0–100.0)
Platelets: 257 10*3/uL (ref 150–400)
RBC: 4.58 MIL/uL (ref 4.22–5.81)
RDW: 14.6 % (ref 11.5–15.5)
WBC: 10.9 10*3/uL — ABNORMAL HIGH (ref 4.0–10.5)

## 2018-02-02 LAB — ECHOCARDIOGRAM COMPLETE
Height: 66 in
Weight: 2992 oz

## 2018-02-02 MED ORDER — NITROGLYCERIN 0.4 MG SL SUBL: 0.4000 mg | SUBLINGUAL_TABLET | SUBLINGUAL | 0 refills | Status: AC | PRN

## 2018-02-02 MED ORDER — LISINOPRIL 5 MG PO TABS: 5.0000 mg | ORAL_TABLET | Freq: Every day | ORAL | 0 refills | Status: DC

## 2018-02-02 MED ORDER — TICAGRELOR 90 MG PO TABS: 90.0000 mg | ORAL_TABLET | Freq: Two times a day (BID) | ORAL | 0 refills | Status: DC

## 2018-02-02 MED ORDER — ATORVASTATIN CALCIUM 80 MG PO TABS: 80.0000 mg | ORAL_TABLET | Freq: Every day | ORAL | 0 refills | Status: DC

## 2018-02-02 MED ORDER — METOPROLOL TARTRATE 25 MG PO TABS: 25.0000 mg | ORAL_TABLET | Freq: Two times a day (BID) | ORAL | 0 refills | Status: DC

## 2018-02-02 NOTE — Progress Notes (Signed)
CARDIAC REHAB PHASE I   Pt ambulating in hallway upon arrival. Joined pt for +6 laps.Pt encouraged to slow down in hallway. Pts pace significantly decreased when pt was talking. Stressed importance of medication adherence. CM working with pt on cost. Pt verbalizes understanding of need for medications. Pts biggest concerns today are discharge and when he will be able to go back to work.   [id number] Rodney Mahoney  Rodney Odor, RN BSN 02/02/2018 10:37 AM

## 2018-02-02 NOTE — Care Management Note (Signed)
Case Management Note  Patient Details  Name: Rodney Mahoney MRN: [id number] Date of Birth: 05/17/1961  Subjective/Objective:   57 yo male with h/o CAD status post NSTEMI 2016 with total distal RCA with minimal collaterals and 90% diagonal 1 treated medically presented with several month history of chest tightness with exertion and SOB. Patient lives at home, independent and ambulates without difficulty.                 Action/Plan: CM met with patient to discuss post hospitalization transitional needs. Patient has no local PCP, CM arranged a f/u appt. at [facility] Department 02/25/18 @ 0830, and medication assist f/u with Lynn Ito, with information documented on AVS. CM discussed med assist, with patient verbalized being unemployed, with no ability to afford Rx at DC. CM will [place] patient for ordered Rx. CM will sign off.   Expected Discharge Date:                  Expected Discharge Plan:  Home/Self Care  In-House Referral:  NA  Discharge planning Services  CM Consult, Follow-up appt scheduled, [facility] Program, Medication Assistance  Post Acute Care Choice:  NA Choice offered to:  NA  DME Arranged:  N/A DME Agency:  NA  HH Arranged:  NA HH Agency:  NA  Status of Service:  Completed, signed off  If discussed at [place] of Stay Meetings, dates discussed:    Additional Comments:  Georgeanna Lea, RNNatalie Lilliauna Van RN, BSN, NCM-BC, ACM-RN [phone] 02/02/2018, 10:44 AM

## 2018-02-02 NOTE — Progress Notes (Signed)
  Echocardiogram 2D Echocardiogram has been performed.  Brynlea Spindler L Androw 02/02/2018, 10:19 AM

## 2018-02-02 NOTE — Discharge Summary (Addendum)
Discharge Summary    Patient ID: Rodney Mahoney,  MRN: [id number], DOB/AGE: Jun 17, 1960 57 y.o.  Admit date: 01/31/2018 Discharge date: 02/02/2018  Primary Care Provider: Oval Linseyondiego, Richard Primary Cardiologist: Dr. Diona BrownerMcDowell  Discharge Diagnoses    Principal Problem:   Non-STEMI (non-ST elevated myocardial infarction) [facility](HCC) Active Problems:   NSTEMI (non-ST elevated myocardial infarction) [facility](HCC)   Essential hypertension   Hyperlipidemia   Tobacco use   Acute systolic heart failure (HCC)   Allergies No Known Allergies  Diagnostic Studies/Procedures    Cath: 01/31/18   Mid RCA lesion is 100% stenosed.  Prox Cx to Mid Cx lesion is 100% stenosed.  Ost 1st Diag lesion is 75% stenosed.  A drug-eluting stent was successfully placed.  Post intervention, there is a 0% residual stenosis.  There is moderate to severe left ventricular systolic dysfunction.  LV end diastolic pressure is mildly elevated.  The left ventricular ejection fraction is 35-45% by visual estimate.  IMPRESSION: Successful PCI and drug-eluting stenting of an occluded mid nondominant left circumflex in the setting of a "non-STEMI".  He previously had an occluded RCA demonstrated by cath 3 years ago by Dr. SwazilandJordan without collaterals and this was treated medically.  He now has grade 3 collaterals to his distal right coronary artery with moderately severe LV dysfunction.  He will need uninterrupted dual antibody therapy for at least 12 months as well as optimization of his pharmacology for LV dysfunction.  Tobacco cessation will be emphasized.  The sheath was removed and a TR band was placed on the right wrist to achieve patent hemostasis.  The patient left the lab in stable condition.  Angiomax will continue full dose for 4 hours.  Nanetta BattyJonathan Berry. MD, [facility]  TTE: 02/02/18  Study Conclusions  - Left ventricle: The cavity size was mildly dilated. Systolic   function was moderately reduced. The  estimated ejection fraction   was in the range of 35% to 40%. There is akinesis and aneurysmal   deformity of the basal-midinferolateral, inferior, and   inferoseptal myocardium. Doppler parameters are consistent with   abnormal left ventricular relaxation (grade 1 diastolic   dysfunction). - Mitral valve: Mildly thickened leaflets . There was mild   regurgitation. - Left atrium: The atrium was mildly dilated.  Impressions:  - When compared to prior EF is reduced. _____________   History of Present Illness     Rodney Mahoney is a 57 year old male patient with history of CAD status post NSTEMI 2016 with total distal RCA with minimal collaterals and 90% diagonal 1 treated medically. Follow-up 2D echo LVEF 50 to 55% with normal wall motion.  Patient presented complaining of several month history of chest tightness with exertion associated with shortness of breath.  No radiation of pain.  Over the past week prior to admission the pain had been almost daily coming and going. The night about 9 or 10:00 he could get no relief. In the ED his troponin was 0.55.  EKG with some anterior ST elevation as well as LVH.  Patient continued to smoke 1 pack of cigarettes today.  He was working a temporary job and was told he could have insurance but had no computer and did not know how to apply for it. Father died of an MI at age 57. Last saw Dr. Diona BrownerMcDowell 02/2017 at which time he was not having chest pain.  Has hypertension and hyperlipidemia. Given his symptoms and labs, [facility]  Hospital Course     Underwent cath noted above with occluded LCX. S/p DES. 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 Troponin peak >65. Ecg without serial changes. EF 35-40% by cath. He was placed on DAPT with ASA/Brilinta post cath for at least one year. Also added metoprolol and lisinopril with blood pressures tolerating. Placed on high statin. Follow up echo showed EF of  35-40% with akinesis and aneurysmal deformity of the basal-midinferolateral, inferior, and inferoseptal myocardium. He was also counseled on the need for smoking cessation. Stressed the need for medication compliance and that he should not return to work until after his follow up appt in the office.   General: Well developed, well nourished, male appearing in no acute distress. Head: Normocephalic, atraumatic.  Neck: Supple without bruits, JVD. Lungs:  Resp regular and unlabored, CTA. Heart: RRR, S1, S2, no S3, S4, or murmur; no rub. Abdomen: Soft, non-tender, non-distended with normoactive bowel sounds. No hepatomegaly. No rebound/guarding. No obvious abdominal masses. Extremities: No clubbing, cyanosis, edema. Distal pedal pulses are 2+ bilaterally. R radial cath site stable without bruising or hematoma Neuro: Alert and oriented X 3. Moves all extremities spontaneously. Psych: Normal affect.  Rodney Anger was seen by Dr. Swaziland and determined stable for discharge home. Follow up in the office has been arranged. Medications are listed below.   _____________  Discharge Vitals Blood pressure 117/74, pulse 69, temperature 98 F (36.7 C), temperature source Oral, resp. rate 18, height 5\' 6"  (1.676 m), weight 84.8 kg, SpO2 98 %.  Filed Weights   01/31/18 0657 02/02/18 0537  Weight: 89.4 kg 84.8 kg    Labs & Radiologic Studies    CBC Recent Labs    02/01/18 0043 02/02/18 0350  WBC 12.1* 10.9*  HGB 13.2 13.5  HCT 39.8 41.0  MCV 90.5 89.5  PLT 232 257   Basic Metabolic Panel Recent Labs    95/28/41 0736 02/01/18 0043  NA 139 138  K 4.2 3.8  CL 105 107  CO2 25 24  GLUCOSE 111* 100*  BUN 8 7  CREATININE 0.65 0.71  CALCIUM 9.2 9.0   Liver Function Tests No results for input(s): AST, ALT, ALKPHOS, BILITOT, PROT, ALBUMIN in the last 72 hours. No results for input(s): LIPASE, AMYLASE in the last 72 hours. Cardiac Enzymes Recent Labs    01/31/18 1835 02/01/18 0043    TROPONINI >65.00* >65.00*   BNP Invalid input(s): POCBNP D-Dimer No results for input(s): DDIMER in the last 72 hours. Hemoglobin A1C No results for input(s): HGBA1C in the last 72 hours. Fasting Lipid Panel No results for input(s): CHOL, HDL, LDLCALC, TRIG, CHOLHDL, LDLDIRECT in the last 72 hours. Thyroid Function Tests No results for input(s): TSH, T4TOTAL, T3FREE, THYROIDAB in the last 72 hours.  Invalid input(s): FREET3 _____________  Dg Chest 2 View  Result Date: 01/31/2018 CLINICAL DATA:  Chest pain EXAM: CHEST - 2 VIEW COMPARISON:  12/07/2014 chest radiograph. FINDINGS: Surgical hardware from ACDF overlies the lower cervical spine. Stable cardiomediastinal silhouette with normal heart size. No pneumothorax. No pleural effusion. Lungs appear clear, with no acute consolidative airspace disease and no pulmonary edema. IMPRESSION: No active cardiopulmonary disease. Electronically Signed   By: Delbert Phenix M.D.   On: 01/31/2018 08:23   Disposition   Pt is being discharged home today in good condition.  Follow-up Plans & Appointments    Follow-up Information    Health, [facility]. Go on 02/25/2018.   Why:  at  0830. Arrive 15 minutes prior to appointment. Bring your indentification, proof of household income, list of medications & hospital discharge paperwork. Will receive a call or text to confirm appointment & meet with Meriam SpragueJanet Barrett for medication assist Contact information: [address] [place] [address] [phone]        Jodelle GrossLawrence, Kathryn M, NP Follow up on 02/15/2018.   Specialties:  Nurse Practitioner, Radiology, Cardiology Why:  at 9:30am for your follow up appt.  Contact information: [address] [place] [address] [phone]          Discharge Instructions    AMB Referral to Cardiac Rehabilitation - Phase II   Complete by:  As directed    Diagnosis:  NSTEMI   Amb Referral to Cardiac Rehabilitation   Complete by:  As  directed    Diagnosis:   Coronary Stents NSTEMI     Diet - low sodium heart healthy   Complete by:  As directed    Discharge instructions   Complete by:  As directed    Radial Site Care Refer to this sheet in the next few weeks. These instructions provide you with information on caring for yourself after your procedure. Your caregiver may also give you more specific instructions. Your treatment has been planned according to current medical practices, but problems sometimes occur. Call your caregiver if you have any problems or questions after your procedure. HOME CARE INSTRUCTIONS You may shower the day after the procedure.Remove the bandage (dressing) and gently wash the site with plain soap and water.Gently pat the site dry.  Do not apply powder or lotion to the site.  Do not submerge the affected site in water for 3 to 5 days.  Inspect the site at least twice daily.  Do not flex or bend the affected arm for 24 hours.  No lifting over 5 pounds (2.3 kg) for 5 days after your procedure.  Do not drive home if you are discharged the same day of the procedure. Have someone else drive you.  You may drive 24 hours after the procedure unless otherwise instructed by your caregiver.  What to expect: Any bruising will usually fade within 1 to 2 weeks.  Blood that collects in the tissue (hematoma) may be painful to the touch. It should usually decrease in size and tenderness within 1 to 2 weeks.  SEEK IMMEDIATE MEDICAL CARE IF: You have unusual pain at the radial site.  You have redness, warmth, swelling, or pain at the radial site.  You have drainage (other than a small amount of blood on the dressing).  You have chills.  You have a fever or persistent symptoms for more than 72 hours.  You have a fever and your symptoms suddenly get worse.  Your arm becomes pale, cool, tingly, or numb.  You have heavy bleeding from the site. Hold pressure on the site.   PLEASE DO NOT MISS ANY DOSES OF YOUR  BRILINTA!!!!! Also keep a log of you blood pressures and bring back to your follow up appt. Please call the office with any questions.   Patients taking blood thinners should generally stay away from medicines like ibuprofen, Advil, Motrin, naproxen, and Aleve due to risk of stomach bleeding. You may take Tylenol as directed or talk to your primary doctor about alternatives.  If you do not have enough of your Brilinta to last until your health department appt. Please call the office to get samples. DO NOT RUN OUT OF BRILINTA!!!  Increase activity slowly   Complete by:  As directed        Discharge Medications     Medication List    STOP taking these medications   clopidogrel 75 MG tablet Commonly known as:  PLAVIX     TAKE these medications   aspirin 81 MG chewable tablet Chew 1 tablet (81 mg total) by mouth daily.   atorvastatin 80 MG tablet Commonly known as:  LIPITOR Take 1 tablet (80 mg total) by mouth daily at 6 PM.   lisinopril 5 MG tablet Commonly known as:  PRINIVIL,ZESTRIL Take 1 tablet (5 mg total) by mouth daily. Start taking on:  02/03/2018 What changed:    medication strength  how much to take   metoprolol tartrate 25 MG tablet Commonly known as:  LOPRESSOR Take 1 tablet (25 mg total) by mouth 2 (two) times daily. What changed:  how much to take   nitroGLYCERIN 0.4 MG SL tablet Commonly known as:  NITROSTAT Place 1 tablet (0.4 mg total) under the tongue every 5 (five) minutes as needed. What changed:  reasons to take this   ticagrelor 90 MG Tabs tablet Commonly known as:  BRILINTA Take 1 tablet (90 mg total) by mouth 2 (two) times daily.        Acute coronary syndrome (MI, NSTEMI, STEMI, etc) this admission?: Yes.     AHA/ACC Clinical Performance & Quality Measures: 1. Aspirin prescribed? - Yes 2. ADP Receptor Inhibitor (Plavix/Clopidogrel, Brilinta/Ticagrelor or Effient/Prasugrel) prescribed (includes medically managed patients)? -  Yes 3. Beta Blocker prescribed? - Yes 4. High Intensity Statin (Lipitor 40-80mg  or Crestor 20-40mg ) prescribed? - Yes 5. EF assessed during THIS hospitalization? - Yes 6. For EF <40%, was ACEI/ARB prescribed? - Yes 7. For EF <40%, Aldosterone Antagonist (Spironolactone or Eplerenone) prescribed? - No - Reason:  consider as outpatient.  8. Cardiac Rehab Phase II ordered (Included Medically managed Patients)? - Yes      Outstanding Labs/Studies   FLP/LFTs in 6 weeks.   Duration of Discharge Encounter   Greater than 30 minutes including physician time.  Signed, Mohammad Granade NP-C 02/02/2018, 12:51 PM Patient seen and examined and history reviewed. Agree with above findings and plan. Patient feels well and is ambulating independently without angina or dyspnea. VSS. Lungs are clear. No gallop or murmur  In NSR on telemetry  Stressed importance of smoking cessation and medical compliance. He is stable for DC today with plans as noted above.  Peter Swaz[place], MDFACC 02/02/2018 5:26 PM

## 2018-02-10 NOTE — Progress Notes (Signed)
Cardiology Office Note   Date:  02/15/2018   ID:  Rodney Mahoney, DOB 09-08-1960, MRN [id number]  PCP:  Oval Linseyondiego, Richard, MD  Cardiologist:  Dr. Diona BrownerMcDowell Chief Complaint  Patient presents with  . Hospitalization Follow-up  . Coronary Artery Disease     History of Present Illness: Rodney Mahoney is a 57 y.o. male who presents for post hospital follow up after admission for NSTEMI, with other history of  HTN, ongoing tobacco abuse, and acute systolic heart failure.   She underwent cardiac cath by Dr. Allyson SabalBerry revealing occluded LCX, S/P DES, she had chronic occlusion of the distal RCA with left to right collaterals. EF was found to be 35%-40% by cardiac cath. She was placed on DAPT with ASA and Brilinta, post cath for one year. She was advised on smoking cessation.   He is without cardiac complaints today. He is complaining of ED symptoms since starting heart medications. He denies chest pain, bleeding or DOE. He unfortunately continues to smoke. He requests Chantix for help. He denies hx of seizures or psychiatric abnormalities.   Past Medical History:  Diagnosis Date  . Bronchitis   . CAD (coronary artery disease)    a. cath 11/26/2014 EF 45%, moderate to severe inferior hypokinesis, 90% small D1, 100% mid to distal RCA with minimal collateral, 40% mid to distal LCx. Medical therapy as RCA occlusion appears to be completed event b. echo 11/27/2014 EF 50-55%  . Essential hypertension   . Hyperlipidemia   . MR (mitral regurgitation)    Mild by echo 11/27/2014  . NSTEMI (non-ST elevated myocardial infarction) (HCC)    11/26/14, 01/31/18 PCI/DES to mLCx, EF 35-40%    Past Surgical History:  Procedure Laterality Date  . CARDIAC CATHETERIZATION N/A 11/26/2014   Procedure: Left Heart Cath and Coronary Angiography;  Surgeon: Rodney M SwazilandJordan, MD;  [facility];  Service: Cardiovascular;  Laterality: N/A;  . COLONOSCOPY     per patient: done in [place], age 57, couple of  polyps.  . CORONARY STENT INTERVENTION N/A 01/31/2018   Procedure: CORONARY STENT INTERVENTION;  Surgeon: Rodney Mahoney, Rodney J, MD;  Location: [facility] INVASIVE CV LAB;  Service: Cardiovascular;  Laterality: N/A;  . LEFT HEART CATH AND CORONARY ANGIOGRAPHY N/A 01/31/2018   Procedure: LEFT HEART CATH AND CORONARY ANGIOGRAPHY;  Surgeon: Rodney Mahoney, Rodney J, MD;  Location: [facility] INVASIVE CV LAB;  Service: Cardiovascular;  Laterality: N/A;  . NECK SURGERY  2005     Current Outpatient Medications  Medication Sig Dispense Refill  . aspirin 81 MG chewable tablet Chew 1 tablet (81 mg total) by mouth daily.    Marland Kitchen. atorvastatin (LIPITOR) 80 MG tablet Take 1 tablet (80 mg total) by mouth daily at 6 PM. 90 tablet 1  . lisinopril (PRINIVIL,ZESTRIL) 5 MG tablet Take 1 tablet (5 mg total) by mouth daily. 90 tablet 1  . nitroGLYCERIN (NITROSTAT) 0.4 MG SL tablet Place 1 tablet (0.4 mg total) under the tongue every 5 (five) minutes as needed. 25 tablet 0  . ticagrelor (BRILINTA) 90 MG TABS tablet Take 1 tablet (90 mg total) by mouth 2 (two) times daily. 180 tablet 1  . metoprolol succinate (TOPROL XL) 25 MG 24 hr tablet Take 1 tablet (25 mg total) by mouth daily. 90 tablet 0  . varenicline (CHANTIX CONTINUING MONTH PAK) 1 MG tablet Take 1 tablet (1 mg total) by mouth 2 (two) times daily. 90 tablet 1  . varenicline (CHANTIX STARTING MONTH PAK) 0.5 MG X 11 &  1 MG X 42 tablet Take 1 0.5 mg tab by mouth QD for 3 days, then increase to 1TAB-0.5 mg tab BID for 4 days, then increase to one 1 mg tab BID. 53 tablet 0   No current facility-administered medications for this visit.     Allergies:   Patient has no known allergies.    Social History:  The patient  reports that he has been smoking cigarettes. He has been smoking about 0.50 packs per day. He has never used smokeless tobacco. He reports that he drinks alcohol. He reports that he does not use drugs.   Family History:  The patient's family history includes Cancer in his  sister; Heart attack in his father; Hypertension in his brother.    ROS: All other systems are reviewed and negative. Unless otherwise mentioned in H&P    PHYSICAL EXAM: VS:  BP 114/70 (BP Location: Left Arm, Patient Position: Sitting, Cuff Size: Normal)   Pulse (!) 59   Ht 5\' 6"  (1.676 m)   Wt 195 lb 3.2 oz (88.5 kg)   BMI 31.51 kg/m  , BMI Body mass index is 31.51 kg/m. GEN: Well nourished, well developed, in no acute distress Smells of cigarettes.  HEENT: normal  Neck: no JVD, carotid bruits, or masses Cardiac: RRR; no murmurs, rubs, or gallops,no edema  Respiratory:  clear to auscultation bilaterally, normal work of breathing GI: soft, nontender, nondistended, + BS MS: no deformity or atrophy Right wrist catheter insertion site is well healed.  Skin: warm and dry, no rash Neuro:  Strength and sensation are intact Psych: euthymic mood, full affect   EKG:  NSR with LVH, prior inferior infarct. HR of 59 bpm.   Recent Labs: 02/01/2018: BUN 7; Creatinine, Ser 0.71; Potassium 3.8; Sodium 138 02/02/2018: Hemoglobin 13.5; Platelets 257    Lipid Panel No results found for: CHOL, TRIG, HDL, CHOLHDL, VLDL, LDLCALC, LDLDIRECT    Wt Readings from Last 3 Encounters:  02/15/18 195 lb 3.2 oz (88.5 kg)  02/02/18 187 lb (84.8 kg)  02/23/17 196 lb (88.9 kg)      Other studies Reviewed: Cath: 01/31/18   Mid RCA lesion is 100% stenosed.  Prox Cx to Mid Cx lesion is 100% stenosed.  Ost 1st Diag lesion is 75% stenosed.  A drug-eluting stent was successfully placed.  Post intervention, there is a 0% residual stenosis.  There is moderate to severe left ventricular systolic dysfunction.  LV end diastolic pressure is mildly elevated.  The left ventricular ejection fraction is 35-45% by visual estimate.  IMPRESSION:Successful PCI and drug-eluting stenting of an occluded mid nondominant left circumflex in the setting of a "non-STEMI". He previously had an occluded RCA  demonstrated by cath 3 years ago by Dr. Swaziland without collaterals and this was treated medically. He now has grade 3 collaterals to his distal right coronary artery with moderately severe LV dysfunction. He will need uninterrupted dual antibody therapy for at least 12 months as well as optimization of his pharmacology for LV dysfunction.  ASSESSMENT AND PLAN:  1. CAD: Multivessel CAD with new stent to Cx. He has residual disease elsewhere with collaterals. He is reinforced on lifestyle changes and heart healthy. He verbalizes understanding. 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 I have given him a letter to return to work without restrictions.   2. Hypertension: Continue ACE. Well controlled.  3. Hypercholesterolemia: Continue atorvastatin, he will have repeat labs completed in 3 months.   4. Tobacco abuse: He  is given Rx for Chantix. He is to follow smoking cessation program.   5. ED: Complains of this since starting cardiac medications. Culprit likely metoprolol. I will reduce the dose to 25 mg and change to succinate to be taken at HS. Hopefully lower long acting dose will help to correct ED.    Current medicines are reviewed at length with the patient today.    Labs/ tests ordered today include: Lipids and  LFT, BMET, CBC in 3 months. Rodney Mahoney. Rodney Mahoney, ANP, AACC   02/15/2018 10:12 AM    [facility] [address] [place], [place] [address] Phone: [phone]; Fax: [phone]

## 2018-02-15 ENCOUNTER — Encounter: Payer: Self-pay | Admitting: Adult Health

## 2018-02-15 ENCOUNTER — Ambulatory Visit (INDEPENDENT_AMBULATORY_CARE_PROVIDER_SITE_OTHER): Payer: Self-pay | Admitting: Adult Health

## 2018-02-15 VITALS — BP 114/70 | HR 59 | Ht 66.0 in | Wt 195.2 lb

## 2018-02-15 DIAGNOSIS — Z720 Tobacco use: Secondary | ICD-10-CM

## 2018-02-15 DIAGNOSIS — Z79899 Other long term (current) drug therapy: Secondary | ICD-10-CM

## 2018-02-15 DIAGNOSIS — I5021 Acute systolic (congestive) heart failure: Secondary | ICD-10-CM

## 2018-02-15 DIAGNOSIS — E7800 Pure hypercholesterolemia, unspecified: Secondary | ICD-10-CM

## 2018-02-15 DIAGNOSIS — I10 Essential (primary) hypertension: Secondary | ICD-10-CM

## 2018-02-15 DIAGNOSIS — I2510 Atherosclerotic heart disease of native coronary artery without angina pectoris: Secondary | ICD-10-CM

## 2018-02-15 MED ORDER — LISINOPRIL 5 MG PO TABS: 5.0000 mg | ORAL_TABLET | Freq: Every day | ORAL | 1 refills | Status: DC

## 2018-02-15 MED ORDER — METOPROLOL SUCCINATE ER 25 MG PO TB24: 25.0000 mg | ORAL_TABLET | Freq: Every day | ORAL | 0 refills | Status: DC

## 2018-02-15 MED ORDER — VARENICLINE TARTRATE 0.5 MG X 11 & 1 MG X 42 PO MISC: ORAL | 0 refills | Status: DC

## 2018-02-15 MED ORDER — TICAGRELOR 90 MG PO TABS: 90.0000 mg | ORAL_TABLET | Freq: Two times a day (BID) | ORAL | 1 refills | Status: DC

## 2018-02-15 MED ORDER — ATORVASTATIN CALCIUM 80 MG PO TABS: 80.0000 mg | ORAL_TABLET | Freq: Every day | ORAL | 1 refills | Status: DC

## 2018-02-15 MED ORDER — VARENICLINE TARTRATE 1 MG PO TABS: 1.0000 mg | ORAL_TABLET | Freq: Two times a day (BID) | ORAL | 1 refills | Status: DC

## 2018-02-15 NOTE — Patient Instructions (Signed)
Medication Instructions:  START CHANTIX FOR TOBACCO ABUSE  STOP METOPROLOL TARTRATE  START METOPROLOL SUCCINATE 25MG  DAILY  If you need a refill on your cardiac medications before your next appointment, please call your pharmacy.  Labwork: A1C,BMET,CBC,FLP AND LFT 1 WEEK BEFORE HERE IN OUR OFFICE AT [facility]  Take the provided lab slips with you to the lab for your blood draw.   You will need to fast. DO NOT EAT OR DRINK PAST MIDNIGHT.   Special Instructions: MAY RETURN TO WORK/FULL DUTY 02-21-2018  Follow-Up: Your physician wants you to follow-up in: 3 MONTHS WITH KATHRYN LAWRENCE (NURSE PRACTIONIER), [facility]     Thank you for choosing [facility] at [facility]!!

## 2018-02-27 ENCOUNTER — Other Ambulatory Visit: Payer: Self-pay | Admitting: Cardiology

## 2018-02-28 ENCOUNTER — Other Ambulatory Visit: Payer: Self-pay | Admitting: *Deleted

## 2018-03-15 ENCOUNTER — Other Ambulatory Visit: Payer: Self-pay | Admitting: Cardiology

## 2019-02-06 ENCOUNTER — Other Ambulatory Visit: Payer: Self-pay | Admitting: Adult Health

## 2019-02-22 ENCOUNTER — Telehealth: Payer: Self-pay | Admitting: *Deleted

## 2019-02-22 NOTE — Progress Notes (Signed)
Error. No show.

## 2019-02-22 NOTE — Telephone Encounter (Signed)
Virtual Visit Pre-Appointment Phone Call  "(Name), I am calling you today to discuss your upcoming appointment. We are currently trying to limit exposure to the virus that causes COVID-19 by seeing patients at home rather than in the office."  1. "What is the BEST phone number to call the day of the visit?" - include this in appointment notes  2. "Do you have or have access to (through a family member/friend) a smartphone with video capability that we can use for your visit?" a. If yes - list this number in appt notes as "cell" (if different from BEST phone #) and list the appointment type as a VIDEO visit in appointment notes b. If no - list the appointment type as a PHONE visit in appointment notes  3. Confirm consent - "In the setting of the current Covid19 crisis, you are scheduled for a (phone or video) visit with your provider on (date) at (time).  Just as we do with many in-office visits, in order for you to participate in this visit, we must obtain consent.  If you'd like, I can send this to your mychart (if signed up) or email for you to review.  Otherwise, I can obtain your verbal consent now.  All virtual visits are billed to your insurance company just like a normal visit would be.  By agreeing to a virtual visit, we'd like you to understand that the technology does not allow for your provider to perform an examination, and thus may limit your provider's ability to fully assess your condition. If your provider identifies any concerns that need to be evaluated in person, we will make arrangements to do so.  Finally, though the technology is pretty good, we cannot assure that it will always work on either your or our end, and in the setting of a video visit, we may have to convert it to a phone-only visit.  In either situation, we cannot ensure that we have a secure connection.  Are you willing to proceed?" STAFF: Did the patient verbally acknowledge consent to telehealth visit? Document  YES/NO here: YES  4. Advise patient to be prepared - "Two hours prior to your appointment, go ahead and check your blood pressure, pulse, oxygen saturation, and your weight (if you have the equipment to check those) and write them all down. When your visit starts, your provider will ask you for this information. If you have an Apple Watch or Kardia device, please plan to have heart rate information ready on the day of your appointment. Please have a pen and paper handy nearby the day of the visit as well."  5. Give patient instructions for MyChart download to smartphone OR [email or web address] as below if video visit (depending on what platform provider is using)  6. Inform patient they will receive a phone call 15 minutes prior to their appointment time (may be from unknown caller ID) so they should be prepared to answer    TELEPHONE CALL NOTE  JOLLY BLEICHER has been deemed a candidate for a follow-up tele-health visit to limit community exposure during the Covid-19 pandemic. I spoke with the patient via phone to ensure availability of phone/video source, confirm preferred email & phone number, and discuss instructions and expectations.  I reminded Rodney Mahoney to be prepared with any vital sign and/or heart rhythm information that could potentially be obtained via home monitoring, at the time of his visit. I reminded Rodney Mahoney to expect a phone call prior to  his visit.  Raelyn NumberWilliamson, John Williamsen L, CMA 02/22/2019 2:47 PM   INSTRUCTIONS FOR DOWNLOADING THE MYCHART APP TO SMARTPHONE  - The patient must first make sure to have activated MyChart and know their login information - If Apple, go to [employer] Store and type in MyChart in the search bar and download the app. If Android, ask patient to go to [employer] Play Store and type in [facility] in the search bar and download the app. The app is free but as with any other app downloads, their phone may require them to verify saved payment  information or Apple/Android password.  - The patient will need to then log into the app with their MyChart username and password, and select [facility] as their healthcare provider to link the account. When it is time for your visit, go to the MyChart app, find appointments, and click Begin Video Visit. Be sure to Select Allow for your device to access the Microphone and Camera for your visit. You will then be connected, and your provider will be with you shortly.  **If they have any issues connecting, or need assistance please contact MyChart service desk [phone])**  **If using a computer, in order to ensure the best quality for their visit they will need to use either of the following Internet Browsers: [employer] Edge, or Google Chrome**  IF USING DOXIMITY or DOXY.ME - The patient will receive a link just prior to their visit by text.     FULL LENGTH CONSENT FOR TELE-HEALTH VISIT   I hereby voluntarily request, consent and authorize [facility] and its employed or contracted physicians, physician assistants, nurse practitioners or other licensed health care professionals (the Practitioner), to provide me with telemedicine health care services (the "Services") as deemed necessary by the treating Practitioner. I acknowledge and consent to receive the Services by the Practitioner via telemedicine. I understand that the telemedicine visit will involve communicating with the Practitioner through live audiovisual communication technology and the disclosure of certain medical information by electronic transmission. I acknowledge that I have been given the opportunity to request an in-person assessment or other available alternative prior to the telemedicine visit and am voluntarily participating in the telemedicine visit.  I understand that I have the right to withhold or withdraw my consent to the use of telemedicine in the course of my care at any time, without affecting my right  to future care or treatment, and that the Practitioner or I may terminate the telemedicine visit at any time. I understand that I have the right to inspect all information obtained and/or recorded in the course of the telemedicine visit and may receive copies of available information for a reasonable fee.  I understand that some of the potential risks of receiving the Services via telemedicine include:  Marland Kitchen. Delay or interruption in medical evaluation due to technological equipment failure or disruption; . Information transmitted may not be sufficient (e.g. poor resolution of images) to allow for appropriate medical decision making by the Practitioner; and/or  . In rare instances, security protocols could fail, causing a breach of personal health information.  Furthermore, I acknowledge that it is my responsibility to provide information about my medical history, conditions and care that is complete and accurate to the best of my ability. I acknowledge that Practitioner's advice, recommendations, and/or decision may be based on factors not within their control, such as incomplete or inaccurate data provided by me or distortions of diagnostic images or specimens that may result from electronic transmissions.  I understand that the practice of medicine is not an exact science and that Practitioner makes no warranties or guarantees regarding treatment outcomes. I acknowledge that I will receive a copy of this consent concurrently upon execution via email to the email address I last provided but may also request a printed copy by calling the office of [place].    I understand that my insurance will be billed for this visit.   I have read or had this consent read to me. . I understand the contents of this consent, which adequately explains the benefits and risks of the Services being provided via telemedicine.  . I have been provided ample opportunity to ask questions regarding this consent and the Services  and have had my questions answered to my satisfaction. . I give my informed consent for the services to be provided through the use of telemedicine in my medical care  By participating in this telemedicine visit I agree to the above.

## 2019-02-23 ENCOUNTER — Encounter: Payer: Self-pay | Admitting: Adult Health

## 2019-03-05 NOTE — Progress Notes (Signed)
Virtual Visit via Telephone Note   This visit type was conducted due to national recommendations for restrictions regarding the COVID-19 Pandemic (e.g. social distancing) in an effort to limit this patient's exposure and mitigate transmission in our community.  Due to his co-morbid illnesses, this patient is at least at moderate risk for complications without adequate follow up.  This format is felt to be most appropriate for this patient at this time.  The patient did not have access to video technology/had technical difficulties with video requiring transitioning to audio format only (telephone).  All issues noted in this document were discussed and addressed.  No physical exam could be performed with this format.  Please refer to the patient's chart for his  consent to telehealth for [facility].   Date:  03/05/2019   ID:  Rodney Mahoney, DOB 30-May-1961, MRN [id number]  Patient Location: Home Provider Location: Home  PCP:  Oval Linsey, MD  Cardiologist:  Nona Dell, MD  Electrophysiologist:  None   Evaluation Performed:  Follow-Up Visit  Chief Complaint:  Needs medication refills.  History of Present Illness:    Rodney Mahoney is a 58 y.o. male we are following for ongoing assessment and and management of CAD, with NSTEMI in August of 2019.. Cardiac cath by Dr. Allyson Sabal revealing occluded LCX, S/P DES, he had chronic occlusion of the distal RCA with left to right collaterals. EF was found to be 35%-40% by cardiac cath. He was placed on DAPT with ASA and Brilinta, post cath for one year. He was advised on smoking cessation.   [name] was last seen on 02/15/2018. He was without complaint, and was still struggling with smoking cessation.   [name] has moved to [place] for a job opportunity, and has not been established with a primary care physician or a cardiologist.  He states that he will not return to [place] until after the first of the year.   He does not have refills on lisinopril or metoprolol.   The patient denies any recurrent chest discomfort, fatigue or significant DOE. He denies bleeding or excessive bruising.  He has occasional wheezing associated with smoking.  He has not yet quit.  He states he smokes at least a half a pack a day of cigarettes.  He has not had any recent labs or follow-up with any physician since being seen during his MI in August 2019.   The patient does not have symptoms concerning for COVID-19 infection (fever, chills, cough, or new shortness of breath).    Past Medical History:  Diagnosis Date   Bronchitis    CAD (coronary artery disease)    a. cath 11/26/2014 EF 45%, moderate to severe inferior hypokinesis, 90% small D1, 100% mid to distal RCA with minimal collateral, 40% mid to distal LCx. Medical therapy as RCA occlusion appears to be completed event b. echo 11/27/2014 EF 50-55%   Essential hypertension    Hyperlipidemia    MR (mitral regurgitation)    Mild by echo 11/27/2014   NSTEMI (non-ST elevated myocardial infarction) (HCC)    11/26/14, 01/31/18 PCI/DES to mLCx, EF 35-40%   Past Surgical History:  Procedure Laterality Date   CARDIAC CATHETERIZATION N/A 11/26/2014   Procedure: Left Heart Cath and Coronary Angiography;  Surgeon: Peter M Swaziland, MD;  Location: [facility] INVASIVE CV LAB;  Service: Cardiovascular;  Laterality: N/A;   COLONOSCOPY     per patient: done in [place], age 61, couple of polyps.   CORONARY STENT  INTERVENTION N/A 01/31/2018   Procedure: CORONARY STENT INTERVENTION;  Surgeon: Runell GessBerry, Jonathan J, MD;  [facility];  Service: Cardiovascular;  Laterality: N/A;   LEFT HEART CATH AND CORONARY ANGIOGRAPHY N/A 01/31/2018   Procedure: LEFT HEART CATH AND CORONARY ANGIOGRAPHY;  Surgeon: Runell GessBerry, Jonathan J, MD;  Location: [facility] INVASIVE CV LAB;  Service: Cardiovascular;  Laterality: N/A;   NECK SURGERY  2005     No outpatient medications have been marked as taking for the  03/06/19 encounter (Appointment) with Jodelle GrossLawrence, Izea Livolsi M, NP.     Allergies:   Patient has no known allergies.   Social History   Tobacco Use   Smoking status: Current Every Day Smoker    Packs/day: 0.50    Types: Cigarettes   Smokeless tobacco: Never Used  Substance Use Topics   Alcohol use: Yes    Alcohol/week: 0.0 standard drinks    Comment: 1-2 beers weekly   Drug use: No     Family Hx: The patient's family history includes Cancer in his sister; Heart attack in his father; Hypertension in his brother. There is no history of Colon cancer.  ROS:   Please see the history of present illness.    All other systems reviewed and are negative.   Prior CV studies:   The following studies were reviewed today: Cath: 01/31/18   Mid RCA lesion is 100% stenosed.  Prox Cx to Mid Cx lesion is 100% stenosed.  Ost 1st Diag lesion is 75% stenosed.  A drug-eluting stent was successfully placed.  Post intervention, there is a 0% residual stenosis.  There is moderate to severe left ventricular systolic dysfunction.  LV end diastolic pressure is mildly elevated.  The left ventricular ejection fraction is 35-45% by visual estimate.  IMPRESSION:Successful PCI and drug-eluting stenting of an occluded mid nondominant left circumflex in the setting of a "non-STEMI". He previously had an occluded RCA demonstrated by cath 3 years ago by Dr. SwazilandJordan without collaterals and this was treated medically. He now has grade 3 collaterals to his distal right coronary artery with moderately severe LV dysfunction. He will need uninterrupted dual antibody therapy for at least 12 months as well as optimization of his pharmacology for LV dysfunction.  Labs/Other Tests and Data Reviewed:    EKG:  No ECG reviewed.  Recent Labs: No results found for requested labs within last 8760 hours.   Recent Lipid Panel No results found for: CHOL, TRIG, HDL, CHOLHDL, LDLCALC, LDLDIRECT  Wt Readings from  Last 3 Encounters:  02/15/18 195 lb 3.2 oz (88.5 kg)  02/02/18 187 lb (84.8 kg)  02/23/17 196 lb (88.9 kg)     Objective:    Vital Signs:  There were no vitals taken for this visit.   VITAL SIGNS:  reviewed GEN:  no acute distress RESPIRATORY:  Breathing is labored with walking and talking while speaking to me on the phone. NEURO:  alert and oriented x 3, no obvious focal deficit PSYCH:  normal affect  ASSESSMENT & PLAN:    1.  Coronary artery disease: History of NSTEMI August 2019 with occluded left circumflex.  This was treated with a drug-eluting stent, with proximal to mid circumflex lesion 100% stenosis and mid RCA lesion 100% stenosed with ostial first diagonal lesion 75% stenosed.  The patient was also found to have moderate to severe LV systolic dysfunction with an EF of 35% to 45%.  He has not been seen by cardiology or primary care physician since August 2019.  He is now moved to [place].  He is strongly advised to get established with a cardiologist there for ongoing management of his coronary artery disease and ischemic cardiomyopathy.  He verbalizes understanding.  He is given refills on lisinopril and metoprolol.  He can stop Brilinta at this time as it is been greater than 1 year.  2.  Ischemic cardiomyopathy: Catheterization revealed an EF of 35% to 45% by visual estimate.  He has not followed up with PCP or cardiology since that hospitalization.  He appears short of breath talking to me on the phone.  He denies any lower extremity edema or weight gain.  He denies any palpitations or heart racing.  He will need to be established as soon as possible with a cardiologist in [place] so that further lab and follow-up testing can be completed to optimize his medication management.  He verbalizes understanding  3.  Hypertension: Uncertain of his current blood pressure.  He has run out of refills on lisinopril and metoprolol which is been provided for him as 90-day refill.   Again, I stressed the need to follow-up with a cardiologist and primary care physician now that he is moved to [place].  He will need to have labs to include a CMET.   4.  Hyperlipidemia: Currently on atorvastatin 80 mg daily.  He complains of myalgia pain.  He will need to have fasting lipids and LFTs once established with primary care and/or cardiologist.  5.  Ongoing tobacco abuse: High risk for lung disease and progression of CAD.  This is been explained to him.  He states he is down to half a pack a day.  He is no longer taking Chantix.  COVID-19 Education: The signs and symptoms of COVID-19 were discussed with the patient and how to seek care for testing (follow up with PCP or arrange E-visit).  The importance of social distancing was discussed today.  Time:   Today, I have spent 15 minutes with the patient with telehealth technology discussing the above problems.     Medication Adjustments/Labs and Tests Ordered: Current medicines are reviewed at length with the patient today.  Concerns regarding medicines are outlined above.  He is to become established with a cardiologist and PCP for refills on his medications.  He will also need to have lab work to assess his renal function, anemia, and lipid status.  He verbalizes understanding.  Tests Ordered: No orders of the defined types were placed in this encounter.   Medication Changes: No orders of the defined types were placed in this encounter.   Disposition:  Follow up 6 months if he returns to [facility].  Signed, Phill Myron. [place] Pugh, ANP, AACC  03/05/2019 11:05 AM    [facility]

## 2019-03-06 ENCOUNTER — Encounter: Payer: Self-pay | Admitting: Adult Health

## 2019-03-06 ENCOUNTER — Telehealth (INDEPENDENT_AMBULATORY_CARE_PROVIDER_SITE_OTHER): Payer: Self-pay | Admitting: Adult Health

## 2019-03-06 VITALS — Ht 66.0 in | Wt 205.0 lb

## 2019-03-06 DIAGNOSIS — Z720 Tobacco use: Secondary | ICD-10-CM

## 2019-03-06 DIAGNOSIS — I10 Essential (primary) hypertension: Secondary | ICD-10-CM

## 2019-03-06 DIAGNOSIS — I2510 Atherosclerotic heart disease of native coronary artery without angina pectoris: Secondary | ICD-10-CM

## 2019-03-06 DIAGNOSIS — E7800 Pure hypercholesterolemia, unspecified: Secondary | ICD-10-CM

## 2019-03-06 MED ORDER — METOPROLOL SUCCINATE ER 25 MG PO TB24: 25.0000 mg | ORAL_TABLET | Freq: Every day | ORAL | 3 refills | Status: DC

## 2019-03-06 MED ORDER — LISINOPRIL 5 MG PO TABS: 5.0000 mg | ORAL_TABLET | Freq: Every day | ORAL | 3 refills | Status: DC

## 2019-03-06 NOTE — Patient Instructions (Signed)
Medication Instructions:  Continue current medications  If you need a refill on your cardiac medications before your next appointment, please call your pharmacy.  Labwork: None Ordered   Testing/Procedures: None ordered  Follow-Up: Your physician recommends that you schedule a follow-up appointment in: As Needed  At [facility], you and your health needs are our priority.  As part of our continuing mission to provide you with exceptional heart care, we have created designated Provider Care Teams.  These Care Teams include your primary Cardiologist (physician) and Advanced Practice Providers (APPs -  Physician Assistants and Nurse Practitioners) who all work together to provide you with the care you need, when you need it.  Thank you for choosing [facility] at [facility]!!     

## 2019-05-18 ENCOUNTER — Other Ambulatory Visit: Payer: Self-pay | Admitting: Adult Health

## 2019-05-19 ENCOUNTER — Other Ambulatory Visit: Payer: Self-pay | Admitting: Adult Health

## 2019-05-22 ENCOUNTER — Other Ambulatory Visit: Payer: Self-pay | Admitting: Adult Health

## 2019-08-31 ENCOUNTER — Other Ambulatory Visit: Payer: Self-pay | Admitting: Cardiology

## 2020-05-01 DIAGNOSIS — I110 Hypertensive heart disease with heart failure: Secondary | ICD-10-CM | POA: Diagnosis not present

## 2020-06-04 DIAGNOSIS — E7849 Other hyperlipidemia: Secondary | ICD-10-CM | POA: Diagnosis not present

## 2020-06-04 DIAGNOSIS — I110 Hypertensive heart disease with heart failure: Secondary | ICD-10-CM | POA: Diagnosis not present

## 2020-07-08 ENCOUNTER — Emergency Department (HOSPITAL_COMMUNITY): Payer: BC Managed Care – PPO

## 2020-07-08 ENCOUNTER — Emergency Department (HOSPITAL_COMMUNITY)
Admission: EM | Admit: 2020-07-08 | Discharge: 2020-07-08 | Disposition: A | Discharge status: Home / Self Care | Payer: BC Managed Care – PPO | Attending: Emergency Medicine | Admitting: Emergency Medicine

## 2020-07-08 ENCOUNTER — Other Ambulatory Visit: Payer: Self-pay

## 2020-07-08 ENCOUNTER — Encounter (HOSPITAL_COMMUNITY): Payer: Self-pay | Admitting: *Deleted

## 2020-07-08 DIAGNOSIS — R0789 Other chest pain: Secondary | ICD-10-CM | POA: Diagnosis not present

## 2020-07-08 DIAGNOSIS — R197 Diarrhea, unspecified: Secondary | ICD-10-CM | POA: Diagnosis not present

## 2020-07-08 DIAGNOSIS — I10 Essential (primary) hypertension: Secondary | ICD-10-CM | POA: Diagnosis not present

## 2020-07-08 DIAGNOSIS — Z5321 Procedure and treatment not carried out due to patient leaving prior to being seen by health care provider: Secondary | ICD-10-CM | POA: Insufficient documentation

## 2020-07-08 DIAGNOSIS — R079 Chest pain, unspecified: Secondary | ICD-10-CM | POA: Diagnosis not present

## 2020-07-08 LAB — TYPE AND SCREEN
ABO/RH(D): O POS
Antibody Screen: NEGATIVE

## 2020-07-08 LAB — COMPREHENSIVE METABOLIC PANEL
ALT: 47 U/L — ABNORMAL HIGH (ref 0–44)
AST: 44 U/L — ABNORMAL HIGH (ref 15–41)
Albumin: 4.2 g/dL (ref 3.5–5.0)
Alkaline Phosphatase: 69 U/L (ref 38–126)
Anion gap: 12 (ref 5–15)
BUN: 10 mg/dL (ref 6–20)
CO2: 23 mmol/L (ref 22–32)
Calcium: 9 mg/dL (ref 8.9–10.3)
Chloride: 102 mmol/L (ref 98–111)
Creatinine, Ser: 0.82 mg/dL (ref 0.61–1.24)
GFR, Estimated: >60 mL/min (ref >60)
Glucose, Bld: 82 mg/dL (ref 70–99)
Potassium: 4.1 mmol/L (ref 3.5–5.1)
Sodium: 137 mmol/L (ref 135–145)
Total Bilirubin: 0.3 mg/dL (ref 0.3–1.2)
Total Protein: 7 g/dL (ref 6.5–8.1)

## 2020-07-08 LAB — CBC
HCT: 42.9 % (ref 39.0–52.0)
Hemoglobin: 14.1 g/dL (ref 13.0–17.0)
MCH: 30.9 pg (ref 26.0–34.0)
MCHC: 32.9 g/dL (ref 30.0–36.0)
MCV: 93.9 fL (ref 80.0–100.0)
Platelets: 273 10*3/uL (ref 150–400)
RBC: 4.57 MIL/uL (ref 4.22–5.81)
RDW: 14.6 % (ref 11.5–15.5)
WBC: 11.5 10*3/uL — ABNORMAL HIGH (ref 4.0–10.5)
nRBC: 0 % (ref 0.0–0.2)

## 2020-07-08 LAB — TROPONIN I (HIGH SENSITIVITY): Troponin I (High Sensitivity): 22 ng/L — ABNORMAL HIGH (ref <18)

## 2020-07-08 NOTE — ED Triage Notes (Signed)
Pt reports having diarrhea this am, reports stools were very dark. Pt take eliquis. Began having mild chest discomfort. No acute distress is noted at this time.

## 2020-07-08 NOTE — ED Notes (Signed)
Patient handed his patient labels to this NT and said "I dont feel that bad to wait that long. I will go to my doctor office tomorrow."

## 2020-07-09 DIAGNOSIS — K219 Gastro-esophageal reflux disease without esophagitis: Secondary | ICD-10-CM | POA: Diagnosis not present

## 2020-07-09 DIAGNOSIS — E7849 Other hyperlipidemia: Secondary | ICD-10-CM | POA: Diagnosis not present

## 2020-07-09 DIAGNOSIS — R5382 Chronic fatigue, unspecified: Secondary | ICD-10-CM | POA: Diagnosis not present

## 2020-07-09 DIAGNOSIS — I119 Hypertensive heart disease without heart failure: Secondary | ICD-10-CM | POA: Diagnosis not present

## 2020-10-14 DIAGNOSIS — R972 Elevated prostate specific antigen [PSA]: Secondary | ICD-10-CM | POA: Diagnosis not present

## 2020-10-14 DIAGNOSIS — I4891 Unspecified atrial fibrillation: Secondary | ICD-10-CM | POA: Diagnosis not present

## 2020-10-14 DIAGNOSIS — D510 Vitamin B12 deficiency anemia due to intrinsic factor deficiency: Secondary | ICD-10-CM | POA: Diagnosis not present

## 2020-10-14 DIAGNOSIS — I110 Hypertensive heart disease with heart failure: Secondary | ICD-10-CM | POA: Diagnosis not present

## 2020-10-14 DIAGNOSIS — E559 Vitamin D deficiency, unspecified: Secondary | ICD-10-CM | POA: Diagnosis not present

## 2020-10-14 DIAGNOSIS — E7849 Other hyperlipidemia: Secondary | ICD-10-CM | POA: Diagnosis not present

## 2020-10-21 DIAGNOSIS — R5382 Chronic fatigue, unspecified: Secondary | ICD-10-CM | POA: Diagnosis not present

## 2020-10-21 DIAGNOSIS — I4891 Unspecified atrial fibrillation: Secondary | ICD-10-CM | POA: Diagnosis not present

## 2020-10-21 DIAGNOSIS — E7849 Other hyperlipidemia: Secondary | ICD-10-CM | POA: Diagnosis not present

## 2020-11-04 DIAGNOSIS — Z7901 Long term (current) use of anticoagulants: Secondary | ICD-10-CM | POA: Diagnosis not present

## 2020-11-04 DIAGNOSIS — I119 Hypertensive heart disease without heart failure: Secondary | ICD-10-CM | POA: Diagnosis not present

## 2020-11-04 DIAGNOSIS — E7849 Other hyperlipidemia: Secondary | ICD-10-CM | POA: Diagnosis not present

## 2020-12-03 DIAGNOSIS — K219 Gastro-esophageal reflux disease without esophagitis: Secondary | ICD-10-CM | POA: Diagnosis not present

## 2020-12-03 DIAGNOSIS — Z7901 Long term (current) use of anticoagulants: Secondary | ICD-10-CM | POA: Diagnosis not present

## 2020-12-03 DIAGNOSIS — E7849 Other hyperlipidemia: Secondary | ICD-10-CM | POA: Diagnosis not present

## 2020-12-03 DIAGNOSIS — I119 Hypertensive heart disease without heart failure: Secondary | ICD-10-CM | POA: Diagnosis not present

## 2021-03-19 ENCOUNTER — Emergency Department (HOSPITAL_COMMUNITY)
Admission: EM | Admit: 2021-03-19 | Discharge: 2021-03-19 | Disposition: A | Discharge status: Home / Self Care | Payer: BC Managed Care – PPO | Attending: Emergency Medicine | Admitting: Emergency Medicine

## 2021-03-19 ENCOUNTER — Other Ambulatory Visit: Payer: Self-pay

## 2021-03-19 ENCOUNTER — Emergency Department (HOSPITAL_COMMUNITY): Payer: BC Managed Care – PPO

## 2021-03-19 ENCOUNTER — Encounter (HOSPITAL_COMMUNITY): Payer: Self-pay | Admitting: Emergency Medicine

## 2021-03-19 DIAGNOSIS — R002 Palpitations: Secondary | ICD-10-CM | POA: Diagnosis not present

## 2021-03-19 DIAGNOSIS — Z955 Presence of coronary angioplasty implant and graft: Secondary | ICD-10-CM | POA: Insufficient documentation

## 2021-03-19 DIAGNOSIS — Z79899 Other long term (current) drug therapy: Secondary | ICD-10-CM | POA: Diagnosis not present

## 2021-03-19 DIAGNOSIS — I2510 Atherosclerotic heart disease of native coronary artery without angina pectoris: Secondary | ICD-10-CM | POA: Diagnosis not present

## 2021-03-19 DIAGNOSIS — R0602 Shortness of breath: Secondary | ICD-10-CM | POA: Insufficient documentation

## 2021-03-19 DIAGNOSIS — R202 Paresthesia of skin: Secondary | ICD-10-CM | POA: Diagnosis not present

## 2021-03-19 DIAGNOSIS — I110 Hypertensive heart disease with heart failure: Secondary | ICD-10-CM | POA: Insufficient documentation

## 2021-03-19 DIAGNOSIS — I5021 Acute systolic (congestive) heart failure: Secondary | ICD-10-CM | POA: Diagnosis not present

## 2021-03-19 DIAGNOSIS — F17210 Nicotine dependence, cigarettes, uncomplicated: Secondary | ICD-10-CM | POA: Insufficient documentation

## 2021-03-19 DIAGNOSIS — R079 Chest pain, unspecified: Secondary | ICD-10-CM | POA: Diagnosis not present

## 2021-03-19 DIAGNOSIS — R0789 Other chest pain: Secondary | ICD-10-CM | POA: Diagnosis not present

## 2021-03-19 DIAGNOSIS — I517 Cardiomegaly: Secondary | ICD-10-CM | POA: Diagnosis not present

## 2021-03-19 LAB — CBC WITH DIFFERENTIAL/PLATELET
Abs Immature Granulocytes: 0.03 10*3/uL (ref 0.00–0.07)
Basophils Absolute: 0 10*3/uL (ref 0.0–0.1)
Basophils Relative: 1 %
Eosinophils Absolute: 0.2 10*3/uL (ref 0.0–0.5)
Eosinophils Relative: 3 %
HCT: 40.9 % (ref 39.0–52.0)
Hemoglobin: 13.7 g/dL (ref 13.0–17.0)
Immature Granulocytes: 0 %
Lymphocytes Relative: 43 %
Lymphs Abs: 3.3 10*3/uL (ref 0.7–4.0)
MCH: 31 pg (ref 26.0–34.0)
MCHC: 33.5 g/dL (ref 30.0–36.0)
MCV: 92.5 fL (ref 80.0–100.0)
Monocytes Absolute: 0.7 10*3/uL (ref 0.1–1.0)
Monocytes Relative: 9 %
Neutro Abs: 3.3 10*3/uL (ref 1.7–7.7)
Neutrophils Relative %: 44 %
Platelets: 231 10*3/uL (ref 150–400)
RBC: 4.42 MIL/uL (ref 4.22–5.81)
RDW: 14.4 % (ref 11.5–15.5)
WBC: 7.6 10*3/uL (ref 4.0–10.5)
nRBC: 0 % (ref 0.0–0.2)

## 2021-03-19 LAB — COMPREHENSIVE METABOLIC PANEL
ALT: 40 U/L (ref 0–44)
AST: 35 U/L (ref 15–41)
Albumin: 4.3 g/dL (ref 3.5–5.0)
Alkaline Phosphatase: 69 U/L (ref 38–126)
Anion gap: 7 (ref 5–15)
BUN: 12 mg/dL (ref 6–20)
CO2: 26 mmol/L (ref 22–32)
Calcium: 9.1 mg/dL (ref 8.9–10.3)
Chloride: 103 mmol/L (ref 98–111)
Creatinine, Ser: 0.79 mg/dL (ref 0.61–1.24)
GFR, Estimated: >60 mL/min (ref >60)
Glucose, Bld: 98 mg/dL (ref 70–99)
Potassium: 4.1 mmol/L (ref 3.5–5.1)
Sodium: 136 mmol/L (ref 135–145)
Total Bilirubin: 0.9 mg/dL (ref 0.3–1.2)
Total Protein: 7.2 g/dL (ref 6.5–8.1)

## 2021-03-19 LAB — BRAIN NATRIURETIC PEPTIDE: B Natriuretic Peptide: 46 pg/mL (ref 0.0–100.0)

## 2021-03-19 LAB — TROPONIN I (HIGH SENSITIVITY)
Troponin I (High Sensitivity): 11 ng/L (ref <18)
Troponin I (High Sensitivity): 9 ng/L (ref <18)

## 2021-03-19 LAB — PROTIME-INR
INR: 1.8 — ABNORMAL HIGH (ref 0.8–1.2)
Prothrombin Time: 21.2 seconds — ABNORMAL HIGH (ref 11.4–15.2)

## 2021-03-19 MED ORDER — ASPIRIN 325 MG PO TABS
325.0000 mg | ORAL_TABLET | Freq: Once | ORAL | Status: AC
  Administered 2021-03-19: 325 mg via ORAL
  Filled 2021-03-19: qty 1

## 2021-03-19 NOTE — Discharge Instructions (Addendum)
Schedule follow-up with your cardiology next week, make sure you state that you are returning patient to the will be able to see you sooner.  Continue taking your medicine as prescribed, return to the ED if things change or worsen.

## 2021-03-19 NOTE — ED Triage Notes (Signed)
Pt reports left sided chest pain that is worse with deep breathing and palpation. Pt reports taking 2 nitro. Denies chest pain at this time.

## 2021-03-19 NOTE — ED Notes (Signed)
Pt is unhappy about the wait, pt wanting to speak to the doctor, pt given snacks. Pt states "I'm starving, I can't wait all day". Pt encouraged to wait for results.

## 2021-03-19 NOTE — ED Provider Notes (Signed)
[facility] EMERGENCY DEPARTMENT Provider Note   CSN: [id number] Arrival date & time: 03/19/21  0973     History No chief complaint on file.   Rodney Mahoney is a 60 y.o. male.   Chest Pain Associated symptoms: numbness, palpitations and shortness of breath   Associated symptoms: no abdominal pain, no back pain, no cough, no fatigue, no fever, no nausea and no vomiting     Patient with history of CAD, previous NSTEMI, hyperlipidemia, hypertension presents with chest pain.  Chest pain started acutely while he was at work at roughly 8:45 AM.  It was sharp, on the left side of his chest and radiated to his left arm.  He felt his left arm went numb, he took a nitroglycerin which alleviated the pain.  At the onset he felt short of breath, inspiration made the pain worse.  Denied any nausea or vomiting.  Patient is a current day smoker, smokes 1 pack of cigarettes per day.  Reports last night when he went to bed he also felt like his heart was racing and felt short of breath for a few seconds.  It resolved without any intervention.  There is no associated chest pain at the time.  Patient has not had his Coumadin levels checked in the previous 90 days.  Has not seen a cardiologist since a telehealth visit in 2019.  Past Medical History:  Diagnosis Date   Bronchitis    CAD (coronary artery disease)    a. cath 11/26/2014 EF 45%, moderate to severe inferior hypokinesis, 90% small D1, 100% mid to distal RCA with minimal collateral, 40% mid to distal LCx. Medical therapy as RCA occlusion appears to be completed event b. echo 11/27/2014 EF 50-55%   Essential hypertension    Hyperlipidemia    MR (mitral regurgitation)    Mild by echo 11/27/2014   NSTEMI (non-ST elevated myocardial infarction) (HCC)    11/26/14, 01/31/18 PCI/DES to mLCx, EF 35-40%    Patient Active Problem List   Diagnosis Date Noted   Tobacco use 02/02/2018   Acute systolic heart failure (HCC) 02/02/2018   Non-STEMI  (non-ST elevated myocardial infarction) (HCC) 01/31/2018   Diverticulitis 12/07/2014   Diverticulitis of large intestine with perforation with bleeding    CAD (coronary artery disease), native coronary artery 12/05/2014   Essential hypertension 11/28/2014   Hyperlipidemia    MR (mitral regurgitation)    NSTEMI (non-ST elevated myocardial infarction) (HCC) 11/26/2014    Past Surgical History:  Procedure Laterality Date   CARDIAC CATHETERIZATION N/A 11/26/2014   Procedure: Left Heart Cath and Coronary Angiography;  Surgeon: Peter M Swaziland, MD;  Location: [facility] INVASIVE CV LAB;  Service: Cardiovascular;  Laterality: N/A;   COLONOSCOPY     per patient: done in [place], age 49, couple of polyps.   CORONARY STENT INTERVENTION N/A 01/31/2018   Procedure: CORONARY STENT INTERVENTION;  Surgeon: Runell Gess, MD;  Location: [facility] INVASIVE CV LAB;  Service: Cardiovascular;  Laterality: N/A;   LEFT HEART CATH AND CORONARY ANGIOGRAPHY N/A 01/31/2018   Procedure: LEFT HEART CATH AND CORONARY ANGIOGRAPHY;  Surgeon: Runell Gess, MD;  Location: [facility] INVASIVE CV LAB;  Service: Cardiovascular;  Laterality: N/A;   NECK SURGERY  2005       Family History  Problem Relation Age of Onset   Heart attack Father    Cancer Sister    Hypertension Brother    Colon cancer Neg Hx     Social History   Tobacco Use  Smoking status: Every Day    Packs/day: 0.50    Types: Cigarettes   Smokeless tobacco: Never  Vaping Use   Vaping Use: Never used  Substance Use Topics   Alcohol use: Yes    Alcohol/week: 0.0 standard drinks    Comment: 1-2 beers weekly   Drug use: No    Home Medications Prior to Admission medications   Medication Sig Start Date End Date Taking? Authorizing Provider  atorvastatin (LIPITOR) 80 MG tablet TAKE 1 TABLET BY MOUTH EVERY DAY AT 6 PM 02/28/18   Jodelle Gross, NP  BRILINTA 90 MG TABS tablet Take 1 tablet by mouth twice daily 05/23/19   [name], MD  Cyanocobalamin  (B-12) 2500 MCG TABS Take 1 tablet by mouth daily.    [provider]  Ferrous Sulfate (IRON) 28 MG TABS Take 1 tablet by mouth daily.    [provider]  Providence Lanius (OMEGA-3) 500 MG CAPS Take 1 capsule by mouth daily.    [provider]  lisinopril (ZESTRIL) 5 MG tablet Take 1 tablet (5 mg total) by mouth daily. 03/06/19   Jodelle Gross, NP  metoprolol succinate (TOPROL-XL) 25 MG 24 hr tablet Take 1 tablet (25 mg total) by mouth daily. 03/06/19   Jodelle Gross, NP  nitroGLYCERIN (NITROSTAT) 0.4 MG SL tablet Place 1 tablet (0.4 mg total) under the tongue every 5 (five) minutes as needed. 02/02/18   Arty Baumgartner, NP  Potassium 99 MG TABS Take 1 tablet by mouth daily.    [provider]  varenicline (CHANTIX CONTINUING MONTH PAK) 1 MG tablet Take 1 tablet (1 mg total) by mouth 2 (two) times daily. 02/15/18   Jodelle Gross, NP    Allergies    Patient has no known allergies.  Review of Systems   Review of Systems  Constitutional:  Negative for chills, fatigue and fever.  HENT:  Negative for ear pain and sore throat.   Eyes:  Negative for pain and visual disturbance.  Respiratory:  Positive for shortness of breath. Negative for cough.   Cardiovascular:  Positive for chest pain and palpitations. Negative for leg swelling.  Gastrointestinal:  Negative for abdominal pain, nausea and vomiting.  Genitourinary:  Negative for dysuria and hematuria.  Musculoskeletal:  Negative for arthralgias and back pain.  Skin:  Negative for color change and rash.  Neurological:  Positive for numbness. Negative for seizures and syncope.  All other systems reviewed and are negative.  Physical Exam Updated Vital Signs There were no vitals taken for this visit.  Physical Exam Vitals and nursing note reviewed. Exam conducted with a chaperone present.  Constitutional:      Appearance: Normal appearance.  HENT:     Head: Normocephalic and atraumatic.  Eyes:      General: No scleral icterus.       Right eye: No discharge.        Left eye: No discharge.     Extraocular Movements: Extraocular movements intact.     Pupils: Pupils are equal, round, and reactive to light.  Cardiovascular:     Rate and Rhythm: Normal rate and regular rhythm.     Pulses: Normal pulses.     Heart sounds: Normal heart sounds. No murmur heard.   No friction rub. No gallop.     Comments: Radial pulse 2+ and equal bilaterally Pulmonary:     Effort: Pulmonary effort is normal. No respiratory distress.     Breath sounds: Normal  breath sounds.     Comments: Lungs are clear to auscultation bilaterally, no tachypnea or accessory muscle use. Abdominal:     General: Abdomen is flat. Bowel sounds are normal. There is no distension.     Palpations: Abdomen is soft.     Tenderness: There is no abdominal tenderness.  Musculoskeletal:     Comments: No edema, legs are roughly symmetric bilaterally  Skin:    General: Skin is warm and dry.     Coloration: Skin is not jaundiced.  Neurological:     Mental Status: He is alert. Mental status is at baseline.     Coordination: Coordination normal.    ED Results / Procedures / Treatments   Labs (all labs ordered are listed, but only abnormal results are displayed) Labs Reviewed  COMPREHENSIVE METABOLIC PANEL  CBC WITH DIFFERENTIAL/PLATELET  BRAIN NATRIURETIC PEPTIDE  TROPONIN I (HIGH SENSITIVITY)    EKG None  Radiology No results found.  Procedures Procedures   Medications Ordered in ED Medications  aspirin tablet 325 mg (has no administration in time range)    ED Course  I have reviewed the triage vital signs and the nursing notes.  Pertinent labs & imaging results that were available during my care of the patient were reviewed by me and considered in my medical decision making (see chart for details).  Clinical Course as of 03/19/21 1435  Wed Mar 19, 2021  9628 ED EKG LVH, no STE or depressions [HS]  0955  SpO2(!): 18 % 100% in room. [HS]  1048 CBC with Differential No leukocytosis, no anemia [HS]    Clinical Course User Index [HS] Theron Arista, PA-C   MDM Rules/Calculators/A&P                           Patient with significant cardiac history and poor follow-up with chest pain that is improved with nitroglycerin.  Concern for ACS, will proceed with work-up.  Will give aspirin. Heart score 5.   Per chart review, patient's last catheterization was in in August 2019 for NSTEMI occluded left circumflex.  This was treated with a drug-eluting stent, with proximal to mid circumflex lesion 100% stenosis and mid RCA lesion 100% stenosed with ostial first diagonal lesion 75% stenosed.  The patient was also found to have moderate to severe LV systolic dysfunction with an EF of 35% to 45%. Has not seen cardiologist since this time.  EKG without any acute findings or ST elevations, does have LVH.  Negative delta troponins, no leukocytosis or anemia.  Radiograph without any evidence of acute disease, no pleural effusions or signs concerning for heart failure.  No pneumonia.  Patient vitals are stable, chest pain resolved with nitro and has not occurred since then.  Overall, work-up is reassuring and stable.  Patient does not appear to be having ACS at this time.  No evidence concerning for emergent etiology of the chest pain.  However, given patient's risk factors and history I will consult with cardiology and get their opinion about outpatient follow-up versus inpatient admission for observation.  Spoke with cardiac allergist Dr. Diona Browner, he thinks patient is appropriate for close cardiology follow-up outpatient and return precautions.  Will discharge patient.  Final Clinical Impression(s) / ED Diagnoses Final diagnoses:  None    Rx / DC Orders ED Discharge Orders     None        Theron Arista, [place] 03/19/21 1436    Cathren Laine, MD 03/19/21  1516  

## 2021-04-29 ENCOUNTER — Encounter: Payer: Self-pay | Admitting: Cardiology

## 2021-04-29 NOTE — Progress Notes (Deleted)
Cardiology Office Note  Date: 04/29/2021   ID: Rodney Mahoney, DOB July 15, 1960, MRN [id number]  PCP:  Pcp, No  Cardiologist:  Nona Dell, MD Electrophysiologist:  None   No chief complaint on file.   History of Present Illness: Rodney Mahoney is a 60 y.o. male presenting to reestablish cardiology follow-up.  He was seen in the ER in October, I reviewed the note.  His last encounter with our cardiology practice was in 2020, he apparently moved to [place] in the interim and is now back in the area.  Past Medical History:  Diagnosis Date   Bronchitis    CAD (coronary artery disease)    a. cath 11/26/2014 EF 45%, moderate to severe inferior hypokinesis, 90% small D1, 100% mid to distal RCA with minimal collateral, 40% mid to distal LCx. Medical therapy as RCA occlusion appears to be completed event b. echo 11/27/2014 EF 50-55%   Essential hypertension    Hyperlipidemia    MR (mitral regurgitation)    Mild by echo 11/27/2014   NSTEMI (non-ST elevated myocardial infarction) (HCC)    11/26/14, 01/31/18 PCI/DES to mLCx, EF 35-40%    Past Surgical History:  Procedure Laterality Date   CARDIAC CATHETERIZATION N/A 11/26/2014   Procedure: Left Heart Cath and Coronary Angiography;  Surgeon: Peter M Swaziland, MD;  Location: [facility] INVASIVE CV LAB;  Service: Cardiovascular;  Laterality: N/A;   COLONOSCOPY     per patient: done in [place], age 17, couple of polyps.   CORONARY STENT INTERVENTION N/A 01/31/2018   Procedure: CORONARY STENT INTERVENTION;  Surgeon: Runell Gess, MD;  Location: [facility] INVASIVE CV LAB;  Service: Cardiovascular;  Laterality: N/A;   LEFT HEART CATH AND CORONARY ANGIOGRAPHY N/A 01/31/2018   Procedure: LEFT HEART CATH AND CORONARY ANGIOGRAPHY;  Surgeon: Runell Gess, MD;  Location: [facility] INVASIVE CV LAB;  Service: Cardiovascular;  Laterality: N/A;   NECK SURGERY  2005    Current Outpatient Medications  Medication Sig Dispense Refill   atorvastatin (LIPITOR)  80 MG tablet TAKE 1 TABLET BY MOUTH EVERY DAY AT 6 PM (Patient taking differently: Take 80 mg by mouth daily.) 30 tablet 5   BRILINTA 90 MG TABS tablet Take 1 tablet by mouth twice daily (Patient not taking: No sig reported) 60 tablet 0   lisinopril (ZESTRIL) 5 MG tablet Take 1 tablet (5 mg total) by mouth daily. 90 tablet 3   metoprolol succinate (TOPROL-XL) 25 MG 24 hr tablet Take 1 tablet (25 mg total) by mouth daily. 90 tablet 3   nitroGLYCERIN (NITROSTAT) 0.4 MG SL tablet Place 1 tablet (0.4 mg total) under the tongue every 5 (five) minutes as needed. 25 tablet 0   varenicline (CHANTIX CONTINUING MONTH PAK) 1 MG tablet Take 1 tablet (1 mg total) by mouth 2 (two) times daily. (Patient not taking: No sig reported) 90 tablet 1   warfarin (COUMADIN) 5 MG tablet Take 5 mg by mouth daily.     No current facility-administered medications for this visit.   Allergies:  Patient has no known allergies.   Social History: The patient  reports that he has been smoking cigarettes. He has been smoking an average of .5 packs per day. He has never used smokeless tobacco. He reports current alcohol use. He reports that he does not use drugs.   Family History: The patient's family history includes Cancer in his sister; Heart attack in his father; Hypertension in his brother.   ROS:  Please see the history  of present illness. Otherwise, complete review of systems is positive for {NONE DEFAULTED:18576}.  All other systems are reviewed and negative.   Physical Exam: VS:  There were no vitals taken for this visit., BMI There is no height or weight on file to calculate BMI.  Wt Readings from Last 3 Encounters:  03/06/19 205 lb (93 kg)  02/15/18 195 lb 3.2 oz (88.5 kg)  02/02/18 187 lb (84.8 kg)    General: Patient appears comfortable at rest. HEENT: Conjunctiva and lids normal, oropharynx clear with moist mucosa. Neck: Supple, no elevated JVP or carotid bruits, no thyromegaly. Lungs: Clear to auscultation,  nonlabored breathing at rest. Cardiac: Regular rate and rhythm, no S3 or significant systolic murmur, no pericardial rub. Abdomen: Soft, nontender, no hepatomegaly, bowel sounds present, no guarding or rebound. Extremities: No pitting edema, distal pulses 2+. Skin: Warm and dry. Musculoskeletal: No kyphosis. Neuropsychiatric: Alert and oriented x3, affect grossly appropriate.  ECG:  An ECG dated 03/19/2021 was personally reviewed today and demonstrated:  Sinus rhythm with LVH.  Recent Labwork: 03/19/2021: ALT 40; AST 35; B Natriuretic Peptide 46.0; BUN 12; Creatinine, Ser 0.79; Hemoglobin 13.7; Platelets 231; Potassium 4.1; Sodium 136   Other Studies Reviewed Today:  Echocardiogram 02/02/2018: - Left ventricle: The cavity size was mildly dilated. Systolic    function was moderately reduced. The estimated ejection fraction    was in the range of 35% to 40%. There is akinesis and aneurysmal    deformity of the basal-midinferolateral, inferior, and    inferoseptal myocardium. Doppler parameters are consistent with    abnormal left ventricular relaxation (grade 1 diastolic    dysfunction).  - Mitral valve: Mildly thickened leaflets . There was mild    regurgitation.  - Left atrium: The atrium was mildly dilated.   Chest x-ray 03/19/2021: FINDINGS: The heart is enlarged.  The mediastinal contours are normal.   There is no focal consolidation or pulmonary edema. There is no pleural effusion or pneumothorax.   There is no acute osseous abnormality. Cervical spine fusion hardware is noted.   IMPRESSION: Cardiomegaly. Otherwise, no radiographic evidence of acute cardiopulmonary process.  Assessment and Plan:   Medication Adjustments/Labs and Tests Ordered: Current medicines are reviewed at length with the patient today.  Concerns regarding medicines are outlined above.   Tests Ordered: No orders of the defined types were placed in this encounter.   Medication Changes: No orders  of the defined types were placed in this encounter.   Disposition:  Follow up {follow up:15908}  Signed, [name], MD, [facility] 04/29/2021 3:49 PM    [facility] at [facility] [address] [place], [place] [address] Phone: [phone]; Fax: [phone]

## 2021-05-01 ENCOUNTER — Ambulatory Visit: Payer: BC Managed Care – PPO | Admitting: Cardiology

## 2021-05-01 DIAGNOSIS — I25119 Atherosclerotic heart disease of native coronary artery with unspecified angina pectoris: Secondary | ICD-10-CM

## 2021-05-14 ENCOUNTER — Other Ambulatory Visit: Payer: Self-pay

## 2021-05-14 ENCOUNTER — Ambulatory Visit
Admission: EM | Admit: 2021-05-14 | Discharge: 2021-05-14 | Disposition: A | Discharge status: Home / Self Care | Payer: BC Managed Care – PPO | Attending: Family Medicine | Admitting: Family Medicine

## 2021-05-14 DIAGNOSIS — Z760 Encounter for issue of repeat prescription: Secondary | ICD-10-CM

## 2021-05-14 MED ORDER — WARFARIN SODIUM 5 MG PO TABS: 5.0000 mg | ORAL_TABLET | Freq: Every day | ORAL | 0 refills | Status: DC

## 2021-05-14 NOTE — ED Provider Notes (Signed)
[facility]   [id number] 05/14/21 Arrival Time: 1520  ASSESSMENT & PLAN:  1. Encounter for medication refill     Meds ordered this encounter  Medications   warfarin (COUMADIN) 5 MG tablet    Sig: Take 1 tablet (5 mg total) by mouth daily.    Dispense:  30 tablet    Refill:  0   Has appt Dec 16 with new PCP.  Reviewed expectations re: course of current medical issues. Questions answered. Outlined signs and symptoms indicating need for more acute intervention. Patient verbalized understanding. After Visit Summary given.   SUBJECTIVE: History from: patient. Rodney Mahoney is a 60 y.o. male who presents requesting medication refill. No current concerns. Out of Warfarin yesterday.  Current medical problems include: Past Medical History:  Diagnosis Date   Bronchitis    CAD (coronary artery disease)    a. cath 11/26/2014 EF 45%, moderate to severe inferior hypokinesis, 90% small D1, 100% mid to distal RCA with minimal collateral, 40% mid to distal LCx. Medical therapy as RCA occlusion appears to be completed event; DES to mid circumflex October 2019   Essential hypertension    Hyperlipidemia    Ischemic cardiomyopathy    NSTEMI (non-ST elevated myocardial infarction) (HCC)    11/26/14, 01/31/18      No current facility-administered medications for this encounter.  Current Outpatient Medications:    atorvastatin (LIPITOR) 80 MG tablet, TAKE 1 TABLET BY MOUTH EVERY DAY AT 6 PM (Patient taking differently: Take 80 mg by mouth daily.), Disp: 30 tablet, Rfl: 5   BRILINTA 90 MG TABS tablet, Take 1 tablet by mouth twice daily (Patient not taking: No sig reported), Disp: 60 tablet, Rfl: 0   lisinopril (ZESTRIL) 5 MG tablet, Take 1 tablet (5 mg total) by mouth daily., Disp: 90 tablet, Rfl: 3   metoprolol succinate (TOPROL-XL) 25 MG 24 hr tablet, Take 1 tablet (25 mg total) by mouth daily., Disp: 90 tablet, Rfl: 3   nitroGLYCERIN (NITROSTAT) 0.4 MG SL tablet, Place 1  tablet (0.4 mg total) under the tongue every 5 (five) minutes as needed., Disp: 25 tablet, Rfl: 0   varenicline (CHANTIX CONTINUING MONTH PAK) 1 MG tablet, Take 1 tablet (1 mg total) by mouth 2 (two) times daily. (Patient not taking: No sig reported), Disp: 90 tablet, Rfl: 1   warfarin (COUMADIN) 5 MG tablet, Take 1 tablet (5 mg total) by mouth daily., Disp: 30 tablet, Rfl: 0    OBJECTIVE:  Vitals:   05/14/21 1656  BP: (!) 143/75  Pulse: 84  Resp: 20  Temp: 98.8 F (37.1 C)  TempSrc: Oral  SpO2: 95%    General appearance: alert; no distress Psychological: alert and cooperative; normal mood and affect    No Known Allergies  Social History   Socioeconomic History   Marital status: Married    Spouse name: Not on file   Number of children: Not on file   Years of education: Not on file   Highest education level: Not on file  Occupational History   Not on file  Tobacco Use   Smoking status: Every Day    Packs/day: 0.50    Types: Cigarettes   Smokeless tobacco: Never  Vaping Use   Vaping Use: Never used  Substance and Sexual Activity   Alcohol use: Yes    Alcohol/week: 0.0 standard drinks    Comment: 1-2 beers weekly   Drug use: No   Sexual activity: Yes    Birth control/protection: None  Other Topics Concern   Not on file  Social History Narrative   Not on file   Social Determinants of Health   Financial Resource Strain: Not on file  Food Insecurity: Not on file  Transportation Needs: Not on file  Physical Activity: Not on file  Stress: Not on file  Social Connections: Not on file  Intimate Partner Violence: Not on file   Family History  Problem Relation Age of Onset   Heart attack Father    Cancer Sister    Hypertension Brother    Colon cancer Neg Hx    Past Surgical History:  Procedure Laterality Date   CARDIAC CATHETERIZATION N/A 11/26/2014   Procedure: Left Heart Cath and Coronary Angiography;  Surgeon: Peter M Swaziland, MD;  Location: [facility] INVASIVE  CV LAB;  Service: Cardiovascular;  Laterality: N/A;   COLONOSCOPY     per patient: done in [place], age 61, couple of polyps.   CORONARY STENT INTERVENTION N/A 01/31/2018   Procedure: CORONARY STENT INTERVENTION;  Surgeon: Runell Gess, MD;  Location: [facility] INVASIVE CV LAB;  Service: Cardiovascular;  Laterality: N/A;   LEFT HEART CATH AND CORONARY ANGIOGRAPHY N/A 01/31/2018   Procedure: LEFT HEART CATH AND CORONARY ANGIOGRAPHY;  Surgeon: Runell Gess, MD;  Location: [facility] INVASIVE CV LAB;  Service: Cardiovascular;  Laterality: N/A;   NECK SURGERY  2005      Mardella Layman, MD 05/14/21 [id number]

## 2021-05-14 NOTE — ED Triage Notes (Signed)
Patient states he needs a refill on his coumidin.   He states he just PCP and he goes to see his new doctor on December 16,2022.

## 2021-05-30 ENCOUNTER — Ambulatory Visit: Payer: BC Managed Care – PPO | Admitting: Nurse Practitioner

## 2021-05-30 ENCOUNTER — Other Ambulatory Visit: Payer: Self-pay

## 2021-05-30 ENCOUNTER — Encounter: Payer: Self-pay | Admitting: Nurse Practitioner

## 2021-05-30 VITALS — BP 140/68 | HR 74 | Ht 66.0 in | Wt 197.0 lb

## 2021-05-30 DIAGNOSIS — Z1211 Encounter for screening for malignant neoplasm of colon: Secondary | ICD-10-CM

## 2021-05-30 DIAGNOSIS — Z139 Encounter for screening, unspecified: Secondary | ICD-10-CM

## 2021-05-30 DIAGNOSIS — I2510 Atherosclerotic heart disease of native coronary artery without angina pectoris: Secondary | ICD-10-CM | POA: Diagnosis not present

## 2021-05-30 DIAGNOSIS — I10 Essential (primary) hypertension: Secondary | ICD-10-CM

## 2021-05-30 DIAGNOSIS — Z23 Encounter for immunization: Secondary | ICD-10-CM

## 2021-05-30 DIAGNOSIS — Z720 Tobacco use: Secondary | ICD-10-CM

## 2021-05-30 DIAGNOSIS — Z7901 Long term (current) use of anticoagulants: Secondary | ICD-10-CM

## 2021-05-30 DIAGNOSIS — K5721 Diverticulitis of large intestine with perforation and abscess with bleeding: Secondary | ICD-10-CM

## 2021-05-30 DIAGNOSIS — E782 Mixed hyperlipidemia: Secondary | ICD-10-CM

## 2021-05-30 DIAGNOSIS — E669 Obesity, unspecified: Secondary | ICD-10-CM

## 2021-05-30 DIAGNOSIS — I5021 Acute systolic (congestive) heart failure: Secondary | ICD-10-CM

## 2021-05-30 MED ORDER — WARFARIN SODIUM 5 MG PO TABS
5.0000 mg | ORAL_TABLET | Freq: Every day | ORAL | 1 refills | Status: DC
Start: 2021-05-30 — End: 2021-06-02

## 2021-05-30 MED ORDER — METOPROLOL SUCCINATE ER 25 MG PO TB24: 25.0000 mg | ORAL_TABLET | Freq: Every day | ORAL | 3 refills | Status: DC

## 2021-05-30 MED ORDER — LISINOPRIL 5 MG PO TABS: 5.0000 mg | ORAL_TABLET | Freq: Every day | ORAL | 3 refills | Status: DC

## 2021-05-30 MED ORDER — ATORVASTATIN CALCIUM 80 MG PO TABS
80.0000 mg | ORAL_TABLET | Freq: Every day | ORAL | 5 refills | Status: DC
Start: 2021-05-30 — End: 2022-08-25

## 2021-05-30 NOTE — Patient Instructions (Addendum)
LABWORK  NEEDS TO BE DONE BETWEEN 3 TO 7 DAYS BEFORE YOUR NEXT SCEDULED  VISIT.  THIS WILL IMPROVE THE QUALITY OF YOUR CARE.  Get your shingles and tdap vaccine at your pharmacy .   It is important that you exercise regularly at least 30 minutes 5 times a week.  Think about what you will eat, plan ahead. Choose " clean, green, fresh or frozen" over canned, processed or packaged foods which are more sugary, salty and fatty. 70 to 75% of food eaten should be vegetables and fruit. Three meals at set times with snacks allowed between meals, but they must be fruit or vegetables. Aim to eat over a 12 hour period , example 7 am to 7 pm, and STOP after  your last meal of the day. Drink water,generally about 64 ounces per day, no other drink is as healthy. Fruit juice is best enjoyed in a healthy way, by EATING the fruit.  Thanks for choosing [facility], we consider it a privelige to serve you.

## 2021-05-30 NOTE — Progress Notes (Signed)
° °  Rodney Mahoney     MRN: [id number]      DOB: [birth year]   HPI Rodney Mahoney is here to establish care. Former pt of Dr Delbert Harness. Does not remember when he had his last physical. He is due for colonoscopy, TDAP, shingles, pneumococcal vaccine.  HTN  Takes metoprolol 25 mg daily, lisinopril 5 mg daily.  Coronary Artery Disease, MVR, NSTEMI;  cath in 2016 EF 35-45%. Last cardiology visit was in 2019. Pt was on brillianta , he does not know why medication was stopped , now takes coumadin 5mg  daily. Lat coumadin level was in July. Was previously getting labs everyday. Takes nitroglycerin SL as needed.   Tobacco abuse : Current smoker, smoked 1 pack per day for 42 years. He has quit for about 5 months and started back . He has not quit for the past 5 years.   Diverticulitis of large intestine with bleeding.    ROS Denies recent fever or chills. Denies sinus pressure, nasal congestion, ear pain or sore throat. Denies chest congestion, productive cough or wheezing. Denies chest pains, palpitations and leg swelling Denies abdominal pain, nausea, vomiting,diarrhea or constipation.   Denies dysuria, frequency, hesitancy or incontinence. Denies joint pain, swelling and limitation in mobility. Denies headaches, seizures, numbness, or tingling. Denies depression, anxiety or insomnia. Denies skin break down or rash.   PE  BP 140/68 (BP Location: Right Arm, Cuff Size: Normal)    Pulse 74    Ht 5\' 6"  (1.676 m)    Wt 197 lb (89.4 kg)    SpO2 94%    BMI 31.80 kg/m   Patient alert and oriented and in no cardiopulmonary distress.  HEENT: No facial asymmetry, EOMI,     Neck supple .  Chest: Clear to auscultation bilaterally.  CVS: S1, S2 no murmurs, no S3.Regular rate.  ABD: Soft non tender.   Ext: No edema  MS: Adequate ROM spine, shoulders, hips and knees.  Skin: Intact, no ulcerations or rash noted.  Psych: Good eye contact, normal affect. Memory intact not anxious or depressed  appearing.  CNS: CN 2-12 intact, power,  normal throughout.no focal deficits noted.   Assessment & Plan

## 2021-05-31 ENCOUNTER — Encounter: Payer: Self-pay | Admitting: Nurse Practitioner

## 2021-05-31 DIAGNOSIS — Z1211 Encounter for screening for malignant neoplasm of colon: Secondary | ICD-10-CM | POA: Insufficient documentation

## 2021-05-31 DIAGNOSIS — Z23 Encounter for immunization: Secondary | ICD-10-CM | POA: Insufficient documentation

## 2021-05-31 DIAGNOSIS — E669 Obesity, unspecified: Secondary | ICD-10-CM | POA: Insufficient documentation

## 2021-05-31 LAB — PROTIME-INR
INR: 1.5 — ABNORMAL HIGH (ref 0.9–1.2)
Prothrombin Time: 15.6 s — ABNORMAL HIGH (ref 9.1–12.0)

## 2021-05-31 NOTE — Assessment & Plan Note (Signed)
GI referal placed.

## 2021-05-31 NOTE — Assessment & Plan Note (Signed)
Not ready to quit , smokes 1 pack per day, need to quit smoking including risk of lung cancer, vascular diseases,  discussed with pt, he verbalized understanding.

## 2021-05-31 NOTE — Assessment & Plan Note (Signed)
DASH diet and commitment to daily physical activity for a minimum of 30 minutes discussed and encouraged, as a part of hypertension management. The importance of attaining a healthy weight is also discussed.  BP/Weight 05/30/2021 05/14/2021 03/19/2021 07/08/2020 03/06/2019 02/15/2018 02/02/2018  Systolic BP 140 143 140 158 - 169 117  Diastolic BP 68 75 77 91 - 70 74  Wt. (Lbs) 197 - - - 205 195.2 187  BMI 31.8 - - - 33.09 31.51 30.18   Continue metoprolol 25mg  daily and lisinopril 5 mg daily.

## 2021-05-31 NOTE — Assessment & Plan Note (Addendum)
Takes metoprolol and lisinopril, atorvastatin 80 mg daily.  Takes coumadin 5mg  daily, nitroglycerin sl as needed , he has not had coumadin level checked for months. Last cardiology visit was in 2019. Referral made to cardiology today

## 2021-05-31 NOTE — Assessment & Plan Note (Signed)
Cardiology referral  made today.

## 2021-05-31 NOTE — Assessment & Plan Note (Signed)
Takes atorvastatin 80mg  daily. Eat a healthy diet, including lots of fruits and vegetables. Avoid foods with a lot of saturated and trans fats, such as red meat, butter, fried foods and cheese . Lipid panel next visit.

## 2021-05-31 NOTE — Assessment & Plan Note (Signed)
importance of diet and regular exercise discussed with pt.

## 2021-05-31 NOTE — Assessment & Plan Note (Signed)
Not complaint of abdominal pain or bloody stools. Due for colonoscopy , referral made to GI.

## 2021-05-31 NOTE — Assessment & Plan Note (Signed)
He has not seen cardiology since 2019, referral made to cardiology today. No symptoms of acute systolic heart failure noted today.

## 2021-06-02 ENCOUNTER — Other Ambulatory Visit: Payer: Self-pay | Admitting: Nurse Practitioner

## 2021-06-02 DIAGNOSIS — I2510 Atherosclerotic heart disease of native coronary artery without angina pectoris: Secondary | ICD-10-CM | POA: Insufficient documentation

## 2021-06-02 MED ORDER — WARFARIN SODIUM 5 MG PO TABS: 5.0000 mg | ORAL_TABLET | Freq: Every day | ORAL | 1 refills | Status: DC

## 2021-06-02 NOTE — Addendum Note (Signed)
Addended by: Donell Beers on: 06/02/2021 01:10 PM   Modules accepted: Orders

## 2021-06-02 NOTE — Addendum Note (Signed)
Addended by: Donell Beers on: 06/02/2021 01:14 PM   Modules accepted: Orders

## 2021-06-02 NOTE — Progress Notes (Signed)
Please discus lab with pt. INR level is not at goal. Take coumadin 7.5mg  on MON, WED, FRI and Sunday. Take 5mg  on Tuesday and Thursday for the next 2 weeks.Recheck PT/INR on Monday 06/16/2021. Pt should report any bleeding. Thanks

## 2021-06-03 ENCOUNTER — Telehealth: Payer: Self-pay | Admitting: Nurse Practitioner

## 2021-06-03 ENCOUNTER — Encounter: Payer: Self-pay | Admitting: Internal Medicine

## 2021-06-03 NOTE — Telephone Encounter (Signed)
LM for patient to return call.

## 2021-06-03 NOTE — Telephone Encounter (Signed)
Pt called in for blood work results  °

## 2021-06-03 NOTE — Telephone Encounter (Signed)
Called and notified patient of lab results, he verbalized understanding.

## 2021-06-27 ENCOUNTER — Other Ambulatory Visit: Payer: Self-pay

## 2021-06-27 ENCOUNTER — Ambulatory Visit (INDEPENDENT_AMBULATORY_CARE_PROVIDER_SITE_OTHER): Payer: BC Managed Care – PPO | Admitting: Medical

## 2021-06-27 ENCOUNTER — Encounter: Payer: Self-pay | Admitting: Medical

## 2021-06-27 VITALS — BP 132/78 | HR 82 | Ht 66.0 in | Wt 201.0 lb

## 2021-06-27 DIAGNOSIS — I5021 Acute systolic (congestive) heart failure: Secondary | ICD-10-CM

## 2021-06-27 DIAGNOSIS — Z720 Tobacco use: Secondary | ICD-10-CM

## 2021-06-27 DIAGNOSIS — E782 Mixed hyperlipidemia: Secondary | ICD-10-CM

## 2021-06-27 DIAGNOSIS — I2510 Atherosclerotic heart disease of native coronary artery without angina pectoris: Secondary | ICD-10-CM | POA: Diagnosis not present

## 2021-06-27 DIAGNOSIS — I5020 Unspecified systolic (congestive) heart failure: Secondary | ICD-10-CM

## 2021-06-27 MED ORDER — DAPAGLIFLOZIN PROPANEDIOL 10 MG PO TABS: 10.0000 mg | ORAL_TABLET | Freq: Every day | ORAL | 6 refills | Status: DC

## 2021-06-27 MED ORDER — ASPIRIN EC 81 MG PO TBEC: 81.0000 mg | DELAYED_RELEASE_TABLET | Freq: Every day | ORAL | 3 refills | Status: AC

## 2021-06-27 NOTE — Progress Notes (Signed)
Cardiology Office Note:    Date:  06/27/2021   ID:  Rodney Mahoney, DOB [birth year], MRN [id number]  PCP:  Donell BeersPaseda, Folashade R, FNP  [facility] Cardiologist:  Nona DellSamuel McDowell, MD  [facility] Electrophysiologist:  None   Referring MD: No ref. provider found   Chief Complaint: overdue follow-up  History of Present Illness:    Rodney AngerRobert D Mahoney is a 61 y.o. male with a hx of CAD, with NSTEMI in August of 2019. Cardiac cath by Dr. Allyson SabalBerry revealing occluded LCX, S/P DES, he had chronic occlusion of the distal RCA with left to right collaterals. EF was found to be 35%-40% by cardiac cath. He was placed on DAPT with ASA and Brilinta, post cath for one year. He was advised on smoking cessation.   Last seen by televisit 03/06/19 and was symptomatically stable. [place] had moved to [place] and not been following regularly with a doctor. Brilinta was stopped.   Today, the patient reports he has been doing well. He is on coumadin, said he was changed by Dr. Cecelia Byarsiego PCP, last year, unsure why he changed. Appears he was switched from Brilinta to coumadin. Denies chest pain, SOB, LLE, orthopnea. Patient is still smoking 1/2 ppd. Wants to quit. Drink 2-3 beers day, he was recommended to decrease. Diet could be a lot better. Works as a [employer]er[employer].   Past Medical History:  Diagnosis Date   Bronchitis    CAD (coronary artery disease)    a. cath 11/26/2014 EF 45%, moderate to severe inferior hypokinesis, 90% small D1, 100% mid to distal RCA with minimal collateral, 40% mid to distal LCx. Medical therapy as RCA occlusion appears to be completed event; DES to mid circumflex October 2019   Essential hypertension    Hyperlipidemia    Ischemic cardiomyopathy    NSTEMI (non-ST elevated myocardial infarction) (HCC)    11/26/14, 01/31/18    Past Surgical History:  Procedure Laterality Date   CARDIAC CATHETERIZATION N/A 11/26/2014   Procedure: Left Heart Cath and Coronary Angiography;  Surgeon: Peter M  SwazilandJordan, MD;  [facility];  Service: Cardiovascular;  Laterality: N/A;   COLONOSCOPY     per patient: done in [place], age 61, couple of polyps.   CORONARY STENT INTERVENTION N/A 01/31/2018   Procedure: CORONARY STENT INTERVENTION;  Surgeon: Runell GessBerry, Jonathan J, MD;  Location: [facility] INVASIVE CV LAB;  Service: Cardiovascular;  Laterality: N/A;   LEFT HEART CATH AND CORONARY ANGIOGRAPHY N/A 01/31/2018   Procedure: LEFT HEART CATH AND CORONARY ANGIOGRAPHY;  Surgeon: Runell GessBerry, Jonathan J, MD;  Location: [facility] INVASIVE CV LAB;  Service: Cardiovascular;  Laterality: N/A;   NECK SURGERY  2005    Current Medications: Current Meds  Medication Sig   aspirin EC 81 MG tablet Take 1 tablet (81 mg total) by mouth daily. Swallow whole.   atorvastatin (LIPITOR) 80 MG tablet Take 1 tablet (80 mg total) by mouth daily. TAKE 1 TABLET BY MOUTH EVERY DAY AT 6 PM   dapagliflozin propanediol (FARXIGA) 10 MG TABS tablet Take 1 tablet (10 mg total) by mouth daily before breakfast.   lisinopril (ZESTRIL) 5 MG tablet Take 1 tablet (5 mg total) by mouth daily.   metoprolol succinate (TOPROL-XL) 25 MG 24 hr tablet Take 1 tablet (25 mg total) by mouth daily.   nitroGLYCERIN (NITROSTAT) 0.4 MG SL tablet Place 1 tablet (0.4 mg total) under the tongue every 5 (five) minutes as needed.   [DISCONTINUED] warfarin (COUMADIN) 5 MG tablet Take 1 tablet (  5 mg total) by mouth daily. Take 7.5 mg on Monday, Wednesday , Friday and Sunday. Take 5mg  on Tuesday and Thursday.     Allergies:   Patient has no known allergies.   Social History   Socioeconomic History   Marital status: Married    Spouse name: Not on file   Number of children: Not on file   Years of education: Not on file   Highest education level: Not on file  Occupational History   Not on file  Tobacco Use   Smoking status: Every Day    Packs/day: 1.00    Years: 42.00    Pack years: 42.00    Types: Cigarettes   Smokeless tobacco: Never   Tobacco comments:     He used to quit and start back. He has not quit for the past 5 years.   Vaping Use   Vaping Use: Never used  Substance and Sexual Activity   Alcohol use: Yes    Comment: 10 cans of beer   Drug use: Yes    Types: Marijuana    Comment: smokes week here and there   Sexual activity: Yes    Birth control/protection: None  Other Topics Concern   Not on file  Social History Narrative   Works at [employer] and live with wife.    Social Determinants of Health   Financial Resource Strain: Not on file  Food Insecurity: Not on file  Transportation Needs: Not on file  Physical Activity: Not on file  Stress: Not on file  Social Connections: Not on file     Family History: The patient's family history includes Cancer in his sister; Diabetes in his mother; Heart attack in his father; Hypertension in his brother. There is no history of Colon cancer, Prostate cancer, or Lung cancer.  ROS:   Please see the history of present illness.     All other systems reviewed and are negative.  EKGs/Labs/Other Studies Reviewed:    The following studies were reviewed today:  Cath: 01/31/18   Mid RCA lesion is 100% stenosed. Prox Cx to Mid Cx lesion is 100% stenosed. Ost 1st Diag lesion is 75% stenosed. A drug-eluting stent was successfully placed. Post intervention, there is a 0% residual stenosis. There is moderate to severe left ventricular systolic dysfunction. LV end diastolic pressure is mildly elevated. The left ventricular ejection fraction is 35-45% by visual estimate.   IMPRESSION: Successful PCI and drug-eluting stenting of an occluded mid nondominant left circumflex in the setting of a "non-STEMI".  He previously had an occluded RCA demonstrated by cath 3 years ago by Dr. 02/02/18 without collaterals and this was treated medically.  He now has grade 3 collaterals to his distal right coronary artery with moderately severe LV dysfunction.  He will need uninterrupted dual antibody therapy  for at least 12 months as well as optimization of his pharmacology for LV dysfunction.    EKG:  EKG is not ordered today.  Recent Labs: 03/19/2021: ALT 40; B Natriuretic Peptide 46.0; BUN 12; Creatinine, Ser 0.79; Hemoglobin 13.7; Platelets 231; Potassium 4.1; Sodium 136  Recent Lipid Panel No results found for: CHOL, TRIG, HDL, CHOLHDL, VLDL, LDLCALC, LDLDIRECT    Physical Exam:    VS:  BP 132/78    Pulse 82    Ht 5\' 6"  (1.676 m)    Wt 201 lb (91.2 kg)    SpO2 96%    BMI 32.44 kg/m     Wt Readings from Last 3  Encounters:  06/27/21 201 lb (91.2 kg)  05/30/21 197 lb (89.4 kg)  03/06/19 205 lb (93 kg)     GEN:  Well nourished, well developed in no acute distress HEENT: Normal NECK: No JVD; No carotid bruits LYMPHATICS: No lymphadenopathy CARDIAC: RRR, no murmurs, rubs, gallops RESPIRATORY:  Clear to auscultation without rales, wheezing or rhonchi  ABDOMEN: Soft, non-tender, non-distended MUSCULOSKELETAL:  No edema; No deformity  SKIN: Warm and dry NEUROLOGIC:  Alert and oriented x 3 PSYCHIATRIC:  Normal affect   ASSESSMENT:    1. Coronary artery disease involving native coronary artery of native heart without angina pectoris   2. Acute systolic heart failure (HCC)   3. HFrEF (heart failure with reduced ejection fraction) (HCC)   4. Mixed hyperlipidemia   5. Tobacco use    PLAN:    In order of problems listed above:  CAD s/p DES to LCX 2019 Patient underwent cath in 2019 with DES to LCx, chronic occlusion of the distal RCA with L>R collaterals. EF was 35-40%. Started on DAPT, a year later Brilinta was discontinued. Unsure of compliance and follow-up after that time. He reported since the PCP changed antiplatelet to warfarin. Does not have another reason for warfarin (afib/DVT). Patient denies anginal symptoms. Stop warfarin. Start Aspirin 81mg  daily. Continue Lipitor, BB, ACEi. He has NTG SL. Lifestyle changes encouraged.   ICM Echo in 2019 showed LVEF 35-40%. HE  appears euvolemic on exam. I will repeat limited echo today.Continue Toprol and lisionpril. I will start 2020, BMET in a week. Further GDMT pending echo.   HTN BP good today. Continue current medications.   HLD Repeat fasting Lipid panel. Continue atorvastatin.   Tobacco use Still smoking 1/2 ppd. Complete cessation recommended.   Disposition: Follow up in 1 month(s) with MD/APP     Signed, Karmela Bram Marcelline Deist, PA-C  06/27/2021 4:12 PM    [facility]

## 2021-06-27 NOTE — Patient Instructions (Signed)
Medication Instructions:  Your physician has recommended you make the following change in your medication:  STOP Coumadin START Aspirin 81 mg tablets daily START Farxiga 10 mg tablets daily  *If you need a refill on your cardiac medications before your next appointment, please call your pharmacy*   Lab Work: IN ONE WEEK: BMET FASTING LIPID PANEL If you have labs (blood work) drawn today and your tests are completely normal, you will receive your results only by: MyChart Message (if you have MyChart) OR A paper copy in the mail If you have any lab test that is abnormal or we need to change your treatment, we will call you to review the results.   Testing/Procedures: Your physician has requested that you have an echocardiogram. Echocardiography is a painless test that uses sound waves to create images of your heart. It provides your doctor with information about the size and shape of your heart and how well your hearts chambers and valves are working. This procedure takes approximately one hour. There are no restrictions for this procedure.    Follow-Up: At [facility], you and your health needs are our priority.  As part of our continuing mission to provide you with exceptional heart care, we have created designated Provider Care Teams.  These Care Teams include your primary Cardiologist (physician) and Advanced Practice Providers (APPs -  Physician Assistants and Nurse Practitioners) who all work together to provide you with the care you need, when you need it.  We recommend signing up for the patient portal called "MyChart".  Sign up information is provided on this After Visit Summary.  MyChart is used to connect with patients for Virtual Visits (Telemedicine).  Patients are able to view lab/test results, encounter notes, upcoming appointments, etc.  Non-urgent messages can be sent to your provider as well.   To learn more about what you can do with MyChart, go to  [email or web address].    Your next appointment:   1 month(s)  The format for your next appointment:   In Person  Provider:   Cadence Fransico Michael, PA-C1}    Other Instructions

## 2021-06-30 ENCOUNTER — Telehealth: Payer: Self-pay | Admitting: Medical

## 2021-07-01 NOTE — Telephone Encounter (Signed)
Checking percert on the following patient for testing scheduled at [facility].    ECHO   07/28/2021

## 2021-07-04 DIAGNOSIS — I5021 Acute systolic (congestive) heart failure: Secondary | ICD-10-CM | POA: Diagnosis not present

## 2021-07-04 NOTE — Addendum Note (Signed)
Addended by: Kerney Elbe on: 07/04/2021 03:48 PM   Modules accepted: Orders

## 2021-07-05 LAB — BASIC METABOLIC PANEL
BUN: 15 mg/dL (ref 7–25)
CO2: 27 mmol/L (ref 20–32)
Calcium: 9.1 mg/dL (ref 8.6–10.3)
Chloride: 106 mmol/L (ref 98–110)
Creat: 0.78 mg/dL (ref 0.70–1.35)
Glucose, Bld: 80 mg/dL (ref 65–139)
Potassium: 4.1 mmol/L (ref 3.5–5.3)
Sodium: 140 mmol/L (ref 135–146)

## 2021-07-05 LAB — LIPID PANEL
Cholesterol: 116 mg/dL (ref <200)
HDL: 37 mg/dL — ABNORMAL LOW (ref >=40)
LDL Cholesterol (Calc): 64 mg/dL (calc)
Non-HDL Cholesterol (Calc): 79 mg/dL (calc) (ref <130)
Total CHOL/HDL Ratio: 3.1 (calc) (ref <5.0)
Triglycerides: 66 mg/dL (ref <150)

## 2021-07-28 ENCOUNTER — Ambulatory Visit (HOSPITAL_COMMUNITY): Admission: RE | Admit: 2021-07-28 | Payer: BC Managed Care – PPO | Source: Ambulatory Visit

## 2021-07-31 ENCOUNTER — Other Ambulatory Visit: Payer: Self-pay

## 2021-07-31 ENCOUNTER — Encounter: Payer: Self-pay | Admitting: Nurse Practitioner

## 2021-07-31 ENCOUNTER — Encounter (INDEPENDENT_AMBULATORY_CARE_PROVIDER_SITE_OTHER): Payer: Self-pay

## 2021-07-31 ENCOUNTER — Ambulatory Visit (INDEPENDENT_AMBULATORY_CARE_PROVIDER_SITE_OTHER): Payer: BC Managed Care – PPO | Admitting: Nurse Practitioner

## 2021-07-31 VITALS — BP 140/72 | HR 78 | Ht 66.0 in | Wt 193.1 lb

## 2021-07-31 DIAGNOSIS — Z720 Tobacco use: Secondary | ICD-10-CM

## 2021-07-31 DIAGNOSIS — Z23 Encounter for immunization: Secondary | ICD-10-CM | POA: Diagnosis not present

## 2021-07-31 DIAGNOSIS — I10 Essential (primary) hypertension: Secondary | ICD-10-CM | POA: Diagnosis not present

## 2021-07-31 DIAGNOSIS — Z0001 Encounter for general adult medical examination with abnormal findings: Secondary | ICD-10-CM

## 2021-07-31 DIAGNOSIS — M1990 Unspecified osteoarthritis, unspecified site: Secondary | ICD-10-CM

## 2021-07-31 DIAGNOSIS — Z0000 Encounter for general adult medical examination without abnormal findings: Secondary | ICD-10-CM

## 2021-07-31 NOTE — Progress Notes (Signed)
Established Patient Office Visit  Subjective:  Patient ID: Rodney Mahoney, male    DOB: 1961-01-08  Age: 61 y.o. MRN: [id number]  CC:  Chief Complaint  Patient presents with   Annual Exam    CPE,Joints hurting     HPI MERWYN Mahoney presents for annual physical.   Pt c/o chronic bilateral hand joint pain, stiffness ongoing for about 3 years, tylenol helps his pain. He does a lot of grabbing , stacking at work. Pt currently denies pain , states that pain is aching pain 3/10 when its bad. Pt denies fever, chills, body aches, swelling , redness , warmth.    Pt  stated that he will get his yearly eye exam done next week, he hopes to get new glasses then   Current smoker. He used to quit and start back. He has not quit for the past 5 years. He plans to cut down to one pack a week, need to quit smoking including risk of lung cancer, copd discussed with pt.    Past Medical History:  Diagnosis Date   Bronchitis    CAD (coronary artery disease)    a. cath 11/26/2014 EF 45%, moderate to severe inferior hypokinesis, 90% small D1, 100% mid to distal RCA with minimal collateral, 40% mid to distal LCx. Medical therapy as RCA occlusion appears to be completed event; DES to mid circumflex October 2019   Essential hypertension    Hyperlipidemia    Ischemic cardiomyopathy    NSTEMI (non-ST elevated myocardial infarction) (HCC)    11/26/14, 01/31/18    Past Surgical History:  Procedure Laterality Date   CARDIAC CATHETERIZATION N/A 11/26/2014   Procedure: Left Heart Cath and Coronary Angiography;  Surgeon: Peter M Swaziland, MD;  Location: [facility] INVASIVE CV LAB;  Service: Cardiovascular;  Laterality: N/A;   COLONOSCOPY     per patient: done in [place], age 7, couple of polyps.   CORONARY STENT INTERVENTION N/A 01/31/2018   Procedure: CORONARY STENT INTERVENTION;  Surgeon: Runell Gess, MD;  Location: [facility] INVASIVE CV LAB;  Service: Cardiovascular;  Laterality: N/A;   LEFT HEART CATH AND  CORONARY ANGIOGRAPHY N/A 01/31/2018   Procedure: LEFT HEART CATH AND CORONARY ANGIOGRAPHY;  Surgeon: Runell Gess, MD;  Location: [facility] INVASIVE CV LAB;  Service: Cardiovascular;  Laterality: N/A;   NECK SURGERY  2005    Family History  Problem Relation Age of Onset   Diabetes Mother    Heart attack Father    Cancer Sister    Hypertension Brother    Colon cancer Neg Hx    Prostate cancer Neg Hx    Lung cancer Neg Hx     Social History   Socioeconomic History   Marital status: Married    Spouse name: Not on file   Number of children: Not on file   Years of education: Not on file   Highest education level: Not on file  Occupational History   Not on file  Tobacco Use   Smoking status: Every Day    Packs/day: 1.00    Years: 42.00    Pack years: 42.00    Types: Cigarettes   Smokeless tobacco: Never   Tobacco comments:    He used to quit and start back. He has not quit for the past 5 years.   Vaping Use   Vaping Use: Never used  Substance and Sexual Activity   Alcohol use: Yes    Comment: 10 cans of beer   Drug use:  Yes    Types: Marijuana    Comment: smokes week here and there   Sexual activity: Yes    Birth control/protection: None  Other Topics Concern   Not on file  Social History Narrative   Works at [employer] and live with wife.    Social Determinants of Health   Financial Resource Strain: Not on file  Food Insecurity: Not on file  Transportation Needs: Not on file  Physical Activity: Not on file  Stress: Not on file  Social Connections: Not on file  Intimate Partner Violence: Not on file    Outpatient Medications Prior to Visit  Medication Sig Dispense Refill   aspirin EC 81 MG tablet Take 1 tablet (81 mg total) by mouth daily. Swallow whole. 90 tablet 3   atorvastatin (LIPITOR) 80 MG tablet Take 1 tablet (80 mg total) by mouth daily. TAKE 1 TABLET BY MOUTH EVERY DAY AT 6 PM 30 tablet 5   dapagliflozin propanediol (FARXIGA) 10 MG TABS tablet  Take 1 tablet (10 mg total) by mouth daily before breakfast. 30 tablet 6   lisinopril (ZESTRIL) 5 MG tablet Take 1 tablet (5 mg total) by mouth daily. 90 tablet 3   metoprolol succinate (TOPROL-XL) 25 MG 24 hr tablet Take 1 tablet (25 mg total) by mouth daily. 90 tablet 3   nitroGLYCERIN (NITROSTAT) 0.4 MG SL tablet Place 1 tablet (0.4 mg total) under the tongue every 5 (five) minutes as needed. 25 tablet 0   No facility-administered medications prior to visit.    No Known Allergies  ROS Review of Systems  Constitutional: Negative.   HENT: Negative.    Eyes: Negative.   Respiratory: Negative.    Cardiovascular: Negative.   Gastrointestinal: Negative.   Endocrine: Negative.   Genitourinary: Negative.   Musculoskeletal:  Positive for arthralgias.  Allergic/Immunologic: Negative.   Neurological: Negative.   Hematological: Negative.   Psychiatric/Behavioral: Negative.       Objective:    Physical Exam Vitals reviewed.  Constitutional:      General: He is not in acute distress.    Appearance: He is obese. He is not ill-appearing, toxic-appearing or diaphoretic.  HENT:     Head: Normocephalic and atraumatic.     Right Ear: Tympanic membrane, ear canal and external ear normal. There is no impacted cerumen.     Left Ear: Tympanic membrane, ear canal and external ear normal. There is no impacted cerumen.     Nose: Nose normal. No congestion or rhinorrhea.     Mouth/Throat:     Mouth: Mucous membranes are moist.     Pharynx: Oropharynx is clear. No oropharyngeal exudate or posterior oropharyngeal erythema.  Eyes:     General: No scleral icterus.       Right eye: No discharge.        Left eye: No discharge.     Extraocular Movements: Extraocular movements intact.     Conjunctiva/sclera: Conjunctivae normal.     Pupils: Pupils are equal, round, and reactive to light.  Neck:     Vascular: No carotid bruit.  Cardiovascular:     Rate and Rhythm: Normal rate and regular rhythm.      Pulses: Normal pulses.     Heart sounds: Normal heart sounds. No murmur heard.   No friction rub. No gallop.  Pulmonary:     Effort: Pulmonary effort is normal. No respiratory distress.     Breath sounds: Normal breath sounds. No stridor. No wheezing, rhonchi or rales.  Abdominal:     General: There is no distension.     Palpations: Abdomen is soft. There is no mass.     Tenderness: There is no abdominal tenderness. There is no right CVA tenderness, left CVA tenderness, guarding or rebound.     Hernia: No hernia is present.  Musculoskeletal:        General: No swelling, tenderness, deformity or signs of injury. Normal range of motion.     Cervical back: Normal range of motion and neck supple. No rigidity or tenderness.     Right lower leg: No edema.     Left lower leg: No edema.  Lymphadenopathy:     Cervical: No cervical adenopathy.  Skin:    Capillary Refill: Capillary refill takes less than 2 seconds.     Coloration: Skin is not jaundiced or pale.     Findings: No bruising, erythema, lesion or rash.  Neurological:     Mental Status: He is alert and oriented to person, place, and time.     Cranial Nerves: No cranial nerve deficit.     Sensory: No sensory deficit.     Motor: No weakness.     Gait: Gait normal.     Deep Tendon Reflexes: Reflexes normal.  Psychiatric:        Mood and Affect: Mood normal.        Behavior: Behavior normal.        Thought Content: Thought content normal.        Judgment: Judgment normal.    BP 140/72    Pulse 78    Ht 5\' 6"  (1.676 m)    Wt 193 lb 1.3 oz (87.6 kg)    SpO2 95%    BMI 31.16 kg/m  Wt Readings from Last 3 Encounters:  07/31/21 193 lb 1.3 oz (87.6 kg)  06/27/21 201 lb (91.2 kg)  05/30/21 197 lb (89.4 kg)     Health Maintenance Due  Topic Date Due   HIV Screening  Never done   Hepatitis C Screening  Never done   TETANUS/TDAP  Never done   Zoster Vaccines- Shingrix (1 of 2) Never done    There are no preventive care  reminders to display for this patient.  No results found for: TSH Lab Results  Component Value Date   WBC 7.6 03/19/2021   HGB 13.7 03/19/2021   HCT 40.9 03/19/2021   MCV 92.5 03/19/2021   PLT 231 03/19/2021   Lab Results  Component Value Date   NA 140 07/04/2021   K 4.1 07/04/2021   CO2 27 07/04/2021   GLUCOSE 80 07/04/2021   BUN 15 07/04/2021   CREATININE 0.78 07/04/2021   BILITOT 0.9 03/19/2021   ALKPHOS 69 03/19/2021   AST 35 03/19/2021   ALT 40 03/19/2021   PROT 7.2 03/19/2021   ALBUMIN 4.3 03/19/2021   CALCIUM 9.1 07/04/2021   ANIONGAP 7 03/19/2021   Lab Results  Component Value Date   CHOL 116 07/04/2021   Lab Results  Component Value Date   HDL 37 (L) 07/04/2021   Lab Results  Component Value Date   LDLCALC 64 07/04/2021   Lab Results  Component Value Date   TRIG 66 07/04/2021   Lab Results  Component Value Date   CHOLHDL 3.1 07/04/2021   No results found for: HGBA1C    Assessment & Plan:   Problem List Items Addressed This Visit   None   No orders of the defined types were placed in  this encounter.   Follow-up: No follow-ups on file.    Donell BeersFolashade R Horatio Bertz, FNP

## 2021-07-31 NOTE — Assessment & Plan Note (Signed)
Current every day smoker  He plans to cut down to one pack a week by next visit. need to quit smoking including risk of lung cancer, copd discussed with pt. He verbalized understanding. 42 years of smoking 1 pack a day. Low dose CT chest lung scan ordered today

## 2021-07-31 NOTE — Patient Instructions (Signed)
Please get your shingles and TDAP vaccines today Pleas get your labs done today.   It is important that you exercise regularly at least 30 minutes 5 times a week.  Think about what you will eat, plan ahead. Choose " clean, green, fresh or frozen" over canned, processed or packaged foods which are more sugary, salty and fatty. 70 to 75% of food eaten should be vegetables and fruit. Three meals at set times with snacks allowed between meals, but they must be fruit or vegetables. Aim to eat over a 12 hour period , example 7 am to 7 pm, and STOP after  your last meal of the day. Drink water,generally about 64 ounces per day, no other drink is as healthy. Fruit juice is best enjoyed in a healthy way, by EATING the fruit.  Thanks for choosing [facility], we consider it a privelige to serve you.

## 2021-07-31 NOTE — Assessment & Plan Note (Addendum)
DASH diet and commitment to daily physical activity for a minimum of 30 minutes discussed and encouraged, as a part of hypertension management. The importance of attaining a healthy weight is also discussed.  BP/Weight 07/31/2021 06/27/2021 05/30/2021 05/14/2021 03/19/2021 07/08/2020 03/06/2019  Systolic BP 140 132 140 143 140 158 -  Diastolic BP 72 78 68 75 77 91 -  Wt. (Lbs) 193.08 201 197 - - - 205  BMI 31.16 32.44 31.8 - - - 33.09   Stable HTN continue lisinopril 5mg  daily, metoprolol 25mg  daily, Farxiga 10 mg daily.  Farxiga 10 mg daily recently added by cardiology.  Lab Results  Component Value Date   NA 140 07/04/2021   K 4.1 07/04/2021   CO2 27 07/04/2021   GLUCOSE 80 07/04/2021   BUN 15 07/04/2021   CREATININE 0.78 07/04/2021   CALCIUM 9.1 07/04/2021   GFRNONAA >60 03/19/2021

## 2021-07-31 NOTE — Assessment & Plan Note (Signed)
Bilateral hands due to arthritis  Denies pain at time of examination states that pain occurs with activity.  Takes OTC tylenol 650mg   Continue OTC tylenol 650mg  every 6 hours as needed.

## 2021-07-31 NOTE — Assessment & Plan Note (Signed)
TDAP and shingles vaccine given today.  Patient educated on CDC recommendation for the vaccine. Verbal consent was obtained from the patient, vaccine administered by nurse, no sign of adverse reactions noted at this time. Patient education on arm soreness and use of tylenol or ibuprofen (if safe) for this patient  was discussed. Patient educated on the signs and symptoms of adverse effect and advise to contact the office if they occur.

## 2021-08-01 LAB — VITAMIN D 25 HYDROXY (VIT D DEFICIENCY, FRACTURES): Vit D, 25-Hydroxy: 65.7 ng/mL (ref 30.0–100.0)

## 2021-08-01 LAB — TSH: TSH: 0.816 u[IU]/mL (ref 0.450–4.500)

## 2021-08-01 LAB — HIV ANTIBODY (ROUTINE TESTING W REFLEX): HIV Screen 4th Generation wRfx: NONREACTIVE

## 2021-08-01 LAB — PSA: Prostate Specific Ag, Serum: 0.6 ng/mL (ref 0.0–4.0)

## 2021-08-01 LAB — HEMOGLOBIN A1C
Est. average glucose Bld gHb Est-mCnc: 131 mg/dL
Hgb A1c MFr Bld: 6.2 % — ABNORMAL HIGH (ref 4.8–5.6)

## 2021-08-01 LAB — HEPATITIS C ANTIBODY: Hep C Virus Ab: NONREACTIVE

## 2021-08-11 ENCOUNTER — Telehealth: Payer: Self-pay

## 2021-08-11 NOTE — Telephone Encounter (Signed)
Please call on Abdiaziz phone # [phone]

## 2021-08-11 NOTE — Telephone Encounter (Signed)
Patient returning call about lab results.  

## 2021-08-12 NOTE — Telephone Encounter (Signed)
Pt came in office yesterday about results

## 2021-08-18 ENCOUNTER — Other Ambulatory Visit (HOSPITAL_COMMUNITY): Payer: Self-pay

## 2021-08-18 ENCOUNTER — Encounter (HOSPITAL_COMMUNITY): Payer: Self-pay

## 2021-08-18 DIAGNOSIS — Z87891 Personal history of nicotine dependence: Secondary | ICD-10-CM

## 2021-08-18 DIAGNOSIS — Z122 Encounter for screening for malignant neoplasm of respiratory organs: Secondary | ICD-10-CM

## 2021-08-18 NOTE — Progress Notes (Signed)
Order for LDCT placed per protocol ?

## 2021-08-18 NOTE — Progress Notes (Signed)
Received referral for initial lung cancer screening scan. Contacted patient and obtained smoking history (started age 61, current smoker continuing to smoke 1PPD, 43 pack year) as well as answering questions related to the screening process. Patient denies signs/symptoms of lung cancer such as weight loss or hemoptysis. Patient denies comorbidity that would prevent curative treatment if lung cancer were to be found. Patient is scheduled for shared decision making visit and CT scan on 09/02/2021 at 1100.   ?

## 2021-08-21 ENCOUNTER — Ambulatory Visit: Payer: BC Managed Care – PPO | Admitting: Cardiology

## 2021-08-21 ENCOUNTER — Ambulatory Visit (HOSPITAL_COMMUNITY)
Admission: RE | Admit: 2021-08-21 | Discharge: 2021-08-21 | Disposition: A | Discharge status: Home / Self Care | Payer: BC Managed Care – PPO | Source: Ambulatory Visit | Attending: Medical | Admitting: Medical

## 2021-08-21 ENCOUNTER — Ambulatory Visit (HOSPITAL_COMMUNITY): Admission: RE | Admit: 2021-08-21 | Payer: BC Managed Care – PPO | Source: Ambulatory Visit

## 2021-08-21 DIAGNOSIS — I5020 Unspecified systolic (congestive) heart failure: Secondary | ICD-10-CM | POA: Diagnosis not present

## 2021-08-21 LAB — ECHOCARDIOGRAM LIMITED
Calc EF: 54.1 %
S' Lateral: 5.15 cm
Single Plane A2C EF: 48.5 %
Single Plane A4C EF: 54.3 %

## 2021-08-21 NOTE — Progress Notes (Signed)
*  PRELIMINARY RESULTS* ?Echocardiogram ?2D Echocardiogram has been performed. ? ?Rodney Mahoney ?08/21/2021, 1:09 PM ?

## 2021-08-25 ENCOUNTER — Ambulatory Visit: Payer: BC Managed Care – PPO | Admitting: Medical

## 2021-09-01 NOTE — Progress Notes (Deleted)
RESCHEDULE 

## 2021-09-02 ENCOUNTER — Ambulatory Visit (HOSPITAL_COMMUNITY): Payer: BC Managed Care – PPO

## 2021-09-02 ENCOUNTER — Inpatient Hospital Stay (HOSPITAL_COMMUNITY): Payer: BC Managed Care – PPO | Admitting: Physician Assistant

## 2021-09-02 NOTE — Progress Notes (Deleted)
? ?Referring Provider:Paseda, Baird Kay, FNP ?Primary Care Physician:  Donell Beers, FNP ?Primary Gastroenterologist:  Dr. Jena Gauss ? ?No chief complaint on file. ? ? ?HPI:   ?Rodney Mahoney is a 61 y.o. male presenting today at the request of Paseda, Baird Kay, FNP for consult colonoscopy. ? ?Patient has history significant for HTN, HLD, CAD, NSTEMI in August 2019 s/p DES placement, initial reduction in EF, but most recent echocardiogram March 2023 with a EF improved to 50-55%. ? ?Today:  ? ?Prior colonoscopy:   ? ?Past Medical History:  ?Diagnosis Date  ? Bronchitis   ? CAD (coronary artery disease)   ? a. cath 11/26/2014 EF 45%, moderate to severe inferior hypokinesis, 90% small D1, 100% mid to distal RCA with minimal collateral, 40% mid to distal LCx. Medical therapy as RCA occlusion appears to be completed event; DES to mid circumflex October 2019  ? Essential hypertension   ? Hyperlipidemia   ? Ischemic cardiomyopathy   ? NSTEMI (non-ST elevated myocardial infarction) (HCC)   ? 11/26/14, 01/31/18  ? ? ?Past Surgical History:  ?Procedure Laterality Date  ? CARDIAC CATHETERIZATION N/A 11/26/2014  ? Procedure: Left Heart Cath and Coronary Angiography;  Surgeon: Peter M Swaziland, MD;  Location: [facility] INVASIVE CV LAB;  Service: Cardiovascular;  Laterality: N/A;  ? COLONOSCOPY    ? per patient: done in [place], age 31, couple of polyps.  ? CORONARY STENT INTERVENTION N/A 01/31/2018  ? Procedure: CORONARY STENT INTERVENTION;  Surgeon: Runell Gess, MD;  Location: [facility] INVASIVE CV LAB;  Service: Cardiovascular;  Laterality: N/A;  ? LEFT HEART CATH AND CORONARY ANGIOGRAPHY N/A 01/31/2018  ? Procedure: LEFT HEART CATH AND CORONARY ANGIOGRAPHY;  Surgeon: Runell Gess, MD;  Location: [facility] INVASIVE CV LAB;  Service: Cardiovascular;  Laterality: N/A;  ? NECK SURGERY  2005  ? ? ?Current Outpatient Medications  ?Medication Sig Dispense Refill  ? aspirin EC 81 MG tablet Take 1 tablet (81 mg total) by mouth daily. Swallow  whole. 90 tablet 3  ? atorvastatin (LIPITOR) 80 MG tablet Take 1 tablet (80 mg total) by mouth daily. TAKE 1 TABLET BY MOUTH EVERY DAY AT 6 PM 30 tablet 5  ? dapagliflozin propanediol (FARXIGA) 10 MG TABS tablet Take 1 tablet (10 mg total) by mouth daily before breakfast. 30 tablet 6  ? lisinopril (ZESTRIL) 5 MG tablet Take 1 tablet (5 mg total) by mouth daily. 90 tablet 3  ? metoprolol succinate (TOPROL-XL) 25 MG 24 hr tablet Take 1 tablet (25 mg total) by mouth daily. 90 tablet 3  ? nitroGLYCERIN (NITROSTAT) 0.4 MG SL tablet Place 1 tablet (0.4 mg total) under the tongue every 5 (five) minutes as needed. 25 tablet 0  ? ?No current facility-administered medications for this visit.  ? ? ?Allergies as of 09/04/2021  ? (No Known Allergies)  ? ? ?Family History  ?Problem Relation Age of Onset  ? Diabetes Mother   ? Heart attack Father   ? Cancer Sister   ? Hypertension Brother   ? Colon cancer Neg Hx   ? Prostate cancer Neg Hx   ? Lung cancer Neg Hx   ? ? ?Social History  ? ?Socioeconomic History  ? Marital status: Married  ?  Spouse name: Not on file  ? Number of children: Not on file  ? Years of education: Not on file  ? Highest education level: Not on file  ?Occupational History  ? Not on file  ?Tobacco Use  ? Smoking  status: Every Day  ?  Packs/day: 1.00  ?  Years: 42.00  ?  Pack years: 42.00  ?  Types: Cigarettes  ? Smokeless tobacco: Never  ? Tobacco comments:  ?  He used to quit and start back. He has not quit for the past 5 years.   ?Vaping Use  ? Vaping Use: Never used  ?Substance and Sexual Activity  ? Alcohol use: Yes  ?  Comment: 10 cans of beer  ? Drug use: Yes  ?  Types: Marijuana  ?  Comment: smokes week here and there  ? Sexual activity: Yes  ?  Birth control/protection: None  ?Other Topics Concern  ? Not on file  ?Social History Narrative  ? Works at [employer] and live with wife.   ? ?Social Determinants of Health  ? ?Financial Resource Strain: Not on file  ?Food Insecurity: Not on file   ?Transportation Needs: Not on file  ?Physical Activity: Not on file  ?Stress: Not on file  ?Social Connections: Not on file  ?Intimate Partner Violence: Not on file  ? ? ?Review of Systems: ?Gen: Denies any fever, chills, cold or flulike symptoms, presyncope, syncope. ?CV: Denies chest pain, heart palpitations. ?Resp: Denies shortness of breath or cough. ?GI: See HPI ?GU : Denies urinary burning, urinary frequency, urinary hesitancy ?MS: Denies joint pain ?Derm: Denies rash ?Psych: Denies anxiety or depression. ?Heme: See HPI ? ?Physical Exam: ?There were no vitals taken for this visit. ?General:   Alert and oriented. Pleasant and cooperative. Well-nourished and well-developed.  ?Head:  Normocephalic and atraumatic. ?Eyes:  Without icterus, sclera clear and conjunctiva pink.  ?Ears:  Normal auditory acuity. ?Lungs:  Clear to auscultation bilaterally. No wheezes, rales, or rhonchi. No distress.  ?Heart:  S1, S2 present without murmurs appreciated.  ?Abdomen:  +BS, soft, non-tender and non-distended. No HSM noted. No guarding or rebound. No masses appreciated.  ?Rectal:  Deferred  ?Msk:  Symmetrical without gross deformities. Normal posture. ?Extremities:  Without  edema. ?Neurologic:  Alert and  oriented x4;  grossly normal neurologically. ?Skin:  Intact without significant lesions or rashes. ?Psych:  Alert and cooperative. Normal mood and affect.  ? ? ? ?Assessment: ? ? ? ?Plan: ?*** ? ? ?Ermalinda Memos, PA-C ?[facility] ?09/04/2021 ? ?

## 2021-09-04 ENCOUNTER — Encounter: Payer: Self-pay | Admitting: Internal Medicine

## 2021-09-04 ENCOUNTER — Ambulatory Visit: Payer: BC Managed Care – PPO | Admitting: Gastroenterology

## 2021-09-08 ENCOUNTER — Encounter: Payer: Self-pay | Admitting: Internal Medicine

## 2021-10-20 ENCOUNTER — Encounter: Payer: Self-pay | Admitting: Cardiology

## 2021-10-20 ENCOUNTER — Ambulatory Visit (INDEPENDENT_AMBULATORY_CARE_PROVIDER_SITE_OTHER): Payer: BC Managed Care – PPO | Admitting: Cardiology

## 2021-10-20 VITALS — BP 146/78 | HR 90 | Ht 66.0 in | Wt 197.8 lb

## 2021-10-20 DIAGNOSIS — I25119 Atherosclerotic heart disease of native coronary artery with unspecified angina pectoris: Secondary | ICD-10-CM

## 2021-10-20 DIAGNOSIS — E782 Mixed hyperlipidemia: Secondary | ICD-10-CM | POA: Diagnosis not present

## 2021-10-20 DIAGNOSIS — I10 Essential (primary) hypertension: Secondary | ICD-10-CM

## 2021-10-20 MED ORDER — LISINOPRIL 10 MG PO TABS: 10.0000 mg | ORAL_TABLET | Freq: Every day | ORAL | 3 refills | Status: DC

## 2021-10-20 NOTE — Progress Notes (Signed)
? ? ?Cardiology Office Note ? ?Date: 10/20/2021  ? ?ID: Rodney Mahoney, DOB [birth year], MRN [id number] ? ?PCP:  Donell Beers, FNP  ?Cardiologist:  Nona Dell, MD ?Electrophysiologist:  None  ? ?Chief Complaint  ?Patient presents with  ? Cardiac follow-up  ? ? ?History of Present Illness: ?Rodney Mahoney is a 61 y.o. male last seen by Ms. Lorna Few in January.  He is here for a follow-up visit.  Reports no angina or nitroglycerin use, stable NYHA class II dyspnea.  Still working full-time at [employer]. ? ?Follow-up echocardiogram in March revealed LVEF improved to the range of 50 to 55% with basal inferior hypokinesis, no significant mitral regurgitation.  We discussed these results today. ? ?I reviewed his medications.  Plan to increase lisinopril to 10 mg daily, otherwise maintain stable regimen.  He will have lab work with PCP in the next month.  His last LDL was 64 on Lipitor. ? ?Past Medical History:  ?Diagnosis Date  ? Bronchitis   ? CAD (coronary artery disease)   ? a. cath 11/26/2014 EF 45%, moderate to severe inferior hypokinesis, 90% small D1, 100% mid to distal RCA with minimal collateral, 40% mid to distal LCx. Medical therapy as RCA occlusion appears to be completed event; DES to mid circumflex October 2019  ? Essential hypertension   ? Hyperlipidemia   ? Ischemic cardiomyopathy   ? NSTEMI (non-ST elevated myocardial infarction) (HCC)   ? 11/26/14, 01/31/18  ? ? ?Past Surgical History:  ?Procedure Laterality Date  ? CARDIAC CATHETERIZATION N/A 11/26/2014  ? Procedure: Left Heart Cath and Coronary Angiography;  Surgeon: Peter M Swaziland, MD;  Location: [facility] INVASIVE CV LAB;  Service: Cardiovascular;  Laterality: N/A;  ? COLONOSCOPY    ? per patient: done in [place], age 9, couple of polyps.  ? CORONARY STENT INTERVENTION N/A 01/31/2018  ? Procedure: CORONARY STENT INTERVENTION;  Surgeon: Runell Gess, MD;  Location: [facility] INVASIVE CV LAB;  Service: Cardiovascular;  Laterality: N/A;  ? LEFT  HEART CATH AND CORONARY ANGIOGRAPHY N/A 01/31/2018  ? Procedure: LEFT HEART CATH AND CORONARY ANGIOGRAPHY;  Surgeon: Runell Gess, MD;  Location: [facility] INVASIVE CV LAB;  Service: Cardiovascular;  Laterality: N/A;  ? NECK SURGERY  2005  ? ? ?Current Outpatient Medications  ?Medication Sig Dispense Refill  ? aspirin EC 81 MG tablet Take 1 tablet (81 mg total) by mouth daily. Swallow whole. 90 tablet 3  ? atorvastatin (LIPITOR) 80 MG tablet Take 1 tablet (80 mg total) by mouth daily. TAKE 1 TABLET BY MOUTH EVERY DAY AT 6 PM 30 tablet 5  ? dapagliflozin propanediol (FARXIGA) 10 MG TABS tablet Take 1 tablet (10 mg total) by mouth daily before breakfast. 30 tablet 6  ? lisinopril (ZESTRIL) 10 MG tablet Take 1 tablet (10 mg total) by mouth daily. 90 tablet 3  ? metoprolol succinate (TOPROL-XL) 25 MG 24 hr tablet Take 1 tablet (25 mg total) by mouth daily. 90 tablet 3  ? nitroGLYCERIN (NITROSTAT) 0.4 MG SL tablet Place 1 tablet (0.4 mg total) under the tongue every 5 (five) minutes as needed. 25 tablet 0  ? ?No current facility-administered medications for this visit.  ? ?Allergies:  Patient has no known allergies.  ? ?ROS: No palpitations or syncope. ? ?Physical Exam: ?VS:  BP (!) 146/78   Pulse 90   Ht 5\' 6"  (1.676 m)   Wt 197 lb 12.8 oz (89.7 kg)   SpO2 96%   BMI 31.93 kg/m? ,  BMI Body mass index is 31.93 kg/m?. ? ?Wt Readings from Last 3 Encounters:  ?10/20/21 197 lb 12.8 oz (89.7 kg)  ?07/31/21 193 lb 1.3 oz (87.6 kg)  ?06/27/21 201 lb (91.2 kg)  ?  ?General: Patient appears comfortable at rest. ?HEENT: Conjunctiva and lids normal, oropharynx clear. ?Neck: Supple, no elevated JVP or carotid bruits, no thyromegaly. ?Lungs: Clear to auscultation, nonlabored breathing at rest. ?Cardiac: Regular rate and rhythm, no S3 or significant systolic murmur, no pericardial rub. ?Extremities: No pitting edema. ? ?ECG:  An ECG dated 03/19/2021 was personally reviewed today and demonstrated:  Sinus rhythm with increased  voltage. ? ?Recent Labwork: ?03/19/2021: ALT 40; AST 35; B Natriuretic Peptide 46.0; Hemoglobin 13.7; Platelets 231 ?07/04/2021: BUN 15; Creat 0.78; Potassium 4.1; Sodium 140 ?07/31/2021: TSH 0.816  ?   ?Component Value Date/Time  ? CHOL 116 07/04/2021 1606  ? TRIG 66 07/04/2021 1606  ? HDL 37 (L) 07/04/2021 1606  ? CHOLHDL 3.1 07/04/2021 1606  ? LDLCALC 64 07/04/2021 1606  ? ? ?Other Studies Reviewed Today: ? ?Echocardiogram 08/21/2021: ? 1. Limited echo no color flow or doppler done.  ? 2. Inferior basal hypokinesis . Left ventricular ejection fraction, by  ?estimation, is 50 to 55%. The left ventricle has low normal function. The  ?left ventricle has no regional wall motion abnormalities.  ? 3. Right ventricular systolic function is normal. The right ventricular  ?size is normal.  ? 4. Left atrial size was mildly dilated.  ? 5. The mitral valve was not assessed. No evidence of mitral valve  ?regurgitation. No evidence of mitral stenosis.  ? 6. The aortic valve was not assessed. Aortic valve regurgitation is not  ?visualized. No aortic stenosis is present.  ? 7. The inferior vena cava is normal in size with greater than 50%  ?respiratory variability, suggesting right atrial pressure of 3 mmHg.  ? ?Assessment and Plan: ? ?1.  CAD status post DES to the mid circumflex in 2019 with chronically occluded RCA managed medically.  He reports no active angina or nitroglycerin use.  Continue aspirin, lisinopril, Farxiga, Toprol-XL, and Lipitor.  He has as needed nitroglycerin available. ? ?2.  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  Continue with current regimen. ? ?3.  Mixed hyperlipidemia, doing well on Lipitor with recent LDL 64. ? ?4.  Essential hypertension.  Increase lisinopril to 10 mg daily.  He will have follow-up lab work with PCP in June. ? ?Medication Adjustments/Labs and Tests Ordered: ?Current medicines are reviewed at length with the patient today.  Concerns regarding medicines are outlined  above.  ? ?Tests Ordered: ?No orders of the defined types were placed in this encounter. ? ? ?Medication Changes: ?Meds ordered this encounter  ?Medications  ? lisinopril (ZESTRIL) 10 MG tablet  ?  Sig: Take 1 tablet (10 mg total) by mouth daily.  ?  Dispense:  90 tablet  ?  Refill:  3  ?  10/20/21 dose increased to 10 mg qd  ? ? ?Disposition:  Follow up  6 months. ? ?Signed, ?[name], MD, [facility] ?10/20/2021 4:22 PM    ?[facility] ?[address] [place], [place] [address] ?Phone: [phone]; Fax: [phone]  ?

## 2021-10-20 NOTE — Patient Instructions (Signed)
Medication Instructions:  ?INCREASE Lisinopril to 10 mg daily ? ? ?Labwork: ?None today ? ?Testing/Procedures: ?None today ? ?Follow-Up: ?6 months ? ?Any Other Special Instructions Will Be Listed Below (If Applicable). ? ?If you need a refill on your cardiac medications before your next appointment, please call your pharmacy. ? ?

## 2021-10-29 ENCOUNTER — Encounter (HOSPITAL_COMMUNITY): Payer: Self-pay

## 2021-10-29 NOTE — Progress Notes (Signed)
Patient no showed for LDCT and SDMV. Unable to reach patient and no VM available. Referral closed at this time. ?

## 2021-11-05 ENCOUNTER — Ambulatory Visit: Payer: Self-pay

## 2021-11-05 ENCOUNTER — Telehealth: Payer: Self-pay | Admitting: *Deleted

## 2021-11-05 NOTE — Telephone Encounter (Signed)
Tried to call pt for 2:00 nurse visit by phone x 2 times.  Voice mail not set up.

## 2021-11-28 ENCOUNTER — Encounter: Payer: Self-pay | Admitting: Nurse Practitioner

## 2021-11-28 ENCOUNTER — Ambulatory Visit: Payer: BC Managed Care – PPO | Admitting: Nurse Practitioner

## 2021-11-28 VITALS — BP 126/78 | HR 75 | Ht 66.0 in | Wt 198.0 lb

## 2021-11-28 DIAGNOSIS — E782 Mixed hyperlipidemia: Secondary | ICD-10-CM

## 2021-11-28 DIAGNOSIS — I10 Essential (primary) hypertension: Secondary | ICD-10-CM | POA: Diagnosis not present

## 2021-11-28 DIAGNOSIS — Z720 Tobacco use: Secondary | ICD-10-CM | POA: Diagnosis not present

## 2021-11-28 DIAGNOSIS — Z1211 Encounter for screening for malignant neoplasm of colon: Secondary | ICD-10-CM

## 2021-11-28 DIAGNOSIS — N529 Male erectile dysfunction, unspecified: Secondary | ICD-10-CM | POA: Insufficient documentation

## 2021-11-28 DIAGNOSIS — M1990 Unspecified osteoarthritis, unspecified site: Secondary | ICD-10-CM

## 2021-11-28 DIAGNOSIS — Z23 Encounter for immunization: Secondary | ICD-10-CM | POA: Insufficient documentation

## 2021-11-28 MED ORDER — SILDENAFIL CITRATE 50 MG PO TABS: 50.0000 mg | ORAL_TABLET | Freq: Every day | ORAL | 0 refills | Status: DC | PRN

## 2021-11-28 NOTE — Assessment & Plan Note (Signed)
Chronic condition Patient encouraged to take Tylenol as needed for his pain states that he takes Tylenol occasionally because he does not want to take many medications.  Will refer to hand specialist at next follow-up he if he continues to have complaints of hand pain

## 2021-11-28 NOTE — Assessment & Plan Note (Signed)
Currently on atorvastatin 80 mg daily Check lipid panel Lab Results  Component Value Date   CHOL 116 07/04/2021   HDL 37 (L) 07/04/2021   LDLCALC 64 07/04/2021   TRIG 66 07/04/2021   CHOLHDL 3.1 07/04/2021

## 2021-11-28 NOTE — Progress Notes (Signed)
KYLIL SWOPES     MRN: [id number]      DOB: 12/20/1960   HPI Mr. Krol with past medical history of essential hypertension, arthritis, hyperlipidemia, tobacco abuse, CAD is here for follow up and re-evaluation of chronic medical conditions, medication management.   Current smoker. Smokes one pack of cigarettes daily, started smoking at age 61. Denies wheezing , SOB cough, CT Chest lung cancer screening pending   Erectile dysfunction .  Patient complained of ED during intercourse, never been on any medication for ED, denies urinary intensity, frequency, dysuria.   Hand arthritis.  Patient complains of chronic bilateral hand joint stiffness and pain, does a lot of movement with his hands at work takes Tylenol as needed for his pain has aching pain 3/10 denies fever, chills, warmth, redness  ROS Denies recent fever or chills. Denies sinus pressure, nasal congestion, ear pain or sore throat. Denies chest congestion, productive cough or wheezing. Denies chest pains, palpitations and leg swelling Denies abdominal pain, nausea, vomiting,diarrhea or constipation.   Denies dysuria, frequency, hesitancy or incontinence. Denies headaches, seizures, numbness, or tingling. Denies depression, anxiety or insomnia.    PE  BP 126/78 (BP Location: Right Arm, Patient Position: Sitting, Cuff Size: Large)   Pulse 75   Ht 5\' 6"  (1.676 m)   Wt 198 lb (89.8 kg)   SpO2 91%   BMI 31.96 kg/m   Patient alert and oriented and in no cardiopulmonary distress.  Chest: Clear to auscultation bilaterally.  CVS: S1, S2 no murmurs, no S3.Regular rate.  ABD: Soft non tender.   Ext: No edema  MS: Adequate ROM spine, shoulders, hips and knees,voiced tenderness on range of motion of the fingers  Skin: Intact, no ulcerations or rash noted.  Psych: Good eye contact, normal affect. Memory intact not anxious or depressed appearing.  CNS: CN 2-12 intact, power,  normal throughout.no focal deficits  noted.   Assessment & Plan  Essential hypertension BP Readings from Last 3 Encounters:  11/28/21 126/78  10/20/21 (!) 146/78  07/31/21 140/72  Chronic condition well-controlled on lisinopril 10 mg daily, metoprolol 25 mg daily, Farxiga 10 mg daily DASH diet advised engage in regular exercises at least 150 minutes weekly   Arthritis Chronic condition Patient encouraged to take Tylenol as needed for his pain states that he takes Tylenol occasionally because he does not want to take many medications.  Will refer to hand specialist at next follow-up he if he continues to have complaints of hand pain  Hyperlipidemia Currently on atorvastatin 80 mg daily Check lipid panel Lab Results  Component Value Date   CHOL 116 07/04/2021   HDL 37 (L) 07/04/2021   LDLCALC 64 07/04/2021   TRIG 66 07/04/2021   CHOLHDL 3.1 07/04/2021    Tobacco use Smokes about 1 pack/day  Asked about quitting: confirms that he/she currently smokes cigarettes Advise to quit smoking: Educated about QUITTING to reduce the risk of cancer, cardio and cerebrovascular disease. Assess willingness: Unwilling to quit at this time, but is working on cutting back. Assist with counseling and pharmacotherapy: Counseled for 5 minutes and literature provided. Arrange for follow up: follow up in 1 month and continue to offer help.  Need for varicella vaccine Patient educated on CDC recommendation for the shingles vaccine. Verbal consent was obtained from the patient, vaccine administered by nurse, no sign of adverse reactions noted at this time. Patient education on arm soreness and use of tylenol or ibuprofen  for this patient  was discussed.  Patient educated on the signs and symptoms of adverse effect and advise to contact the office if they occur.   Erectile dysfunction Rx sildenafil 50 mg daily as needed Patient told to take medication 30 minutes to 1 hour before planned sexual activity Patient told not to take Viagra at  the same time with nitroglycerin due to risk of hypotension

## 2021-11-28 NOTE — Assessment & Plan Note (Signed)
Rx sildenafil 50 mg daily as needed Patient told to take medication 30 minutes to 1 hour before planned sexual activity Patient told not to take Viagra at the same time with nitroglycerin due to risk of hypotension

## 2021-11-28 NOTE — Assessment & Plan Note (Signed)
Smokes about 1 pack/day  Asked about quitting: confirms that he/she currently smokes cigarettes Advise to quit smoking: Educated about QUITTING to reduce the risk of cancer, cardio and cerebrovascular disease. Assess willingness: Unwilling to quit at this time, but is working on cutting back. Assist with counseling and pharmacotherapy: Counseled for 5 minutes and literature provided. Arrange for follow up: follow up in 1 month and continue to offer help.  

## 2021-11-28 NOTE — Patient Instructions (Addendum)
Second dose of shingles vaccine today    Take viagra 50mg  as needed approximately 1 hour before anticipated sexual activity. Pleas do not take this medication with nitroglycerin .    It is important that you exercise regularly at least 30 minutes 5 times a week.  Think about what you will eat, plan ahead. Choose " clean, green, fresh or frozen" over canned, processed or packaged foods which are more sugary, salty and fatty. 70 to 75% of food eaten should be vegetables and fruit. Three meals at set times with snacks allowed between meals, but they must be fruit or vegetables. Aim to eat over a 12 hour period , example 7 am to 7 pm, and STOP after  your last meal of the day. Drink water,generally about 64 ounces per day, no other drink is as healthy. Fruit juice is best enjoyed in a healthy way, by EATING the fruit.  Thanks for choosing [facility], we consider it a privelige to serve you.

## 2021-11-28 NOTE — Assessment & Plan Note (Signed)
BP Readings from Last 3 Encounters:  11/28/21 126/78  10/20/21 (!) 146/78  07/31/21 140/72  Chronic condition well-controlled on lisinopril 10 mg daily, metoprolol 25 mg daily, Farxiga 10 mg daily DASH diet advised engage in regular exercises at least 150 minutes weekly

## 2021-11-28 NOTE — Assessment & Plan Note (Signed)
Patient educated on CDC recommendation for the shingles vaccine. Verbal consent was obtained from the patient, vaccine administered by nurse, no sign of adverse reactions noted at this time. Patient education on arm soreness and use of tylenol or ibuprofen  for this patient  was discussed. Patient educated on the signs and symptoms of adverse effect and advise to contact the office if they occur.  

## 2021-12-02 DIAGNOSIS — I10 Essential (primary) hypertension: Secondary | ICD-10-CM | POA: Diagnosis not present

## 2021-12-02 DIAGNOSIS — E782 Mixed hyperlipidemia: Secondary | ICD-10-CM | POA: Diagnosis not present

## 2021-12-03 ENCOUNTER — Other Ambulatory Visit: Payer: Self-pay | Admitting: Nurse Practitioner

## 2021-12-03 DIAGNOSIS — E782 Mixed hyperlipidemia: Secondary | ICD-10-CM

## 2021-12-03 LAB — CMP14+EGFR
ALT: 30 IU/L (ref 0–44)
AST: 24 IU/L (ref 0–40)
Albumin/Globulin Ratio: 1.9 (ref 1.2–2.2)
Albumin: 4.7 g/dL (ref 3.8–4.8)
Alkaline Phosphatase: 92 IU/L (ref 44–121)
BUN/Creatinine Ratio: 16 (ref 10–24)
BUN: 14 mg/dL (ref 8–27)
Bilirubin Total: 0.7 mg/dL (ref 0.0–1.2)
CO2: 21 mmol/L (ref 20–29)
Calcium: 9.5 mg/dL (ref 8.6–10.2)
Chloride: 101 mmol/L (ref 96–106)
Creatinine, Ser: 0.87 mg/dL (ref 0.76–1.27)
Globulin, Total: 2.5 g/dL (ref 1.5–4.5)
Glucose: 104 mg/dL — ABNORMAL HIGH (ref 70–99)
Potassium: 4.2 mmol/L (ref 3.5–5.2)
Sodium: 140 mmol/L (ref 134–144)
Total Protein: 7.2 g/dL (ref 6.0–8.5)
eGFR: 98 mL/min/{1.73_m2} (ref >59)

## 2021-12-03 LAB — LIPID PANEL
Chol/HDL Ratio: 3.5 ratio (ref 0.0–5.0)
Cholesterol, Total: 165 mg/dL (ref 100–199)
HDL: 47 mg/dL (ref >39)
LDL Chol Calc (NIH): 101 mg/dL — ABNORMAL HIGH (ref 0–99)
Triglycerides: 91 mg/dL (ref 0–149)
VLDL Cholesterol Cal: 17 mg/dL (ref 5–40)

## 2021-12-03 MED ORDER — EZETIMIBE 10 MG PO TABS: 10.0000 mg | ORAL_TABLET | Freq: Every day | ORAL | 3 refills | Status: DC

## 2021-12-03 NOTE — Progress Notes (Signed)
LDL not at goal of less than 55, start taking zetia 10mg  daily , continue atorvastatin 80mg  daily, avoid fried fatty foods  follow up in 8 weeks to recheck labs, pt should have fasting labs done 3-5 days before the appointment     The ASCVD Risk score (Arnett DK, et al., 2019) failed to calculate for the following reasons:   The patient has a prior MI or stroke diagnosis

## 2021-12-04 ENCOUNTER — Encounter: Payer: Self-pay | Admitting: *Deleted

## 2021-12-09 ENCOUNTER — Telehealth: Payer: Self-pay | Admitting: Nurse Practitioner

## 2022-01-20 ENCOUNTER — Other Ambulatory Visit: Payer: Self-pay | Admitting: Medical

## 2022-01-22 ENCOUNTER — Other Ambulatory Visit: Payer: Self-pay | Admitting: *Deleted

## 2022-02-03 ENCOUNTER — Ambulatory Visit: Payer: BC Managed Care – PPO | Admitting: Nurse Practitioner

## 2022-02-24 ENCOUNTER — Ambulatory Visit: Payer: BC Managed Care – PPO | Admitting: Nurse Practitioner

## 2022-02-24 ENCOUNTER — Encounter: Payer: Self-pay | Admitting: Nurse Practitioner

## 2022-02-24 VITALS — BP 132/70 | HR 81 | Resp 16 | Ht 66.0 in | Wt 194.8 lb

## 2022-02-24 DIAGNOSIS — M1990 Unspecified osteoarthritis, unspecified site: Secondary | ICD-10-CM | POA: Diagnosis not present

## 2022-02-24 DIAGNOSIS — E785 Hyperlipidemia, unspecified: Secondary | ICD-10-CM

## 2022-02-24 DIAGNOSIS — Z1211 Encounter for screening for malignant neoplasm of colon: Secondary | ICD-10-CM

## 2022-02-24 DIAGNOSIS — M791 Myalgia, unspecified site: Secondary | ICD-10-CM

## 2022-02-24 DIAGNOSIS — I10 Essential (primary) hypertension: Secondary | ICD-10-CM

## 2022-02-24 DIAGNOSIS — R7303 Prediabetes: Secondary | ICD-10-CM | POA: Insufficient documentation

## 2022-02-24 DIAGNOSIS — Z720 Tobacco use: Secondary | ICD-10-CM

## 2022-02-24 MED ORDER — DICLOFENAC SODIUM 1 % EX GEL: 2.0000 g | Freq: Four times a day (QID) | CUTANEOUS | 0 refills | Status: DC

## 2022-02-24 MED ORDER — CYCLOBENZAPRINE HCL 5 MG PO TABS: 5.0000 mg | ORAL_TABLET | Freq: Every day | ORAL | 0 refills | Status: DC

## 2022-02-24 NOTE — Assessment & Plan Note (Signed)
Lab Results  Component Value Date   HGBA1C 6.2 (H) 07/31/2021  Check A1c Avoid sugar sweets soda

## 2022-02-24 NOTE — Assessment & Plan Note (Signed)
Chronic condition Continue Tylenol 500 mg every 6 hours as needed voltaren gel 2 g up to 4 times daily as needed ordered Flexeril 5 mg at bedtime as needed ordered for muscle spasm. 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

## 2022-02-24 NOTE — Patient Instructions (Addendum)
Please take flexeril 5mg  as needed at bedtime to help relax your muscles, take tylenol 500mg  every 6 hours as needed , apply volatren gel 2g up to 4 times daily as needed to your hand arthritis     It is important that you exercise regularly at least 30 minutes 5 times a week.  Think about what you will eat, plan ahead. Choose " clean, green, fresh or frozen" over canned, processed or packaged foods which are more sugary, salty and fatty. 70 to 75% of food eaten should be vegetables and fruit. Three meals at set times with snacks allowed between meals, but they must be fruit or vegetables. Aim to eat over a 12 hour period , example 7 am to 7 pm, and STOP after  your last meal of the day. Drink water,generally about 64 ounces per day, no other drink is as healthy. Fruit juice is best enjoyed in a healthy way, by EATING the fruit.  Thanks for choosing [facility], we consider it a privelige to serve you.

## 2022-02-24 NOTE — Progress Notes (Signed)
Established Patient Office Visit  Subjective   Patient ID: Rodney Mahoney, male    DOB: 11-Jun-1961  Age: 61 y.o. MRN: [id number]  Chief Complaint  Patient presents with   Joint Swelling    States for months now his joints have been very sore and swollen and he aches all over. Was told before it may be arthritis but he doesn't feel like he should be hurting all the time    Rodney Mahoney with past medical history of hyperlipidemia, CAD, NSTEMI, diverticulitis, tobacco use is here for follow-up for hyperlipidemia  Hyperlipidemia.  Currently on Zetia 10 mg daily, atorvastatin 80 mg daily has been eating ham pizza. Patient complains of bilateral hand pain joints of the hand stiffness, does a lot of lifting and picking up at work.  Pain is worse when doing picking up and lifting.  Currently rates his pain as 5/10.  Takes Tylenol as needed.  Also has muscle aches on both arms and back muscles when lifting his arm up  to pick up things at work. He denies numbness, tingling, fever,   Due for colon cancer screening referral sent today  Review of Systems  Constitutional: Negative.  Negative for chills, fever and weight loss.  Respiratory: Negative.  Negative for cough, hemoptysis and sputum production.   Cardiovascular: Negative.  Negative for chest pain, palpitations and orthopnea.  Genitourinary: Negative.   Musculoskeletal:  Positive for joint pain. Negative for back pain, myalgias and neck pain.  Neurological: Negative.  Negative for dizziness.  Psychiatric/Behavioral: Negative.  Negative for depression, hallucinations, substance abuse and suicidal ideas.       Objective:     BP 132/70   Pulse 81   Resp 16   Ht 5\' 6"  (1.676 m)   Wt 194 lb 12.8 oz (88.4 kg)   SpO2 94%   BMI 31.44 kg/m    Physical Exam Constitutional:      General: He is not in acute distress.    Appearance: He is not ill-appearing, toxic-appearing or diaphoretic.  Cardiovascular:     Rate and Rhythm:  Normal rate and regular rhythm.     Pulses: Normal pulses.     Heart sounds: Normal heart sounds. No murmur heard.    No friction rub. No gallop.  Pulmonary:     Effort: Pulmonary effort is normal. No respiratory distress.     Breath sounds: Normal breath sounds. No stridor. No wheezing, rhonchi or rales.  Chest:     Chest wall: No tenderness.  Musculoskeletal:        General: No swelling, tenderness, deformity or signs of injury.     Right upper arm: No swelling, edema, deformity or tenderness.     Left upper arm: No swelling, edema, deformity or tenderness.     Right wrist: No swelling, deformity or effusion.     Left wrist: No swelling, deformity or effusion.     Right lower leg: No edema.     Left lower leg: No edema.     Comments: has Tenderness on ROM of the hand , no swelling redness or warmth noted  Skin:    General: Skin is warm and dry.     Findings: No bruising, erythema, lesion or rash.  Neurological:     Mental Status: He is alert and oriented to person, place, and time.     Cranial Nerves: No cranial nerve deficit.     Sensory: No sensory deficit.     Motor: No weakness.  Gait: Gait normal.  Psychiatric:        Mood and Affect: Mood normal.        Behavior: Behavior normal.        Thought Content: Thought content normal.        Judgment: Judgment normal.      No results found for any visits on 02/24/22.    The ASCVD Risk score (Arnett DK, et al., 2019) failed to calculate for the following reasons:   The patient has a prior MI or stroke diagnosis    Assessment & Plan:   Problem List Items Addressed This Visit       Cardiovascular and Mediastinum   Essential hypertension    BP Readings from Last 3 Encounters:  02/24/22 132/70  11/28/21 126/78  10/20/21 (!) 146/78  Chronic medical condition well-controlled On metoprolol 25 mg daily, lisinopril 10 mg daily, Farxiga 10 mg daily, continue current medication DASH diet advised engage in regular  moderate exercises at least 150 minutes weekly.  Appreciate collaboration with cardiology        Musculoskeletal and Integument   Arthritis - Primary    Chronic condition Continue Tylenol 500 mg every 6 hours as needed voltaren gel 2 g up to 4 times daily as needed ordered Flexeril 5 mg at bedtime as needed ordered for muscle spasm. 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 as it can make him drowsy      Relevant Medications   cyclobenzaprine (FLEXERIL) 5 MG tablet   diclofenac Sodium (VOLTAREN) 1 % GEL     Other   Hyperlipidemia    Lab Results  Component Value Date   LDLCALC 101 (H) 12/02/2021  LDL goal is less than 55 due to previous history of MI Currently on atorvastatin 80 mg daily, Zetia 10 mg daily Avoid fatty fried foods Check lipid panel      Relevant Orders   Lipid Profile   Tobacco use    Smokes about 1 pack/day  Asked about quitting: confirms that he/she currently smokes cigarettes Advise to quit smoking: Educated about QUITTING to reduce the risk of cancer, cardio and cerebrovascular disease. Assess willingness: Unwilling to quit at this time, but is working on cutting back. Assist with counseling and pharmacotherapy: Counseled for 5 minutes and literature provided. Arrange for follow up: follow up in 3 months and continue to offer help.       Screening for colon cancer   Relevant Orders   Ambulatory referral to Gastroenterology   Prediabetes    Lab Results  Component Value Date   HGBA1C 6.2 (H) 07/31/2021  Check A1c Avoid sugar sweets soda      Relevant Orders   HgB A1c   Muscle ache    On statin but I think his symptoms is due to the nature of his job  Will try flexeril 5mg  daily PRN at bedtime for muscle spasm        Return in about 6 months (around 08/25/2022) for HTN/HLD.10/25/2022, FNP

## 2022-02-24 NOTE — Assessment & Plan Note (Signed)
Lab Results  Component Value Date   LDLCALC 101 (H) 12/02/2021  LDL goal is less than 55 due to previous history of MI Currently on atorvastatin 80 mg daily, Zetia 10 mg daily Avoid fatty fried foods Check lipid panel

## 2022-02-24 NOTE — Assessment & Plan Note (Signed)
On statin but I think his symptoms is due to the nature of his job  Will try flexeril 5mg  daily PRN at bedtime for muscle spasm

## 2022-02-24 NOTE — Assessment & Plan Note (Signed)
Smokes about 1 pack/day  Asked about quitting: confirms that he/she currently smokes cigarettes Advise to quit smoking: Educated about QUITTING to reduce the risk of cancer, cardio and cerebrovascular disease. Assess willingness: Unwilling to quit at this time, but is working on cutting back. Assist with counseling and pharmacotherapy: Counseled for 5 minutes and literature provided. Arrange for follow up: follow up in 3 months and continue to offer help. 

## 2022-02-24 NOTE — Assessment & Plan Note (Addendum)
BP Readings from Last 3 Encounters:  02/24/22 132/70  11/28/21 126/78  10/20/21 (!) 146/78  Chronic medical condition well-controlled On metoprolol 25 mg daily, lisinopril 10 mg daily, Farxiga 10 mg daily, continue current medication DASH diet advised engage in regular moderate exercises at least 150 minutes weekly.  Appreciate collaboration with cardiology

## 2022-02-25 ENCOUNTER — Telehealth: Payer: Self-pay

## 2022-02-25 ENCOUNTER — Encounter: Payer: Self-pay | Admitting: *Deleted

## 2022-02-25 DIAGNOSIS — R7303 Prediabetes: Secondary | ICD-10-CM | POA: Diagnosis not present

## 2022-02-25 DIAGNOSIS — E785 Hyperlipidemia, unspecified: Secondary | ICD-10-CM | POA: Diagnosis not present

## 2022-02-25 NOTE — Telephone Encounter (Signed)
Please contact patient on which 4 medicines he is suppose to take he has 5 medicines, patient was just seen yesterday in the office. Call # [phone] cell and home # [phone].  Concern pharmacy says taking blood thinner, but no medicine to take for it.  He said he takes these 4 medicines  Metoprolol er 25 mg  Farxiga 63mb  Exetimibe 10 mg   Lisinopril 10 mg   Pharmacy: [facility]

## 2022-02-25 NOTE — Telephone Encounter (Signed)
Is this patient supposed to be on warfarin? Looks like he had it at the beginning of the year but not on his medlist anymore

## 2022-02-25 NOTE — Telephone Encounter (Signed)
Called pt [facility]  

## 2022-02-26 LAB — LIPID PANEL
Chol/HDL Ratio: 4.8 ratio (ref 0.0–5.0)
Cholesterol, Total: 186 mg/dL (ref 100–199)
HDL: 39 mg/dL — ABNORMAL LOW (ref >39)
LDL Chol Calc (NIH): 126 mg/dL — ABNORMAL HIGH (ref 0–99)
Triglycerides: 117 mg/dL (ref 0–149)
VLDL Cholesterol Cal: 21 mg/dL (ref 5–40)

## 2022-02-26 LAB — HEMOGLOBIN A1C
Est. average glucose Bld gHb Est-mCnc: 126 mg/dL
Hgb A1c MFr Bld: 6 % — ABNORMAL HIGH (ref 4.8–5.6)

## 2022-02-26 NOTE — Telephone Encounter (Signed)
Pt aware he is taking the correct meds

## 2022-03-31 ENCOUNTER — Telehealth: Payer: Self-pay

## 2022-03-31 ENCOUNTER — Other Ambulatory Visit: Payer: Self-pay

## 2022-03-31 DIAGNOSIS — N529 Male erectile dysfunction, unspecified: Secondary | ICD-10-CM

## 2022-03-31 MED ORDER — SILDENAFIL CITRATE 50 MG PO TABS: 50.0000 mg | ORAL_TABLET | Freq: Every day | ORAL | 0 refills | Status: DC | PRN

## 2022-03-31 NOTE — Telephone Encounter (Signed)
Patient need med refill  sildenafil (VIAGRA) 50 MG tablet [22837] sildenafil (VIAGRA) 50 MG tablet [[id number]]    Pharmacy: [facility]

## 2022-03-31 NOTE — Telephone Encounter (Signed)
Refills sent

## 2022-04-24 ENCOUNTER — Ambulatory Visit: Payer: BC Managed Care – PPO | Admitting: Cardiology

## 2022-04-30 ENCOUNTER — Ambulatory Visit: Payer: BC Managed Care – PPO | Admitting: Nurse Practitioner

## 2022-06-02 ENCOUNTER — Ambulatory Visit: Payer: BC Managed Care – PPO | Admitting: Internal Medicine

## 2022-06-02 ENCOUNTER — Encounter: Payer: Self-pay | Admitting: Internal Medicine

## 2022-06-02 VITALS — BP 138/77 | HR 75 | Resp 16 | Ht 66.0 in | Wt 186.0 lb

## 2022-06-02 DIAGNOSIS — M542 Cervicalgia: Secondary | ICD-10-CM | POA: Diagnosis not present

## 2022-06-02 MED ORDER — MELOXICAM 7.5 MG PO TABS: 7.5000 mg | ORAL_TABLET | Freq: Every day | ORAL | 0 refills | Status: DC

## 2022-06-02 MED ORDER — METHOCARBAMOL 750 MG PO TABS: 750.0000 mg | ORAL_TABLET | Freq: Three times a day (TID) | ORAL | 0 refills | Status: DC | PRN

## 2022-06-02 NOTE — Progress Notes (Signed)
     CC: neck pain  HPI:Mr.Rodney Mahoney is a 61 y.o. male who presents for evaluation of acute right sided neck pain. Neck pain started 1.5 months ago. Pain starts on right posterior side of his neck and radiates to his right shoulder. Patient has tried tylenol, advil, and ibuprofen and pain is slightly improved. Without these medications pain can reach a 10. No trauma to neck. Had surgery on left side of neck 15 years ago. No recurrence of pain since surgery.   Past Medical History:  Diagnosis Date   Bronchitis    CAD (coronary artery disease)    a. cath 11/26/2014 EF 45%, moderate to severe inferior hypokinesis, 90% small D1, 100% mid to distal RCA with minimal collateral, 40% mid to distal LCx. Medical therapy as RCA occlusion appears to be completed event; DES to mid circumflex October 2019   Essential hypertension    Hyperlipidemia    Ischemic cardiomyopathy    NSTEMI (non-ST elevated myocardial infarction) (HCC)    11/26/14, 01/31/18     Physical Exam: Vitals:   06/02/22 1627  BP: 138/77  Pulse: 75  Resp: 16  SpO2: 94%  Weight: 186 lb (84.4 kg)  Height: 5\' 6"  (1.676 m)     Physical Exam Neck:     Meningeal: Kernig's sign absent.  Cardiovascular:     Rate and Rhythm: Normal rate and regular rhythm.  Musculoskeletal:     Cervical back: No edema. Pain with movement and muscular tenderness present. No spinous process tenderness. Decreased range of motion (turning neck to right).     Comments: Bilateral radial Pulse 2+. Sensation and strength in upper extremities equal       Assessment & Plan:   Neck pain Acute neck pain,expect this is cervical muscle strain.  -methocarbamol (ROBAXIN-750) 750 MG tablet; Take 1 tablet (750 mg total) by mouth every 8 (eight) hours as needed for muscle spasms.  Dispense: 15 tablet; Refill: 0 - meloxicam (MOBIC) 7.5 MG tablet; Take 1 tablet (7.5 mg total) by mouth daily.  Dispense: 10 tablet; Refill: 0 -Follow up if no  improving    , MD

## 2022-06-02 NOTE — Assessment & Plan Note (Addendum)
Acute neck pain,expect this is cervical muscle strain.  -methocarbamol (ROBAXIN-750) 750 MG tablet; Take 1 tablet (750 mg total) by mouth every 8 (eight) hours as needed for muscle spasms.  Dispense: 15 tablet; Refill: 0 - meloxicam (MOBIC) 7.5 MG tablet; Take 1 tablet (7.5 mg total) by mouth daily.  Dispense: 10 tablet; Refill: 0 -Follow up if not improving

## 2022-06-02 NOTE — Patient Instructions (Addendum)
Thank you for trusting me with your care. To recap, today we discussed the following:  Neck pain - methocarbamol (ROBAXIN-750) 750 MG tablet; Take 1 tablet (750 mg total) by mouth every 8 (eight) hours as needed for muscle spasms.  Dispense: 15 tablet; Refill: 0 - meloxicam (MOBIC) 7.5 MG tablet; Take 1 tablet (7.5 mg total) by mouth daily.  Dispense: 10 tablet; Refill: 0   [place] [employer] and Health and safety inspector - Customer Service Center: [phone]

## 2022-06-05 ENCOUNTER — Other Ambulatory Visit: Payer: Self-pay

## 2022-06-05 DIAGNOSIS — Z1211 Encounter for screening for malignant neoplasm of colon: Secondary | ICD-10-CM

## 2022-06-26 ENCOUNTER — Other Ambulatory Visit: Payer: Self-pay | Admitting: Nurse Practitioner

## 2022-06-26 DIAGNOSIS — I10 Essential (primary) hypertension: Secondary | ICD-10-CM

## 2022-06-30 ENCOUNTER — Telehealth: Payer: Self-pay | Admitting: Internal Medicine

## 2022-06-30 ENCOUNTER — Encounter: Payer: Self-pay | Admitting: *Deleted

## 2022-06-30 ENCOUNTER — Other Ambulatory Visit: Payer: Self-pay

## 2022-06-30 DIAGNOSIS — Z1211 Encounter for screening for malignant neoplasm of colon: Secondary | ICD-10-CM

## 2022-06-30 NOTE — Telephone Encounter (Signed)
Patient would rather get colonoscopy so placed ordered.

## 2022-06-30 NOTE — Telephone Encounter (Signed)
Pt called wanting to speak to nurse about the home colon test?

## 2022-07-15 ENCOUNTER — Telehealth: Payer: Self-pay | Admitting: Internal Medicine

## 2022-07-15 ENCOUNTER — Other Ambulatory Visit: Payer: Self-pay

## 2022-07-15 DIAGNOSIS — N529 Male erectile dysfunction, unspecified: Secondary | ICD-10-CM

## 2022-07-15 MED ORDER — SILDENAFIL CITRATE 50 MG PO TABS: 50.0000 mg | ORAL_TABLET | Freq: Every day | ORAL | 0 refills | Status: DC | PRN

## 2022-07-15 NOTE — Telephone Encounter (Signed)
Patient need med refill  sildenafil (VIAGRA) 50 MG tablet [place], [place] - [address] [place], [place] [address] Phone: [phone]  Fax: [phone]

## 2022-07-15 NOTE — Telephone Encounter (Signed)
Refills sent to pharmacy. 

## 2022-07-28 ENCOUNTER — Ambulatory Visit: Payer: BC Managed Care – PPO | Admitting: Internal Medicine

## 2022-07-28 ENCOUNTER — Encounter: Payer: Self-pay | Admitting: Internal Medicine

## 2022-07-28 ENCOUNTER — Ambulatory Visit (HOSPITAL_COMMUNITY)
Admission: RE | Admit: 2022-07-28 | Discharge: 2022-07-28 | Disposition: A | Discharge status: Home / Self Care | Payer: BC Managed Care – PPO | Source: Ambulatory Visit | Attending: Internal Medicine | Admitting: Internal Medicine

## 2022-07-28 VITALS — BP 155/83 | HR 109 | Ht 66.0 in | Wt 182.8 lb

## 2022-07-28 DIAGNOSIS — M79641 Pain in right hand: Secondary | ICD-10-CM | POA: Diagnosis not present

## 2022-07-28 DIAGNOSIS — Z1331 Encounter for screening for depression: Secondary | ICD-10-CM | POA: Diagnosis not present

## 2022-07-28 DIAGNOSIS — M542 Cervicalgia: Secondary | ICD-10-CM | POA: Diagnosis not present

## 2022-07-28 DIAGNOSIS — M79642 Pain in left hand: Secondary | ICD-10-CM

## 2022-07-28 DIAGNOSIS — M19042 Primary osteoarthritis, left hand: Secondary | ICD-10-CM | POA: Diagnosis not present

## 2022-07-28 DIAGNOSIS — M19041 Primary osteoarthritis, right hand: Secondary | ICD-10-CM | POA: Diagnosis not present

## 2022-07-28 MED ORDER — CYCLOBENZAPRINE HCL 5 MG PO TABS: 5.0000 mg | ORAL_TABLET | Freq: Three times a day (TID) | ORAL | 1 refills | Status: DC | PRN

## 2022-07-28 NOTE — Progress Notes (Unsigned)
   Acute Office Visit  Subjective:     Patient ID: Rodney Mahoney, male    DOB: 02-Jul-1960, 62 y.o.   MRN: [id number]  Chief Complaint  Patient presents with   Neck Pain    Tingling on right side of neck and shoulder started on 03/29/2022.   Hand Pain    Been having pain in right hand since 08/04/2021.    HPI Patient is in today for ***  ROS      Objective:    BP (!) 155/83   Pulse (!) 109   Ht 5\' 6"  (1.676 m)   Wt 182 lb 12.8 oz (82.9 kg)   SpO2 94%   BMI 29.50 kg/m  {Vitals History (Optional):23777}  Physical Exam  No results found for any visits on 07/28/22.      Assessment & Plan:   Problem List Items Addressed This Visit   None   No orders of the defined types were placed in this encounter.   No follow-ups on file.  Johnette Abraham, MD

## 2022-07-28 NOTE — Patient Instructions (Signed)
It was a pleasure to see you today.  Thank you for giving [place] the opportunity to be involved in your care.  Below is a brief recap of your visit and next steps.  We will plan to see you again in March.  Summary I have prescribed flexeril for muscle spasms in your neck Referral placed to physical therapy Hand xrays have been ordered Please let me know if your neck pain is not improving and I will place a referral to orthopedic surgery

## 2022-07-29 ENCOUNTER — Encounter: Payer: Self-pay | Admitting: *Deleted

## 2022-08-03 ENCOUNTER — Telehealth (INDEPENDENT_AMBULATORY_CARE_PROVIDER_SITE_OTHER): Payer: Self-pay | Admitting: *Deleted

## 2022-08-03 DIAGNOSIS — Z1331 Encounter for screening for depression: Secondary | ICD-10-CM | POA: Insufficient documentation

## 2022-08-03 DIAGNOSIS — M79641 Pain in right hand: Secondary | ICD-10-CM | POA: Insufficient documentation

## 2022-08-03 DIAGNOSIS — M79642 Pain in left hand: Secondary | ICD-10-CM | POA: Insufficient documentation

## 2022-08-03 NOTE — Assessment & Plan Note (Signed)
He endorses a chronic history of bilateral hand pain and stiffness.  No obvious deformities or significant findings on exam today. -Bilateral hand x-rays been ordered

## 2022-08-03 NOTE — Telephone Encounter (Signed)
  Procedure: Colonoscopy  Estimated body mass index is 29.5 kg/m as calculated from the following:   Height as of 07/28/22: 5' 6"$  (1.676 m).   Weight as of 07/28/22: 182 lb 12.8 oz (82.9 kg).   Have you had a colonoscopy before? 10 years ago  Do you have family history of colon cancer?  Don't know  Do you have a family history of polyps? yes  Previous colonoscopy with polyps removed? Yes age 62  Do you have a history colorectal cancer?   no  Are you diabetic?  no  Do you have a prosthetic or mechanical heart valve? no  Do you have a pacemaker/defibrillator?   no  Have you had endocarditis/atrial fibrillation?  no  Do you use supplemental oxygen/CPAP?  no  Have you had joint replacement within the last 12 months?  no  Do you tend to be constipated or have to use laxatives?  no   Do you have history of alcohol use? If yes, how much and how often.  yes  Do you have history or are you using drugs? If yes, what do are you  using?  no  Have you ever had a stroke/heart attack?  5 and 10 years ago  Have you ever had a heart or other vascular stent placed,? 5 years ago  Do you take weight loss medication? no  Do you take any blood-thinning medications such as: (Plavix, aspirin, Coumadin, Aggrenox, Brilinta, Xarelto, Eliquis, Pradaxa, Savaysa or Effient)? no  If yes we need the name, milligram, dosage and who is prescribing doctor:               Current Outpatient Medications  Medication Sig Dispense Refill   aspirin EC 81 MG tablet Take 1 tablet (81 mg total) by mouth daily. Swallow whole. 90 tablet 3   atorvastatin (LIPITOR) 80 MG tablet Take 1 tablet (80 mg total) by mouth daily. TAKE 1 TABLET BY MOUTH EVERY DAY AT 6 PM 30 tablet 5   cyclobenzaprine (FLEXERIL) 5 MG tablet Take 1 tablet (5 mg total) by mouth 3 (three) times daily as needed for muscle spasms. 30 tablet 1   dapagliflozin propanediol (FARXIGA) 10 MG TABS tablet TAKE 1 TABLET BY MOUTH ONCE DAILY BEFORE BREAKFAST  90 tablet 3   ezetimibe (ZETIA) 10 MG tablet Take 1 tablet (10 mg total) by mouth daily. 90 tablet 3   lisinopril (ZESTRIL) 10 MG tablet Take 1 tablet (10 mg total) by mouth daily. 90 tablet 3   meloxicam (MOBIC) 7.5 MG tablet Take 1 tablet (7.5 mg total) by mouth daily. 10 tablet 0   metoprolol succinate (TOPROL-XL) 25 MG 24 hr tablet Take 1 tablet by mouth once daily 90 tablet 0   nitroGLYCERIN (NITROSTAT) 0.4 MG SL tablet Place 1 tablet (0.4 mg total) under the tongue every 5 (five) minutes as needed. 25 tablet 0   Potassium 99 MG TABS Take by mouth. One daily     sildenafil (VIAGRA) 50 MG tablet Take 1 tablet (50 mg total) by mouth daily as needed for erectile dysfunction. 10 tablet 0   No current facility-administered medications for this visit.    No Known Allergies

## 2022-08-03 NOTE — Assessment & Plan Note (Signed)
PHQ-9 mildly elevated today (11).  Denies SI/HI.  No prior history of depression.  He declines treatment today.

## 2022-08-03 NOTE — Assessment & Plan Note (Signed)
He returns to care today for evaluation of right-sided neck pain.  He has previously been evaluated for similar symptoms in December 2023 by Dr. Court Joy.  Meloxicam and Robaxin were prescribed at that time.  He continues to endorse right-sided neck pain with a tingling sensation radiating into the proximal aspect of the right upper extremity.  Positive Spurling's today.  There is tenderness to palpation over the right trapezius.  History and exam findings today seem most consistent with cervical radiculopathy. -Flexeril as been prescribed since this has previously been effective for symptom relief -PT referral placed for further evaluation and treatment

## 2022-08-04 NOTE — Telephone Encounter (Signed)
Called pt, no answer and VM full

## 2022-08-04 NOTE — Telephone Encounter (Signed)
ASA 3. Hold Farxiga 72 hours prior.

## 2022-08-19 ENCOUNTER — Encounter (INDEPENDENT_AMBULATORY_CARE_PROVIDER_SITE_OTHER): Payer: Self-pay | Admitting: *Deleted

## 2022-08-19 NOTE — Telephone Encounter (Signed)
Letter mailed

## 2022-08-25 ENCOUNTER — Encounter: Payer: Self-pay | Admitting: Internal Medicine

## 2022-08-25 ENCOUNTER — Ambulatory Visit: Payer: BC Managed Care – PPO | Admitting: Internal Medicine

## 2022-08-25 VITALS — BP 126/78 | HR 88 | Ht 66.0 in | Wt 184.2 lb

## 2022-08-25 DIAGNOSIS — Z1211 Encounter for screening for malignant neoplasm of colon: Secondary | ICD-10-CM

## 2022-08-25 DIAGNOSIS — M542 Cervicalgia: Secondary | ICD-10-CM

## 2022-08-25 DIAGNOSIS — E782 Mixed hyperlipidemia: Secondary | ICD-10-CM

## 2022-08-25 DIAGNOSIS — I2510 Atherosclerotic heart disease of native coronary artery without angina pectoris: Secondary | ICD-10-CM | POA: Diagnosis not present

## 2022-08-25 DIAGNOSIS — I5021 Acute systolic (congestive) heart failure: Secondary | ICD-10-CM | POA: Diagnosis not present

## 2022-08-25 DIAGNOSIS — I10 Essential (primary) hypertension: Secondary | ICD-10-CM

## 2022-08-25 MED ORDER — ATORVASTATIN CALCIUM 80 MG PO TABS: 80.0000 mg | ORAL_TABLET | Freq: Every day | ORAL | 1 refills | Status: DC

## 2022-08-25 MED ORDER — DAPAGLIFLOZIN PROPANEDIOL 10 MG PO TABS: 10.0000 mg | ORAL_TABLET | Freq: Every day | ORAL | 3 refills | Status: DC

## 2022-08-25 NOTE — Patient Instructions (Signed)
It was a pleasure to see you today.  Thank you for giving [place] the opportunity to be involved in your care.  Below is a brief recap of your visit and next steps.  We will plan to see you again in 3 months.  Summary Medications refilled today - resume atorvastatin CT scan ordered for your neck Follow up in 3 months

## 2022-08-25 NOTE — Assessment & Plan Note (Signed)
Denies recent pain.  He endorses compliance with ASA 81 mg daily, and Toprol-XL 25 mg daily.  When reviewing his medications and recent dispense report, he has not taken atorvastatin in almost 1 year.  He believes this medication was discontinued, but review of previous documentation suggests that his providers believed he was still taking atorvastatin. -Atorvastatin 80 mg has been refilled today.  Discussed that he has multiple indications for statin therapy, including a prior history of multiple MIs.

## 2022-08-25 NOTE — Assessment & Plan Note (Signed)
Lipid panel last updated in September 2023.  Total cholesterol 186 and LDL 126.  As noted above, he is currently prescribed atorvastatin 80 mg daily and ezetimibe 10 mg daily.  He has not been taking atorvastatin because he believes this medication was discontinued. -Resume atorvastatin 80 mg daily -Continue ezetimibe 10 mg daily -Repeat lipid panel at follow-up in 3 months

## 2022-08-25 NOTE — Assessment & Plan Note (Signed)
Continues to endorse right-sided neck pain with pain radiating distally to the right shoulder.  He will start physical therapy later this month.  Flexeril has been effective than alleviating his symptoms and allowing him to sleep at night.  He expresses concern about the persistence of his pain and is interested in additional workup/treatment. -Given the persistence of his symptoms with radicular symptoms, CT cervical spine without contrast ordered today -Start PT later this month -Additional management pending CT results

## 2022-08-25 NOTE — Assessment & Plan Note (Signed)
BP 126/78 today.  Currently well-controlled on lisinopril 10 mg daily, Toprol-XL 25 mg daily, and Farxiga 10 mg daily. -No medication changes today

## 2022-08-25 NOTE — Progress Notes (Signed)
Established Patient Office Visit  Subjective   Patient ID: Rodney Mahoney, male    DOB: 14-Aug-1960  Age: 62 y.o. MRN: [id number]  Chief Complaint  Patient presents with   Neck Pain    Patient is following up for neck pain, his neck is still giving him problems and would like a referral to see a specialist. Patient has experienced dizziness and light headedness especially when standing up to quickly.   Mr. Valleau returns to care today for routine follow-up.  He was last seen by me on 2/13 for persistent right-sided neck pain and bilateral hand pain.  Flexeril was prescribed and he was referred to physical therapy for further evaluation and management.  There have been no acute interval events.  Mr. Meulemans continues to endorse right-sided neck pain today.  He is interested in further workup or a referral to a specialist.  He will start physical therapy later this month.  Flexeril has been effective in helping him to sleep at night.  He has no additional concerns to discuss today.  Past Medical History:  Diagnosis Date   Bronchitis    CAD (coronary artery disease)    a. cath 11/26/2014 EF 45%, moderate to severe inferior hypokinesis, 90% small D1, 100% mid to distal RCA with minimal collateral, 40% mid to distal LCx. Medical therapy as RCA occlusion appears to be completed event; DES to mid circumflex October 2019   Essential hypertension    Hyperlipidemia    Ischemic cardiomyopathy    NSTEMI (non-ST elevated myocardial infarction) ([place])    11/26/14, 01/31/18   Past Surgical History:  Procedure Laterality Date   CARDIAC CATHETERIZATION N/A 11/26/2014   Procedure: Left Heart Cath and Coronary Angiography;  Surgeon: Peter M Martinique, MD;  Location: [facility];  Service: Cardiovascular;  Laterality: N/A;   COLONOSCOPY     per patient: done in [place], age 43, couple of polyps.   CORONARY STENT INTERVENTION N/A 01/31/2018   Procedure: CORONARY STENT INTERVENTION;  Surgeon: Lorretta Harp, MD;  Location: [facility];  Service: Cardiovascular;  Laterality: N/A;   LEFT HEART CATH AND CORONARY ANGIOGRAPHY N/A 01/31/2018   Procedure: LEFT HEART CATH AND CORONARY ANGIOGRAPHY;  Surgeon: Lorretta Harp, MD;  Location: [facility];  Service: Cardiovascular;  Laterality: N/A;   NECK SURGERY  2005   Social History   Tobacco Use   Smoking status: Every Day    Packs/day: 1.00    Years: 42.00    Total pack years: 42.00    Types: Cigarettes   Smokeless tobacco: Never   Tobacco comments:    He used to quit and start back. He has not quit for the past 5 years.   Vaping Use   Vaping Use: Never used  Substance Use Topics   Alcohol use: Yes    Comment: 10 cans of beer   Drug use: Yes    Types: Marijuana    Comment: smokes week here and there   Family History  Problem Relation Age of Onset   Diabetes Mother    Heart attack Father    Cancer Sister    Hypertension Brother    Colon cancer Neg Hx    Prostate cancer Neg Hx    Lung cancer Neg Hx    No Known Allergies  Review of Systems  Musculoskeletal:  Positive for back pain (Chronic low back pain) and neck pain (Right-sided neck pain).  All other systems reviewed and are  negative.    Objective:     BP 126/78 (BP Location: Right Arm, Patient Position: Sitting, Cuff Size: Large)   Pulse 88   Ht '5\' 6"'$  (1.676 m)   Wt 184 lb 3.2 oz (83.6 kg)   SpO2 93%   BMI 29.73 kg/m  BP Readings from Last 3 Encounters:  08/25/22 126/78  07/28/22 (!) 155/83  06/02/22 138/77   Physical Exam Vitals reviewed.  Constitutional:      General: He is not in acute distress.    Appearance: Normal appearance. He is not ill-appearing.  HENT:     Head: Normocephalic and atraumatic.     Right Ear: External ear normal.     Left Ear: External ear normal.     Nose: Nose normal. No congestion or rhinorrhea.     Mouth/Throat:     Mouth: Mucous membranes are moist.     Pharynx: Oropharynx is clear.  Eyes:      General: No scleral icterus.    Extraocular Movements: Extraocular movements intact.     Conjunctiva/sclera: Conjunctivae normal.     Pupils: Pupils are equal, round, and reactive to light.  Cardiovascular:     Rate and Rhythm: Normal rate and regular rhythm.     Pulses: Normal pulses.     Heart sounds: Normal heart sounds. No murmur heard. Pulmonary:     Effort: Pulmonary effort is normal.     Breath sounds: Normal breath sounds. No wheezing, rhonchi or rales.  Abdominal:     General: Abdomen is flat. Bowel sounds are normal. There is no distension.     Palpations: Abdomen is soft.     Tenderness: There is no abdominal tenderness.  Musculoskeletal:        General: Tenderness (TTP over the right trapezius) present. No swelling or deformity. Normal range of motion.     Cervical back: Normal range of motion.  Skin:    General: Skin is warm and dry.     Capillary Refill: Capillary refill takes less than 2 seconds.  Neurological:     General: No focal deficit present.     Mental Status: He is alert and oriented to person, place, and time.     Motor: No weakness.  Psychiatric:        Mood and Affect: Mood normal.        Behavior: Behavior normal.        Thought Content: Thought content normal.   Last CBC Lab Results  Component Value Date   WBC 7.6 03/19/2021   HGB 13.7 03/19/2021   HCT 40.9 03/19/2021   MCV 92.5 03/19/2021   MCH 31.0 03/19/2021   RDW 14.4 03/19/2021   PLT 231 [id number]   Last metabolic panel Lab Results  Component Value Date   GLUCOSE 104 (H) 12/02/2021   NA 140 12/02/2021   K 4.2 12/02/2021   CL 101 12/02/2021   CO2 21 12/02/2021   BUN 14 12/02/2021   CREATININE 0.87 12/02/2021   EGFR 98 12/02/2021   CALCIUM 9.5 12/02/2021   PROT 7.2 12/02/2021   ALBUMIN 4.7 12/02/2021   LABGLOB 2.5 12/02/2021   AGRATIO 1.9 12/02/2021   BILITOT 0.7 12/02/2021   ALKPHOS 92 12/02/2021   AST 24 12/02/2021   ALT 30 12/02/2021   ANIONGAP 7 03/19/2021   Last  lipids Lab Results  Component Value Date   CHOL 186 02/25/2022   HDL 39 (L) 02/25/2022   LDLCALC 126 (H) 02/25/2022   TRIG 117 02/25/2022  CHOLHDL 4.8 02/25/2022   Last hemoglobin A1c Lab Results  Component Value Date   HGBA1C 6.0 (H) 02/25/2022   Last thyroid functions Lab Results  Component Value Date   TSH 0.816 07/31/2021   Last vitamin D Lab Results  Component Value Date   VD25OH 65.7 07/31/2021     Assessment & Plan:   Problem List Items Addressed This Visit       Essential hypertension    BP 126/78 today.  Currently well-controlled on lisinopril 10 mg daily, Toprol-XL 25 mg daily, and Farxiga 10 mg daily. -No medication changes today      CAD (coronary artery disease), native coronary artery    Denies recent pain.  He endorses compliance with ASA 81 mg daily, and Toprol-XL 25 mg daily.  When reviewing his medications and recent dispense report, he has not taken atorvastatin in almost 1 year.  He believes this medication was discontinued, but review of previous documentation suggests that his providers believed he was still taking atorvastatin. -Atorvastatin 80 mg has been refilled today.  Discussed that he has multiple indications for statin therapy, including a prior history of multiple MIs.      Hyperlipidemia    Lipid panel last updated in September 2023.  Total cholesterol 186 and LDL 126.  As noted above, he is currently prescribed atorvastatin 80 mg daily and ezetimibe 10 mg daily.  He has not been taking atorvastatin because he believes this medication was discontinued. -Resume atorvastatin 80 mg daily -Continue ezetimibe 10 mg daily -Repeat lipid panel at follow-up in 3 months      Screening for colon cancer    Previously referred to gastroenterology for colon cancer screening.  Appears that he was contacted to complete a questionnaire and a letter was mailed to his house 1 week ago.  He does not regularly check his mail. -I recommended that he check  his mail to see if he has received a letter from gastroenterology regarding scheduling appointment for colonoscopy.      Neck pain on right side - Primary    Continues to endorse right-sided neck pain with pain radiating distally to the right shoulder.  He will start physical therapy later this month.  Flexeril has been effective than alleviating his symptoms and allowing him to sleep at night.  He expresses concern about the persistence of his pain and is interested in additional workup/treatment. -Given the persistence of his symptoms with radicular symptoms, CT cervical spine without contrast ordered today -Start PT later this month -Additional management pending CT results       Return in about 3 months (around 11/25/2022).    Johnette Abraham, MD

## 2022-08-25 NOTE — Assessment & Plan Note (Signed)
Previously referred to gastroenterology for colon cancer screening.  Appears that he was contacted to complete a questionnaire and a letter was mailed to his house 1 week ago.  He does not regularly check his mail. -I recommended that he check his mail to see if he has received a letter from gastroenterology regarding scheduling appointment for colonoscopy.

## 2022-08-27 ENCOUNTER — Telehealth: Payer: Self-pay | Admitting: Internal Medicine

## 2022-08-27 NOTE — Telephone Encounter (Signed)
Insurance denied cervical CT due to patient not having completed physical therapy. They state can be ordered if symptoms persist after therapy

## 2022-08-28 NOTE — Telephone Encounter (Signed)
Test will be cancelled and pt notified

## 2022-08-31 NOTE — Telephone Encounter (Signed)
Called pt, no answer, MB full.

## 2022-08-31 NOTE — Telephone Encounter (Signed)
Pt returned call

## 2022-09-01 ENCOUNTER — Telehealth: Payer: Self-pay | Admitting: Internal Medicine

## 2022-09-01 NOTE — Telephone Encounter (Signed)
Spoke with pt and pt aware.

## 2022-09-01 NOTE — Telephone Encounter (Signed)
Can you please call pt about referral to GI

## 2022-09-01 NOTE — Telephone Encounter (Signed)
Patient needs to call GI and speak with them about appt. Tried calling patient and no answer.

## 2022-09-02 NOTE — Therapy (Unsigned)
OUTPATIENT PHYSICAL THERAPY CERVICAL EVALUATION   Patient Name: Rodney Mahoney MRN: [id number] DOB:[birth year], 62 y.o., male Today's Date: 09/03/2022  END OF SESSION:  PT End of Session - 09/03/22 1650    Visit Number 1    Number of Visits 12    Date for PT Re-Evaluation 10/15/22    Authorization Type [employer]    Progress Note Due on Visit 10    PT Start Time 1610    PT Stop Time 1650    PT Time Calculation (min) 40 min    Activity Tolerance Patient tolerated treatment well    Behavior During Therapy [facility] for tasks assessed/performed             Past Medical History:  Diagnosis Date   Bronchitis    CAD (coronary artery disease)    a. cath 11/26/2014 EF 45%, moderate to severe inferior hypokinesis, 90% small D1, 100% mid to distal RCA with minimal collateral, 40% mid to distal LCx. Medical therapy as RCA occlusion appears to be completed event; DES to mid circumflex October 2019   Essential hypertension    Hyperlipidemia    Ischemic cardiomyopathy    NSTEMI (non-ST elevated myocardial infarction) ([place])    11/26/14, 01/31/18   Past Surgical History:  Procedure Laterality Date   CARDIAC CATHETERIZATION N/A 11/26/2014   Procedure: Left Heart Cath and Coronary Angiography;  Surgeon: Peter M Martinique, MD;  Location: [place] [facility] [facility];  Service: Cardiovascular;  Laterality: N/A;   COLONOSCOPY     per patient: done in [place], age 51, couple of polyps.   CORONARY STENT INTERVENTION N/A 01/31/2018   Procedure: CORONARY STENT INTERVENTION;  Surgeon: Lorretta Harp, MD;  Location: [facility] [facility];  Service: Cardiovascular;  Laterality: N/A;   LEFT HEART CATH AND CORONARY ANGIOGRAPHY N/A 01/31/2018   Procedure: LEFT HEART CATH AND CORONARY ANGIOGRAPHY;  Surgeon: Lorretta Harp, MD;  Location: [place] [facility];  Service: Cardiovascular;  Laterality: N/A;   NECK SURGERY  2005   Patient Active Problem List   Diagnosis Date Noted   Bilateral hand pain 08/03/2022   Positive  screening for depression on 9-item Patient Health Questionnaire (PHQ-9) 08/03/2022   Neck pain on right side 06/02/2022   Prediabetes 02/24/2022   Muscle ache 02/24/2022   Erectile dysfunction 11/28/2021   Need for varicella vaccine 11/28/2021   Annual physical exam 07/31/2021   Immunization due 07/31/2021   Arthritis 07/31/2021   Coronary artery disease involving native coronary artery of native heart without angina pectoris 06/02/2021   Screening for colon cancer 05/31/2021   Need for immunization against influenza 05/31/2021   Obesity (BMI 30-39.9) 05/31/2021   Tobacco use [id number]   Acute systolic heart failure ([place]) 02/02/2018   Non-STEMI (non-ST elevated myocardial infarction) ([place]) 01/31/2018   Diverticulitis 12/07/2014   Diverticulitis of large intestine with perforation with bleeding    CAD (coronary artery disease), native coronary artery 12/05/2014   Essential hypertension 11/28/2014   Hyperlipidemia    MR (mitral regurgitation)    NSTEMI (non-ST elevated myocardial infarction) ([place]) 11/26/2014    PCP: Marland Kitchen  REFERRING PROVIDER: Marland Kitchen  REFERRING DIAG: M54.2 (ICD-10-CM) - Neck pain on right side  THERAPY DIAG:  Cervicalgia   Rationale for Evaluation and Treatment: Rehabilitation  ONSET DATE: 06/02/2022  SUBJECTIVE STATEMENT: Rodney Mahoney states that he woke up in August with neck pain.  He states he thought it would go away but it just got worse.  He states that the pain goes into his Rt shoulder area.  He is unable to close his Rt hand which his MD states is due to OA.  He states that he has noted weakness in his UE.   PERTINENT HISTORY:  See above  PAIN:  Are you having pain? Yes: NPRS scale: 8-9/10 Pain location: Rt cervical  Pain  description: throbbing and burning  Aggravating factors: not sure  Relieving factors: advil or tylenol   PRECAUTIONS: None  WEIGHT BEARING RESTRICTIONS: No  FALLS:  Has patient fallen in last 6 months? No  LIVING ENVIRONMENT: Lives with: lives with their family Lives in: House/apartment  OCCUPATION: Pt states that he does not feel the pain when he works.  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  PLOF: Independent  PATIENT GOALS: less pain and to be able to turn his head better   NEXT MD VISIT: after therapy   OBJECTIVE:   DIAGNOSTIC FINDINGS:  None, MRI scheduled  PATIENT SURVEYS:  FOTO 42  COGNITION: Overall cognitive status: Within functional limits for tasks assessed  POSTURE: rounded shoulders and decreased thoracic kyphosis  PALPATION: Tight mm    CERVICAL ROM:   Active ROM A/PROM (deg) eval  Flexion 35 reps no change of sx   Extension 35 reps no change of sx   Right lateral flexion   Left lateral flexion   Right rotation 38  Left rotation 45   (Blank rows = not tested) Cervical isometric B side bend 2+; extension 3- UPPER EXTREMITY ROM: difficult but able to complete full ROM with encouragement.   UPPER EXTREMITY MMT: B generally 4/5 for shoulder and elbow mm, grip strength; Rt: 45#; Lt:64# Fast exchange: Rt 60#, Lt 75#   TODAY'S TREATMENT:                                                                                                                              DATE:  10/04/22:  Evaluation:                Pt given putty for hand grip.                Supine: Scapular and cervical retraction x 10                 Cervical rotation x10  PATIENT EDUCATION:  Education details: HEP Person educated: Patient Education method: Explanation and Handouts Education comprehension: verbalized understanding and returned demonstration  HOME EXERCISE PROGRAM: Access Code: [email or web address] URL: [email or web address] Date:  09/03/2022 Prepared by: Rayetta Humphrey  Exercises - Supine Chin Tuck  - 2 x daily - 7 x weekly - 1 sets - 10 reps - 3" hold - Supine Scapular Retraction  - 2 x daily - 7 x  weekly - 1 sets - 10 reps - 3" hold - Supine Cervical Rotation AROM on Pillow  - 2 x daily - 7 x weekly - 1 sets - 10 reps - 3" hold - Putty Squeezes  - 2 x daily - 7 x weekly - 1 sets - 10 reps - 3" hold  ASSESSMENT:  CLINICAL IMPRESSION: Patient is a 62 y.o. male who was seen today for physical therapy evaluation and treatment for cervical pain. Evaluation demonstrates decreased ROM, decreased strength, postural changes and increased pain.  Mr. Hazzard will benefit from skilled PT to address these issues and maximize his functional ability.  OBJECTIVE IMPAIRMENTS: decreased ROM, decreased strength, hypomobility, increased muscle spasms, postural dysfunction, and pain.   ACTIVITY LIMITATIONS: carrying and lifting  PARTICIPATION LIMITATIONS: driving and yard work  PERSONAL FACTORS: Fitness and Time since onset of injury/illness/exacerbation are also affecting patient's functional outcome.   REHAB POTENTIAL: Fair    CLINICAL DECISION MAKING: Stable/uncomplicated  EVALUATION COMPLEXITY: Low   GOALS: Goals reviewed with patient? No  SHORT TERM GOALS: Target date: 09/24/22  Pt to be I in Hep in order to decrease pain to no greater than a  Baseline:  Goal status: INITIAL  2.  PT to improve his rotation by 10 degrees for improved safety in driving  Baseline:  Goal status: INITIAL      LONG TERM GOALS: Target date: 10/15/22  Pt to be I in an advanced  Hep in order to decrease pain to no greater than a  Baseline:  Goal status: INITIAL  2.   PT to improve his rotation by 20 degrees for improved safety in driving Goal status: INITIAL  3.  Pt to no longer have any radicular sx to demonstrate decreased nerve irritation   Goal status: INITIAL  4.  PT UE strength to be at least 4+/5 to improve his  functional ability  Goal status: INITIAL   PLAN:  PT FREQUENCY: 2x/week  PT DURATION: 6 weeks  PLANNED INTERVENTIONS: Therapeutic exercises, Patient/Family education, Self Care, Joint mobilization, and Manual therapy  PLAN FOR NEXT SESSION: begin cervical and thoracic excursions progress mobility and stability.  Try manual to decrease sx.   Rayetta Humphrey, [place] CLT [phone]

## 2022-09-03 ENCOUNTER — Encounter: Payer: Self-pay | Admitting: *Deleted

## 2022-09-03 ENCOUNTER — Ambulatory Visit (HOSPITAL_COMMUNITY): Payer: BC Managed Care – PPO | Attending: Internal Medicine | Admitting: Physical Therapy

## 2022-09-03 ENCOUNTER — Other Ambulatory Visit: Payer: Self-pay

## 2022-09-03 ENCOUNTER — Other Ambulatory Visit: Payer: Self-pay | Admitting: *Deleted

## 2022-09-03 DIAGNOSIS — M542 Cervicalgia: Secondary | ICD-10-CM | POA: Diagnosis not present

## 2022-09-03 MED ORDER — PEG 3350-KCL-NA BICARB-NACL 420 G PO SOLR: 4000.0000 mL | Freq: Once | ORAL | 0 refills | Status: AC

## 2022-09-03 NOTE — Telephone Encounter (Signed)
Pt has been scheduled for 10/08/22 with Dr.Carver. Instructions printed and given to pt. Prep sent to the pharmacy

## 2022-09-04 NOTE — Telephone Encounter (Signed)
Referral completed

## 2022-09-07 ENCOUNTER — Encounter: Payer: Self-pay | Admitting: *Deleted

## 2022-09-21 ENCOUNTER — Telehealth (HOSPITAL_COMMUNITY): Payer: Self-pay

## 2022-09-21 ENCOUNTER — Encounter (HOSPITAL_COMMUNITY): Payer: BC Managed Care – PPO

## 2022-09-21 NOTE — Telephone Encounter (Signed)
No show called and spoke to patient who stated he had forgotten about therapy today. Reminded next apt date and time.   Becky Sax, LPTA/CLT; [place] [phone]

## 2022-09-24 ENCOUNTER — Encounter (HOSPITAL_COMMUNITY): Payer: Self-pay

## 2022-09-24 ENCOUNTER — Ambulatory Visit (HOSPITAL_COMMUNITY): Payer: BC Managed Care – PPO | Attending: Internal Medicine

## 2022-09-24 DIAGNOSIS — M542 Cervicalgia: Secondary | ICD-10-CM | POA: Insufficient documentation

## 2022-09-24 NOTE — Therapy (Signed)
OUTPATIENT PHYSICAL THERAPY CERVICAL TREATMENT   Patient Name: Rodney Mahoney MRN: [id number] DOB:1960-12-27, 62 y.o., male Today's Date: 09/24/2022  END OF SESSION: END OF SESSION:   PT End of Session - 09/24/22 1007     Visit Number 2    Number of Visits 12    Date for PT Re-Evaluation 10/15/22    Authorization Type BCBS    Progress Note Due on Visit 10    PT Start Time 1005    PT Stop Time 1045    PT Time Calculation (min) 40 min    Activity Tolerance Patient tolerated treatment well    Behavior During Therapy WFL for tasks assessed/performed             Past Medical History:  Diagnosis Date   Bronchitis    CAD (coronary artery disease)    a. cath 11/26/2014 EF 45%, moderate to severe inferior hypokinesis, 90% small D1, 100% mid to distal RCA with minimal collateral, 40% mid to distal LCx. Medical therapy as RCA occlusion appears to be completed event; DES to mid circumflex October 2019   Essential hypertension    Hyperlipidemia    Ischemic cardiomyopathy    NSTEMI (non-ST elevated myocardial infarction)    11/26/14, 01/31/18   Past Surgical History:  Procedure Laterality Date   CARDIAC CATHETERIZATION N/A 11/26/2014   Procedure: Left Heart Cath and Coronary Angiography;  Surgeon: Rodney M SwazilandJordan, MD;  [facility];  Service: Cardiovascular;  Laterality: N/A;   COLONOSCOPY     per patient: done in [place], age 62, couple of polyps.   CORONARY STENT INTERVENTION N/A 01/31/2018   Procedure: CORONARY STENT INTERVENTION;  Surgeon: Rodney Mahoney, Rodney J, MD;  Location: [facility] INVASIVE CV LAB;  Service: Cardiovascular;  Laterality: N/A;   LEFT HEART CATH AND CORONARY ANGIOGRAPHY N/A 01/31/2018   Procedure: LEFT HEART CATH AND CORONARY ANGIOGRAPHY;  Surgeon: Rodney Mahoney, Rodney J, MD;  Location: [facility] INVASIVE CV LAB;  Service: Cardiovascular;  Laterality: N/A;   NECK SURGERY  2005   Patient Active Problem List   Diagnosis Date Noted   Bilateral hand pain 08/03/2022    Positive screening for depression on 9-item Patient Health Questionnaire (PHQ-9) 08/03/2022   Neck pain on right side 06/02/2022   Prediabetes 02/24/2022   Muscle ache 02/24/2022   Erectile dysfunction 11/28/2021   Need for varicella vaccine 11/28/2021   Annual physical exam 07/31/2021   Immunization due 07/31/2021   Arthritis 07/31/2021   Coronary artery disease involving native coronary artery of native heart without angina pectoris 06/02/2021   Screening for colon cancer 05/31/2021   Need for immunization against influenza 05/31/2021   Obesity (BMI 30-39.9) 05/31/2021   Tobacco use 02/02/2018   Acute systolic heart failure 02/02/2018   Non-STEMI (non-ST elevated myocardial infarction) 01/31/2018   Diverticulitis 12/07/2014   Diverticulitis of large intestine with perforation with bleeding    CAD (coronary artery disease), native coronary artery 12/05/2014   Essential hypertension 11/28/2014   Hyperlipidemia    MR (mitral regurgitation)    NSTEMI (non-ST elevated myocardial infarction) (HCC) 11/26/2014    PCP: Rodney Mahoney  REFERRING PROVIDER: Christel MormonPhillip Mahoney Next apt: every 3 months  REFERRING DIAG: M54.2 (ICD-10-CM) - Neck pain on right side  THERAPY DIAG:  Cervicalgia   Rationale for Evaluation and Treatment: Rehabilitation  ONSET DATE: 06/02/2022  SUBJECTIVE STATEMENT: Pt stated he has constant pain in Rt shoulder and chest, feels therapy will not be helpful at this point and wishes to MD would take further testing.  Feels the symptoms are spreading from shoulder to chest.  Eval:  Rodney Mahoney states that he woke up in August with neck pain.  He states he thought it would go away but it just got worse.  He states that the pain goes into his Rt shoulder area.  He is  unable to close his Rt hand which his MD states is due to OA.  He states that he has noted weakness in his UE.   PERTINENT HISTORY:  See above  PAIN:  Are you having pain? Yes: NPRS scale: 8-9/10 Pain location: Rt cervical  Pain description: throbbing and burning  Aggravating factors: not sure  Relieving factors: advil or tylenol   PRECAUTIONS: None  WEIGHT BEARING RESTRICTIONS: No  FALLS:  Has patient fallen in last 6 months? No  LIVING ENVIRONMENT: Lives with: lives with their family Lives in: House/apartment  OCCUPATION: Pt states that he does not feel the pain when he works.  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  PATIENT GOALS: less pain and to be able to turn his head better   NEXT MD VISIT: after therapy   OBJECTIVE:   DIAGNOSTIC FINDINGS:  None, MRI scheduled  PATIENT SURVEYS:  FOTO 42  COGNITION: Overall cognitive status: Within functional limits for tasks assessed  POSTURE: rounded shoulders and decreased thoracic kyphosis  PALPATION: Tight mm    CERVICAL ROM:   Active ROM A/PROM (deg) eval  Flexion 35 reps no change of sx   Extension 35 reps no change of sx   Right lateral flexion   Left lateral flexion   Right rotation 38  Left rotation 45   (Blank rows = not tested) Cervical isometric B side bend 2+; extension 3- UPPER EXTREMITY ROM: difficult but able to complete full ROM with encouragement.   UPPER EXTREMITY MMT: B generally 4/5 for shoulder and elbow mm, grip strength; Rt: 45#; Lt:64# Fast exchange: Rt 60#, Lt 75#   TODAY'S TREATMENT:                                                                                                                              DATE:  09/24/22: Reviewed goals  Purpose of therapy  Importance of posture Seated: chin tuck  Scapular retraction 3D thoracic excursion Supine manual STM to upper trap, scalenes and cervical traction  10/04/22:  Evaluation:                 Pt given putty for hand grip.                Supine: Scapular and cervical retraction x 10                 Cervical rotation x10  PATIENT EDUCATION:  Education details: HEP Person educated: Patient Education method: Chief Technology Officer Education comprehension: verbalized understanding and returned demonstration  HOME EXERCISE PROGRAM: Access Code: [email or web address] URL: [email or web address] Date: 09/03/2022 Prepared by: Virgina Organ  Exercises - Supine Chin Tuck  - 2 x daily - 7 x weekly - 1 sets - 10 reps - 3" hold - Supine Scapular Retraction  - 2 x daily - 7 x weekly - 1 sets - 10 reps - 3" hold - Supine Cervical Rotation AROM on Pillow  - 2 x daily - 7 x weekly - 1 sets - 10 reps - 3" hold - Putty Squeezes  - 2 x daily - 7 x weekly - 1 sets - 10 reps - 3" hold  ASSESSMENT:  CLINICAL IMPRESSION: Reviewed goals and explained purpose of therapy.  Pt stated he feels therapy will not be helpful and wishes MD would do further testing.  Session focus on cervical/thoracic mobility and postural strengthening.  Pt limited by pain with majority of movements, monitored through session.  Reviewed current HEP as pt stated he recalls the rotation exercises only.  Educated importance of HEP compliance for maximal benefits, printout given.  EOS with manual STM to address upper traps and periscapular tightness, reports of pain reduced following STM and cervical traction.  OBJECTIVE IMPAIRMENTS: decreased ROM, decreased strength, hypomobility, increased muscle spasms, postural dysfunction, and pain.   ACTIVITY LIMITATIONS: carrying and lifting  PARTICIPATION LIMITATIONS: driving and yard work  PERSONAL FACTORS: Fitness and Time since onset of injury/illness/exacerbation are also affecting patient's functional outcome.   REHAB POTENTIAL: Fair    CLINICAL DECISION MAKING: Stable/uncomplicated  EVALUATION COMPLEXITY: Low   GOALS: Goals reviewed with patient? No  SHORT  TERM GOALS: Target date: 09/24/22  Pt to be I in Hep in order to decrease pain to no greater than a  Baseline:  Goal status: IN PROGRESS  2.  PT to improve his rotation by 10 degrees for improved safety in driving  Baseline:  Goal status: IN PROGRESS      LONG TERM GOALS: Target date: 10/15/22  Pt to be I in an advanced  Hep in order to decrease pain to no greater than a  Baseline:  Goal status: IN PROGRESS  2.   PT to improve his rotation by 20 degrees for improved safety in driving Goal status: IN PROGRESS  3.  Pt to no longer have any radicular sx to demonstrate decreased nerve irritation   Goal status: IN PROGRESS  4.  PT UE strength to be at least 4+/5 to improve his functional ability  Goal status: IN PROGRESS   PLAN:  PT FREQUENCY: 2x/week  PT DURATION: 6 weeks  PLANNED INTERVENTIONS: Therapeutic exercises, Patient/Family education, Self Care, Joint mobilization, and Manual therapy  PLAN FOR NEXT SESSION: begin cervical and thoracic excursions progress mobility and stability.  Try manual to decrease sx. F/U with current HEP compliance.   Becky Sax, LPTA/CLT; Rowe Clack [phone]

## 2022-09-28 ENCOUNTER — Encounter (HOSPITAL_COMMUNITY): Payer: BC Managed Care – PPO

## 2022-09-28 ENCOUNTER — Telehealth (HOSPITAL_COMMUNITY): Payer: Self-pay

## 2022-09-28 NOTE — Telephone Encounter (Signed)
No show, called with no answer and unable to leave message as mailbox is full concerning missed apt today.   Becky Sax, LPTA/CLT; Rowe Clack [phone]

## 2022-09-29 ENCOUNTER — Encounter (HOSPITAL_COMMUNITY): Payer: BC Managed Care – PPO | Admitting: Physical Therapy

## 2022-09-29 ENCOUNTER — Other Ambulatory Visit: Payer: Self-pay | Admitting: Internal Medicine

## 2022-09-29 DIAGNOSIS — N529 Male erectile dysfunction, unspecified: Secondary | ICD-10-CM

## 2022-09-29 DIAGNOSIS — I10 Essential (primary) hypertension: Secondary | ICD-10-CM

## 2022-10-01 ENCOUNTER — Ambulatory Visit (HOSPITAL_COMMUNITY): Payer: BC Managed Care – PPO

## 2022-10-01 ENCOUNTER — Encounter (HOSPITAL_COMMUNITY): Payer: Self-pay

## 2022-10-01 DIAGNOSIS — M542 Cervicalgia: Secondary | ICD-10-CM | POA: Diagnosis not present

## 2022-10-01 NOTE — Therapy (Addendum)
OUTPATIENT PHYSICAL THERAPY CERVICAL TREATMENT   Patient Name: Rodney Mahoney MRN: [id number] DOB:1961/05/15, 62 y.o., male Today's Date: 10/01/2022  END OF SESSION: END OF SESSION:      10/01/22 1445  PT Visits / Re-Eval  Visit Number 3  Number of Visits 12  Date for PT Re-Evaluation 10/15/22  Authorization  Authorization Type BCBS  Progress Note Due on Visit 10  PT Time Calculation  PT Start Time 1445  PT Stop Time 1525  PT Time Calculation (min) 40 min  PT - End of Session  Activity Tolerance Patient limited by pain;Patient tolerated treatment well  Behavior During Therapy [facility] for tasks assessed/performed    Past Medical History:  Diagnosis Date   Bronchitis    CAD (coronary artery disease)    a. cath 11/26/2014 EF 45%, moderate to severe inferior hypokinesis, 90% small D1, 100% mid to distal RCA with minimal collateral, 40% mid to distal LCx. Medical therapy as RCA occlusion appears to be completed event; DES to mid circumflex October 2019   Essential hypertension    Hyperlipidemia    Ischemic cardiomyopathy    NSTEMI (non-ST elevated myocardial infarction)    11/26/14, 01/31/18   Past Surgical History:  Procedure Laterality Date   CARDIAC CATHETERIZATION N/A 11/26/2014   Procedure: Left Heart Cath and Coronary Angiography;  Surgeon: Peter M Swaziland, MD;  Location: [facility] INVASIVE CV LAB;  Service: Cardiovascular;  Laterality: N/A;   COLONOSCOPY     per patient: done in [place], age 80, couple of polyps.   CORONARY STENT INTERVENTION N/A 01/31/2018   Procedure: CORONARY STENT INTERVENTION;  Surgeon: Runell Gess, MD;  Location: [facility] INVASIVE CV LAB;  Service: Cardiovascular;  Laterality: N/A;   LEFT HEART CATH AND CORONARY ANGIOGRAPHY N/A 01/31/2018   Procedure: LEFT HEART CATH AND CORONARY ANGIOGRAPHY;  Surgeon: Runell Gess, MD;  Location: [facility] INVASIVE CV LAB;  Service: Cardiovascular;  Laterality: N/A;   NECK SURGERY  2005   Patient Active Problem List    Diagnosis Date Noted   Bilateral hand pain 08/03/2022   Positive screening for depression on 9-item Patient Health Questionnaire (PHQ-9) 08/03/2022   Neck pain on right side 06/02/2022   Prediabetes 02/24/2022   Muscle ache 02/24/2022   Erectile dysfunction 11/28/2021   Need for varicella vaccine 11/28/2021   Annual physical exam 07/31/2021   Immunization due 07/31/2021   Arthritis 07/31/2021   Coronary artery disease involving native coronary artery of native heart without angina pectoris 06/02/2021   Screening for colon cancer 05/31/2021   Need for immunization against influenza 05/31/2021   Obesity (BMI 30-39.9) 05/31/2021   Tobacco use 02/02/2018   Acute systolic heart failure 02/02/2018   Non-STEMI (non-ST elevated myocardial infarction) 01/31/2018   Diverticulitis 12/07/2014   Diverticulitis of large intestine with perforation with bleeding    CAD (coronary artery disease), native coronary artery 12/05/2014   Essential hypertension 11/28/2014   Hyperlipidemia    MR (mitral regurgitation)    NSTEMI (non-ST elevated myocardial infarction) (HCC) 11/26/2014    PCP: Christel Mormon  REFERRING PROVIDER: Christel Mormon Next apt: every 3 months  REFERRING DIAG: M54.2 (ICD-10-CM) - Neck pain on right side  THERAPY DIAG:  Cervicalgia   Rationale for Evaluation and Treatment: Rehabilitation  ONSET DATE: 06/02/2022  SUBJECTIVE STATEMENT: Pt stated he has been sick on stomach and hasn't been completing exercises.  Reports of dark stool.  Stated he doesn't feel therapy will be helpful.  Current pain scale 8/10 Rt shoulder and neck region, stated it feels like his shoulder is holding the world.  Stated he was sore following last session.  Eval:  Mr. Ambrosino states that he woke up in  August with neck pain.  He states he thought it would go away but it just got worse.  He states that the pain goes into his Rt shoulder area.  He is unable to close his Rt hand which his MD states is due to OA.  He states that he has noted weakness in his UE.   PERTINENT HISTORY:  See above  PAIN:  Are you having pain? Yes: NPRS scale: 8-9/10 Pain location: Rt cervical  Pain description: throbbing and burning  Aggravating factors: not sure  Relieving factors: advil or tylenol   PRECAUTIONS: None  WEIGHT BEARING RESTRICTIONS: No  FALLS:  Has patient fallen in last 6 months? No  LIVING ENVIRONMENT: Lives with: lives with their family Lives in: House/apartment  OCCUPATION: Pt states that he does not feel the pain when he works.  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  PATIENT GOALS: less pain and to be able to turn his head better   NEXT MD VISIT: after therapy   OBJECTIVE:   DIAGNOSTIC FINDINGS:  None, MRI scheduled  PATIENT SURVEYS:  FOTO 42  COGNITION: Overall cognitive status: Within functional limits for tasks assessed  POSTURE: rounded shoulders and decreased thoracic kyphosis  PALPATION: Tight mm    CERVICAL ROM:   Active ROM A/PROM (deg) eval  Flexion 35 reps no change of sx   Extension 35 reps no change of sx   Right lateral flexion   Left lateral flexion   Right rotation 38  Left rotation 45   (Blank rows = not tested) Cervical isometric B side bend 2+; extension 3- UPPER EXTREMITY ROM: difficult but able to complete full ROM with encouragement.   UPPER EXTREMITY MMT: B generally 4/5 for shoulder and elbow mm, grip strength; Rt: 45#; Lt:64# Fast exchange: Rt 60#, Lt 75#   TODAY'S TREATMENT:                                                                                                                              DATE:  10/01/22: Educated importance of HEP compliance and purpose of therapy Reviewed  HEP Seated: chin tuck 10x  Cervical rotation 0  Scapular retraction  3D thoracic excursion  Wback 10x  Posterior rolls 10 (Up, back and down) Supine: chin tuck 10x  Scapular retraction 10  Manual cervical traction and STM to UT  09/24/22: Reviewed goals  Purpose of therapy  Importance of posture Seated: chin tuck  Scapular retraction 3D thoracic excursion Supine manual STM to upper  trap, scalenes and cervical traction  10/04/22:  Evaluation:                Pt given putty for hand grip.                Supine: Scapular and cervical retraction x 10                 Cervical rotation x10  PATIENT EDUCATION:  Education details: HEP Person educated: Patient Education method: Explanation and Handouts Education comprehension: verbalized understanding and returned demonstration  HOME EXERCISE PROGRAM: Access Code: [email or web address] URL: [email or web address] Date: 09/03/2022 Prepared by: Virgina Organ  Exercises - Supine Chin Tuck  - 2 x daily - 7 x weekly - 1 sets - 10 reps - 3" hold - Supine Scapular Retraction  - 2 x daily - 7 x weekly - 1 sets - 10 reps - 3" hold - Supine Cervical Rotation AROM on Pillow  - 2 x daily - 7 x weekly - 1 sets - 10 reps - 3" hold - Putty Squeezes  - 2 x daily - 7 x weekly - 1 sets - 10 reps - 3" hold  ASSESSMENT:  CLINICAL IMPRESSION: Pt educated purpose of the exercises and importance of compliance with current HEP for maximal benefits.  Reviewed current HEP, pt unable to recall.  Pt guarded through session and limited by pain in seated exercises, required cueing for form and mechanics.  Pt asked about appropriate food to assist with stomach ache, encouraged to contact MD concerning length of time stomach has been hurting and black stool.  Educated benefits with postural strengthening.  EOS with manual techniques with reports of pain reduced with cervical traction.    OBJECTIVE IMPAIRMENTS: decreased ROM, decreased strength, hypomobility,  increased muscle spasms, postural dysfunction, and pain.   ACTIVITY LIMITATIONS: carrying and lifting  PARTICIPATION LIMITATIONS: driving and yard work  PERSONAL FACTORS: Fitness and Time since onset of injury/illness/exacerbation are also affecting patient's functional outcome.   REHAB POTENTIAL: Fair    CLINICAL DECISION MAKING: Stable/uncomplicated  EVALUATION COMPLEXITY: Low   GOALS: Goals reviewed with patient? No  SHORT TERM GOALS: Target date: 09/24/22  Pt to be I in Hep in order to decrease pain to no greater than a  Baseline:  Goal status: IN PROGRESS  2.  PT to improve his rotation by 10 degrees for improved safety in driving  Baseline:  Goal status: IN PROGRESS      LONG TERM GOALS: Target date: 10/15/22  Pt to be I in an advanced  Hep in order to decrease pain to no greater than a  Baseline:  Goal status: IN PROGRESS  2.   PT to improve his rotation by 20 degrees for improved safety in driving Goal status: IN PROGRESS  3.  Pt to no longer have any radicular sx to demonstrate decreased nerve irritation   Goal status: IN PROGRESS  4.  PT UE strength to be at least 4+/5 to improve his functional ability  Goal status: IN PROGRESS   PLAN:  PT FREQUENCY: 2x/week  PT DURATION: 6 weeks  PLANNED INTERVENTIONS: Therapeutic exercises, Patient/Family education, Self Care, Joint mobilization, and Manual therapy  PLAN FOR NEXT SESSION: begin cervical and thoracic excursions progress mobility and stability.  Try manual to decrease sx. F/U with current HEP compliance.   Becky Sax, LPTA/CLT; Rowe Clack [phone]

## 2022-10-05 ENCOUNTER — Telehealth (HOSPITAL_COMMUNITY): Payer: Self-pay | Admitting: Physical Therapy

## 2022-10-05 ENCOUNTER — Encounter (HOSPITAL_COMMUNITY): Payer: Self-pay | Admitting: Physical Therapy

## 2022-10-05 ENCOUNTER — Encounter (HOSPITAL_COMMUNITY): Payer: BC Managed Care – PPO | Admitting: Physical Therapy

## 2022-10-05 NOTE — Telephone Encounter (Signed)
3rd no show: Pt will be discharged, attempted to call pt but there was no answer.  Virgina Organ, PT CLT [phone]

## 2022-10-05 NOTE — Patient Instructions (Signed)
Rodney Mahoney  10/05/2022     @   Your procedure is scheduled on 10/08/2022.  Report to [facility] at 9:00 A.M.  Call this number if you have problems the morning of surgery:  [phone]  If you experience any cold or flu symptoms such as cough, fever, chills, shortness of breath, etc. between now and your scheduled surgery, please notify us at the above number.   Remember:   Please follow the diet and prep instructions given to you by Dr Queen Blossom office.    Last dose of Marcelline Deist should be on 10/04/22 or before.    Take these medicines the morning of surgery with A SIP OF WATER : metoprolol    Do not wear jewelry, make-up or nail polish.  Do not wear lotions, powders, or perfumes, or deodorant.  Do not shave 48 hours prior to surgery.  Men may shave face and neck.  Do not bring valuables to the hospital.  [facility] is not responsible for any belongings or valuables.  Contacts, dentures or bridgework may not be worn into surgery.  Leave your suitcase in the car.  After surgery it may be brought to your room.  For patients admitted to the hospital, discharge time will be determined by your treatment team.  Patients discharged the day of surgery will not be allowed to drive home.   Name and phone number of your driver:   Family Special instructions:  N/A  Please read over the following fact sheets that you were given. Care and Recovery After Surgery   Colonoscopy, Adult A colonoscopy is a procedure to look at the entire large intestine. This procedure is done using a long, thin, flexible tube that has a camera on the end. You may have a colonoscopy: As a part of normal colorectal screening. If you have certain symptoms, such as: A low number of red blood cells in your blood (anemia). Diarrhea that does not go away. Pain in your abdomen. Blood in your stool. A colonoscopy can help screen for and diagnose medical problems, including: An  abnormal growth of cells or tissue (tumor). Abnormal growths within the lining of your intestine (polyps). Inflammation. Areas of bleeding. Tell your health care provider about: Any allergies you have. All medicines you are taking, including vitamins, herbs, eye drops, creams, and over-the-counter medicines. Any problems you or family members have had with anesthetic medicines. Any bleeding problems you have. Any surgeries you have had. Any medical conditions you have. Any problems you have had with having bowel movements. Whether you are pregnant or may be pregnant. What are the risks? Generally, this is a safe procedure. However, problems may occur, including: Bleeding. Damage to your intestine. Allergic reactions to medicines given during the procedure. Infection. This is rare. What happens before the procedure? Eating and drinking restrictions Follow instructions from your health care provider about eating or drinking restrictions, which may include: A few days before the procedure: Follow a low-fiber diet. Avoid nuts, seeds, dried fruit, raw fruits, and vegetables. 1-3 days before the procedure: Eat only gelatin dessert or ice pops. Drink only clear liquids, such as water, clear juice, clear broth or bouillon, black coffee or tea, or clear soft drinks or sports drinks. Avoid liquids that contain red or purple dye. The day of the procedure: Do not eat solid foods. You may continue to drink clear liquids until up to 2 hours before the procedure. Do not eat or drink anything starting 2 hours before  the procedure, or within the time period that your health care provider recommends. Bowel prep If you were prescribed a bowel prep to take by mouth (orally) to clean out your colon: Take it as told by your health care provider. Starting the day before your procedure, you will need to drink a large amount of liquid medicine. The liquid will cause you to have many bowel movements of loose  stool until your stool becomes almost clear or light green. If your skin or the opening between the buttocks (anus) gets irritated from diarrhea, you may relieve the irritation using: Wipes with medicine in them, such as adult wet wipes with aloe and vitamin E. A product to soothe skin, such as petroleum jelly. If you vomit while drinking the bowel prep: Take a break for up to 60 minutes. Begin the bowel prep again. Call your health care provider if you keep vomiting or you cannot take the bowel prep without vomiting. To clean out your colon, you may also be given: Laxative medicines. These help you have a bowel movement. Instructions for enema use. An enema is liquid medicine injected into your rectum. Medicines Ask your health care provider about: Changing or stopping your regular medicines or supplements. This is especially important if you are taking iron supplements, diabetes medicines, or blood thinners. Taking medicines such as aspirin and ibuprofen. These medicines can thin your blood. Do not take these medicines unless your health care provider tells you to take them. Taking over-the-counter medicines, vitamins, herbs, and supplements. General instructions Ask your health care provider what steps will be taken to help prevent infection. These may include washing skin with a germ-killing soap. If you will be going home right after the procedure, plan to have a responsible adult: Take you home from the hospital or clinic. You will not be allowed to drive. Care for you for the time you are told. What happens during the procedure?  An IV will be inserted into one of your veins. You will be given a medicine to make you fall asleep (general anesthetic). You will lie on your side with your knees bent. A lubricant will be put on the tube. Then the tube will be: Inserted into your anus. Gently eased through all parts of your large intestine. Air will be sent into your colon to keep it  open. This may cause some pressure or cramping. Images will be taken with the camera and will appear on a screen. A small tissue sample may be removed to be looked at under a microscope (biopsy). The tissue may be sent to a lab for testing if any signs of problems are found. If small polyps are found, they may be removed and checked for cancer cells. When the procedure is finished, the tube will be removed. The procedure may vary among health care providers and hospitals. What happens after the procedure? Your blood pressure, heart rate, breathing rate, and blood oxygen level will be monitored until you leave the hospital or clinic. You may have a small amount of blood in your stool. You may pass gas and have mild cramping or bloating in your abdomen. This is caused by the air that was used to open your colon during the exam. If you were given a sedative during the procedure, it can affect you for several hours. Do not drive or operate machinery until your health care provider says that it is safe. It is up to you to get the results of your procedure. Ask  your health care provider, or the department that is doing the procedure, when your results will be ready. Summary A colonoscopy is a procedure to look at the entire large intestine. Follow instructions from your health care provider about eating and drinking before the procedure. If you were prescribed an oral bowel prep to clean out your colon, take it as told by your health care provider. During the colonoscopy, a flexible tube with a camera on its end is inserted into the anus and then passed into all parts of the large intestine. This information is not intended to replace advice given to you by your health care provider. Make sure you discuss any questions you have with your health care provider. Document Revised: 05/26/2021 Document Reviewed: 01/22/2021 Elsevier Patient Education  2023 Elsevier Inc[employer]  Monitored Anesthesia  Care Anesthesia refers to the techniques, procedures, and medicines that help a person stay safe and comfortable during surgery. Monitored anesthesia care, or sedation, is one type of anesthesia. You may have sedation if you do not need to be asleep for your procedure. Procedures that use sedation may include: Surgery to remove cataracts from your eyes. A dental procedure. A biopsy. This is when a tissue sample is removed and looked at under a microscope. You will be watched closely during your procedure. Your level of sedation or type of anesthesia may be changed to fit your needs. Tell a health care provider about: Any allergies you have. All medicines you are taking, including vitamins, herbs, eye drops, creams, and over-the-counter medicines. Any problems you or family members have had with anesthesia. Any bleeding problems you have. Any surgeries you have had. Any medical conditions or illnesses you have. This includes sleep apnea, cough, fever, or the flu. Whether you are pregnant or may be pregnant. Whether you use cigarettes, alcohol, or drugs. Any use of steroids, whether by mouth or as a cream. What are the risks? Your health care provider will talk with you about risks. These may include: Getting too much medicine (oversedation). Nausea. Allergic reactions to medicines. Trouble breathing. If this happens, a breathing tube may be used to help you breathe. It will be removed when you are awake and breathing on your own. Heart trouble. Lung trouble. Confusion that gets better with time (emergence delirium). What happens before the procedure? When to stop eating and drinking Follow instructions from your health care provider about what you may eat and drink. These may include: 8 hours before your procedure Stop eating most foods. Do not eat meat, fried foods, or fatty foods. Eat only light foods, such as toast or crackers. All liquids are okay except energy drinks and  alcohol. 6 hours before your procedure Stop eating. Drink only clear liquids, such as water, clear fruit juice, black coffee, plain tea, and sports drinks. Do not drink energy drinks or alcohol. 2 hours before your procedure Stop drinking all liquids. You may be allowed to take medicines with small sips of water. If you do not follow your health care provider's instructions, your procedure may be delayed or canceled. Medicines Ask your health care provider about: Changing or stopping your regular medicines. These include any diabetes medicines or blood thinners you take. Taking medicines such as aspirin and ibuprofen. These medicines can thin your blood. Do not take them unless your health care provider tells you to. Taking over-the-counter medicines, vitamins, herbs, and supplements. Testing You may have an exam or testing. You may have a blood or urine sample taken. General instructions  Do not use any products that contain nicotine or tobacco for at least 4 weeks before the procedure. These products include cigarettes, chewing tobacco, and vaping devices, such as e-cigarettes. If you need help quitting, ask your health care provider. If you will be going home right after the procedure, plan to have a responsible adult: Take you home from the hospital or clinic. You will not be allowed to drive. Care for you for the time you are told. What happens during the procedure?  Your blood pressure, heart rate, breathing, level of pain, and blood oxygen level will be monitored. An IV will be inserted into one of your veins. You may be given: A sedative. This helps you relax. Anesthesia. This will: Numb certain areas of your body. Make you fall asleep for surgery. You will be given medicines as needed to keep you comfortable. The more medicine you are given, the deeper your level of sedation will be. Your level of sedation may be changed to fit your needs. There are three levels of  sedation: Mild sedation. At this level, you may feel awake and relaxed. You will be able to follow directions. Moderate sedation. At this level, you will be sleepy. You may not remember the procedure. Deep sedation. At this level, you will be asleep. You will not remember the procedure. How you get the medicines will depend on your age and the procedure. They may be given as: A pill. This may be taken by mouth (orally) or inserted into the rectum. An injection. This may be into a vein or muscle. A spray through the nose. After your procedure is over, the medicine will be stopped. The procedure may vary among health care providers and hospitals. What happens after the procedure? Your blood pressure, heart rate, breathing rate, and blood oxygen level will be monitored until you leave the hospital or clinic. You may feel sleepy, clumsy, or nauseous. You may not remember what happened during or after the procedure. Sedation can affect you for several hours. Do not drive or use machinery until your health care provider says that it is safe. This information is not intended to replace advice given to you by your health care provider. Make sure you discuss any questions you have with your health care provider. Document Revised: 10/27/2021 Document Reviewed: 10/27/2021 Elsevier Patient Education  2023 [employer].

## 2022-10-05 NOTE — Therapy (Signed)
[facility] Outpatient Rehabilitation at [facility] [address] [place], [place], [address] Phone: [phone]   Fax:  [phone]  Patient Details  Name: Rodney Mahoney MRN: [id number] Date of Birth: 1960-10-25 Referring Provider:  No ref. provider found  Encounter Date: 10/05/2022  Please disregard third no show. PT showed at 11:35 stating that he thought that his appointment was at 11:45.  Pt will not be discharged.  Virgina Organ, PT CLT [phone]  10/05/2022, 11:54 AM  [facility] Outpatient Rehabilitation at [facility] [address] [place], [place], [address] Phone: [phone]   Fax:  [phone]

## 2022-10-06 ENCOUNTER — Encounter (HOSPITAL_COMMUNITY)
Admission: RE | Admit: 2022-10-06 | Discharge: 2022-10-06 | Disposition: A | Discharge status: Home / Self Care | Payer: BC Managed Care – PPO | Source: Ambulatory Visit | Attending: Internal Medicine | Admitting: Internal Medicine

## 2022-10-06 ENCOUNTER — Telehealth: Payer: BC Managed Care – PPO | Admitting: *Deleted

## 2022-10-06 NOTE — Telephone Encounter (Signed)
Called pt, no answer and not able to leave VM 

## 2022-10-06 NOTE — Telephone Encounter (Signed)
-----   Message from Lillia Mountain, RN sent at 10/06/2022  1:50 PM EDT ----- Regarding: No show for PAT Rodney Mahoney,  [name] was a no show for PAT today  Thanks,  Cliffton Asters RN

## 2022-10-07 ENCOUNTER — Telehealth (HOSPITAL_COMMUNITY): Payer: Self-pay | Admitting: Physical Therapy

## 2022-10-07 ENCOUNTER — Encounter (HOSPITAL_COMMUNITY): Payer: BC Managed Care – PPO | Admitting: Physical Therapy

## 2022-10-07 NOTE — Telephone Encounter (Signed)
3rd NO SHOW Called pt re no show and that he would be discharged, unfortunately pt mailbox is full and therapist will not be able to leave a message.  A letter was sent explaining that pt remaining visits have been cancelled and that he will need to have the MD refer him again to therapy if he desires to resume.    Virgina Organ, PT CLT [phone]

## 2022-10-08 ENCOUNTER — Ambulatory Visit (HOSPITAL_COMMUNITY): Payer: BC Managed Care – PPO | Admitting: Anesthesiology

## 2022-10-08 ENCOUNTER — Encounter (HOSPITAL_COMMUNITY)
Admission: RE | Disposition: A | Discharge status: Home / Self Care | Payer: Self-pay | Source: Home / Self Care | Attending: Internal Medicine

## 2022-10-08 ENCOUNTER — Ambulatory Visit (HOSPITAL_COMMUNITY)
Admission: RE | Admit: 2022-10-08 | Discharge: 2022-10-08 | Disposition: A | Discharge status: Home / Self Care | Payer: BC Managed Care – PPO | Attending: Internal Medicine | Admitting: Internal Medicine

## 2022-10-08 ENCOUNTER — Encounter (HOSPITAL_COMMUNITY): Payer: Self-pay

## 2022-10-08 DIAGNOSIS — M1990 Unspecified osteoarthritis, unspecified site: Secondary | ICD-10-CM | POA: Insufficient documentation

## 2022-10-08 DIAGNOSIS — I252 Old myocardial infarction: Secondary | ICD-10-CM | POA: Insufficient documentation

## 2022-10-08 DIAGNOSIS — K648 Other hemorrhoids: Secondary | ICD-10-CM | POA: Insufficient documentation

## 2022-10-08 DIAGNOSIS — I2510 Atherosclerotic heart disease of native coronary artery without angina pectoris: Secondary | ICD-10-CM | POA: Diagnosis not present

## 2022-10-08 DIAGNOSIS — Z1211 Encounter for screening for malignant neoplasm of colon: Secondary | ICD-10-CM | POA: Insufficient documentation

## 2022-10-08 DIAGNOSIS — I509 Heart failure, unspecified: Secondary | ICD-10-CM | POA: Insufficient documentation

## 2022-10-08 DIAGNOSIS — Z955 Presence of coronary angioplasty implant and graft: Secondary | ICD-10-CM | POA: Insufficient documentation

## 2022-10-08 DIAGNOSIS — I110 Hypertensive heart disease with heart failure: Secondary | ICD-10-CM | POA: Diagnosis not present

## 2022-10-08 DIAGNOSIS — K5730 Diverticulosis of large intestine without perforation or abscess without bleeding: Secondary | ICD-10-CM | POA: Insufficient documentation

## 2022-10-08 DIAGNOSIS — F17210 Nicotine dependence, cigarettes, uncomplicated: Secondary | ICD-10-CM | POA: Insufficient documentation

## 2022-10-08 HISTORY — PX: COLONOSCOPY WITH PROPOFOL: SHX5780

## 2022-10-08 LAB — GLUCOSE, CAPILLARY: Glucose-Capillary: 92 mg/dL (ref 70–99)

## 2022-10-08 SURGERY — COLONOSCOPY WITH PROPOFOL
Anesthesia: General

## 2022-10-08 MED ORDER — LACTATED RINGERS IV SOLN: INTRAVENOUS | Status: DC

## 2022-10-08 MED ORDER — PROPOFOL 500 MG/50ML IV EMUL
INTRAVENOUS | Status: DC | PRN
  Administered 2022-10-08: 150 ug/kg/min via INTRAVENOUS

## 2022-10-08 MED ORDER — PROPOFOL 10 MG/ML IV BOLUS
INTRAVENOUS | Status: DC | PRN
  Administered 2022-10-08: 100 mg via INTRAVENOUS

## 2022-10-08 MED ORDER — LIDOCAINE HCL (CARDIAC) PF 100 MG/5ML IV SOSY
PREFILLED_SYRINGE | INTRAVENOUS | Status: DC | PRN
  Administered 2022-10-08: 50 mg via INTRAVENOUS

## 2022-10-08 NOTE — Op Note (Signed)
[facility] Patient Name: Rodney Mahoney Procedure Date: 10/08/2022 2:09 PM MRN: [id number] Date of Birth: Sep 16, 1960 Attending MD: Hennie Duos. Marletta Lor , [place], [id number] CSN: [id number] Age: 62 Admit Type: Outpatient Procedure:                Colonoscopy Indications:              Screening for colorectal malignant neoplasm Providers:                Hennie Duos. Marletta Lor, DO, Buel Ream. Museum/gallery exhibitions officer, Charity fundraiser,                            Judeth Cornfield. Jessee Avers, Technician Referring MD:              Medicines:                See the Anesthesia note for documentation of the                            administered medications Complications:            No immediate complications. Estimated Blood Loss:     Estimated blood loss: none. Procedure:                Pre-Anesthesia Assessment:                           - The anesthesia plan was to use monitored                            anesthesia care (MAC).                           After obtaining informed consent, the colonoscope                            was passed under direct vision. Throughout the                            procedure, the patient's blood pressure, pulse, and                            oxygen saturations were monitored continuously. The                            [id number]-HQ190L ([id number]) scope was introduced through                            the anus and advanced to the the cecum, identified                            by appendiceal orifice and ileocecal valve. The                            colonoscopy was performed without difficulty. The                            patient tolerated  the procedure well. The quality                            of the bowel preparation was evaluated using the                            BBPS [facility] Bowel Preparation Scale) with scores                            of: Right Colon = 2 (minor amount of residual                            staining, small fragments of stool and/or opaque                             liquid, but mucosa seen well), Transverse Colon = 3                            (entire mucosa seen well with no residual staining,                            small fragments of stool or opaque liquid) and Left                            Colon = 2 (minor amount of residual staining, small                            fragments of stool and/or opaque liquid, but mucosa                            seen well). The total BBPS score equals 7. The                            quality of the bowel preparation was good. Scope In: 2:25:24 PM Scope Out: 2:38:35 PM Scope Withdrawal Time: 0 hours 10 minutes 19 seconds  Total Procedure Duration: 0 hours 13 minutes 11 seconds  Findings:      Non-bleeding internal hemorrhoids were found during endoscopy.      Multiple medium-mouthed diverticula were found in the sigmoid colon.      The exam was otherwise without abnormality. Impression:               - Non-bleeding internal hemorrhoids.                           - Diverticulosis in the sigmoid colon.                           - The examination was otherwise normal.                           - No specimens collected. Moderate Sedation:      Per Anesthesia Care Recommendation:           - Patient has a contact number available  for                            emergencies. The signs and symptoms of potential                            delayed complications were discussed with the                            patient. Return to normal activities tomorrow.                            Written discharge instructions were provided to the                            patient.                           - Resume previous diet.                           - Continue present medications.                           - Repeat colonoscopy in 10 years for screening                            purposes.                           - Return to GI clinic PRN. Procedure Code(s):        --- Professional ---                            Z6109, Colorectal cancer screening; colonoscopy on                            individual not meeting criteria for high risk Diagnosis Code(s):        --- Professional ---                           Z12.11, Encounter for screening for malignant                            neoplasm of colon                           K64.8, Other hemorrhoids                           K57.30, Diverticulosis of large intestine without                            perforation or abscess without bleeding CPT copyright 2022 American Medical Association. All rights reserved. The codes documented in this report are preliminary and upon coder review may  be revised to meet current compliance requirements. Hennie Duos. Marletta Lor, DO Hennie Duos. Marletta Lor,  DO 10/08/2022 2:43:03 PM This report has been signed electronically. Number of Addenda: 0

## 2022-10-08 NOTE — Anesthesia Postprocedure Evaluation (Signed)
Anesthesia Post Note  Patient: Rodney Mahoney  Procedure(s) Performed: COLONOSCOPY WITH PROPOFOL  Patient location during evaluation: Phase II Anesthesia Type: General Level of consciousness: awake and alert and oriented Pain management: pain level controlled Vital Signs Assessment: post-procedure vital signs reviewed and stable Respiratory status: spontaneous breathing, nonlabored ventilation and respiratory function stable Cardiovascular status: blood pressure returned to baseline and stable Postop Assessment: no apparent nausea or vomiting Anesthetic complications: no  No notable events documented.   Last Vitals:  Vitals:   10/08/22 1245 10/08/22 1443  BP: 134/79 (!) 89/50  Pulse: 74 89  Resp: 18 19  Temp:  37 C  SpO2: 97% 96%    Last Pain:  Vitals:   10/08/22 1443  TempSrc: Oral  PainSc: 0-No pain                 Miliani Deike C Vaishali Baise

## 2022-10-08 NOTE — Discharge Instructions (Signed)
°  Colonoscopy °Discharge Instructions ° °Read the instructions outlined below and refer to this sheet in the next few weeks. These discharge instructions provide you with general information on caring for yourself after you leave the hospital. Your doctor may also give you specific instructions. While your treatment has been planned according to the most current medical practices available, unavoidable complications occasionally occur.  ° °ACTIVITY °You may resume your regular activity, but move at a slower pace for the next 24 hours.  °Take frequent rest periods for the next 24 hours.  °Walking will help get rid of the air and reduce the bloated feeling in your belly (abdomen).  °No driving for 24 hours (because of the medicine (anesthesia) used during the test).   °Do not sign any important legal documents or operate any machinery for 24 hours (because of the anesthesia used during the test).  °NUTRITION °Drink plenty of fluids.  °You may resume your normal diet as instructed by your doctor.  °Begin with a light meal and progress to your normal diet. Heavy or fried foods are harder to digest and may make you feel sick to your stomach (nauseated).  °Avoid alcoholic beverages for 24 hours or as instructed.  °MEDICATIONS °You may resume your normal medications unless your doctor tells you otherwise.  °WHAT YOU CAN EXPECT TODAY °Some feelings of bloating in the abdomen.  °Passage of more gas than usual.  °Spotting of blood in your stool or on the toilet paper.  °IF YOU HAD POLYPS REMOVED DURING THE COLONOSCOPY: °No aspirin products for 7 days or as instructed.  °No alcohol for 7 days or as instructed.  °Eat a soft diet for the next 24 hours.  °FINDING OUT THE RESULTS OF YOUR TEST °Not all test results are available during your visit. If your test results are not back during the visit, make an appointment with your caregiver to find out the results. Do not assume everything is normal if you have not heard from your  caregiver or the medical facility. It is important for you to follow up on all of your test results.  °SEEK IMMEDIATE MEDICAL ATTENTION IF: °You have more than a spotting of blood in your stool.  °Your belly is swollen (abdominal distention).  °You are nauseated or vomiting.  °You have a temperature over 101.  °You have abdominal pain or discomfort that is severe or gets worse throughout the day.  ° °Your colonoscopy was relatively unremarkable.  I did not find any polyps or evidence of colon cancer.  I recommend repeating colonoscopy in 10 years for colon cancer screening purposes.  You do have diverticulosis and internal hemorrhoids. I would recommend increasing fiber in your diet or adding OTC Benefiber/Metamucil. Be sure to drink at least 4 to 6 glasses of water daily. Follow-up with GI as needed. ° ° °I hope you have a great rest of your week! ° °Jayshon Dommer K. Slyvester Latona, D.O. °Gastroenterology and Hepatology °[facility] ° °

## 2022-10-08 NOTE — H&P (Signed)
Primary Care Physician:  Billie Lade, MD Primary Gastroenterologist:  Dr. Marletta Lor  Pre-Procedure History & Physical: HPI:  Rodney Mahoney is a 62 y.o. male is here for a colonoscopy for colon cancer screening purposes.  Patient denies any family history of colorectal cancer.  No melena or hematochezia.  No abdominal pain or unintentional weight loss.  No change in bowel habits.  Overall feels well from a GI standpoint.  Past Medical History:  Diagnosis Date   Bronchitis    CAD (coronary artery disease)    a. cath 11/26/2014 EF 45%, moderate to severe inferior hypokinesis, 90% small D1, 100% mid to distal RCA with minimal collateral, 40% mid to distal LCx. Medical therapy as RCA occlusion appears to be completed event; DES to mid circumflex October 2019   Essential hypertension    Hyperlipidemia    Ischemic cardiomyopathy    NSTEMI (non-ST elevated myocardial infarction)    11/26/14, 01/31/18    Past Surgical History:  Procedure Laterality Date   CARDIAC CATHETERIZATION N/A 11/26/2014   Procedure: Left Heart Cath and Coronary Angiography;  Surgeon: Peter M Swaziland, MD;  Location: [facility] INVASIVE CV LAB;  Service: Cardiovascular;  Laterality: N/A;   COLONOSCOPY     per patient: done in [place], age 2, couple of polyps.   CORONARY STENT INTERVENTION N/A 01/31/2018   Procedure: CORONARY STENT INTERVENTION;  Surgeon: Runell Gess, MD;  Location: [facility] INVASIVE CV LAB;  Service: Cardiovascular;  Laterality: N/A;   LEFT HEART CATH AND CORONARY ANGIOGRAPHY N/A 01/31/2018   Procedure: LEFT HEART CATH AND CORONARY ANGIOGRAPHY;  Surgeon: Runell Gess, MD;  Location: [facility] INVASIVE CV LAB;  Service: Cardiovascular;  Laterality: N/A;   NECK SURGERY  2005    Prior to Admission medications   Medication Sig Start Date End Date Taking? Authorizing Provider  aspirin EC 81 MG tablet Take 1 tablet (81 mg total) by mouth daily. Swallow whole. 06/27/21  Yes Furth, Cadence H, PA-C  atorvastatin (LIPITOR) 80  MG tablet Take 1 tablet (80 mg total) by mouth daily. TAKE 1 TABLET BY MOUTH EVERY DAY AT 6 PM 08/25/22 02/21/23 Yes Billie Lade, MD  dapagliflozin propanediol (FARXIGA) 10 MG TABS tablet Take 1 tablet (10 mg total) by mouth daily before breakfast. 08/25/22  Yes Billie Lade, MD  ezetimibe (ZETIA) 10 MG tablet Take 1 tablet (10 mg total) by mouth daily. 12/03/21  Yes Paseda, Baird Kay, FNP  lisinopril (ZESTRIL) 10 MG tablet Take 1 tablet (10 mg total) by mouth daily. 10/20/21 10/15/22 Yes [name], MD  Menthol, Topical Analgesic, (BIOFREEZE EX) Apply 1 Application topically daily as needed (pain).   Yes [provider]  metoprolol succinate (TOPROL-XL) 25 MG 24 hr tablet Take 1 tablet by mouth once daily 09/30/22  Yes Billie Lade, MD  naproxen sodium (ALEVE) 220 MG tablet Take 440 mg by mouth 2 (two) times daily as needed (pain).   Yes [provider]  sildenafil (VIAGRA) 50 MG tablet TAKE 1 TABLET BY MOUTH ONCE DAILY AS NEEDED FOR ERECTILE DYSFUNCTION Patient taking differently: Take 25 mg by mouth daily as needed for erectile dysfunction. 09/30/22  Yes Billie Lade, MD  cyclobenzaprine (FLEXERIL) 5 MG tablet Take 1 tablet (5 mg total) by mouth 3 (three) times daily as needed for muscle spasms. Patient not taking: Reported on 10/05/2022 07/28/22   Billie Lade, MD  meloxicam (MOBIC) 7.5 MG tablet Take 1 tablet (7.5 mg total) by mouth daily. Patient  not taking: Reported on 10/05/2022 06/02/22   Gardenia Phlegm, MD  nitroGLYCERIN (NITROSTAT) 0.4 MG SL tablet Place 1 tablet (0.4 mg total) under the tongue every 5 (five) minutes as needed. 02/02/18   Arty Baumgartner, NP    Allergies as of 09/03/2022   (No Known Allergies)    Family History  Problem Relation Age of Onset   Diabetes Mother    Heart attack Father    Cancer Sister    Hypertension Brother    Colon cancer Neg Hx    Prostate cancer Neg Hx    Lung cancer Neg Hx     Social History    Socioeconomic History   Marital status: Married    Spouse name: Not on file   Number of children: Not on file   Years of education: Not on file   Highest education level: Not on file  Occupational History   Not on file  Tobacco Use   Smoking status: Every Day    Packs/day: 1.00    Years: 42.00    Additional pack years: 0.00    Total pack years: 42.00    Types: Cigarettes   Smokeless tobacco: Never   Tobacco comments:    He used to quit and start back. He has not quit for the past 5 years.   Vaping Use   Vaping Use: Never used  Substance and Sexual Activity   Alcohol use: Yes    Comment: 10 cans of beer   Drug use: Yes    Types: Marijuana    Comment: smokes week here and there   Sexual activity: Yes    Birth control/protection: None  Other Topics Concern   Not on file  Social History Narrative   Works at [employer] and live with wife.    Social Determinants of Health   Financial Resource Strain: Not on file  Food Insecurity: Not on file  Transportation Needs: Not on file  Physical Activity: Not on file  Stress: Not on file  Social Connections: Not on file  Intimate Partner Violence: Not on file    Review of Systems: See HPI, otherwise negative ROS  Physical Exam: Vital signs in last 24 hours: Temp:  [98.8 F (37.1 C)] 98.8 F (37.1 C) (04/25 1227) Pulse Rate:  [74-82] 74 (04/25 1245) Resp:  [18] 18 (04/25 1245) BP: (134-135)/(79-96) 134/79 (04/25 1245) SpO2:  [97 %-99 %] 97 % (04/25 1245)   General:   Alert,  Well-developed, well-nourished, pleasant and cooperative in NAD Head:  Normocephalic and atraumatic. Eyes:  Sclera clear, no icterus.   Conjunctiva pink. Ears:  Normal auditory acuity. Nose:  No deformity, discharge,  or lesions. Msk:  Symmetrical without gross deformities. Normal posture. Extremities:  Without clubbing or edema. Neurologic:  Alert and  oriented x4;  grossly normal neurologically. Skin:  Intact without significant lesions  or rashes. Psych:  Alert and cooperative. Normal mood and affect.  Impression/Plan: Rodney Mahoney is here for a colonoscopy to be performed for colon cancer screening purposes.  The risks of the procedure including infection, bleed, or perforation as well as benefits, limitations, alternatives and imponderables have been reviewed with the patient. Questions have been answered. All parties agreeable.

## 2022-10-08 NOTE — Transfer of Care (Signed)
Immediate Anesthesia Transfer of Care Note  Patient: BOWYN MERCIER  Procedure(s) Performed: COLONOSCOPY WITH PROPOFOL  Patient Location: Short Stay  Anesthesia Type:General  Level of Consciousness: drowsy  Airway & Oxygen Therapy: Patient Spontanous Breathing  Post-op Assessment: Report given to RN and Post -op Vital signs reviewed and stable  Post vital signs: Reviewed and stable  Last Vitals:  Vitals Value Taken Time  BP 89/50 10/08/22 1443  Temp 37 C 10/08/22 1443  Pulse 89 10/08/22 1443  Resp 19 10/08/22 1443  SpO2 96 % 10/08/22 1443    Last Pain:  Vitals:   10/08/22 1443  TempSrc: Oral  PainSc: 0-No pain      Patients Stated Pain Goal: 4 (10/08/22 1225)  Complications: No notable events documented.

## 2022-10-08 NOTE — Telephone Encounter (Signed)
Note from Dr. Marletta Lor, pt showed up for procedure

## 2022-10-08 NOTE — Anesthesia Preprocedure Evaluation (Addendum)
Anesthesia Evaluation  Patient identified by MRN, date of birth, ID band Patient awake    Reviewed: Allergy & Precautions, H&P , NPO status , Patient's Chart, lab work & pertinent test results, reviewed documented beta blocker date and time   Airway Mallampati: II  TM Distance: >3 FB Neck ROM: Full    Dental  (+) Dental Advisory Given, Missing, Chipped   Pulmonary Current Smoker and Patient abstained from smoking.   Pulmonary exam normal breath sounds clear to auscultation       Cardiovascular Exercise Tolerance: Good hypertension, Pt. on medications and Pt. on home beta blockers + CAD, + Past MI, + Cardiac Stents and +CHF  Normal cardiovascular exam Rhythm:Regular Rate:Normal  1. Limited echo no color flow or doppler done.   2. Inferior basal hypokinesis . Left ventricular ejection fraction, by  estimation, is 50 to 55%. The left ventricle has low normal function. The  left ventricle has no regional wall motion abnormalities.   3. Right ventricular systolic function is normal. The right ventricular  size is normal.   4. Left atrial size was mildly dilated.   5. The mitral valve was not assessed. No evidence of mitral valve  regurgitation. No evidence of mitral stenosis.   6. The aortic valve was not assessed. Aortic valve regurgitation is not  visualized. No aortic stenosis is present.   7. The inferior vena cava is normal in size with greater than 50%  respiratory variability, suggesting right atrial pressure of 3 mmHg.     Neuro/Psych negative neurological ROS  negative psych ROS   GI/Hepatic negative GI ROS, Neg liver ROS,,,  Endo/Other  negative endocrine ROS    Renal/GU negative Renal ROS  negative genitourinary   Musculoskeletal  (+) Arthritis , Osteoarthritis,    Abdominal   Peds negative pediatric ROS (+)  Hematology negative hematology ROS (+)   Anesthesia Other Findings    Reproductive/Obstetrics negative OB ROS                             Anesthesia Physical Anesthesia Plan  ASA: 3  Anesthesia Plan: General   Post-op Pain Management: Minimal or no pain anticipated   Induction: Intravenous  PONV Risk Score and Plan: 1 and Propofol infusion  Airway Management Planned: Nasal Cannula and Natural Airway  Additional Equipment:   Intra-op Plan:   Post-operative Plan:   Informed Consent: I have reviewed the patients History and Physical, chart, labs and discussed the procedure including the risks, benefits and alternatives for the proposed anesthesia with the patient or authorized representative who has indicated his/her understanding and acceptance.     Dental advisory given  Plan Discussed with: CRNA and Surgeon  Anesthesia Plan Comments:        Anesthesia Quick Evaluation

## 2022-10-13 ENCOUNTER — Encounter (HOSPITAL_COMMUNITY): Payer: BC Managed Care – PPO | Admitting: Physical Therapy

## 2022-10-14 ENCOUNTER — Encounter (HOSPITAL_COMMUNITY): Payer: Self-pay | Admitting: Internal Medicine

## 2022-10-15 ENCOUNTER — Encounter (HOSPITAL_COMMUNITY): Payer: BC Managed Care – PPO | Admitting: Physical Therapy

## 2022-10-19 ENCOUNTER — Encounter (HOSPITAL_COMMUNITY): Payer: BC Managed Care – PPO | Admitting: Physical Therapy

## 2022-10-22 ENCOUNTER — Encounter (HOSPITAL_COMMUNITY): Payer: BC Managed Care – PPO | Admitting: Physical Therapy

## 2022-10-27 ENCOUNTER — Encounter (HOSPITAL_COMMUNITY): Payer: BC Managed Care – PPO | Admitting: Physical Therapy

## 2022-10-28 ENCOUNTER — Ambulatory Visit (INDEPENDENT_AMBULATORY_CARE_PROVIDER_SITE_OTHER): Payer: BC Managed Care – PPO | Admitting: Internal Medicine

## 2022-10-28 ENCOUNTER — Encounter: Payer: Self-pay | Admitting: Internal Medicine

## 2022-10-28 VITALS — BP 129/75 | HR 97 | Ht 66.0 in | Wt 181.8 lb

## 2022-10-28 DIAGNOSIS — M542 Cervicalgia: Secondary | ICD-10-CM

## 2022-10-28 DIAGNOSIS — Z720 Tobacco use: Secondary | ICD-10-CM | POA: Diagnosis not present

## 2022-10-28 DIAGNOSIS — R7303 Prediabetes: Secondary | ICD-10-CM | POA: Diagnosis not present

## 2022-10-28 DIAGNOSIS — Z1329 Encounter for screening for other suspected endocrine disorder: Secondary | ICD-10-CM | POA: Diagnosis not present

## 2022-10-28 DIAGNOSIS — Z9889 Other specified postprocedural states: Secondary | ICD-10-CM | POA: Diagnosis not present

## 2022-10-28 DIAGNOSIS — I2510 Atherosclerotic heart disease of native coronary artery without angina pectoris: Secondary | ICD-10-CM | POA: Diagnosis not present

## 2022-10-28 DIAGNOSIS — E669 Obesity, unspecified: Secondary | ICD-10-CM

## 2022-10-28 DIAGNOSIS — E785 Hyperlipidemia, unspecified: Secondary | ICD-10-CM | POA: Diagnosis not present

## 2022-10-28 DIAGNOSIS — I10 Essential (primary) hypertension: Secondary | ICD-10-CM

## 2022-10-28 NOTE — Assessment & Plan Note (Signed)
Presenting today for an acute visit endorsing persistent right-sided neck pain radiating to both shoulders.  He has recently attempted physical therapy but states that it made his symptoms worse.  Remote history of neck surgery (2005) in which he reports that hardware was implanted.  Limited records are available for review.  Currently managing his symptoms with Tylenol and NSAIDs, which are effective in alleviating his pain. -Given the persistence of pain, history of cervical spine surgery, and failure of PT to improve his symptoms, I have placed referral to neurosurgery for further evaluation and management. -Continue as needed use of Tylenol/NSAIDs in the interim

## 2022-10-28 NOTE — Patient Instructions (Signed)
It was a pleasure to see you today.  Thank you for giving [place] the opportunity to be involved in your care.  Below is a brief recap of your visit and next steps.  We will plan to see you again in June  Summary Repeat labs today Orthopedic surgery referral placed Follow up  as previously scheduled next month

## 2022-10-28 NOTE — Progress Notes (Signed)
Acute Office Visit  Subjective:     Patient ID: Rodney Mahoney, male    DOB: May 21, 1961, 62 y.o.   MRN: [id number]  Chief Complaint  Patient presents with   Neck Pain    Neck, chest, shoulders feel tight and sore. Started on 02/01/2022 and seems to be getting worse and spreading.   Rodney Mahoney presents today for an acute visit endorsing persistent right-sided neck pain.  Rodney Mahoney has been evaluated by me multiple times previously endorsing persistent right-sided neck pain.  Previously referred to physical therapy, which Rodney Mahoney reports today made his pain worse.  Rodney Mahoney is currently managing his pain with Tylenol and NSAIDs.  CT C-spine previously ordered but not covered by insurance as Rodney Mahoney had not completed physical therapy.  Rodney Mahoney reports a remote history of surgery on his cervical spine in which hardware was implanted.  Today Rodney Mahoney continues to endorse right-sided neck pain that has spread to both shoulders.  Rodney Mahoney additionally endorses tenderness on the right chest wall.  This is reproducible on palpation.  Review of Systems  Musculoskeletal:  Positive for joint pain (Bilateral shoulder pain) and neck pain.  All other systems reviewed and are negative.     Objective:    BP 129/75   Pulse 97   Ht 5\' 6"  (1.676 m)   Wt 181 lb 12.8 oz (82.5 kg)   SpO2 94%   BMI 29.34 kg/m  BP Readings from Last 3 Encounters:  10/28/22 129/75  10/08/22 (!) 89/50  08/25/22 126/78   Physical Exam Vitals reviewed.  Constitutional:      General: Rodney Mahoney is not in acute distress.    Appearance: Normal appearance. Rodney Mahoney is not ill-appearing.  HENT:     Head: Normocephalic and atraumatic.     Right Ear: External ear normal.     Left Ear: External ear normal.     Nose: Nose normal. No congestion or rhinorrhea.     Mouth/Throat:     Mouth: Mucous membranes are moist.     Pharynx: Oropharynx is clear.  Eyes:     General: No scleral icterus.    Extraocular Movements: Extraocular movements intact.      Conjunctiva/sclera: Conjunctivae normal.     Pupils: Pupils are equal, round, and reactive to light.  Cardiovascular:     Rate and Rhythm: Normal rate and regular rhythm.     Pulses: Normal pulses.     Heart sounds: Normal heart sounds. No murmur heard. Pulmonary:     Effort: Pulmonary effort is normal.     Breath sounds: Normal breath sounds. No wheezing, rhonchi or rales.  Abdominal:     General: Abdomen is flat. Bowel sounds are normal. There is no distension.     Palpations: Abdomen is soft.     Tenderness: There is no abdominal tenderness.  Musculoskeletal:        General: No swelling or deformity. Normal range of motion.     Cervical back: Normal range of motion.     Comments: No obvious deformity on inspection of the neck and shoulders.  ROM of the shoulders and neck is generally intact, Rodney Mahoney does endorse pain with rightward movements of the neck.  There is tenderness palpation of the right trapezius.  Strength and sensation are intact in the upper extremities bilaterally  Skin:    General: Skin is warm and dry.     Capillary Refill: Capillary refill takes less than 2 seconds.  Neurological:     General: No focal deficit  present.     Mental Status: Rodney Mahoney is alert and oriented to person, place, and time.     Motor: No weakness.  Psychiatric:        Mood and Affect: Mood normal.        Behavior: Behavior normal.        Thought Content: Thought content normal.       Assessment & Plan:   Problem List Items Addressed This Visit       Neck pain with history of cervical spinal surgery    Presenting today for an acute visit endorsing persistent right-sided neck pain radiating to both shoulders.  Rodney Mahoney has recently attempted physical therapy but states that it made his symptoms worse.  Remote history of neck surgery (2005) in which Rodney Mahoney reports that hardware was implanted.  Limited records are available for review.  Currently managing his symptoms with Tylenol and NSAIDs, which are effective  in alleviating his pain. -Given the persistence of pain, history of cervical spine surgery, and failure of PT to improve his symptoms, I have placed referral to neurosurgery for further evaluation and management. -Continue as needed use of Tylenol/NSAIDs in the interim      Relevant Orders   Ambulatory referral to Neurosurgery   Return if symptoms worsen or fail to improve.  Billie Lade, MD

## 2022-10-29 ENCOUNTER — Encounter (HOSPITAL_COMMUNITY): Payer: BC Managed Care – PPO | Admitting: Physical Therapy

## 2022-10-29 LAB — CBC WITH DIFFERENTIAL/PLATELET
Basophils Absolute: 0.1 10*3/uL (ref 0.0–0.2)
Basos: 0 %
EOS (ABSOLUTE): 0.2 10*3/uL (ref 0.0–0.4)
Eos: 1 %
Hematocrit: 40.1 % (ref 37.5–51.0)
Hemoglobin: 13.1 g/dL (ref 13.0–17.7)
Immature Grans (Abs): 0 10*3/uL (ref 0.0–0.1)
Immature Granulocytes: 0 %
Lymphocytes Absolute: 3.2 10*3/uL — ABNORMAL HIGH (ref 0.7–3.1)
Lymphs: 28 %
MCH: 29.2 pg (ref 26.6–33.0)
MCHC: 32.7 g/dL (ref 31.5–35.7)
MCV: 90 fL (ref 79–97)
Monocytes Absolute: 1.3 10*3/uL — ABNORMAL HIGH (ref 0.1–0.9)
Monocytes: 11 %
Neutrophils Absolute: 6.8 10*3/uL (ref 1.4–7.0)
Neutrophils: 60 %
Platelets: 363 10*3/uL (ref 150–450)
RBC: 4.48 x10E6/uL (ref 4.14–5.80)
RDW: 13.3 % (ref 11.6–15.4)
WBC: 11.6 10*3/uL — ABNORMAL HIGH (ref 3.4–10.8)

## 2022-10-29 LAB — HEMOGLOBIN A1C
Est. average glucose Bld gHb Est-mCnc: 134 mg/dL
Hgb A1c MFr Bld: 6.3 % — ABNORMAL HIGH (ref 4.8–5.6)

## 2022-10-29 LAB — CMP14+EGFR
ALT: 22 IU/L (ref 0–44)
AST: 19 IU/L (ref 0–40)
Albumin/Globulin Ratio: 1.3 (ref 1.2–2.2)
Albumin: 4.1 g/dL (ref 3.9–4.9)
Alkaline Phosphatase: 91 IU/L (ref 44–121)
BUN/Creatinine Ratio: 14 (ref 10–24)
BUN: 10 mg/dL (ref 8–27)
Bilirubin Total: 0.3 mg/dL (ref 0.0–1.2)
CO2: 21 mmol/L (ref 20–29)
Calcium: 10 mg/dL (ref 8.6–10.2)
Chloride: 100 mmol/L (ref 96–106)
Creatinine, Ser: 0.72 mg/dL — ABNORMAL LOW (ref 0.76–1.27)
Globulin, Total: 3.1 g/dL (ref 1.5–4.5)
Glucose: 89 mg/dL (ref 70–99)
Potassium: 4.4 mmol/L (ref 3.5–5.2)
Sodium: 137 mmol/L (ref 134–144)
Total Protein: 7.2 g/dL (ref 6.0–8.5)
eGFR: 103 mL/min/{1.73_m2} (ref >59)

## 2022-10-29 LAB — B12 AND FOLATE PANEL
Folate: 5.6 ng/mL (ref >3.0)
Vitamin B-12: 693 pg/mL (ref 232–1245)

## 2022-10-29 LAB — LIPID PANEL
Chol/HDL Ratio: 3 ratio (ref 0.0–5.0)
Cholesterol, Total: 116 mg/dL (ref 100–199)
HDL: 39 mg/dL — ABNORMAL LOW (ref >39)
LDL Chol Calc (NIH): 59 mg/dL (ref 0–99)
Triglycerides: 93 mg/dL (ref 0–149)
VLDL Cholesterol Cal: 18 mg/dL (ref 5–40)

## 2022-10-29 LAB — TSH+FREE T4
Free T4: 0.99 ng/dL (ref 0.82–1.77)
TSH: 1.12 u[IU]/mL (ref 0.450–4.500)

## 2022-10-29 LAB — VITAMIN D 25 HYDROXY (VIT D DEFICIENCY, FRACTURES): Vit D, 25-Hydroxy: 34.7 ng/mL (ref 30.0–100.0)

## 2022-11-12 NOTE — Progress Notes (Signed)
Referring Physician:  Billie Lade, MD [address] [place],  [place] [address]  Primary Physician:  Billie Lade, MD  History of Present Illness: 11/18/2022 Mr. Rodney Mahoney has a history of NSTEMI, HTN, CAD, heart failure, hyperlipidemia, obesity.   History of cervical fusion (?C5-C7) in 2005.   10 month history of constant neck pain that radiates to both shoulders. Pain also goes into pectoral region on right > left. Some relief with medications. No specific aggravating factors. He feels stiffness in his arms. He has occasional numbness/tingling in right > left hand. He notes occasional weakness in arms.   No relief with mobic as it did not help. He is not taking flexeril. He takes prn tylenol and motrin with some relief.   Bowel/Bladder Dysfunction: none  Conservative measures:  Physical therapy: did PT x 3 visits (09/03/22, 09/24/22, 10/01/22) Multimodal medical therapy including regular antiinflammatories: tylenol, flexeril, mobic, aleve Injections: No cervical epidural steroid injections  Past Surgery:  History of cervical fusion (?C5-C7) in 2005  Rodney Mahoney has no symptoms of cervical myelopathy.  The symptoms are causing a significant impact on the patient's life.   Review of Systems:  A 10 point review of systems is negative, except for the pertinent positives and negatives detailed in the HPI.  Past Medical History: Past Medical History:  Diagnosis Date   Bronchitis    CAD (coronary artery disease)    a. cath 11/26/2014 EF 45%, moderate to severe inferior hypokinesis, 90% small D1, 100% mid to distal RCA with minimal collateral, 40% mid to distal LCx. Medical therapy as RCA occlusion appears to be completed event; DES to mid circumflex October 2019   Essential hypertension    Hyperlipidemia    Ischemic cardiomyopathy    NSTEMI (non-ST elevated myocardial infarction) (HCC)    11/26/14, 01/31/18    Past Surgical History: Past Surgical  History:  Procedure Laterality Date   CARDIAC CATHETERIZATION N/A 11/26/2014   Procedure: Left Heart Cath and Coronary Angiography;  Surgeon: Peter M Swaziland, MD;  Location: [facility] INVASIVE CV LAB;  Service: Cardiovascular;  Laterality: N/A;   COLONOSCOPY     per patient: done in [place], age 56, couple of polyps.   COLONOSCOPY WITH PROPOFOL N/A 10/08/2022   Procedure: COLONOSCOPY WITH PROPOFOL;  Surgeon: Lanelle Bal, DO;  Location: [facility] ENDO SUITE;  Service: Endoscopy;  Laterality: N/A;  11:00 am   CORONARY STENT INTERVENTION N/A 01/31/2018   Procedure: CORONARY STENT INTERVENTION;  Surgeon: Runell Gess, MD;  Location: [facility] INVASIVE CV LAB;  Service: Cardiovascular;  Laterality: N/A;   LEFT HEART CATH AND CORONARY ANGIOGRAPHY N/A 01/31/2018   Procedure: LEFT HEART CATH AND CORONARY ANGIOGRAPHY;  Surgeon: Runell Gess, MD;  Location: [facility] INVASIVE CV LAB;  Service: Cardiovascular;  Laterality: N/A;   NECK SURGERY  2005    Allergies: Allergies as of 11/18/2022   (No Known Allergies)    Medications: Outpatient Encounter Medications as of 11/18/2022  Medication Sig   aspirin EC 81 MG tablet Take 1 tablet (81 mg total) by mouth daily. Swallow whole.   atorvastatin (LIPITOR) 80 MG tablet Take 1 tablet (80 mg total) by mouth daily. TAKE 1 TABLET BY MOUTH EVERY DAY AT 6 PM   cyclobenzaprine (FLEXERIL) 5 MG tablet Take 1 tablet (5 mg total) by mouth 3 (three) times daily as needed for muscle spasms.   dapagliflozin propanediol (FARXIGA) 10 MG TABS tablet Take 1 tablet (10 mg total) by mouth  daily before breakfast.   ezetimibe (ZETIA) 10 MG tablet Take 1 tablet (10 mg total) by mouth daily.   lisinopril (ZESTRIL) 10 MG tablet Take 1 tablet (10 mg total) by mouth daily.   meloxicam (MOBIC) 7.5 MG tablet Take 1 tablet (7.5 mg total) by mouth daily.   Menthol, Topical Analgesic, (BIOFREEZE EX) Apply 1 Application topically daily as needed (pain).   metoprolol succinate (TOPROL-XL) 25 MG 24 hr tablet  Take 1 tablet by mouth once daily   naproxen sodium (ALEVE) 220 MG tablet Take 440 mg by mouth 2 (two) times daily as needed (pain).   nitroGLYCERIN (NITROSTAT) 0.4 MG SL tablet Place 1 tablet (0.4 mg total) under the tongue every 5 (five) minutes as needed.   sildenafil (VIAGRA) 50 MG tablet TAKE 1 TABLET BY MOUTH ONCE DAILY AS NEEDED FOR ERECTILE DYSFUNCTION (Patient taking differently: Take 25 mg by mouth daily as needed for erectile dysfunction.)   No facility-administered encounter medications on file as of 11/18/2022.    Social History: Social History   Tobacco Use   Smoking status: Every Day    Packs/day: 1.00    Years: 42.00    Additional pack years: 0.00    Total pack years: 42.00    Types: Cigarettes   Smokeless tobacco: Never   Tobacco comments:    He used to quit and start back. He has not quit for the past 5 years.   Vaping Use   Vaping Use: Never used  Substance Use Topics   Alcohol use: Yes    Comment: 10 cans of beer   Drug use: Yes    Types: Marijuana    Comment: smokes week here and there    Family Medical History: Family History  Problem Relation Age of Onset   Diabetes Mother    Heart attack Father    Cancer Sister    Hypertension Brother    Colon cancer Neg Hx    Prostate cancer Neg Hx    Lung cancer Neg Hx     Physical Examination: There were no vitals filed for this visit.  General: Patient is well developed, well nourished, calm, collected, and in no apparent distress. Attention to examination is appropriate.  Respiratory: Patient is breathing without any difficulty.   NEUROLOGICAL:     Awake, alert, oriented to person, place, and time.  Speech is clear and fluent. Fund of knowledge is appropriate.   Cranial Nerves: Pupils equal round and reactive to light.  Facial tone is symmetric.    No posterior cervical tenderness. Mild tenderness in bilateral trapezial region, right > left.   No abnormal lesions on exposed skin.    Strength: Side Biceps Triceps Deltoid Interossei Grip Wrist Ext. Wrist Flex.  R 5 5 5 5 5 5 5   L 5 5 5 5 5 5 5    Side Iliopsoas Quads Hamstring PF DF EHL  R 5 5 5 5 5 5   L 5 5 5 5 5 5    Reflexes are 2+ and symmetric at the biceps, triceps, brachioradialis, patella and achilles.   Hoffman's is absent.  Clonus is not present.   Bilateral upper and lower extremity sensation is intact to light touch.     He has good ROM of his shoulders with only some minimal pain/tightness with forward flexion.   Gait is normal.     Medical Decision Making  Imaging: No cervical imaging.    Assessment and Plan: Mr. Haar is a pleasant 62 y.o. male has  history of cervical fusion (?C5-C7) in 2005.   10 month history of constant neck pain that radiates to both shoulders. Pain also goes into pectoral region on right > left. He has occasional numbness/tingling in right > left hand. He notes occasional weakness in arms.   No cervical imaging. Neck pain and numbness/tingling in arms appears cervical mediated.   Treatment options discussed with patient and following plan made:   - MRI of cervical spine to evaluate his symptoms. No relief with time, medications, or PT. He did PT x 3 visits (09/03/22, 09/24/22, 10/01/22).  - Depending on results of MRI, may consider injections. No help with previous PT.  - Will schedule phone visit to review MRI results once I get them back.   I spent a total of 30 minutes in face-to-face and non-face-to-face activities related to this patient's care today including review of outside records, review of imaging, review of symptoms, physical exam, discussion of differential diagnosis, discussion of treatment options, and documentation.   Thank you for involving me in the care of this patient.   Drake Leach PA-C Dept. of Neurosurgery

## 2022-11-18 ENCOUNTER — Ambulatory Visit (INDEPENDENT_AMBULATORY_CARE_PROVIDER_SITE_OTHER): Payer: 59 | Admitting: Orthopedic Surgery

## 2022-11-18 ENCOUNTER — Encounter: Payer: Self-pay | Admitting: Orthopedic Surgery

## 2022-11-18 VITALS — BP 130/82 | Ht 66.0 in | Wt 181.0 lb

## 2022-11-18 DIAGNOSIS — M542 Cervicalgia: Secondary | ICD-10-CM | POA: Diagnosis not present

## 2022-11-18 DIAGNOSIS — Z981 Arthrodesis status: Secondary | ICD-10-CM | POA: Diagnosis not present

## 2022-11-18 NOTE — Patient Instructions (Signed)
It was so nice to see you today. Thank you so much for coming in.    I think the pain in your neck and shoulders is likely coming from your neck.   I want to get an MRI of your neck to look into things further. We will get this approved through your insurance and [facility] will call you to schedule the appointment.   Once I get the MRI results back, we will call you to schedule a phone visit with me to review them.   Please do not hesitate to call if you have any questions or concerns. You can also message me in MyChart.   If you have not heard back about the MRI in the next week, please call the office so we can help you get it scheduled.   Drake Leach PA-C [phone]

## 2022-11-25 ENCOUNTER — Ambulatory Visit (INDEPENDENT_AMBULATORY_CARE_PROVIDER_SITE_OTHER): Payer: 59 | Admitting: Internal Medicine

## 2022-11-25 ENCOUNTER — Encounter: Payer: Self-pay | Admitting: Internal Medicine

## 2022-11-25 VITALS — BP 122/78 | HR 108 | Ht 66.0 in | Wt 188.6 lb

## 2022-11-25 DIAGNOSIS — Z9889 Other specified postprocedural states: Secondary | ICD-10-CM | POA: Diagnosis not present

## 2022-11-25 DIAGNOSIS — R7303 Prediabetes: Secondary | ICD-10-CM

## 2022-11-25 DIAGNOSIS — M542 Cervicalgia: Secondary | ICD-10-CM | POA: Diagnosis not present

## 2022-11-25 DIAGNOSIS — E785 Hyperlipidemia, unspecified: Secondary | ICD-10-CM | POA: Diagnosis not present

## 2022-11-25 DIAGNOSIS — I10 Essential (primary) hypertension: Secondary | ICD-10-CM

## 2022-11-25 DIAGNOSIS — I5022 Chronic systolic (congestive) heart failure: Secondary | ICD-10-CM | POA: Diagnosis not present

## 2022-11-25 DIAGNOSIS — I2510 Atherosclerotic heart disease of native coronary artery without angina pectoris: Secondary | ICD-10-CM

## 2022-11-25 NOTE — Progress Notes (Signed)
Established Patient Office Visit  Subjective   Patient ID: Rodney Mahoney, male    DOB: 13-Jan-1961  Age: 62 y.o. MRN: [id number]  Chief Complaint  Patient presents with   Results    Go over labs   Mr. Bauguess returns to care today for routine follow-up.  He was last evaluated by me on 5/15 for evaluation of persistent right-sided neck pain.  He was referred to neurosurgery at that time.  In the interim he was evaluated by neurosurgery on 6/5.  MRI of the cervical spine is pending.  There have otherwise been no acute interval events.  Mr. Ebert's acute concern today remains right-sided neck pain.  He now endorses pain on the left side of his neck as well.  Pain radiates to both arms.  He additionally endorses an intermittent pressure-like sensation on the right side of his chest.  His past medical history is significant for CAD with DES to the left circumflex in 2019.  Last seen by cardiology for follow-up in May 2023.  Past Medical History:  Diagnosis Date   Bronchitis    CAD (coronary artery disease)    a. cath 11/26/2014 EF 45%, moderate to severe inferior hypokinesis, 90% small D1, 100% mid to distal RCA with minimal collateral, 40% mid to distal LCx. Medical therapy as RCA occlusion appears to be completed event; DES to mid circumflex October 2019   Essential hypertension    Hyperlipidemia    Ischemic cardiomyopathy    NSTEMI (non-ST elevated myocardial infarction) (HCC)    11/26/14, 01/31/18   Past Surgical History:  Procedure Laterality Date   CARDIAC CATHETERIZATION N/A 11/26/2014   Procedure: Left Heart Cath and Coronary Angiography;  Surgeon: Peter M [place], MD;  Location: [facility] INVASIVE CV LAB;  Service: Cardiovascular;  Laterality: N/A;   COLONOSCOPY     per patient: done in [place], age 59, couple of polyps.   COLONOSCOPY WITH PROPOFOL N/A 10/08/2022   Procedure: COLONOSCOPY WITH PROPOFOL;  Surgeon: Lanelle Bal, DO;  Location: [facility] ENDO SUITE;  Service: Endoscopy;   Laterality: N/A;  11:00 am   CORONARY STENT INTERVENTION N/A 01/31/2018   Procedure: CORONARY STENT INTERVENTION;  Surgeon: Runell Gess, MD;  Location: [facility] INVASIVE CV LAB;  Service: Cardiovascular;  Laterality: N/A;   LEFT HEART CATH AND CORONARY ANGIOGRAPHY N/A 01/31/2018   Procedure: LEFT HEART CATH AND CORONARY ANGIOGRAPHY;  Surgeon: Runell Gess, MD;  Location: [facility] INVASIVE CV LAB;  Service: Cardiovascular;  Laterality: N/A;   NECK SURGERY  2005   Social History   Tobacco Use   Smoking status: Every Day    Packs/day: 1.00    Years: 42.00    Additional pack years: 0.00    Total pack years: 42.00    Types: Cigarettes   Smokeless tobacco: Never   Tobacco comments:    He used to quit and start back. He has not quit for the past 5 years.   Vaping Use   Vaping Use: Never used  Substance Use Topics   Alcohol use: Yes    Comment: 10 cans of beer   Drug use: Yes    Types: Marijuana    Comment: smokes week here and there   Family History  Problem Relation Age of Onset   Diabetes Mother    Heart attack Father    Cancer Sister    Hypertension Brother    Colon cancer Neg Hx    Prostate cancer Neg Hx    Lung cancer  Neg Hx    No Known Allergies  Review of Systems  Cardiovascular:  Positive for chest pain.  Musculoskeletal:  Positive for neck pain (R>L).     Objective:     BP 122/78   Pulse (!) 108   Ht 5\' 6"  (1.676 m)   Wt 188 lb 9.6 oz (85.5 kg)   SpO2 95%   BMI 30.44 kg/m  BP Readings from Last 3 Encounters:  12/02/22 117/74  11/25/22 122/78  11/18/22 130/82   Physical Exam Vitals reviewed.  Constitutional:      General: He is not in acute distress.    Appearance: Normal appearance. He is not ill-appearing.  HENT:     Head: Normocephalic and atraumatic.     Right Ear: External ear normal.     Left Ear: External ear normal.     Nose: Nose normal. No congestion or rhinorrhea.     Mouth/Throat:     Mouth: Mucous membranes are moist.     Pharynx:  Oropharynx is clear.  Eyes:     General: No scleral icterus.    Extraocular Movements: Extraocular movements intact.     Conjunctiva/sclera: Conjunctivae normal.     Pupils: Pupils are equal, round, and reactive to light.  Cardiovascular:     Rate and Rhythm: Normal rate and regular rhythm.     Pulses: Normal pulses.     Heart sounds: Normal heart sounds. No murmur heard. Pulmonary:     Effort: Pulmonary effort is normal.     Breath sounds: Normal breath sounds. No wheezing, rhonchi or rales.  Abdominal:     General: Abdomen is flat. Bowel sounds are normal. There is no distension.     Palpations: Abdomen is soft.     Tenderness: There is no abdominal tenderness.  Musculoskeletal:        General: No swelling or deformity.     Cervical back: Normal range of motion.     Comments: Rightward movements of the neck are reduced secondary to pain.  There is tenderness to palpation over the right trapezius.  No obvious deformities on inspection.  Strength and sensation are intact in the upper extremities bilaterally.  Skin:    General: Skin is warm and dry.     Capillary Refill: Capillary refill takes less than 2 seconds.  Neurological:     General: No focal deficit present.     Mental Status: He is alert and oriented to person, place, and time.     Motor: No weakness.  Psychiatric:        Mood and Affect: Mood normal.        Behavior: Behavior normal.        Thought Content: Thought content normal.   Last CBC Lab Results  Component Value Date   WBC 11.5 (H) 12/01/2022   HGB 12.8 (L) 12/01/2022   HCT 38.7 (L) 12/01/2022   MCV 86.2 12/01/2022   MCH 28.5 12/01/2022   RDW 14.1 12/01/2022   PLT 421 (H) 12/01/2022   Last metabolic panel Lab Results  Component Value Date   GLUCOSE 130 (H) 12/01/2022   NA 133 (L) 12/01/2022   K 4.4 12/01/2022   CL 97 (L) 12/01/2022   CO2 24 12/01/2022   BUN 13 12/01/2022   CREATININE 0.66 12/01/2022   EGFR 103 10/28/2022   CALCIUM 10.3  12/01/2022   PHOS 4.4 12/01/2022   PROT 7.2 10/28/2022   ALBUMIN 3.4 (L) 12/01/2022   LABGLOB 3.1 10/28/2022   AGRATIO  1.3 10/28/2022   BILITOT 0.3 10/28/2022   ALKPHOS 91 10/28/2022   AST 19 10/28/2022   ALT 22 10/28/2022   ANIONGAP 12 12/01/2022   Last lipids Lab Results  Component Value Date   CHOL 116 10/28/2022   HDL 39 (L) 10/28/2022   LDLCALC 59 10/28/2022   TRIG 93 10/28/2022   CHOLHDL 3.0 10/28/2022   Last hemoglobin A1c Lab Results  Component Value Date   HGBA1C 6.3 (H) 10/28/2022   Last thyroid functions Lab Results  Component Value Date   TSH 1.120 10/28/2022   Last vitamin D Lab Results  Component Value Date   VD25OH 34.7 10/28/2022   Last vitamin B12 and Folate Lab Results  Component Value Date   VITAMINB12 693 10/28/2022   FOLATE 5.6 10/28/2022     Assessment & Plan:   Problem List Items Addressed This Visit       Essential hypertension    Remains well-controlled on current antihypertensive regimen.  No medication changes are indicated today.      CAD (coronary artery disease), native coronary artery - Primary    He describes an intermittent, pressure-like sensation in the right side of his chest that seems associated with discomfort in his neck.  Denies chest pain currently.  MRI of the cervical spine is pending, however given his significant cardiac history I have recommended that he contact his cardiologist to schedule follow-up.      Chronic systolic CHF (congestive heart failure) (HCC)    Remains euvolemic on exam.  Currently prescribed Farxiga, lisinopril, and metoprolol succinate. -I have recommended that he contact his cardiologist to schedule follow-up.      Hyperlipidemia    Lipid panel updated last month.  Total cholesterol and LDL have both significantly improved since resuming atorvastatin 80 mg daily.  No additional medication changes are indicated today.      Prediabetes    A1c 6.3 on labs from last month.  He remains  focused on dietary modifications aimed at lowering his blood sugar.      Neck pain with history of cervical spinal surgery    Persistent issue.  He continues to endorse right-sided neck pain that radiates to both shoulders.  He has recently established care with neurosurgery given his history of cervical fusion in 2005.  MRI of the cervical spine is pending.  Symptoms have not progressed but also have not improved.  He is understandably frustrated.  He has previously attempted PT but this did not alleviate his symptoms. -MRI of the cervical spine is pending -Neurosurgery follow-up is pending MRI results       Return in about 3 months (around 02/25/2023).    Billie Lade, MD

## 2022-11-25 NOTE — Patient Instructions (Signed)
It was a pleasure to see you today.  Thank you for giving [place] the opportunity to be involved in your care.  Below is a brief recap of your visit and next steps.  We will plan to see you again in 3 months.  Summary No medication changes today I recommend scheduling follow up with your cardiologist Will try to assist with scheduling MRI Follow up in 3 months

## 2022-11-30 ENCOUNTER — Emergency Department (HOSPITAL_COMMUNITY): Payer: 59

## 2022-11-30 ENCOUNTER — Ambulatory Visit (HOSPITAL_COMMUNITY)
Admission: RE | Admit: 2022-11-30 | Discharge: 2022-11-30 | Disposition: A | Discharge status: Home / Self Care | Payer: 59 | Source: Ambulatory Visit | Attending: Orthopedic Surgery | Admitting: Orthopedic Surgery

## 2022-11-30 ENCOUNTER — Observation Stay (HOSPITAL_COMMUNITY)
Admission: EM | Admit: 2022-11-30 | Discharge: 2022-12-02 | Disposition: A | Discharge status: Home / Self Care | Payer: 59 | Attending: Student | Admitting: Student

## 2022-11-30 ENCOUNTER — Other Ambulatory Visit: Payer: Self-pay

## 2022-11-30 ENCOUNTER — Encounter (HOSPITAL_COMMUNITY): Payer: Self-pay | Admitting: Emergency Medicine

## 2022-11-30 DIAGNOSIS — R0602 Shortness of breath: Secondary | ICD-10-CM | POA: Diagnosis not present

## 2022-11-30 DIAGNOSIS — Z981 Arthrodesis status: Secondary | ICD-10-CM | POA: Insufficient documentation

## 2022-11-30 DIAGNOSIS — I110 Hypertensive heart disease with heart failure: Secondary | ICD-10-CM | POA: Insufficient documentation

## 2022-11-30 DIAGNOSIS — R079 Chest pain, unspecified: Secondary | ICD-10-CM | POA: Diagnosis not present

## 2022-11-30 DIAGNOSIS — Z955 Presence of coronary angioplasty implant and graft: Secondary | ICD-10-CM | POA: Insufficient documentation

## 2022-11-30 DIAGNOSIS — M542 Cervicalgia: Secondary | ICD-10-CM | POA: Insufficient documentation

## 2022-11-30 DIAGNOSIS — Z720 Tobacco use: Secondary | ICD-10-CM | POA: Diagnosis present

## 2022-11-30 DIAGNOSIS — F17210 Nicotine dependence, cigarettes, uncomplicated: Secondary | ICD-10-CM | POA: Insufficient documentation

## 2022-11-30 DIAGNOSIS — I2510 Atherosclerotic heart disease of native coronary artery without angina pectoris: Secondary | ICD-10-CM | POA: Diagnosis present

## 2022-11-30 DIAGNOSIS — F17200 Nicotine dependence, unspecified, uncomplicated: Secondary | ICD-10-CM | POA: Diagnosis not present

## 2022-11-30 DIAGNOSIS — R0789 Other chest pain: Secondary | ICD-10-CM | POA: Diagnosis not present

## 2022-11-30 DIAGNOSIS — Z7982 Long term (current) use of aspirin: Secondary | ICD-10-CM | POA: Insufficient documentation

## 2022-11-30 DIAGNOSIS — C3412 Malignant neoplasm of upper lobe, left bronchus or lung: Secondary | ICD-10-CM | POA: Diagnosis not present

## 2022-11-30 DIAGNOSIS — I10 Essential (primary) hypertension: Secondary | ICD-10-CM | POA: Diagnosis present

## 2022-11-30 DIAGNOSIS — C3490 Malignant neoplasm of unspecified part of unspecified bronchus or lung: Secondary | ICD-10-CM | POA: Diagnosis not present

## 2022-11-30 DIAGNOSIS — Z79899 Other long term (current) drug therapy: Secondary | ICD-10-CM | POA: Insufficient documentation

## 2022-11-30 DIAGNOSIS — I5022 Chronic systolic (congestive) heart failure: Secondary | ICD-10-CM | POA: Diagnosis present

## 2022-11-30 DIAGNOSIS — R918 Other nonspecific abnormal finding of lung field: Secondary | ICD-10-CM | POA: Diagnosis not present

## 2022-11-30 DIAGNOSIS — Z1152 Encounter for screening for COVID-19: Secondary | ICD-10-CM | POA: Insufficient documentation

## 2022-11-30 DIAGNOSIS — E785 Hyperlipidemia, unspecified: Secondary | ICD-10-CM | POA: Diagnosis present

## 2022-11-30 LAB — BASIC METABOLIC PANEL
Anion gap: 9 (ref 5–15)
BUN: 15 mg/dL (ref 8–23)
CO2: 26 mmol/L (ref 22–32)
Calcium: 9.9 mg/dL (ref 8.9–10.3)
Chloride: 100 mmol/L (ref 98–111)
Creatinine, Ser: 0.82 mg/dL (ref 0.61–1.24)
GFR, Estimated: >60 mL/min (ref >60)
Glucose, Bld: 97 mg/dL (ref 70–99)
Potassium: 4.7 mmol/L (ref 3.5–5.1)
Sodium: 135 mmol/L (ref 135–145)

## 2022-11-30 LAB — TROPONIN I (HIGH SENSITIVITY)
Troponin I (High Sensitivity): 9 ng/L (ref <18)
Troponin I (High Sensitivity): 9 ng/L (ref <18)

## 2022-11-30 LAB — CBC
HCT: 38.9 % — ABNORMAL LOW (ref 39.0–52.0)
Hemoglobin: 12.5 g/dL — ABNORMAL LOW (ref 13.0–17.0)
MCH: 28.7 pg (ref 26.0–34.0)
MCHC: 32.1 g/dL (ref 30.0–36.0)
MCV: 89.2 fL (ref 80.0–100.0)
Platelets: 389 10*3/uL (ref 150–400)
RBC: 4.36 MIL/uL (ref 4.22–5.81)
RDW: 14.6 % (ref 11.5–15.5)
WBC: 14.1 10*3/uL — ABNORMAL HIGH (ref 4.0–10.5)
nRBC: 0 % (ref 0.0–0.2)

## 2022-11-30 LAB — PROTIME-INR
INR: 1.2 (ref 0.8–1.2)
Prothrombin Time: 15.1 seconds (ref 11.4–15.2)

## 2022-11-30 MED ORDER — IOHEXOL 350 MG/ML SOLN
75.0000 mL | Freq: Once | INTRAVENOUS | Status: AC | PRN
  Administered 2022-11-30: 75 mL via INTRAVENOUS

## 2022-11-30 MED ORDER — METHYLPREDNISOLONE SODIUM SUCC 125 MG IJ SOLR
125.0000 mg | Freq: Once | INTRAMUSCULAR | Status: AC
  Administered 2022-11-30: 125 mg via INTRAVENOUS
  Filled 2022-11-30: qty 2

## 2022-11-30 MED ORDER — IPRATROPIUM-ALBUTEROL 0.5-2.5 (3) MG/3ML IN SOLN
3.0000 mL | Freq: Once | RESPIRATORY_TRACT | Status: AC
  Administered 2022-11-30: 3 mL via RESPIRATORY_TRACT
  Filled 2022-11-30: qty 3

## 2022-11-30 NOTE — ED Triage Notes (Signed)
Pt via POV c/o left-sided chest pain radiating to neck and down left arm. Pt states it feels like there is a weight on his chest and he feels like his skin on his torso is itchy. Pt feels SOB and says pain is worse with deep inhalation. Hx 2 previus MI, stents, neck surgery.

## 2022-11-30 NOTE — Progress Notes (Signed)
Order placed for cervical x-rays

## 2022-11-30 NOTE — ED Provider Notes (Signed)
[place] EMERGENCY DEPARTMENT AT [facility] Provider Note   CSN: [id number] Arrival date & time: 11/30/22  1438     History  Chief Complaint  Patient presents with   Shortness of Breath   Chest Pain    Rodney Mahoney is a 61 y.o. male.  Patient is a  63yo male with pmh of CAD, HTN, hyperlipidemia, MI with coronary angiography presenting for chest pain. Pt states chest pain is located on left and right side, described as "just hurting, pressure, like something is sitting on my chest", with coughing or deep breathing, intermittent, x 1 week. States pain was worse upon waking yesterday morning. Hx of cough for several weeks. Denies fever or chills. Patient is an active smoker, 1 pack a day for 40-42 years.  States he is compliant with daily asa. Admits to intermittant wheezing. Dx prior dx of copd. No home inhalers. No home oxygen.   The history is provided by the patient. No language interpreter was used.  Shortness of Breath Associated symptoms: chest pain and cough   Associated symptoms: no abdominal pain, no ear pain, no fever, no rash, no sore throat and no vomiting   Chest Pain Associated symptoms: cough and shortness of breath   Associated symptoms: no abdominal pain, no back pain, no fever, no palpitations and no vomiting        Home Medications Prior to Admission medications   Medication Sig Start Date End Date Taking? Authorizing Provider  aspirin EC 81 MG tablet Take 1 tablet (81 mg total) by mouth daily. Swallow whole. 06/27/21   Furth, Cadence H, PA-C  atorvastatin (LIPITOR) 80 MG tablet Take 1 tablet (80 mg total) by mouth daily. TAKE 1 TABLET BY MOUTH EVERY DAY AT 6 PM 08/25/22 02/21/23  Billie Lade, MD  dapagliflozin propanediol (FARXIGA) 10 MG TABS tablet Take 1 tablet (10 mg total) by mouth daily before breakfast. 08/25/22   Billie Lade, MD  ezetimibe (ZETIA) 10 MG tablet Take 1 tablet (10 mg total) by mouth daily. 12/03/21   Paseda, Baird Kay, FNP  lisinopril (ZESTRIL) 10 MG tablet Take 1 tablet (10 mg total) by mouth daily. 10/20/21 11/25/22  [name], MD  Menthol, Topical Analgesic, (BIOFREEZE EX) Apply 1 Application topically daily as needed (pain).    [provider]  metoprolol succinate (TOPROL-XL) 25 MG 24 hr tablet Take 1 tablet by mouth once daily 09/30/22   Billie Lade, MD  naproxen sodium (ALEVE) 220 MG tablet Take 440 mg by mouth 2 (two) times daily as needed (pain).    [provider]  nitroGLYCERIN (NITROSTAT) 0.4 MG SL tablet Place 1 tablet (0.4 mg total) under the tongue every 5 (five) minutes as needed. 02/02/18   Arty Baumgartner, NP  sildenafil (VIAGRA) 50 MG tablet TAKE 1 TABLET BY MOUTH ONCE DAILY AS NEEDED FOR ERECTILE DYSFUNCTION Patient taking differently: Take 25 mg by mouth daily as needed for erectile dysfunction. 09/30/22   Billie Lade, MD      Allergies    Patient has no known allergies.    Review of Systems   Review of Systems  Constitutional:  Negative for chills and fever.  HENT:  Negative for ear pain and sore throat.   Eyes:  Negative for pain and visual disturbance.  Respiratory:  Positive for cough and shortness of breath.   Cardiovascular:  Positive for chest pain. Negative for palpitations.  Gastrointestinal:  Negative for abdominal pain and  vomiting.  Genitourinary:  Negative for dysuria and hematuria.  Musculoskeletal:  Negative for arthralgias and back pain.  Skin:  Negative for color change and rash.  Neurological:  Negative for seizures and syncope.  All other systems reviewed and are negative.   Physical Exam Updated Vital Signs BP 131/65   Pulse 99   Temp 98.2 F (36.8 C)   Resp (!) 33   Ht 5\' 6"  (1.676 m)   Wt 85.3 kg   SpO2 95%   BMI 30.34 kg/m  Physical Exam Vitals and nursing note reviewed.  Constitutional:      General: He is not in acute distress.    Appearance: He is well-developed.  HENT:     Head: Normocephalic and  atraumatic.  Eyes:     Conjunctiva/sclera: Conjunctivae normal.  Cardiovascular:     Rate and Rhythm: Normal rate and regular rhythm.     Heart sounds: No murmur heard. Pulmonary:     Effort: Pulmonary effort is normal. No respiratory distress.     Breath sounds: Wheezing present.  Abdominal:     Palpations: Abdomen is soft.     Tenderness: There is no abdominal tenderness.  Musculoskeletal:        General: No swelling.     Cervical back: Neck supple.  Skin:    General: Skin is warm and dry.     Capillary Refill: Capillary refill takes less than 2 seconds.  Neurological:     Mental Status: He is alert.  Psychiatric:        Mood and Affect: Mood normal.     ED Results / Procedures / Treatments   Labs (all labs ordered are listed, but only abnormal results are displayed) Labs Reviewed  CBC - Abnormal; Notable for the following components:      Result Value   WBC 14.1 (*)    Hemoglobin 12.5 (*)    HCT 38.9 (*)    All other components within normal limits  BASIC METABOLIC PANEL  PROTIME-INR  TROPONIN I (HIGH SENSITIVITY)  TROPONIN I (HIGH SENSITIVITY)    EKG EKG Interpretation  Date/Time:  Monday November 30 2022 14:57:45 EDT Ventricular Rate:  115 PR Interval:  138 QRS Duration: 106 QT Interval:  342 QTC Calculation: 473 R Axis:   18 Text Interpretation: Sinus tachycardia with frequent Premature ventricular complexes Possible Left atrial enlargement Minimal voltage criteria for LVH, may be normal variant ( R in aVL ) Cannot rule out Inferior infarct , age undetermined Abnormal ECG When compared with ECG of 19-Mar-2021 09:41, PREVIOUS ECG IS PRESENT Confirmed by Edwin Dada ([id number]) on 11/30/2022 8:34:47 PM  Radiology CT Angio Chest PE W and/or Wo Contrast  Result Date: 11/30/2022 CLINICAL DATA:  High probability for PE with left-sided chest pain. EXAM: CT ANGIOGRAPHY CHEST WITH CONTRAST TECHNIQUE: Multidetector CT imaging of the chest was performed using the standard  protocol during bolus administration of intravenous contrast. Multiplanar CT image reconstructions and MIPs were obtained to evaluate the vascular anatomy. RADIATION DOSE REDUCTION: This exam was performed according to the departmental dose-optimization program which includes automated exposure control, adjustment of the mA and/or kV according to patient size and/or use of iterative reconstruction technique. CONTRAST:  75mL OMNIPAQUE IOHEXOL 350 MG/ML SOLN COMPARISON:  CT PE protocol 11/26/2014 FINDINGS: Cardiovascular: Satisfactory opacification of the pulmonary arteries to the segmental level. No evidence of pulmonary embolism. Normal heart size. No pericardial effusion. There are atherosclerotic calcifications of the aorta. Mediastinum/Nodes: There is bulky prevascular lymphadenopathy measuring 5.9  x 3.1 cm. There are multiple enlarged left hilar lymph nodes the largest measuring 1.9 by 2.3 cm. There are enlarged lymph nodes in the lower left neck with the largest lymph node measuring 2.2 by 4.3 cm lateral to the thyroid gland. Confluence soft tissue is seen abutting in between the left common carotid and left subclavian artery measuring 2.0 x 4.0 cm image 4/19. Visualized thyroid gland and esophagus are within normal limits. Lungs/Pleura: There is a left upper lobe mass measuring 8.0 x 6.0 x 7.1 cm. This abuts the mediastinum and right hilum. Central area of low attenuation and air likely related to necrosis. There is a right upper lobe nodule measuring 8 mm image 6/60. There is no pleural effusion or pneumothorax. There is cutoff of the left upper lobe bronchus. Upper Abdomen: No acute abnormality. Musculoskeletal: Lytic lesion with soft tissue component in the region of the right superior sternum is present with soft tissue component extending into the anterior mediastinum. Anterior mediastinal soft tissue component measures 6.2 x 2.2 x 3.8 cm. Cervical spinal fusion plate is present. Review of the MIP images  confirms the above findings. IMPRESSION: 1. No evidence for pulmonary embolism. 2. Left upper lobe mass with central necrosis worrisome for primary bronchogenic carcinoma. 3. Bulky mediastinal, left hilar and left lower neck lymphadenopathy worrisome for metastatic disease. 4. Lytic lesion in the right superior sternum with soft tissue component extending into the anterior mediastinum worrisome for metastatic disease. 5. 8 mm right upper lobe pulmonary nodule worrisome for metastatic disease. Aortic Atherosclerosis (ICD10-I70.0). Electronically Signed   By: Darliss Cheney M.D.   On: 11/30/2022 22:47   DG Cervical Spine 2 or 3 views  Result Date: 11/30/2022 CLINICAL DATA:  Neck pain without recent injury. EXAM: CERVICAL SPINE - 2-3 VIEW COMPARISON:  December 08, 2003. FINDINGS: Status post surgical anterior fusion of C5-6 and C6-7. No acute fracture or spondylolisthesis is noted. Large anterior osteophyte formation is noted at C4-5. No prevertebral soft tissue swelling is noted. IMPRESSION: Postsurgical and degenerative changes as described above. No acute abnormality seen. Electronically Signed   By: Lupita Raider M.D.   On: 11/30/2022 16:14   DG Chest 2 View  Result Date: 11/30/2022 CLINICAL DATA:  Chest pain. EXAM: CHEST - 2 VIEW COMPARISON:  March 19, 2021. FINDINGS: Stable cardiomediastinal silhouette. Large lobulated left upper lobe mass is noted concerning for malignancy. Right lung is clear. Bony thorax is unremarkable. IMPRESSION: Left upper lobe density is noted most consistent with left upper lobe mass or malignancy with associated atelectasis. CT scan of the chest with intravenous contrast is recommended for further evaluation. Electronically Signed   By: Lupita Raider M.D.   On: 11/30/2022 16:11    Procedures .Critical Care  Performed by: Franne Forts, DO Authorized by: Franne Forts, DO   Critical care provider statement:    Critical care time (minutes):  75   Critical care was time  spent personally by me on the following activities:  Development of treatment plan with patient or surrogate, discussions with consultants, evaluation of patient's response to treatment, examination of patient, ordering and review of laboratory studies, ordering and review of radiographic studies, ordering and performing treatments and interventions, pulse oximetry, re-evaluation of patient's condition and review of old charts (new lung cancer)   Care discussed with: admitting provider     Care discussed with comment:  Pulmonology and admitting provider     Medications Ordered in ED Medications  ipratropium-albuterol (DUONEB) 0.5-2.5 (  3) MG/3ML nebulizer solution 3 mL (3 mLs Nebulization Given 11/30/22 2124)  methylPREDNISolone sodium succinate (SOLU-MEDROL) 125 mg/2 mL injection 125 mg (125 mg Intravenous Given 11/30/22 2125)  iohexol (OMNIPAQUE) 350 MG/ML injection 75 mL (75 mLs Intravenous Contrast Given 11/30/22 2224)    ED Course/ Medical Decision Making/ A&P Clinical Course as of 11/30/22 2359  Mon Nov 30, 2022  2035 DG Chest 2 View [AG]    Clinical Course User Index [AG] Franne Forts, DO                             Medical Decision Making Amount and/or Complexity of Data Reviewed Labs: ordered. Radiology: ordered. Decision-making details documented in ED Course.  Risk Prescription drug management.   62 year old male, active smoker, coming in for generalized chest pain and shortness of breath.  Patient is alert and oriented x 3, no acute distress, afebrile, stable vital signs.  No hypoxia.  Equal bilateral breath sounds. Wheezing on exam. Solumedrol and duoneb ordered for possible COPD. ECG and troponin's stable. CXR concerning for opacity versus mass.  CT ordered and concerning for multiple lung masses with mediastinal involvement.  Patient notified of these findings.  I spoke with on-call pulmonologist who recommends admission for biopsy.  I spoke with admitting physician who  agrees to transfer patient to [facility] for admission.        Final Clinical Impression(s) / ED Diagnoses Final diagnoses:  Chest pain, unspecified type  Malignant neoplasm of lung, unspecified laterality, unspecified part of lung (HCC)  Current smoker  SOB (shortness of breath)    Rx / DC Orders ED Discharge Orders     None         Franne Forts, DO 11/30/22 2359

## 2022-12-01 ENCOUNTER — Inpatient Hospital Stay (HOSPITAL_BASED_OUTPATIENT_CLINIC_OR_DEPARTMENT_OTHER): Payer: 59 | Admitting: Registered Nurse

## 2022-12-01 ENCOUNTER — Encounter (HOSPITAL_COMMUNITY)
Admission: EM | Disposition: A | Discharge status: Home / Self Care | Payer: Self-pay | Source: Home / Self Care | Attending: Student

## 2022-12-01 ENCOUNTER — Observation Stay (HOSPITAL_COMMUNITY): Payer: 59

## 2022-12-01 ENCOUNTER — Inpatient Hospital Stay (HOSPITAL_COMMUNITY): Payer: 59 | Admitting: Registered Nurse

## 2022-12-01 DIAGNOSIS — Z7982 Long term (current) use of aspirin: Secondary | ICD-10-CM | POA: Diagnosis not present

## 2022-12-01 DIAGNOSIS — E785 Hyperlipidemia, unspecified: Secondary | ICD-10-CM

## 2022-12-01 DIAGNOSIS — I2510 Atherosclerotic heart disease of native coronary artery without angina pectoris: Secondary | ICD-10-CM | POA: Diagnosis not present

## 2022-12-01 DIAGNOSIS — C3412 Malignant neoplasm of upper lobe, left bronchus or lung: Secondary | ICD-10-CM | POA: Diagnosis not present

## 2022-12-01 DIAGNOSIS — I10 Essential (primary) hypertension: Secondary | ICD-10-CM | POA: Diagnosis not present

## 2022-12-01 DIAGNOSIS — I110 Hypertensive heart disease with heart failure: Secondary | ICD-10-CM

## 2022-12-01 DIAGNOSIS — I5022 Chronic systolic (congestive) heart failure: Secondary | ICD-10-CM

## 2022-12-01 DIAGNOSIS — I252 Old myocardial infarction: Secondary | ICD-10-CM | POA: Diagnosis not present

## 2022-12-01 DIAGNOSIS — R0602 Shortness of breath: Secondary | ICD-10-CM | POA: Diagnosis not present

## 2022-12-01 DIAGNOSIS — Z79899 Other long term (current) drug therapy: Secondary | ICD-10-CM | POA: Diagnosis not present

## 2022-12-01 DIAGNOSIS — F17210 Nicotine dependence, cigarettes, uncomplicated: Secondary | ICD-10-CM

## 2022-12-01 DIAGNOSIS — R918 Other nonspecific abnormal finding of lung field: Secondary | ICD-10-CM

## 2022-12-01 DIAGNOSIS — I509 Heart failure, unspecified: Secondary | ICD-10-CM

## 2022-12-01 DIAGNOSIS — Z720 Tobacco use: Secondary | ICD-10-CM | POA: Diagnosis not present

## 2022-12-01 DIAGNOSIS — Z1152 Encounter for screening for COVID-19: Secondary | ICD-10-CM | POA: Diagnosis not present

## 2022-12-01 DIAGNOSIS — Z955 Presence of coronary angioplasty implant and graft: Secondary | ICD-10-CM | POA: Diagnosis not present

## 2022-12-01 DIAGNOSIS — R079 Chest pain, unspecified: Secondary | ICD-10-CM | POA: Diagnosis not present

## 2022-12-01 DIAGNOSIS — F17200 Nicotine dependence, unspecified, uncomplicated: Secondary | ICD-10-CM | POA: Diagnosis not present

## 2022-12-01 HISTORY — PX: BRONCHIAL BIOPSY: SHX5109

## 2022-12-01 HISTORY — PX: BRONCHIAL WASHINGS: SHX5105

## 2022-12-01 HISTORY — PX: HEMOSTASIS CONTROL: SHX6838

## 2022-12-01 HISTORY — PX: VIDEO BRONCHOSCOPY: SHX5072

## 2022-12-01 HISTORY — PX: BRONCHIAL BRUSHINGS: SHX5108

## 2022-12-01 LAB — AEROBIC/ANAEROBIC CULTURE W GRAM STAIN (SURGICAL/DEEP WOUND)

## 2022-12-01 LAB — CBC
HCT: 38.7 % — ABNORMAL LOW (ref 39.0–52.0)
Hemoglobin: 12.8 g/dL — ABNORMAL LOW (ref 13.0–17.0)
MCH: 28.5 pg (ref 26.0–34.0)
MCHC: 33.1 g/dL (ref 30.0–36.0)
MCV: 86.2 fL (ref 80.0–100.0)
Platelets: 421 10*3/uL — ABNORMAL HIGH (ref 150–400)
RBC: 4.49 MIL/uL (ref 4.22–5.81)
RDW: 14.1 % (ref 11.5–15.5)
WBC: 11.5 10*3/uL — ABNORMAL HIGH (ref 4.0–10.5)
nRBC: 0 % (ref 0.0–0.2)

## 2022-12-01 LAB — RENAL FUNCTION PANEL
Albumin: 3.4 g/dL — ABNORMAL LOW (ref 3.5–5.0)
Anion gap: 12 (ref 5–15)
BUN: 13 mg/dL (ref 8–23)
CO2: 24 mmol/L (ref 22–32)
Calcium: 10.3 mg/dL (ref 8.9–10.3)
Chloride: 97 mmol/L — ABNORMAL LOW (ref 98–111)
Creatinine, Ser: 0.66 mg/dL (ref 0.61–1.24)
GFR, Estimated: >60 mL/min (ref >60)
Glucose, Bld: 130 mg/dL — ABNORMAL HIGH (ref 70–99)
Phosphorus: 4.4 mg/dL (ref 2.5–4.6)
Potassium: 4.4 mmol/L (ref 3.5–5.1)
Sodium: 133 mmol/L — ABNORMAL LOW (ref 135–145)

## 2022-12-01 LAB — HIV ANTIBODY (ROUTINE TESTING W REFLEX): HIV Screen 4th Generation wRfx: NONREACTIVE

## 2022-12-01 LAB — CULTURE, BAL-QUANTITATIVE W GRAM STAIN

## 2022-12-01 LAB — MAGNESIUM: Magnesium: 2.2 mg/dL (ref 1.7–2.4)

## 2022-12-01 SURGERY — BRONCHOSCOPY, WITH FLUOROSCOPY
Anesthesia: General

## 2022-12-01 MED ORDER — PHENYLEPHRINE HCL-NACL 20-0.9 MG/250ML-% IV SOLN
INTRAVENOUS | Status: DC | PRN
  Administered 2022-12-01: 40 ug/min via INTRAVENOUS

## 2022-12-01 MED ORDER — ONDANSETRON HCL 4 MG/2ML IJ SOLN
INTRAMUSCULAR | Status: DC | PRN
  Administered 2022-12-01: 4 mg via INTRAVENOUS

## 2022-12-01 MED ORDER — ONDANSETRON HCL 4 MG PO TABS: 4.0000 mg | ORAL_TABLET | Freq: Four times a day (QID) | ORAL | Status: DC | PRN

## 2022-12-01 MED ORDER — LIDOCAINE 2% (20 MG/ML) 5 ML SYRINGE
INTRAMUSCULAR | Status: DC | PRN
  Administered 2022-12-01: 60 mg via INTRAVENOUS

## 2022-12-01 MED ORDER — HYDROCODONE BIT-HOMATROP MBR 5-1.5 MG/5ML PO SOLN
5.0000 mL | Freq: Four times a day (QID) | ORAL | Status: DC | PRN
  Administered 2022-12-01 – 2022-12-02 (×2): 5 mL via ORAL
  Filled 2022-12-01 (×2): qty 5

## 2022-12-01 MED ORDER — METOPROLOL SUCCINATE ER 25 MG PO TB24
25.0000 mg | ORAL_TABLET | Freq: Every day | ORAL | Status: DC
  Administered 2022-12-01 – 2022-12-02 (×2): 25 mg via ORAL
  Filled 2022-12-01 (×2): qty 1

## 2022-12-01 MED ORDER — ENSURE ENLIVE PO LIQD
237.0000 mL | Freq: Two times a day (BID) | ORAL | Status: DC
  Administered 2022-12-02: 237 mL via ORAL

## 2022-12-01 MED ORDER — REVEFENACIN 175 MCG/3ML IN SOLN
175.0000 ug | Freq: Every day | RESPIRATORY_TRACT | Status: DC
  Administered 2022-12-02: 175 ug via RESPIRATORY_TRACT
  Filled 2022-12-01 (×2): qty 3

## 2022-12-01 MED ORDER — LISINOPRIL 10 MG PO TABS
10.0000 mg | ORAL_TABLET | Freq: Every day | ORAL | Status: DC
  Administered 2022-12-01 – 2022-12-02 (×2): 10 mg via ORAL
  Filled 2022-12-01 (×2): qty 1

## 2022-12-01 MED ORDER — FENTANYL CITRATE (PF) 250 MCG/5ML IJ SOLN
INTRAMUSCULAR | Status: DC | PRN
  Administered 2022-12-01: 50 ug via INTRAVENOUS

## 2022-12-01 MED ORDER — DEXAMETHASONE SODIUM PHOSPHATE 10 MG/ML IJ SOLN
INTRAMUSCULAR | Status: DC | PRN
  Administered 2022-12-01: 10 mg via INTRAVENOUS

## 2022-12-01 MED ORDER — POLYETHYLENE GLYCOL 3350 17 G PO PACK: 17.0000 g | PACK | Freq: Two times a day (BID) | ORAL | Status: DC | PRN

## 2022-12-01 MED ORDER — ACETAMINOPHEN 325 MG PO TABS: 650.0000 mg | ORAL_TABLET | Freq: Four times a day (QID) | ORAL | Status: DC | PRN

## 2022-12-01 MED ORDER — IPRATROPIUM-ALBUTEROL 0.5-2.5 (3) MG/3ML IN SOLN: 3.0000 mL | Freq: Four times a day (QID) | RESPIRATORY_TRACT | Status: DC | PRN

## 2022-12-01 MED ORDER — SUGAMMADEX SODIUM 200 MG/2ML IV SOLN
INTRAVENOUS | Status: DC | PRN
  Administered 2022-12-01: 300 mg via INTRAVENOUS

## 2022-12-01 MED ORDER — ACETAMINOPHEN 10 MG/ML IV SOLN
1000.0000 mg | Freq: Once | INTRAVENOUS | Status: DC | PRN
Start: 2022-12-01 — End: 2022-12-01

## 2022-12-01 MED ORDER — ATORVASTATIN CALCIUM 80 MG PO TABS
80.0000 mg | ORAL_TABLET | Freq: Every day | ORAL | Status: DC
  Administered 2022-12-01: 80 mg via ORAL
  Filled 2022-12-01: qty 1

## 2022-12-01 MED ORDER — BUDESONIDE 0.5 MG/2ML IN SUSP
0.5000 mg | Freq: Two times a day (BID) | RESPIRATORY_TRACT | Status: DC
  Administered 2022-12-01 – 2022-12-02 (×2): 0.5 mg via RESPIRATORY_TRACT
  Filled 2022-12-01 (×2): qty 2

## 2022-12-01 MED ORDER — ARFORMOTEROL TARTRATE 15 MCG/2ML IN NEBU
15.0000 ug | INHALATION_SOLUTION | Freq: Two times a day (BID) | RESPIRATORY_TRACT | Status: DC
  Administered 2022-12-01 – 2022-12-02 (×2): 15 ug via RESPIRATORY_TRACT
  Filled 2022-12-01 (×2): qty 2

## 2022-12-01 MED ORDER — PROPOFOL 500 MG/50ML IV EMUL
INTRAVENOUS | Status: DC | PRN
  Administered 2022-12-01: 100 ug/kg/min via INTRAVENOUS

## 2022-12-01 MED ORDER — ONDANSETRON HCL 4 MG/2ML IJ SOLN: 4.0000 mg | Freq: Four times a day (QID) | INTRAMUSCULAR | Status: DC | PRN

## 2022-12-01 MED ORDER — PROPOFOL 10 MG/ML IV BOLUS
INTRAVENOUS | Status: DC | PRN
  Administered 2022-12-01: 20 mg via INTRAVENOUS
  Administered 2022-12-01: 150 mg via INTRAVENOUS
  Administered 2022-12-01: 30 mg via INTRAVENOUS

## 2022-12-01 MED ORDER — ROCURONIUM BROMIDE 10 MG/ML (PF) SYRINGE
PREFILLED_SYRINGE | INTRAVENOUS | Status: DC | PRN
  Administered 2022-12-01: 10 mg via INTRAVENOUS
  Administered 2022-12-01: 50 mg via INTRAVENOUS

## 2022-12-01 MED ORDER — ACETAMINOPHEN 650 MG RE SUPP: 650.0000 mg | Freq: Four times a day (QID) | RECTAL | Status: DC | PRN

## 2022-12-01 MED ORDER — EZETIMIBE 10 MG PO TABS
10.0000 mg | ORAL_TABLET | Freq: Every day | ORAL | Status: DC
  Administered 2022-12-01 – 2022-12-02 (×2): 10 mg via ORAL
  Filled 2022-12-01 (×2): qty 1

## 2022-12-01 MED ORDER — PHENYLEPHRINE 80 MCG/ML (10ML) SYRINGE FOR IV PUSH (FOR BLOOD PRESSURE SUPPORT)
PREFILLED_SYRINGE | INTRAVENOUS | Status: DC | PRN
  Administered 2022-12-01: 160 ug via INTRAVENOUS
  Administered 2022-12-01: 80 ug via INTRAVENOUS
  Administered 2022-12-01 (×2): 160 ug via INTRAVENOUS

## 2022-12-01 MED ORDER — SENNOSIDES-DOCUSATE SODIUM 8.6-50 MG PO TABS: 1.0000 | ORAL_TABLET | Freq: Two times a day (BID) | ORAL | Status: DC | PRN

## 2022-12-01 MED ORDER — LACTATED RINGERS IV SOLN: INTRAVENOUS | Status: DC

## 2022-12-01 MED ORDER — FENTANYL CITRATE (PF) 100 MCG/2ML IJ SOLN
25.0000 ug | INTRAMUSCULAR | Status: DC | PRN
Start: 2022-12-01 — End: 2022-12-01

## 2022-12-01 NOTE — H&P (Signed)
History and Physical    Rodney Mahoney WUJ:[id number] DOB: August 23, 1960 DOA: 11/30/2022  PCP: Billie Lade, MD Patient coming from: Home  Chief Complaint: Shortness of breath  HPI: Rodney Mahoney is a 62 year old M with PMH of CAD, systolic CHF, HTN, HLD and tobacco use disorder (1-2 PPD) presented to [facility] with shortness of breath for about a week and associated chest discomfort and found to have LUL mass concerning for bronchogenic carcinoma with upper sternal lesion and 8 mm RUL nodule.    Patient is not a great historian.  Reports shortness of breath for about a week.  Also report chest wall pain.  He was seen by his PCP about a month ago for neck pain for about 10 months with history of cervical spinal surgery.  He had ambulatory referral to neurosurgery as well as x-ray.  He had his x-ray done at [facility] and decided to go to ED for further evaluation about this chest discomfort on breathing issue.  Patient reports intermittent cough unchanged from baseline.  Denies fever or chills.  Denies unintentional weight loss or night sweats.  Admits smoking about 1 to 2 pack a day since he was a teenager.  Denies personal or family history of lung cancer.  In ED, slightly tachycardic but resolved.  Slightly hypertensive.  CBC with mild leukocytosis.  Serial troponin negative.  CXR concerning for LUL mass.  CT angio chest ordered and confirmed LUL mass concerning for bronchogenic carcinoma with intrathoracic LAD, sternal lesion and 8 mm RUL mass.  PCCM consulted and patient was transferred to [facility]   ROS All review of system negative except for pertinent positives and negatives as history of present illness above.  PMH Past Medical History:  Diagnosis Date   Bronchitis    CAD (coronary artery disease)    a. cath 11/26/2014 EF 45%, moderate to severe inferior hypokinesis, 90% small D1, 100% mid to distal RCA with minimal collateral, 40% mid to distal LCx.  Medical therapy as RCA occlusion appears to be completed event; DES to mid circumflex October 2019   Essential hypertension    Hyperlipidemia    Ischemic cardiomyopathy    NSTEMI (non-ST elevated myocardial infarction) (HCC)    11/26/14, 01/31/18   PSH Past Surgical History:  Procedure Laterality Date   CARDIAC CATHETERIZATION N/A 11/26/2014   Procedure: Left Heart Cath and Coronary Angiography;  Surgeon: Peter M Swaziland, MD;  Location: [facility] INVASIVE CV LAB;  Service: Cardiovascular;  Laterality: N/A;   COLONOSCOPY     per patient: done in [place], age 29, couple of polyps.   COLONOSCOPY WITH PROPOFOL N/A 10/08/2022   Procedure: COLONOSCOPY WITH PROPOFOL;  Surgeon: Lanelle Bal, DO;  Location: [facility] ENDO SUITE;  Service: Endoscopy;  Laterality: N/A;  11:00 am   CORONARY STENT INTERVENTION N/A 01/31/2018   Procedure: CORONARY STENT INTERVENTION;  Surgeon: Runell Gess, MD;  Location: [facility] INVASIVE CV LAB;  Service: Cardiovascular;  Laterality: N/A;   LEFT HEART CATH AND CORONARY ANGIOGRAPHY N/A 01/31/2018   Procedure: LEFT HEART CATH AND CORONARY ANGIOGRAPHY;  Surgeon: Runell Gess, MD;  Location: [facility] INVASIVE CV LAB;  Service: Cardiovascular;  Laterality: N/A;   NECK SURGERY  2005   Fam HX Family History  Problem Relation Age of Onset   Diabetes Mother    Heart attack Father    Cancer Sister    Hypertension Brother    Colon cancer Neg Hx    Prostate cancer Neg  Hx    Lung cancer Neg Hx     Social Hx  reports that he has been smoking cigarettes. He has a 42.00 pack-year smoking history. He has never used smokeless tobacco. He reports current alcohol use. He reports current drug use. Drug: Marijuana.  Allergy No Known Allergies Home Meds Prior to Admission medications   Medication Sig Start Date End Date Taking? Authorizing Provider  aspirin EC 81 MG tablet Take 1 tablet (81 mg total) by mouth daily. Swallow whole. 06/27/21   Furth, Cadence H, PA-C  atorvastatin (LIPITOR) 80 MG  tablet Take 1 tablet (80 mg total) by mouth daily. TAKE 1 TABLET BY MOUTH EVERY DAY AT 6 PM 08/25/22 02/21/23  Billie Lade, MD  dapagliflozin propanediol (FARXIGA) 10 MG TABS tablet Take 1 tablet (10 mg total) by mouth daily before breakfast. 08/25/22   Billie Lade, MD  ezetimibe (ZETIA) 10 MG tablet Take 1 tablet (10 mg total) by mouth daily. 12/03/21   Paseda, Baird Kay, FNP  lisinopril (ZESTRIL) 10 MG tablet Take 1 tablet (10 mg total) by mouth daily. 10/20/21 11/25/22  [name], MD  Menthol, Topical Analgesic, (BIOFREEZE EX) Apply 1 Application topically daily as needed (pain).    [provider]  metoprolol succinate (TOPROL-XL) 25 MG 24 hr tablet Take 1 tablet by mouth once daily 09/30/22   Billie Lade, MD  naproxen sodium (ALEVE) 220 MG tablet Take 440 mg by mouth 2 (two) times daily as needed (pain).    [provider]  nitroGLYCERIN (NITROSTAT) 0.4 MG SL tablet Place 1 tablet (0.4 mg total) under the tongue every 5 (five) minutes as needed. 02/02/18   Arty Baumgartner, NP  sildenafil (VIAGRA) 50 MG tablet TAKE 1 TABLET BY MOUTH ONCE DAILY AS NEEDED FOR ERECTILE DYSFUNCTION Patient taking differently: Take 25 mg by mouth daily as needed for erectile dysfunction. 09/30/22   Billie Lade, MD    Physical Exam: Vitals:   12/01/22 0457 12/01/22 0720 12/01/22 0810 12/01/22 1256  BP: 131/77  117/73 128/73  Pulse: 97  93 97  Resp: 16   16  Temp: 98.2 F (36.8 C)  97.9 F (36.6 C) 97.6 F (36.4 C)  TempSrc: Oral  Oral   SpO2: 95% 100% 100% 94%  Weight:      Height:        GENERAL: No acute distress.  Appears well.  HEENT: MMM.  Vision and hearing grossly intact.  NECK: Supple.  No apparent JVD.  Left supraclavicular fullness. RESP:  No IWOB. Good air movement bilaterally. CVS:  RRR. Heart sounds normal.  ABD/GI/GU: Bowel sounds present. Soft. Non tender.  MSK/EXT:  Moves extremities. No apparent deformity or edema.  SKIN: no apparent skin  lesion or wound NEURO: Awake, alert and oriented appropriately.  No gross deficit.  PSYCH: Calm. Normal affect.    Personally Reviewed Radiological Exams See HPI   Personally Reviewed Labs: CBC: Recent Labs  Lab 11/30/22 1536 12/01/22 0901  WBC 14.1* 11.5*  HGB 12.5* 12.8*  HCT 38.9* 38.7*  MCV 89.2 86.2  PLT 389 421*   Basic Metabolic Panel: Recent Labs  Lab 11/30/22 1536 12/01/22 0901  NA 135 133*  K 4.7 4.4  CL 100 97*  CO2 26 24  GLUCOSE 97 130*  BUN 15 13  CREATININE 0.82 0.66  CALCIUM 9.9 10.3  MG  --  2.2  PHOS  --  4.4   GFR: Estimated Creatinine Clearance: 98 mL/min (by  C-G formula based on SCr of 0.66 mg/dL). Liver Function Tests: Recent Labs  Lab 12/01/22 0901  ALBUMIN 3.4*   No results for input(s): "LIPASE", "AMYLASE" in the last 168 hours. No results for input(s): "AMMONIA" in the last 168 hours. Coagulation Profile: Recent Labs  Lab 11/30/22 1536  INR 1.2   Cardiac Enzymes: No results for input(s): "CKTOTAL", "CKMB", "CKMBINDEX", "TROPONINI" in the last 168 hours. BNP (last 3 results) No results for input(s): "PROBNP" in the last 8760 hours. HbA1C: No results for input(s): "HGBA1C" in the last 72 hours. CBG: No results for input(s): "GLUCAP" in the last 168 hours. Lipid Profile: No results for input(s): "CHOL", "HDL", "LDLCALC", "TRIG", "CHOLHDL", "LDLDIRECT" in the last 72 hours. Thyroid Function Tests: No results for input(s): "TSH", "T4TOTAL", "FREET4", "T3FREE", "THYROIDAB" in the last 72 hours. Anemia Panel: No results for input(s): "VITAMINB12", "FOLATE", "FERRITIN", "TIBC", "IRON", "RETICCTPCT" in the last 72 hours. Urine analysis:    Component Value Date/Time   COLORURINE YELLOW 12/07/2014 1339   APPEARANCEUR CLEAR 12/07/2014 1339   LABSPEC 1.020 12/07/2014 1339   PHURINE 7.5 12/07/2014 1339   GLUCOSEU NEGATIVE 12/07/2014 1339   HGBUR NEGATIVE 12/07/2014 1339   BILIRUBINUR NEGATIVE 12/07/2014 1339   KETONESUR NEGATIVE  12/07/2014 1339   PROTEINUR 30 (A) 12/07/2014 1339   UROBILINOGEN 1.0 12/07/2014 1339   NITRITE NEGATIVE 12/07/2014 1339   LEUKOCYTESUR NEGATIVE 12/07/2014 1339     Assessment and plan:  Principal Problem:   Lung mass Active Problems:   Essential hypertension   Hyperlipidemia   CAD (coronary artery disease), native coronary artery   Tobacco use   Chronic systolic CHF (congestive heart failure) (HCC)   LUL mass/intrathoracic LAD/RUL nodule/sternal lesion: 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  Smokes about 1-2 PPD per day since he was a teenager.  No constitutional symptoms. -Pulmonology consulted-plan for bronchoscopy   Chronic systolic CHF with recovered EF: Appears euvolemic on exam. -Resume home meds  History of CAD: Stable.  Chest discomfort likely from #1. -Continue home meds after med rec  Essential hypertension: Normotensive -Continue home meds  Tobacco use disorder: Smoking about 1 to 2 pack a day since he was a teenager. -Encourage smoking cessation -Not interested in nicotine patch  Mild leukocytosis: Likely related to #1.  Resolving.   DVT prophylaxis: SCD  Code Status: Full code Family Communication: Updated patient's brothers at bedside  Consults called: Pulmonology Admission status: Observation   Almon Hercules MD [facility]  If 7PM-7AM, please contact night-coverage [email or web address]  12/01/2022, 1:06 PM

## 2022-12-01 NOTE — Transfer of Care (Signed)
Immediate Anesthesia Transfer of Care Note  Patient: [name]  Procedure(s) Performed: VIDEO BRONCHOSCOPY WITH FLUORO BRONCHIAL BIOPSIES BRONCHIAL WASHINGS BRONCHIAL BRUSHINGS HEMOSTASIS CONTROL  Patient Location: PACU  Anesthesia Type:General  Level of Consciousness: awake, alert , and oriented  Airway & Oxygen Therapy: Patient connected to face mask oxygen  Post-op Assessment: Report given to RN, Post -op Vital signs reviewed and stable, and Patient moving all extremities X 4  Post vital signs: Reviewed and stable  Last Vitals:  Vitals Value Taken Time  BP 147/80   Temp    Pulse 112   Resp    SpO2 95     Last Pain:  Vitals:   12/01/22 1256  TempSrc:   PainSc: 0-No pain         Complications: There were no known notable events for this encounter.

## 2022-12-01 NOTE — Anesthesia Preprocedure Evaluation (Addendum)
Anesthesia Evaluation  Patient identified by MRN, date of birth, ID band Patient awake    Reviewed: Allergy & Precautions, NPO status , Patient's Chart, lab work & pertinent test results, reviewed documented beta blocker date and time   Airway Mallampati: I  TM Distance: >3 FB Neck ROM: Full    Dental  (+) Missing, Dental Advisory Given   Pulmonary Current Smoker and Patient abstained from smoking. Lung mass    + wheezing      Cardiovascular hypertension, Pt. on medications and Pt. on home beta blockers + CAD, + Past MI, + Cardiac Stents and +CHF   Rhythm:Regular Rate:Normal  Stent x 1 RCA   Neuro/Psych negative neurological ROS  negative psych ROS   GI/Hepatic negative GI ROS, Neg liver ROS,,,  Endo/Other  Hyperlipidemia  Renal/GU negative Renal ROS  negative genitourinary   Musculoskeletal  (+) Arthritis , Osteoarthritis,    Abdominal Normal abdominal exam  (+)   Peds  Hematology negative hematology ROS (+)   Anesthesia Other Findings   Reproductive/Obstetrics ED                              Anesthesia Physical Anesthesia Plan  ASA: 3  Anesthesia Plan: General   Post-op Pain Management:    Induction: Intravenous  PONV Risk Score and Plan: 1 and Propofol infusion, Treatment may vary due to age or medical condition, Ondansetron and TIVA  Airway Management Planned: Mask and Oral ETT  Additional Equipment: None  Intra-op Plan:   Post-operative Plan: Extubation in OR  Informed Consent: I have reviewed the patients History and Physical, chart, labs and discussed the procedure including the risks, benefits and alternatives for the proposed anesthesia with the patient or authorized representative who has indicated his/her understanding and acceptance.     Dental advisory given  Plan Discussed with: CRNA and Anesthesiologist  Anesthesia Plan Comments:          Anesthesia Quick Evaluation

## 2022-12-01 NOTE — Op Note (Signed)
Bronchoscopy Procedure Note  Rodney Mahoney  [id number]  [birth year]  Date:12/01/22  Time:2:56 PM   Provider Performing:Binnie Droessler B Rayola Everhart   Procedure(s):  Flexible Bronchoscopy (40981), Flexible bronchoscopy with brushing (19147), Flexible bronchoscopy with bronchial alveolar lavage (82956), and Transbronchial lung biopsy, single lobe (21308)  Indication(s) LUL Mass  Consent Risks of the procedure as well as the alternatives and risks of each were explained to the patient and/or caregiver.  Consent for the procedure was obtained and is signed in the bedside chart  Anesthesia General   Time Out Verified patient identification, verified procedure, site/side was marked, verified correct patient position, special equipment/implants available, medications/allergies/relevant history reviewed, required imaging and test results available.   Sterile Technique Usual hand hygiene, masks, gowns, and gloves were used   Procedure Description Bronchoscope advanced through endotracheal tube and into airway.  Airways were examined down to subsegmental level with findings noted below.   Following diagnostic evaluation, BAL(s) performed in LUL with normal saline and return of 25mL fluid, Brushing(s) performed in LUL, and Transbronchial biopsy(s) performed under fluoroscopic guidance in LUL  Findings:  - narrowing of LUL apical and posterior segment entrances - Otherwise, normal appearing bronchial trees   Complications/Tolerance None; patient tolerated the procedure well. Chest X-ray is needed post procedure.   EBL Minimal   Specimen(s) LUL transbronchial forcep biopsies for tissue culture and surgical pathology LUL brushings for cytology LUL BAL for micro

## 2022-12-01 NOTE — Consult Note (Signed)
NAME:  Rodney Mahoney, MRN:  [id number], DOB:  [birth year], LOS: 0 ADMISSION DATE:  11/30/2022, CONSULTATION DATE: 03/2023 REFERRING MD: [facility], CHIEF COMPLAINT: Left lung mass  History of Present Illness:  62 year old retired male lifetime smoking history he has known coronary artery disease status post MI x 2 with a stent 5 years ago.  He notes neck pain for over a year.  White sputum production with chest pain on coughing for 1 year.  CT scan reveals left lower lobe cavitary lesion consistent with bronchogenic cancer.  Pulmonary critical care asked to perform fiberoptic bronchoscopy and EBUS.  Pertinent  Medical History   Past Medical History:  Diagnosis Date   Bronchitis    CAD (coronary artery disease)    a. cath 11/26/2014 EF 45%, moderate to severe inferior hypokinesis, 90% small D1, 100% mid to distal RCA with minimal collateral, 40% mid to distal LCx. Medical therapy as RCA occlusion appears to be completed event; DES to mid circumflex October 2019   Essential hypertension    Hyperlipidemia    Ischemic cardiomyopathy    NSTEMI (non-ST elevated myocardial infarction) (HCC)    11/26/14, 01/31/18     Significant Hospital Events: Including procedures, antibiotic start and stop dates in addition to other pertinent events   Plan for EBUS 12/01/2022  Interim History / Subjective:  Reports having white sputum for over a year  Objective   Blood pressure 117/73, pulse 93, temperature 97.9 F (36.6 C), temperature source Oral, resp. rate 16, height 5\' 6"  (1.676 m), weight 85.3 kg, SpO2 100 %.       No intake or output data in the 24 hours ending 12/01/22 0932 Filed Weights   11/30/22 1453  Weight: 85.3 kg    Examination: General: Well-nourished well-developed male no acute distress HENT: No JVD or lymphadenopathy is appreciated old scars are noted Lungs: Decreased breath sounds on the left Cardiovascular: Heart sounds are regular regular rate and rhythm Abdomen: Soft  nontender positive bowel sounds Extremities: Without edema warm Neuro: Grossly intact without focal defect GU: Voids  Resolved Hospital Problem list     Assessment & Plan:  New finding of left upper lobe mass in a patient who is a smoker pulmonary critical care asked to evaluate for possible fiberoptic bronchoscopy. Plan for EBUS 2:30 PM N.p.o. Procedure explained to patient and wife MD to follow-up  Coronary artery disease status post MI x 2 last was 5 years ago with stent placement Should not be a counter indication to anesthesia  History of cervical laminectomy Stable at this point  History of GI bleed secondary to NSAIDs Stable at this point  Best Practice (right click and "Reselect all SmartList Selections" daily)   Diet/type: NPO DVT prophylaxis: not indicated GI prophylaxis: PPI Lines: N/A Foley:  N/A Code Status:  full code Last date of multidisciplinary goals of care discussion [t]  Labs   CBC: Recent Labs  Lab 11/30/22 1536 12/01/22 0901  WBC 14.1* 11.5*  HGB 12.5* 12.8*  HCT 38.9* 38.7*  MCV 89.2 86.2  PLT 389 421*    Basic Metabolic Panel: Recent Labs  Lab 11/30/22 1536  NA 135  K 4.7  CL 100  CO2 26  GLUCOSE 97  BUN 15  CREATININE 0.82  CALCIUM 9.9   GFR: Estimated Creatinine Clearance: 95.7 mL/min (by C-G formula based on SCr of 0.82 mg/dL). Recent Labs  Lab 11/30/22 1536 12/01/22 0901  WBC 14.1* 11.5*    Liver Function Tests: No results for  input(s): "AST", "ALT", "ALKPHOS", "BILITOT", "PROT", "ALBUMIN" in the last 168 hours. No results for input(s): "LIPASE", "AMYLASE" in the last 168 hours. No results for input(s): "AMMONIA" in the last 168 hours.  ABG No results found for: "PHART", "PCO2ART", "PO2ART", "HCO3", "TCO2", "ACIDBASEDEF", "O2SAT"   Coagulation Profile: Recent Labs  Lab 11/30/22 1536  INR 1.2    Cardiac Enzymes: No results for input(s): "CKTOTAL", "CKMB", "CKMBINDEX", "TROPONINI" in the last 168  hours.  HbA1C: Hgb A1c MFr Bld  Date/Time Value Ref Range Status  10/28/2022 03:01 PM 6.3 (H) 4.8 - 5.6 % Final    Comment:             Prediabetes: 5.7 - 6.4          Diabetes: >6.4          Glycemic control for adults with diabetes: <7.0   02/25/2022 09:54 AM 6.0 (H) 4.8 - 5.6 % Final    Comment:             Prediabetes: 5.7 - 6.4          Diabetes: >6.4          Glycemic control for adults with diabetes: <7.0     CBG: No results for input(s): "GLUCAP" in the last 168 hours.  Review of Systems:   10 point review of system taken, please see HPI for positives and negatives.   Past Medical History:  He,  has a past medical history of Bronchitis, CAD (coronary artery disease), Essential hypertension, Hyperlipidemia, Ischemic cardiomyopathy, and NSTEMI (non-ST elevated myocardial infarction) (HCC).   Surgical History:   Past Surgical History:  Procedure Laterality Date   CARDIAC CATHETERIZATION N/A 11/26/2014   Procedure: Left Heart Cath and Coronary Angiography;  Surgeon: Peter M Swaziland, MD;  Location: [facility] INVASIVE CV LAB;  Service: Cardiovascular;  Laterality: N/A;   COLONOSCOPY     per patient: done in [place], age 61, couple of polyps.   COLONOSCOPY WITH PROPOFOL N/A 10/08/2022   Procedure: COLONOSCOPY WITH PROPOFOL;  Surgeon: Lanelle Bal, DO;  Location: [facility] ENDO SUITE;  Service: Endoscopy;  Laterality: N/A;  11:00 am   CORONARY STENT INTERVENTION N/A 01/31/2018   Procedure: CORONARY STENT INTERVENTION;  Surgeon: Runell Gess, MD;  Location: [facility] INVASIVE CV LAB;  Service: Cardiovascular;  Laterality: N/A;   LEFT HEART CATH AND CORONARY ANGIOGRAPHY N/A 01/31/2018   Procedure: LEFT HEART CATH AND CORONARY ANGIOGRAPHY;  Surgeon: Runell Gess, MD;  Location: [facility] INVASIVE CV LAB;  Service: Cardiovascular;  Laterality: N/A;   NECK SURGERY  2005     Social History:   reports that he has been smoking cigarettes. He has a 42.00 pack-year smoking history. He has never used  smokeless tobacco. He reports current alcohol use. He reports current drug use. Drug: Marijuana.   Family History:  His family history includes Cancer in his sister; Diabetes in his mother; Heart attack in his father; Hypertension in his brother. There is no history of Colon cancer, Prostate cancer, or Lung cancer.   Allergies No Known Allergies   Home Medications  Prior to Admission medications   Medication Sig Start Date End Date Taking? Authorizing Provider  aspirin EC 81 MG tablet Take 1 tablet (81 mg total) by mouth daily. Swallow whole. 06/27/21   Furth, Cadence H, PA-C  atorvastatin (LIPITOR) 80 MG tablet Take 1 tablet (80 mg total) by mouth daily. TAKE 1 TABLET BY MOUTH EVERY DAY AT 6 PM 08/25/22 02/21/23  Dixon,  Lucina Mellow, MD  dapagliflozin propanediol (FARXIGA) 10 MG TABS tablet Take 1 tablet (10 mg total) by mouth daily before breakfast. 08/25/22   Billie Lade, MD  ezetimibe (ZETIA) 10 MG tablet Take 1 tablet (10 mg total) by mouth daily. 12/03/21   Paseda, Baird Kay, FNP  lisinopril (ZESTRIL) 10 MG tablet Take 1 tablet (10 mg total) by mouth daily. 10/20/21 11/25/22  [name], MD  Menthol, Topical Analgesic, (BIOFREEZE EX) Apply 1 Application topically daily as needed (pain).    [provider]  metoprolol succinate (TOPROL-XL) 25 MG 24 hr tablet Take 1 tablet by mouth once daily 09/30/22   Billie Lade, MD  naproxen sodium (ALEVE) 220 MG tablet Take 440 mg by mouth 2 (two) times daily as needed (pain).    [provider]  nitroGLYCERIN (NITROSTAT) 0.4 MG SL tablet Place 1 tablet (0.4 mg total) under the tongue every 5 (five) minutes as needed. 02/02/18   Arty Baumgartner, NP  sildenafil (VIAGRA) 50 MG tablet TAKE 1 TABLET BY MOUTH ONCE DAILY AS NEEDED FOR ERECTILE DYSFUNCTION Patient taking differently: Take 25 mg by mouth daily as needed for erectile dysfunction. 09/30/22   Billie Lade, MD     Critical care time: Elizebeth Brooking Roxanne Orner ACNP Acute  Care Nurse Practitioner [facility]ack Pulmonary/Critical Care Please consult Amion 12/01/2022, 9:32 AM

## 2022-12-01 NOTE — Anesthesia Procedure Notes (Signed)
Procedure Name: Intubation Date/Time: 12/01/2022 2:08 PM  Performed by: Sharyn Dross, CRNAPre-anesthesia Checklist: Patient identified, Emergency Drugs available, Suction available and Patient being monitored Patient Re-evaluated:Patient Re-evaluated prior to induction Oxygen Delivery Method: Circle system utilized Preoxygenation: Pre-oxygenation with 100% oxygen Induction Type: IV induction Ventilation: Mask ventilation without difficulty Laryngoscope Size: Mac and 4 Grade View: Grade I Tube type: Oral Tube size: 8.5 mm Number of attempts: 1 Airway Equipment and Method: Stylet and Oral airway Placement Confirmation: ETT inserted through vocal cords under direct vision, positive ETCO2 and breath sounds checked- equal and bilateral Secured at: 23 cm Tube secured with: Tape Dental Injury: Teeth and Oropharynx as per pre-operative assessment

## 2022-12-02 ENCOUNTER — Encounter: Payer: Self-pay | Admitting: Internal Medicine

## 2022-12-02 ENCOUNTER — Telehealth: Payer: Self-pay | Admitting: Pulmonary Disease

## 2022-12-02 ENCOUNTER — Other Ambulatory Visit: Payer: Self-pay | Admitting: Internal Medicine

## 2022-12-02 ENCOUNTER — Other Ambulatory Visit: Payer: Self-pay | Admitting: Cardiology

## 2022-12-02 DIAGNOSIS — Z955 Presence of coronary angioplasty implant and graft: Secondary | ICD-10-CM | POA: Diagnosis not present

## 2022-12-02 DIAGNOSIS — Z720 Tobacco use: Secondary | ICD-10-CM | POA: Diagnosis not present

## 2022-12-02 DIAGNOSIS — E785 Hyperlipidemia, unspecified: Secondary | ICD-10-CM | POA: Diagnosis not present

## 2022-12-02 DIAGNOSIS — F17210 Nicotine dependence, cigarettes, uncomplicated: Secondary | ICD-10-CM | POA: Diagnosis not present

## 2022-12-02 DIAGNOSIS — R079 Chest pain, unspecified: Secondary | ICD-10-CM | POA: Diagnosis not present

## 2022-12-02 DIAGNOSIS — I5022 Chronic systolic (congestive) heart failure: Secondary | ICD-10-CM | POA: Diagnosis not present

## 2022-12-02 DIAGNOSIS — Z1152 Encounter for screening for COVID-19: Secondary | ICD-10-CM | POA: Diagnosis not present

## 2022-12-02 DIAGNOSIS — R918 Other nonspecific abnormal finding of lung field: Secondary | ICD-10-CM | POA: Diagnosis not present

## 2022-12-02 DIAGNOSIS — I2510 Atherosclerotic heart disease of native coronary artery without angina pectoris: Secondary | ICD-10-CM | POA: Diagnosis not present

## 2022-12-02 DIAGNOSIS — Z79899 Other long term (current) drug therapy: Secondary | ICD-10-CM | POA: Diagnosis not present

## 2022-12-02 DIAGNOSIS — C3412 Malignant neoplasm of upper lobe, left bronchus or lung: Secondary | ICD-10-CM | POA: Diagnosis not present

## 2022-12-02 DIAGNOSIS — I110 Hypertensive heart disease with heart failure: Secondary | ICD-10-CM | POA: Diagnosis not present

## 2022-12-02 DIAGNOSIS — Z7982 Long term (current) use of aspirin: Secondary | ICD-10-CM | POA: Diagnosis not present

## 2022-12-02 DIAGNOSIS — I10 Essential (primary) hypertension: Secondary | ICD-10-CM | POA: Diagnosis not present

## 2022-12-02 LAB — ACID FAST SMEAR (AFB, MYCOBACTERIA)
Acid Fast Smear: NEGATIVE
Acid Fast Smear: NEGATIVE

## 2022-12-02 LAB — SARS CORONAVIRUS 2 (TAT 6-24 HRS): SARS Coronavirus 2: NEGATIVE

## 2022-12-02 LAB — SURGICAL PATHOLOGY

## 2022-12-02 LAB — CYTOLOGY - NON PAP

## 2022-12-02 MED ORDER — ALBUTEROL SULFATE HFA 108 (90 BASE) MCG/ACT IN AERS: 2.0000 | INHALATION_SPRAY | Freq: Four times a day (QID) | RESPIRATORY_TRACT | 2 refills | Status: DC | PRN

## 2022-12-02 NOTE — Assessment & Plan Note (Signed)
Lipid panel updated last month.  Total cholesterol and LDL have both significantly improved since resuming atorvastatin 80 mg daily.  No additional medication changes are indicated today.

## 2022-12-02 NOTE — Anesthesia Postprocedure Evaluation (Signed)
Anesthesia Post Note  Patient: HOBBS PAROLA  Procedure(s) Performed: VIDEO BRONCHOSCOPY WITH FLUORO BRONCHIAL BIOPSIES BRONCHIAL WASHINGS BRONCHIAL BRUSHINGS HEMOSTASIS CONTROL     Patient location during evaluation: PACU Anesthesia Type: General Level of consciousness: awake and alert Pain management: pain level controlled Vital Signs Assessment: post-procedure vital signs reviewed and stable Respiratory status: spontaneous breathing, nonlabored ventilation, respiratory function stable and patient connected to nasal cannula oxygen Cardiovascular status: blood pressure returned to baseline and stable Postop Assessment: no apparent nausea or vomiting Anesthetic complications: no   There were no known notable events for this encounter.  Last Vitals:  Vitals:   12/02/22 0751 12/02/22 0807  BP: 117/74   Pulse: 83 90  Resp: 18 18  Temp: 36.9 C   SpO2: 98% 98%    Last Pain:  Vitals:   12/01/22 2150  TempSrc:   PainSc: 0-No pain                 Earl Lites P Josue Kass

## 2022-12-02 NOTE — Plan of Care (Signed)

## 2022-12-02 NOTE — Assessment & Plan Note (Signed)
He describes an intermittent, pressure-like sensation in the right side of his chest that seems associated with discomfort in his neck.  Denies chest pain currently.  MRI of the cervical spine is pending, however given his significant cardiac history I have recommended that he contact his cardiologist to schedule follow-up.

## 2022-12-02 NOTE — Discharge Summary (Signed)
Physician Discharge Summary  Rodney Mahoney ZOX:[id number] DOB: [birth year] DOA: 11/30/2022  PCP: Billie Lade, MD  Admit date: 11/30/2022 Discharge date: 12/02/2022 Admitted From: Home Disposition: Home Recommendations for Outpatient Follow-up:  Follow up with PCP in in 1 week Pulmonology to arrange outpatient follow-up after pathology result Check CBC and CMP at follow-up Please follow up on the following pending results: Biopsy from bronchoscopy  Home Health: Not indicated Equipment/Devices: Not indicated  Discharge Condition: Stable CODE STATUS: Full code  Follow-up Information     Billie Lade, MD. Schedule an appointment as soon as possible for a visit in 1 week(s).   Specialty: Internal Medicine Contact information: 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     Patient is not a great historian.  Reports shortness of breath for about a week.  Also report chest wall pain.  He was seen by his PCP about a month ago for neck pain for about 10 months with history of cervical spinal surgery.  He had ambulatory referral to neurosurgery as well as x-ray.  He had his x-ray done at [facility] and decided to go to ED for further evaluation about this chest discomfort on breathing issue.  Patient reports intermittent cough unchanged from baseline.  Denies fever or chills.  Denies unintentional weight loss or night sweats.  Admits smoking about 1 to 2 pack a day since he was a teenager.  Denies personal or family history of lung cancer.   In ED, slightly tachycardic but resolved.  Slightly hypertensive.  CBC  with mild leukocytosis.  Serial troponin negative.  CXR concerning for LUL mass.  CT angio chest ordered and confirmed LUL mass concerning for bronchogenic carcinoma with intrathoracic LAD, sternal lesion and 8 mm RUL mass.  PCCM consulted and patient was transferred to [facility].  Patient underwent bronchoscopy with biopsy on 6/18.  Remained stable after bronchoscopy and cleared for discharge on 6/19.  Biopsy results including culture, AFB/AFC, fungal culture and cytology pending at time of discharge.  Pulmonology to follow-up on biopsy results and arrange outpatient follow-up as appropriate    See individual problem list below for more.   Problems addressed during this hospitalization Principal Problem:   Lung mass Active Problems:   Essential hypertension   Hyperlipidemia   CAD (coronary artery disease), native coronary artery   Tobacco use   Chronic systolic CHF (congestive heart failure) (HCC)              Time spent 35 minutes  Vital signs Vitals:   12/01/22 2034 12/02/22 0436 12/02/22 0751 12/02/22 0807  BP: 110/68 109/68 117/74   Pulse: 92 77 83 90  Temp: 97.8 F (36.6 C) 97.6 F (36.4 C) 98.4 F (36.9 C)   Resp: 18 16 18 18   Height:      Weight:      SpO2: 96% 93% 98% 98%  TempSrc:      BMI (Calculated):         Discharge exam  GENERAL: No apparent distress.  Nontoxic. HEENT: MMM.  Vision and hearing grossly intact.  NECK: Supple.  No apparent JVD.  RESP:  No IWOB.  Some rhonchi bilaterally. CVS:  RRR. Heart sounds normal.  ABD/GI/GU: BS+. Abd soft, NTND.  MSK/EXT:  Moves extremities. No apparent deformity. No edema.  SKIN: no apparent skin lesion or wound NEURO: Awake and alert. Oriented appropriately.  No apparent focal neuro deficit. PSYCH: Calm. Normal affect.   Discharge Instructions Discharge Instructions     Diet - low sodium heart healthy   Complete by: As directed    Discharge instructions   Complete by: As directed    It has been a  pleasure taking care of you!  You were hospitalized due to trouble breathing.  Your CAT scan showed lung mass concerning for cancer.  You underwent bronchoscopy to evaluate the lung mass.  Someone will get in touch with you about the results from bronchoscopy.  We strongly recommend you quit smoking cigarettes.  Follow-up with your primary care doctor in 1 to 2 weeks or sooner if needed.  Pulmonology (lung doctors) will arrange outpatient follow-up.   It is important that you quit smoking cigarettes.  You may use nicotine patch to help you quit smoking.  Nicotine patch is available over-the-counter.  You may also discuss other options to help you quit smoking with your primary care doctor. You can also talk to professional counselors at 1-800-QUIT-NOW 334-144-9904) for free smoking cessation counseling.     Take care,   Increase activity slowly   Complete by: As directed       Allergies as of 12/02/2022   No Known Allergies      Medication List     STOP taking these medications    naproxen sodium 220 MG tablet Commonly known as: ALEVE   sildenafil 50 MG tablet Commonly known as: VIAGRA       TAKE these medications    acetaminophen 500 MG tablet Commonly known as: TYLENOL Take 500 mg by mouth every 6 (six) hours as needed for mild pain.   albuterol 108 (90 Base) MCG/ACT inhaler Commonly known as: VENTOLIN HFA Inhale 2 puffs into the lungs every 6 (six) hours as needed for wheezing or shortness of breath.   aspirin EC 81 MG tablet Take 1 tablet (81 mg total) by mouth daily. Swallow whole.   atorvastatin 80 MG tablet Commonly known as: LIPITOR Take 1 tablet (80 mg total) by mouth daily. TAKE 1 TABLET BY MOUTH EVERY DAY AT 6 PM What changed:  when to take this additional instructions   BIOFREEZE EX Apply 1 Application topically daily as needed (pain).   dapagliflozin propanediol 10 MG Tabs tablet Commonly known as: Farxiga Take 1 tablet (10 mg total) by mouth  daily before breakfast.   ezetimibe 10 MG tablet Commonly known as: Zetia Take 1 tablet (10 mg total) by mouth daily.   nitroGLYCERIN 0.4 MG SL tablet Commonly known as: Nitrostat Place 1 tablet (0.4 mg total) under the tongue every 5 (five) minutes as needed.        Consultations: Pulmonology  Procedures/Studies: Bronchoscopy with biopsy   DG CHEST PORT 1 VIEW  Result Date: 12/01/2022 CLINICAL DATA:  Status post bronchoscopy with biopsy. EXAM: PORTABLE CHEST 1 VIEW COMPARISON:  November 30, 2022. FINDINGS: Stable cardiomediastinal silhouette. Left upper lobe lung mass is again noted. No definite pneumothorax or pleural effusion is noted status post bronchoscopy. Right lung is clear. Bony thorax is unremarkable. IMPRESSION: No definite pneumothorax or pleural effusion status post bronchoscopy and  biopsy of left upper lobe lung mass. Electronically Signed   By: Lupita Raider M.D.   On: 12/01/2022 16:00   DG C-ARM BRONCHOSCOPY  Result Date: 12/01/2022 C-ARM BRONCHOSCOPY: Fluoroscopy was utilized by the requesting physician.  No radiographic interpretation.   CT Angio Chest PE W and/or Wo Contrast  Result Date: 11/30/2022 CLINICAL DATA:  High probability for PE with left-sided chest pain. EXAM: CT ANGIOGRAPHY CHEST WITH CONTRAST TECHNIQUE: Multidetector CT imaging of the chest was performed using the standard protocol during bolus administration of intravenous contrast. Multiplanar CT image reconstructions and MIPs were obtained to evaluate the vascular anatomy. RADIATION DOSE REDUCTION: This exam was performed according to the departmental dose-optimization program which includes automated exposure control, adjustment of the mA and/or kV according to patient size and/or use of iterative reconstruction technique. CONTRAST:  75mL OMNIPAQUE IOHEXOL 350 MG/ML SOLN COMPARISON:  CT PE protocol 11/26/2014 FINDINGS: Cardiovascular: Satisfactory opacification of the pulmonary arteries to the  segmental level. No evidence of pulmonary embolism. Normal heart size. No pericardial effusion. There are atherosclerotic calcifications of the aorta. Mediastinum/Nodes: There is bulky prevascular lymphadenopathy measuring 5.9 x 3.1 cm. There are multiple enlarged left hilar lymph nodes the largest measuring 1.9 by 2.3 cm. There are enlarged lymph nodes in the lower left neck with the largest lymph node measuring 2.2 by 4.3 cm lateral to the thyroid gland. Confluence soft tissue is seen abutting in between the left common carotid and left subclavian artery measuring 2.0 x 4.0 cm image 4/19. Visualized thyroid gland and esophagus are within normal limits. Lungs/Pleura: There is a left upper lobe mass measuring 8.0 x 6.0 x 7.1 cm. This abuts the mediastinum and right hilum. Central area of low attenuation and air likely related to necrosis. There is a right upper lobe nodule measuring 8 mm image 6/60. There is no pleural effusion or pneumothorax. There is cutoff of the left upper lobe bronchus. Upper Abdomen: No acute abnormality. Musculoskeletal: Lytic lesion with soft tissue component in the region of the right superior sternum is present with soft tissue component extending into the anterior mediastinum. Anterior mediastinal soft tissue component measures 6.2 x 2.2 x 3.8 cm. Cervical spinal fusion plate is present. Review of the MIP images confirms the above findings. IMPRESSION: 1. No evidence for pulmonary embolism. 2. Left upper lobe mass with central necrosis worrisome for primary bronchogenic carcinoma. 3. Bulky mediastinal, left hilar and left lower neck lymphadenopathy worrisome for metastatic disease. 4. Lytic lesion in the right superior sternum with soft tissue component extending into the anterior mediastinum worrisome for metastatic disease. 5. 8 mm right upper lobe pulmonary nodule worrisome for metastatic disease. Aortic Atherosclerosis (ICD10-I70.0). Electronically Signed   By: Darliss Cheney M.D.    On: 11/30/2022 22:47   DG Cervical Spine 2 or 3 views  Result Date: 11/30/2022 CLINICAL DATA:  Neck pain without recent injury. EXAM: CERVICAL SPINE - 2-3 VIEW COMPARISON:  December 08, 2003. FINDINGS: Status post surgical anterior fusion of C5-6 and C6-7. No acute fracture or spondylolisthesis is noted. Large anterior osteophyte formation is noted at C4-5. No prevertebral soft tissue swelling is noted. IMPRESSION: Postsurgical and degenerative changes as described above. No acute abnormality seen. Electronically Signed   By: Lupita Raider M.D.   On: 11/30/2022 16:14   DG Chest 2 View  Result Date: 11/30/2022 CLINICAL DATA:  Chest pain. EXAM: CHEST - 2 VIEW COMPARISON:  March 19, 2021. FINDINGS: Stable cardiomediastinal silhouette. Large lobulated left upper lobe mass is  noted concerning for malignancy. Right lung is clear. Bony thorax is unremarkable. IMPRESSION: Left upper lobe density is noted most consistent with left upper lobe mass or malignancy with associated atelectasis. CT scan of the chest with intravenous contrast is recommended for further evaluation. Electronically Signed   By: Lupita Raider M.D.   On: 11/30/2022 16:11       The results of significant diagnostics from this hospitalization (including imaging, microbiology, ancillary and laboratory) are listed below for reference.     Microbiology: Recent Results (from the past 240 hour(s))  Aerobic/Anaerobic Culture w Gram Stain (surgical/deep wound)     Status: None (Preliminary result)   Collection Time: 12/01/22  2:23 PM   Specimen: PATH Respiratory biopsy; Tissue  Result Value Ref Range Status   Specimen Description BIOPSY LEFT UPPER LUNG  Final   Special Requests NONE  Final   Gram Stain NO WBC SEEN NO ORGANISMS SEEN   Final   Culture   Final    NO GROWTH < 24 HOURS Performed at [facility], [address], [place], [place] [address]    Report Status PENDING  Incomplete  SARS CORONAVIRUS 2 (TAT 6-24 HRS)  Anterior Nasal Swab     Status: None   Collection Time: 12/01/22  2:23 PM   Specimen: Anterior Nasal Swab  Result Value Ref Range Status   SARS Coronavirus 2 NEGATIVE NEGATIVE Final    Comment: (NOTE) SARS-CoV-2 target nucleic acids are NOT DETECTED.  The SARS-CoV-2 RNA is generally detectable in upper and lower respiratory specimens during the acute phase of infection. Negative results do not preclude SARS-CoV-2 infection, do not rule out co-infections with other pathogens, and should not be used as the sole basis for treatment or other patient management decisions. Negative results must be combined with clinical observations, patient history, and epidemiological information. The expected result is Negative.  Fact Sheet for Patients: HairSlick.no  Fact Sheet for Healthcare Providers: quierodirigir.com  This test is not yet approved or cleared by the [place] FDA and  has been authorized for detection and/or diagnosis of SARS-CoV-2 by FDA under an Emergency Use Authorization (EUA). This EUA will remain  in effect (meaning this test can be used) for the duration of the COVID-19 declaration under Se ction 564(b)(1) of the Act, 21 U.S.C. section 360bbb-3(b)(1), unless the authorization is terminated or revoked sooner.  Performed at [facility], [address], [place], [place] [address]   Culture, BAL-quantitative w Gram Stain     Status: None (Preliminary result)   Collection Time: 12/01/22  2:33 PM   Specimen: Bronchial Alveolar Lavage; Respiratory  Result Value Ref Range Status   Specimen Description BRONCHIAL ALVEOLAR LAVAGE LEFT UPPER LUNG  Final   Special Requests NONE  Final   Gram Stain   Final    RARE WBC PRESENT, PREDOMINANTLY PMN NO ORGANISMS SEEN    Culture   Final    NO GROWTH < 24 HOURS Performed at [facility], [address], [place], [place] [address]    Report Status PENDING   Incomplete  Anaerobic culture w Gram Stain     Status: None (Preliminary result)   Collection Time: 12/01/22  2:33 PM   Specimen: Bronchoalveolar Lavage  Result Value Ref Range Status   Specimen Description BRONCHIAL ALVEOLAR LAVAGE LEFT UPPER LUNG  Final   Special Requests NONE  Final   Gram Stain   Final    RARE WBC PRESENT, PREDOMINANTLY PMN NO  ORGANISMS SEEN Performed at [facility], [address] [place], [place] [address]    Culture PENDING  Incomplete   Report Status PENDING  Incomplete     Labs:  CBC: Recent Labs  Lab 11/30/22 1536 12/01/22 0901  WBC 14.1* 11.5*  HGB 12.5* 12.8*  HCT 38.9* 38.7*  MCV 89.2 86.2  PLT 389 421*   BMP &GFR Recent Labs  Lab 11/30/22 1536 12/01/22 0901  NA 135 133*  K 4.7 4.4  CL 100 97*  CO2 26 24  GLUCOSE 97 130*  BUN 15 13  CREATININE 0.82 0.66  CALCIUM 9.9 10.3  MG  --  2.2  PHOS  --  4.4   Estimated Creatinine Clearance: 98 mL/min (by C-G formula based on SCr of 0.66 mg/dL). Liver & Pancreas: Recent Labs  Lab 12/01/22 0901  ALBUMIN 3.4*   No results for input(s): "LIPASE", "AMYLASE" in the last 168 hours. No results for input(s): "AMMONIA" in the last 168 hours. Diabetic: No results for input(s): "HGBA1C" in the last 72 hours. No results for input(s): "GLUCAP" in the last 168 hours. Cardiac Enzymes: No results for input(s): "CKTOTAL", "CKMB", "CKMBINDEX", "TROPONINI" in the last 168 hours. No results for input(s): "PROBNP" in the last 8760 hours. Coagulation Profile: Recent Labs  Lab 11/30/22 1536  INR 1.2   Thyroid Function Tests: No results for input(s): "TSH", "T4TOTAL", "FREET4", "T3FREE", "THYROIDAB" in the last 72 hours. Lipid Profile: No results for input(s): "CHOL", "HDL", "LDLCALC", "TRIG", "CHOLHDL", "LDLDIRECT" in the last 72 hours. Anemia Panel: No results for input(s): "VITAMINB12", "FOLATE", "FERRITIN", "TIBC", "IRON", "RETICCTPCT" in the last 72 hours. Urine analysis:    Component  Value Date/Time   COLORURINE YELLOW 12/07/2014 1339   APPEARANCEUR CLEAR 12/07/2014 1339   LABSPEC 1.020 12/07/2014 1339   PHURINE 7.5 12/07/2014 1339   GLUCOSEU NEGATIVE 12/07/2014 1339   HGBUR NEGATIVE 12/07/2014 1339   BILIRUBINUR NEGATIVE 12/07/2014 1339   KETONESUR NEGATIVE 12/07/2014 1339   PROTEINUR 30 (A) 12/07/2014 1339   UROBILINOGEN 1.0 12/07/2014 1339   NITRITE NEGATIVE 12/07/2014 1339   LEUKOCYTESUR NEGATIVE 12/07/2014 1339   Sepsis Labs: Invalid input(s): "PROCALCITONIN", "LACTICIDVEN"   SIGNED:  Almon Hercules, MD  [facility] 12/02/2022, 2:15 PM

## 2022-12-02 NOTE — Assessment & Plan Note (Signed)
A1c 6.3 on labs from last month.  He remains focused on dietary modifications aimed at lowering his blood sugar.

## 2022-12-02 NOTE — Assessment & Plan Note (Addendum)
Persistent issue.  He continues to endorse right-sided neck pain that radiates to both shoulders.  He has recently established care with neurosurgery given his history of cervical fusion in 2005.  MRI of the cervical spine is pending.  Symptoms have not progressed but also have not improved.  He is understandably frustrated.  He has previously attempted PT but this did not alleviate his symptoms. -MRI of the cervical spine is pending -Neurosurgery follow-up is pending MRI results

## 2022-12-02 NOTE — Assessment & Plan Note (Signed)
Remains euvolemic on exam.  Currently prescribed Farxiga, lisinopril, and metoprolol succinate. -I have recommended that he contact his cardiologist to schedule follow-up.

## 2022-12-02 NOTE — Telephone Encounter (Signed)
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   Per prior discussion with patient, he is celebrating his wife's birthday this weekend and requested to discuss the results early next week.  I will call to let the patient know then and also place referral for oncology visit.  Melody Comas, MD [facility] Office: [phone]   See Amion for personal pager PCCM on call pager [phone] until 7pm. Please call Elink 7p-7a. [phone]

## 2022-12-02 NOTE — Progress Notes (Signed)
Transition of Care [facility]) - Inpatient Brief Assessment   Patient Details  Name: Rodney Mahoney MRN: [id number] Date of Birth: [birth year]  [facility] CM/SW Contact:    Janae Bridgeman, RN Phone Number: 12/02/2022, 8:59 AM   Clinical Narrative:    Transition of Care Asessment: Insurance and Status: (P) Insurance coverage has been reviewed Patient has primary care physician: (P) Yes Home environment has been reviewed: (P) Yes Prior level of function:: (P) Independent with wife Prior/Current Home Services: (P) No current home services Social Determinants of Health Reivew: (P) SDOH reviewed no interventions necessary Readmission risk has been reviewed: (P) Yes Transition of care needs: (P) no transition of care needs at this time

## 2022-12-02 NOTE — Assessment & Plan Note (Signed)
Remains well-controlled on current antihypertensive regimen.  No medication changes are indicated today.

## 2022-12-03 ENCOUNTER — Other Ambulatory Visit: Payer: Self-pay

## 2022-12-03 ENCOUNTER — Emergency Department (HOSPITAL_COMMUNITY): Payer: 59

## 2022-12-03 ENCOUNTER — Emergency Department (HOSPITAL_COMMUNITY)
Admission: EM | Admit: 2022-12-03 | Discharge: 2022-12-04 | Disposition: A | Discharge status: Home / Self Care | Payer: 59 | Attending: Emergency Medicine | Admitting: Emergency Medicine

## 2022-12-03 DIAGNOSIS — Z85118 Personal history of other malignant neoplasm of bronchus and lung: Secondary | ICD-10-CM | POA: Diagnosis not present

## 2022-12-03 DIAGNOSIS — I2510 Atherosclerotic heart disease of native coronary artery without angina pectoris: Secondary | ICD-10-CM | POA: Diagnosis not present

## 2022-12-03 DIAGNOSIS — R918 Other nonspecific abnormal finding of lung field: Secondary | ICD-10-CM | POA: Diagnosis not present

## 2022-12-03 DIAGNOSIS — J90 Pleural effusion, not elsewhere classified: Secondary | ICD-10-CM | POA: Diagnosis not present

## 2022-12-03 DIAGNOSIS — R000 Tachycardia, unspecified: Secondary | ICD-10-CM | POA: Insufficient documentation

## 2022-12-03 DIAGNOSIS — Z7982 Long term (current) use of aspirin: Secondary | ICD-10-CM | POA: Diagnosis not present

## 2022-12-03 DIAGNOSIS — R0602 Shortness of breath: Secondary | ICD-10-CM | POA: Diagnosis not present

## 2022-12-03 DIAGNOSIS — E871 Hypo-osmolality and hyponatremia: Secondary | ICD-10-CM | POA: Insufficient documentation

## 2022-12-03 DIAGNOSIS — R079 Chest pain, unspecified: Secondary | ICD-10-CM | POA: Diagnosis not present

## 2022-12-03 DIAGNOSIS — R590 Localized enlarged lymph nodes: Secondary | ICD-10-CM | POA: Diagnosis not present

## 2022-12-03 DIAGNOSIS — Z79899 Other long term (current) drug therapy: Secondary | ICD-10-CM | POA: Diagnosis not present

## 2022-12-03 DIAGNOSIS — D72829 Elevated white blood cell count, unspecified: Secondary | ICD-10-CM | POA: Insufficient documentation

## 2022-12-03 DIAGNOSIS — R0789 Other chest pain: Secondary | ICD-10-CM | POA: Diagnosis not present

## 2022-12-03 LAB — ANAEROBIC CULTURE W GRAM STAIN

## 2022-12-03 LAB — CULTURE, BAL-QUANTITATIVE W GRAM STAIN

## 2022-12-03 LAB — AEROBIC/ANAEROBIC CULTURE W GRAM STAIN (SURGICAL/DEEP WOUND)

## 2022-12-03 MED ORDER — HYDROCODONE-ACETAMINOPHEN 5-325 MG PO TABS
1.0000 | ORAL_TABLET | Freq: Once | ORAL | Status: AC
  Administered 2022-12-03: 1 via ORAL
  Filled 2022-12-03: qty 1

## 2022-12-03 MED ORDER — HYDROMORPHONE HCL 1 MG/ML IJ SOLN
0.5000 mg | Freq: Once | INTRAMUSCULAR | Status: AC
  Administered 2022-12-04: 0.5 mg via INTRAVENOUS
  Filled 2022-12-03: qty 1

## 2022-12-03 NOTE — ED Triage Notes (Addendum)
Pt. Stated, I had Bronchoscopy with fluro yesterday cause of cancer and they didn't give me anything for pain, nothing. I can't take the pain. I took some night time muscle relaxant but not doing any good. Told me not to take Ibuprofen, and tylenol doesn't touch it.Marland Kitchen

## 2022-12-03 NOTE — ED Provider Notes (Signed)
[facility] AT [facility] Provider Note   CSN: [id number] Arrival date & time: 12/03/22  1336     History  Chief Complaint  Patient presents with   Post-op Problem   HPI Rodney Mahoney is a 62 y.o. male with CAD, history of NSTEMI, and recent diagnosis of squamous cell lung cancer discovered on bronchoscopy 2 days ago presenting for chest pain.  States this chest pain started just after his discharge.  It is in the center of his chest and at times radiates to his back.  Feels like pressure.  States he is intermittently been coughing up blood and he has been short of breath.  Chest pain worse with exertion and with coughing. Shortness of breath is worse with exertion.  Denies calf tenderness.  Denies recent immobilization.  States he is here because he needs pain medicine.  HPI     Home Medications Prior to Admission medications   Medication Sig Start Date End Date Taking? Authorizing Provider  HYDROcodone-acetaminophen (NORCO/VICODIN) 5-325 MG tablet Take 2 tablets by mouth every 4 (four) hours as needed. 12/04/22  Yes Gareth Eagle, PA-C  acetaminophen (TYLENOL) 500 MG tablet Take 500 mg by mouth every 6 (six) hours as needed for mild pain.    [provider]  albuterol (VENTOLIN HFA) 108 (90 Base) MCG/ACT inhaler Inhale 2 puffs into the lungs every 6 (six) hours as needed for wheezing or shortness of breath. 12/02/22   Almon Hercules, MD  aspirin EC 81 MG tablet Take 1 tablet (81 mg total) by mouth daily. Swallow whole. 06/27/21   Furth, Cadence H, PA-C  atorvastatin (LIPITOR) 80 MG tablet Take 1 tablet (80 mg total) by mouth daily. TAKE 1 TABLET BY MOUTH EVERY DAY AT 6 PM Patient taking differently: Take 80 mg by mouth at bedtime. 08/25/22 02/21/23  Billie Lade, MD  dapagliflozin propanediol (FARXIGA) 10 MG TABS tablet Take 1 tablet (10 mg total) by mouth daily before breakfast. 08/25/22   Billie Lade, MD  ezetimibe (ZETIA) 10 MG  tablet Take 1 tablet (10 mg total) by mouth daily. 12/03/21   Donell Beers, FNP  lisinopril (ZESTRIL) 10 MG tablet Take 1 tablet by mouth once daily 12/02/22   [name], MD  Menthol, Topical Analgesic, (BIOFREEZE EX) Apply 1 Application topically daily as needed (pain).    [provider]  metoprolol succinate (TOPROL-XL) 25 MG 24 hr tablet Take 1 tablet by mouth once daily 12/02/22   Billie Lade, MD  nitroGLYCERIN (NITROSTAT) 0.4 MG SL tablet Place 1 tablet (0.4 mg total) under the tongue every 5 (five) minutes as needed. 02/02/18   Arty Baumgartner, NP      Allergies    Patient has no known allergies.    Review of Systems   See HPI for pertinent positives  Physical Exam   Vitals:   12/03/22 2135 12/04/22 0002  BP: 124/68 (!) 127/92  Pulse: (!) 105 (!) 105  Resp: 17 18  Temp: 98.7 F (37.1 C) 98.7 F (37.1 C)  SpO2: 94% 94%    CONSTITUTIONAL:  well-appearing, NAD NEURO:  Alert and oriented x 3, CN 3-12 grossly intact EYES:  eyes equal and reactive ENT/NECK:  Supple, no stridor  CARDIO:  tachycardia and regular rhythm, appears well-perfused  PULM:  No respiratory distress, CTAB GI/GU:  non-distended, soft MSK/SPINE:  No gross deformities, no edema, moves all extremities  SKIN:  no rash, atraumatic  *Additional  and/or pertinent findings included in MDM below  ED Results / Procedures / Treatments   Labs (all labs ordered are listed, but only abnormal results are displayed) Labs Reviewed  BASIC METABOLIC PANEL - Abnormal; Notable for the following components:      Result Value   Sodium 130 (*)    Chloride 94 (*)    Glucose, Bld 107 (*)    All other components within normal limits  CBC - Abnormal; Notable for the following components:   WBC 14.7 (*)    RBC 4.20 (*)    Hemoglobin 12.0 (*)    HCT 36.6 (*)    All other components within normal limits  BRAIN NATRIURETIC PEPTIDE  CK  TROPONIN I (HIGH SENSITIVITY)  TROPONIN I (HIGH  SENSITIVITY)    EKG EKG Interpretation  Date/Time:  Thursday December 03 2022 23:08:15 EDT Ventricular Rate:  104 PR Interval:  134 QRS Duration: 104 QT Interval:  334 QTC Calculation: 439 R Axis:   3 Text Interpretation: Sinus tachycardia with occasional Premature ventricular complexes Left ventricular hypertrophy ( R in aVL , Sokolow-Lyon , Cornell product ) Abnormal ECG When compared with ECG of 02-Dec-2022 01:58, PREVIOUS ECG IS PRESENT No significant change was found Confirmed by Kennis Carina [phone]) on 12/03/2022 11:12:19 PM  Radiology CT Angio Chest PE W/Cm &/Or Wo Cm  Result Date: 12/04/2022 CLINICAL DATA:  Chest pain EXAM: CT ANGIOGRAPHY CHEST WITH CONTRAST TECHNIQUE: Multidetector CT imaging of the chest was performed using the standard protocol during bolus administration of intravenous contrast. Multiplanar CT image reconstructions and MIPs were obtained to evaluate the vascular anatomy. RADIATION DOSE REDUCTION: This exam was performed according to the departmental dose-optimization program which includes automated exposure control, adjustment of the mA and/or kV according to patient size and/or use of iterative reconstruction technique. CONTRAST:  75mL OMNIPAQUE IOHEXOL 350 MG/ML SOLN COMPARISON:  11/30/2022 FINDINGS: Cardiovascular: Contrast injection is sufficient to demonstrate satisfactory opacification of the pulmonary arteries to the segmental level. There is no pulmonary embolus or evidence of right heart strain. The size of the main pulmonary artery is normal. Heart size is normal, with no pericardial effusion. The course and caliber of the aorta are normal. There is atherosclerotic calcification. Opacification decreased due to pulmonary arterial phase contrast bolus timing. Mediastinum/Nodes: There is mediastinal extension of the left upper lobe, left hilar mass, unchanged. Encasement of the left upper lobe arterial branches is unchanged. Bulky mediastinal adenopathy is  unchanged. Lungs/Pleura: Unchanged appearance of left upper lobe mass, measuring 7.1 x 6.0 cm. Small left pleural effusion. Upper Abdomen: Contrast bolus timing is not optimized for evaluation of the abdominal organs. The visualized portions of the organs of the upper abdomen are normal. Musculoskeletal: Unchanged appearance of superior right sternal lytic lesion. Review of the MIP images confirms the above findings. IMPRESSION: 1. No pulmonary embolus or acute aortic syndrome. 2. Unchanged appearance of left upper lobe mass with mediastinal extension and bulky mediastinal adenopathy. 3. Small left pleural effusion. Aortic Atherosclerosis (ICD10-I70.0). Electronically Signed   By: Deatra  M.D.   On: 12/04/2022 01:54   DG Chest Port 1 View  Result Date: 12/03/2022 CLINICAL DATA:  Chest pain. EXAM: PORTABLE CHEST 1 VIEW COMPARISON:  12/02/2022. FINDINGS: Heart is enlarged and the mediastinal contour is within normal limits. Lung volumes are low and there is chronic elevation of the left diaphragm. A stable focal opacities noted in the left upper lobe, compatible with known mass. No effusion or pneumothorax. Cervical spinal fusion hardware is  noted. No acute osseous abnormality. IMPRESSION: Stable opacity in the left upper lobe, compatible with known mass. No acute abnormality is seen. Electronically Signed   By: Thornell Sartorius M.D.   On: 12/03/2022 23:11    Procedures Procedures    Medications Ordered in ED Medications  HYDROcodone-acetaminophen (NORCO/VICODIN) 5-325 MG per tablet 1 tablet (1 tablet Oral Given 12/03/22 2327)  HYDROmorphone (DILAUDID) injection 0.5 mg (0.5 mg Intravenous Given 12/04/22 0006)  iohexol (OMNIPAQUE) 350 MG/ML injection 75 mL (75 mLs Intravenous Contrast Given 12/04/22 0143)    ED Course/ Medical Decision Making/ A&P                             Medical Decision Making Amount and/or Complexity of Data Reviewed Labs: ordered. Radiology: ordered.  Risk Prescription  drug management.   Initial Impression and Ddx 62 year old well-appearing male presenting for chest pain.  Exam is unremarkable.  DDx includes ACS, PE, malignancy, pneumonia, pneumothorax. Patient PMH that increases complexity of ED encounter:  new dx of squamous cell lung cancer   Interpretation of Diagnostics I independent reviewed and interpreted the labs as followed: leukocytosis, hyponatremia  - I independently visualized the following imaging with scope of interpretation limited to determining acute life threatening conditions related to emergency care: CT angio chest, which revealed small left pleural effusion but otherwise unchanged and negative for PE  -I personally reviewed and interpreted EKG which revealed sinus tachycardia and occasional PVC  Patient Reassessment and Ultimate Disposition/Management Given newly diagnosed lung cancer and chest pain shortness of breath or some concern for PE.  CT was negative fortunately.  Treated his pain and on reevaluation stated that he felt his symptoms improved.  Workup was relatively unremarkable.  Suspect his symptoms could be related to his lung cancer and recent bronchoscopy.  Advised to follow-up with [facility].  Vitals remained stable throughout encounter.  Sent Norco to his pharmacy for ongoing postop pain.  Discussed pertinent return precautions.  Discharged home in good condition.  Patient management required discussion with the following services or consulting groups:  None  Complexity of Problems Addressed Acute complicated illness or Injury  Additional Data Reviewed and Analyzed Further history obtained from: Past medical history and medications listed in the EMR, Prior ED visit notes, and Recent discharge summary  Patient Encounter Risk Assessment Prescriptions         Final Clinical Impression(s) / ED Diagnoses Final diagnoses:  Chest pain, unspecified type    Rx / DC Orders ED Discharge Orders           Ordered    HYDROcodone-acetaminophen (NORCO/VICODIN) 5-325 MG tablet  Every 4 hours PRN        12/04/22 0237              Gareth Eagle, PA-C 12/04/22 0238    Sabas Sous, MD 12/04/22 [phone]

## 2022-12-04 ENCOUNTER — Emergency Department (HOSPITAL_COMMUNITY): Payer: 59

## 2022-12-04 ENCOUNTER — Telehealth (HOSPITAL_COMMUNITY): Payer: Self-pay | Admitting: Emergency Medicine

## 2022-12-04 ENCOUNTER — Encounter (HOSPITAL_COMMUNITY): Payer: Self-pay | Admitting: Pulmonary Disease

## 2022-12-04 ENCOUNTER — Other Ambulatory Visit (HOSPITAL_COMMUNITY): Payer: Self-pay

## 2022-12-04 DIAGNOSIS — R079 Chest pain, unspecified: Secondary | ICD-10-CM | POA: Diagnosis not present

## 2022-12-04 DIAGNOSIS — R590 Localized enlarged lymph nodes: Secondary | ICD-10-CM | POA: Diagnosis not present

## 2022-12-04 DIAGNOSIS — J90 Pleural effusion, not elsewhere classified: Secondary | ICD-10-CM | POA: Diagnosis not present

## 2022-12-04 DIAGNOSIS — R918 Other nonspecific abnormal finding of lung field: Secondary | ICD-10-CM | POA: Diagnosis not present

## 2022-12-04 LAB — BASIC METABOLIC PANEL
Anion gap: 9 (ref 5–15)
BUN: 14 mg/dL (ref 8–23)
CO2: 27 mmol/L (ref 22–32)
Calcium: 9.9 mg/dL (ref 8.9–10.3)
Chloride: 94 mmol/L — ABNORMAL LOW (ref 98–111)
Creatinine, Ser: 0.78 mg/dL (ref 0.61–1.24)
GFR, Estimated: >60 mL/min (ref >60)
Glucose, Bld: 107 mg/dL — ABNORMAL HIGH (ref 70–99)
Potassium: 3.9 mmol/L (ref 3.5–5.1)
Sodium: 130 mmol/L — ABNORMAL LOW (ref 135–145)

## 2022-12-04 LAB — AEROBIC/ANAEROBIC CULTURE W GRAM STAIN (SURGICAL/DEEP WOUND): Gram Stain: NONE SEEN

## 2022-12-04 LAB — CBC
HCT: 36.6 % — ABNORMAL LOW (ref 39.0–52.0)
Hemoglobin: 12 g/dL — ABNORMAL LOW (ref 13.0–17.0)
MCH: 28.6 pg (ref 26.0–34.0)
MCHC: 32.8 g/dL (ref 30.0–36.0)
MCV: 87.1 fL (ref 80.0–100.0)
Platelets: 381 10*3/uL (ref 150–400)
RBC: 4.2 MIL/uL — ABNORMAL LOW (ref 4.22–5.81)
RDW: 14 % (ref 11.5–15.5)
WBC: 14.7 10*3/uL — ABNORMAL HIGH (ref 4.0–10.5)
nRBC: 0 % (ref 0.0–0.2)

## 2022-12-04 LAB — CK: Total CK: 87 U/L (ref 49–397)

## 2022-12-04 LAB — CULTURE, BAL-QUANTITATIVE W GRAM STAIN

## 2022-12-04 LAB — TROPONIN I (HIGH SENSITIVITY)
Troponin I (High Sensitivity): 11 ng/L (ref <18)
Troponin I (High Sensitivity): 13 ng/L (ref <18)

## 2022-12-04 LAB — BRAIN NATRIURETIC PEPTIDE: B Natriuretic Peptide: 39.7 pg/mL (ref 0.0–100.0)

## 2022-12-04 MED ORDER — HYDROCODONE-ACETAMINOPHEN 5-325 MG PO TABS: 2.0000 | ORAL_TABLET | ORAL | 0 refills | Status: DC | PRN

## 2022-12-04 MED ORDER — OXYCODONE-ACETAMINOPHEN 5-325 MG PO TABS
1.0000 | ORAL_TABLET | Freq: Once | ORAL | Status: DC
  Filled 2022-12-04: qty 1

## 2022-12-04 MED ORDER — IOHEXOL 350 MG/ML SOLN
75.0000 mL | Freq: Once | INTRAVENOUS | Status: AC | PRN
  Administered 2022-12-04: 75 mL via INTRAVENOUS

## 2022-12-04 NOTE — Discharge Instructions (Addendum)
Evaluation for your chest pain was overall reassuring.  Suspect it is related to your recent bronchoscopy.  I have sent Norco to your pharmacy for acute pain in the next couple days.  Also recommend that you continue to follow-up with your pulmonary doctor especially for the findings from your recent biopsy.  Worsening chest pain, shortness of breath, calf tenderness or any other concerning symptom please return emerged part for further evaluation.

## 2022-12-05 MED ORDER — HYDROCODONE-ACETAMINOPHEN 5-325 MG PO TABS: 1.0000 | ORAL_TABLET | ORAL | 0 refills | Status: DC | PRN

## 2022-12-05 NOTE — Telephone Encounter (Signed)
Changed Norco from 2 tablets to 1 PRN q4 hours

## 2022-12-06 LAB — ANAEROBIC CULTURE W GRAM STAIN

## 2022-12-07 ENCOUNTER — Telehealth: Payer: Self-pay

## 2022-12-07 ENCOUNTER — Telehealth: Payer: Self-pay | Admitting: Pulmonary Disease

## 2022-12-07 DIAGNOSIS — C3492 Malignant neoplasm of unspecified part of left bronchus or lung: Secondary | ICD-10-CM

## 2022-12-07 DIAGNOSIS — C3490 Malignant neoplasm of unspecified part of unspecified bronchus or lung: Secondary | ICD-10-CM

## 2022-12-07 MED ORDER — HYDROCODONE-ACETAMINOPHEN 7.5-325 MG PO TABS
1.00 | ORAL_TABLET | Freq: Three times a day (TID) | ORAL | 0 refills | Status: DC | PRN
Start: 2022-12-07 — End: 2022-12-22

## 2022-12-07 NOTE — Telephone Encounter (Signed)
Pt has called in complaining out the neck pains and he is stating now that the ins will not approve any medicines because it ran out

## 2022-12-07 NOTE — Telephone Encounter (Signed)
*  Pulm  PA request received for HYDROcodone-Acetaminophen 7.5-325MG  tablets  PA submitted to [employer] via CMM and is pending additional questions/determination  *patient recently diagnosed with cancer   Key: [facility]

## 2022-12-07 NOTE — Telephone Encounter (Signed)
I spoke to pt and he stated the pharmacy will give him 2 days worth of medication for HYDROcodone-acetaminophen (NORCO) 7.5-325 MG tablet and I informed him that the prior auth for this medication is still pending as per out prior auth team. Pt was upset but verbalized understanding. I also let pt know that as soon as prior Berkley Harvey goes through he will get the full supply of the medication. Nothing further needed.

## 2022-12-07 NOTE — Telephone Encounter (Signed)
I spoke with patient over the phone and reviewed his pathology results with him. I informed him and his wife of the diagnosis of non-small cell lung cancer as the pathology shows squamous cell lung cancer.   He went to the ER on 6/20 for chest discomfort and was given oxycodone-acetaminophen 5mg -325mg , 8 tabs, PRN q 4 hours. He is requesting more pain medicine for chest pain since having bronchoscopy.   Plan: - refer to medical Oncology - Order MRI Brain and NM PET CT scan for staging - Norco 7.5mg  tablet, q8hrs PRN for pain  Melody Comas, MD [facility] Office: [phone]   See Amion for personal pager PCCM on call pager [phone] until 7pm. Please call Elink 7p-7a. [phone]

## 2022-12-08 NOTE — Telephone Encounter (Signed)
PA has been APPROVED from 12/08/2022-12/08/2023 

## 2022-12-10 ENCOUNTER — Other Ambulatory Visit: Payer: Self-pay

## 2022-12-10 NOTE — Progress Notes (Signed)
The proposed treatment discussed in conference is for discussion purpose only and is not a binding recommendation.  The patients have not been physically examined, or presented with their treatment options.  Therefore, final treatment plans cannot be decided.  

## 2022-12-10 NOTE — Progress Notes (Signed)
Thoracic Location of Tumor / Histology: Left Lung upper lobe  12/03/2022 Riki Sheer, PA-C CT Angio Chest PE with contrast/ without contrast CLINICAL DATA: Chest pain   IMPRESSION: 1. No pulmonary embolus or acute aortic syndrome. 2. Unchanged appearance of left upper lobe mass with mediastinal extension and bulky mediastinal adenopathy. 3. Small left pleural effusion. Aortic Atherosclerosis (ICD10-I70.0).  12/01/2022 Dr. Melody Comas DG Chest Port 1 View CLINICAL DATA: Status post bronchoscopy with biopsy.   IMPRESSION: No definite pneumothorax or pleural effusion status post bronchoscopy and biopsy of left upper lobe lung mass.  11/30/2022 Dr. Edwin Dada CT Angio Chest PE with/without Contrast CLINICAL DATA: High probability for PE with left-sided chest pain.  IMPRESSION: 1. No evidence for pulmonary embolism. 2. Left upper lobe mass with central necrosis worrisome for primary bronchogenic carcinoma. 3. Bulky mediastinal, left hilar and left lower neck lymphadenopathy worrisome for metastatic disease. 4. Lytic lesion in the right superior sternum with soft tissue component extending into the anterior mediastinum worrisome for metastatic disease. 5. 8 mm right upper lobe pulmonary nodule worrisome for metastatic disease. Aortic Atherosclerosis (ICD10-I70.0)  11/30/2022 Dr. Edwin Dada DG Chest 2 Views CLINICAL DATA: Chest pain.   IMPRESSION: Left upper lobe density is noted most consistent with left upper lobe mass or malignancy with associated atelectasis. CT scan of the chest with intravenous contrast is recommended for further evaluation.  Tobacco/Marijuana/Snuff/ETOH use:  Yes, smoke cigarettes, marijuana, and drinks alcohol.    Past/Anticipated interventions by cardiothoracic surgery, if any:   12/01/2022 Dr. Clayburn Pert Video Bronchoscopy with Fluoro  Past/Anticipated interventions by medical oncology, if any:    Signs/Symptoms Weight  changes, if any: No Respiratory complaints, if any:  SOB with exertion Hemoptysis, if any: No Pain issues, if any:  8/10  SAFETY ISSUES: Prior radiation? No Pacemaker/ICD?  No Possible current pregnancy? Male Is the patient on methotrexate? No  Current Complaints / other details:   Cardiac stent on the right. Surgery on neck has screws.

## 2022-12-11 ENCOUNTER — Telehealth: Payer: Self-pay

## 2022-12-11 NOTE — Transitions of Care (Post Inpatient/ED Visit) (Signed)
   12/11/2022  Name: IGNATIUS KIFER MRN: [id number] DOB: Nov 20, 1960  Today's TOC FU Call Status: Today's TOC FU Call Status:: Unsuccessul Call (1st Attempt) Unsuccessful Call (1st Attempt) Date: 12/11/22  Attempted to reach the patient regarding the most recent Inpatient/ED visit.  Follow Up Plan: Additional outreach attempts will be made to reach the patient to complete the Transitions of Care (Post Inpatient/ED visit) call.   Jodelle Gross, RN, BSN, CCM Care Management Coordinator [place] [facility]

## 2022-12-14 ENCOUNTER — Ambulatory Visit
Admission: RE | Admit: 2022-12-14 | Discharge: 2022-12-14 | Disposition: A | Discharge status: Home / Self Care | Payer: 59 | Source: Ambulatory Visit | Attending: Radiation Oncology | Admitting: Radiation Oncology

## 2022-12-14 ENCOUNTER — Other Ambulatory Visit: Payer: Self-pay

## 2022-12-14 ENCOUNTER — Telehealth: Payer: Self-pay

## 2022-12-14 ENCOUNTER — Encounter: Payer: Self-pay | Admitting: Radiation Oncology

## 2022-12-14 VITALS — BP 111/69 | HR 78 | Temp 97.4°F | Resp 20 | Ht 66.0 in | Wt 188.2 lb

## 2022-12-14 DIAGNOSIS — Z7982 Long term (current) use of aspirin: Secondary | ICD-10-CM | POA: Insufficient documentation

## 2022-12-14 DIAGNOSIS — Z7984 Long term (current) use of oral hypoglycemic drugs: Secondary | ICD-10-CM | POA: Diagnosis not present

## 2022-12-14 DIAGNOSIS — I255 Ischemic cardiomyopathy: Secondary | ICD-10-CM | POA: Diagnosis not present

## 2022-12-14 DIAGNOSIS — R590 Localized enlarged lymph nodes: Secondary | ICD-10-CM | POA: Insufficient documentation

## 2022-12-14 DIAGNOSIS — F17210 Nicotine dependence, cigarettes, uncomplicated: Secondary | ICD-10-CM | POA: Insufficient documentation

## 2022-12-14 DIAGNOSIS — I871 Compression of vein: Secondary | ICD-10-CM | POA: Insufficient documentation

## 2022-12-14 DIAGNOSIS — I252 Old myocardial infarction: Secondary | ICD-10-CM | POA: Diagnosis not present

## 2022-12-14 DIAGNOSIS — E785 Hyperlipidemia, unspecified: Secondary | ICD-10-CM | POA: Diagnosis not present

## 2022-12-14 DIAGNOSIS — C3412 Malignant neoplasm of upper lobe, left bronchus or lung: Secondary | ICD-10-CM

## 2022-12-14 DIAGNOSIS — M2578 Osteophyte, vertebrae: Secondary | ICD-10-CM | POA: Diagnosis not present

## 2022-12-14 DIAGNOSIS — Z510 Encounter for antineoplastic radiation therapy: Secondary | ICD-10-CM | POA: Insufficient documentation

## 2022-12-14 DIAGNOSIS — Z79899 Other long term (current) drug therapy: Secondary | ICD-10-CM | POA: Insufficient documentation

## 2022-12-14 DIAGNOSIS — I2510 Atherosclerotic heart disease of native coronary artery without angina pectoris: Secondary | ICD-10-CM | POA: Diagnosis not present

## 2022-12-14 DIAGNOSIS — I700 Atherosclerosis of aorta: Secondary | ICD-10-CM | POA: Insufficient documentation

## 2022-12-14 DIAGNOSIS — J90 Pleural effusion, not elsewhere classified: Secondary | ICD-10-CM | POA: Diagnosis not present

## 2022-12-14 DIAGNOSIS — I10 Essential (primary) hypertension: Secondary | ICD-10-CM | POA: Insufficient documentation

## 2022-12-14 NOTE — Progress Notes (Signed)
  Radiation Oncology         [phone] ________________________________  Name: Rodney Mahoney MRN: [id number]  Date: 12/14/2022  DOB: July 09, 1960  SIMULATION AND TREATMENT PLANNING NOTE    ICD-10-CM   1. Primary cancer of left upper lobe of lung (HCC)  C34.12     2. 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  Identity was confirmed.  All relevant records and images related to the planned course of therapy were reviewed.  The patient freely provided informed written consent to proceed with treatment after reviewing the details related to the planned course of therapy. The consent form was witnessed and verified by the simulation staff.  Then, the patient was set-up in a stable reproducible  supine position for radiation therapy.  CT images were obtained.  Surface markings were placed.  The CT images were loaded into the planning software.  Then the target and avoidance structures were contoured.  Treatment planning then occurred.  The radiation prescription was entered and confirmed.  Then, I designed and supervised the construction of a total of 6 medically necessary complex treatment devices, including a BodyFix immobilization mold custom fitted to the patient along with 5 multileaf collimators conformally shaped radiation around the treatment target while shielding critical structures such as the heart and spinal cord maximally.  I have requested : 3D Simulation  I have requested a DVH of the following structures: Left lung, right lung, spinal cord, heart, esophagus, and target.  I have ordered:Nutrition Consult  SPECIAL TREATMENT PROCEDURE:  The planned course of therapy using radiation constitutes a special treatment procedure. Special care is required in the management  of this patient for the following reasons.  The patient will be receiving concurrent chemotherapy requiring careful monitoring for increased toxicities of treatment including periodic laboratory values.  The special nature of the planned course of radiotherapy will require increased physician supervision and oversight to ensure patient's safety with optimal treatment outcomes.  PLAN:  The patient will receive 30 Gy in 10 fractions followed by 30 Gy in 15 fractions to 60 Gy if PET negative for widespread disease.  ________________________________  Artist Pais. Kathrynn Running, M.D.

## 2022-12-14 NOTE — Progress Notes (Signed)
Radiation Oncology         [phone] ________________________________  Initial outpatient Consultation  Name: Rodney Mahoney MRN: [id number]  Date of Service: 12/14/2022 DOB: 1961-01-24  WJ:XBJYN, Lucina Mellow, MD  Si Gaul, MD   REFERRING PHYSICIAN: Si Gaul, MD  DIAGNOSIS: 62 y.o. 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 Malignant neoplasm of upper lobe of left lung (HCC)  C34.12       HISTORY OF PRESENT ILLNESS: Rodney Mahoney is a 62 y.o. male seen at the request of Dr. Francine Graven. He was initially referred to the lung cancer screening program in 08/2021, but he did not show for his scheduled CT scan. More recently, he presented to the ED on 11/30/22 with left-sided chest pain. He underwent angio chest CT showing: left upper lobe mass with central necrosis; bulky mediastinal , left hilar, and left lower neck lymphadenopathy; lytic lesion in right superior sternum with soft tissue component extending into anterior mediastinum; 8 mm right upper lobe pulmonary nodule. He was admitted and underwent bronchoscopy the following day under Dr. Francine Graven. Pathology from the procedure revealed invasive moderately differentiated squamous cell carcinoma.  He is scheduled for staging PET scan on 12/16/22 and brain MRI on 12/18/22. He is also scheduled to meet Dr. Ellin Saba on 12/22/22.  PREVIOUS RADIATION THERAPY: No  PAST MEDICAL HISTORY:  Past Medical History:  Diagnosis Date   Bronchitis    CAD (coronary artery disease)    a. cath 11/26/2014 EF 45%, moderate to severe inferior hypokinesis, 90% small D1, 100% mid to distal RCA with minimal collateral, 40% mid to distal LCx. Medical therapy as RCA occlusion appears to be completed event; DES to mid circumflex October 2019   Essential hypertension    Hyperlipidemia    Ischemic cardiomyopathy    NSTEMI (non-ST elevated myocardial infarction) (HCC)     11/26/14, 01/31/18      PAST SURGICAL HISTORY: Past Surgical History:  Procedure Laterality Date   BRONCHIAL BIOPSY  12/01/2022   Procedure: BRONCHIAL BIOPSIES;  Surgeon: Martina Sinner, MD;  Location: [facility] ENDOSCOPY;  Service: Pulmonary;;   BRONCHIAL BRUSHINGS  12/01/2022   Procedure: BRONCHIAL BRUSHINGS;  Surgeon: Martina Sinner, MD;  Location: [facility] ENDOSCOPY;  Service: Pulmonary;;   BRONCHIAL WASHINGS  12/01/2022   Procedure: BRONCHIAL WASHINGS;  Surgeon: Martina Sinner, MD;  Location: [facility] ENDOSCOPY;  Service: Pulmonary;;   CARDIAC CATHETERIZATION N/A 11/26/2014   Procedure: Left Heart Cath and Coronary Angiography;  Surgeon: Peter M Swaziland, MD;  Location: [facility] INVASIVE CV LAB;  Service: Cardiovascular;  Laterality: N/A;   COLONOSCOPY     per patient: done in [place], age 28, couple of polyps.   COLONOSCOPY WITH PROPOFOL N/A 10/08/2022   Procedure: COLONOSCOPY WITH PROPOFOL;  Surgeon: Lanelle Bal, DO;  Location: [facility] ENDO SUITE;  Service: Endoscopy;  Laterality: N/A;  11:00 am   CORONARY STENT INTERVENTION N/A 01/31/2018   Procedure: CORONARY STENT INTERVENTION;  Surgeon: Runell Gess, MD;  Location: [facility] INVASIVE CV LAB;  Service: Cardiovascular;  Laterality: N/A;   HEMOSTASIS CONTROL  12/01/2022   Procedure: HEMOSTASIS CONTROL;  Surgeon: Martina Sinner, MD;  Location: [facility] ENDOSCOPY;  Service: Pulmonary;;  cold saline   LEFT HEART CATH AND CORONARY ANGIOGRAPHY N/A 01/31/2018   Procedure: LEFT HEART CATH AND CORONARY ANGIOGRAPHY;  Surgeon: Runell Gess, MD;  Location: [facility] INVASIVE CV LAB;  Service: Cardiovascular;  Laterality:  N/A;   NECK SURGERY  2005   VIDEO BRONCHOSCOPY N/A 12/01/2022   Procedure: VIDEO BRONCHOSCOPY WITH FLUORO;  Surgeon: Martina Sinner, MD;  Location: [facility] ENDOSCOPY;  Service: Pulmonary;  Laterality: N/A;    FAMILY HISTORY:  Family History  Problem Relation Age of Onset   Diabetes Mother    Heart attack Father    Cancer Sister    Hypertension Brother     Colon cancer Neg Hx    Prostate cancer Neg Hx    Lung cancer Neg Hx     SOCIAL HISTORY:  Social History   Socioeconomic History   Marital status: Married    Spouse name: Not on file   Number of children: Not on file   Years of education: Not on file   Highest education level: Not on file  Occupational History   Not on file  Tobacco Use   Smoking status: Every Day    Packs/day: 1.00    Years: 42.00    Additional pack years: 0.00    Total pack years: 42.00    Types: Cigarettes   Smokeless tobacco: Never   Tobacco comments:    He used to quit and start back. He has not quit for the past 5 years.   Vaping Use   Vaping Use: Never used  Substance and Sexual Activity   Alcohol use: Yes    Comment: 10 cans of beer   Drug use: Yes    Types: Marijuana    Comment: smokes week here and there   Sexual activity: Yes    Birth control/protection: None  Other Topics Concern   Not on file  Social History Narrative   Works at [employer] and live with wife.    Social Determinants of Health   Financial Resource Strain: Not on file  Food Insecurity: No Food Insecurity (12/14/2022)   Hunger Vital Sign    Worried About Running Out of Food in the Last Year: Never true    Ran Out of Food in the Last Year: Never true  Transportation Needs: No Transportation Needs (12/14/2022)   PRAPARE - Administrator, Civil Service (Medical): No    Lack of Transportation (Non-Medical): No  Physical Activity: Not on file  Stress: Not on file  Social Connections: Not on file  Intimate Partner Violence: Not At Risk (12/14/2022)   Humiliation, Afraid, Rape, and Kick questionnaire    Fear of Current or Ex-Partner: No    Emotionally Abused: No    Physically Abused: No    Sexually Abused: No    ALLERGIES: Patient has no known allergies.  MEDICATIONS:  Current Outpatient Medications  Medication Sig Dispense Refill   sildenafil (VIAGRA) 50 MG tablet Take 50 mg by mouth daily as needed.      acetaminophen (TYLENOL) 500 MG tablet Take 500 mg by mouth every 6 (six) hours as needed for mild pain.     albuterol (VENTOLIN HFA) 108 (90 Base) MCG/ACT inhaler Inhale 2 puffs into the lungs every 6 (six) hours as needed for wheezing or shortness of breath. 8 g 2   aspirin EC 81 MG tablet Take 1 tablet (81 mg total) by mouth daily. Swallow whole. 90 tablet 3   atorvastatin (LIPITOR) 80 MG tablet Take 1 tablet (80 mg total) by mouth daily. TAKE 1 TABLET BY MOUTH EVERY DAY AT 6 PM (Patient taking differently: Take 80 mg by mouth at bedtime.) 90 tablet 1   dapagliflozin propanediol (FARXIGA) 10 MG TABS  tablet Take 1 tablet (10 mg total) by mouth daily before breakfast. 90 tablet 3   ezetimibe (ZETIA) 10 MG tablet Take 1 tablet (10 mg total) by mouth daily. 90 tablet 3   HYDROcodone-acetaminophen (NORCO) 7.5-325 MG tablet Take 1 tablet by mouth every 8 (eight) hours as needed for moderate pain. 30 tablet 0   lisinopril (ZESTRIL) 10 MG tablet Take 1 tablet by mouth once daily 30 tablet 0   Menthol, Topical Analgesic, (BIOFREEZE EX) Apply 1 Application topically daily as needed (pain).     metoprolol succinate (TOPROL-XL) 25 MG 24 hr tablet Take 1 tablet by mouth once daily 90 tablet 0   nitroGLYCERIN (NITROSTAT) 0.4 MG SL tablet Place 1 tablet (0.4 mg total) under the tongue every 5 (five) minutes as needed. 25 tablet 0   No current facility-administered medications for this encounter.    REVIEW OF SYSTEMS:  On review of systems, the patient reports that he is doing well overall. He endorses dyspnea on exertion, intermittent cough, and left sided chest pain. He also endorses left upper extremity swelling and pain. He denies unintended weight loss, fevers, or chills.  He denies any bowel or bladder disturbances, and denies abdominal pain, nausea or vomiting. He denies any new musculoskeletal or joint aches or pains. A complete review of systems is obtained and is otherwise negative.    PHYSICAL  EXAM:  Wt Readings from Last 3 Encounters:  12/14/22 188 lb 3.2 oz (85.4 kg)  11/30/22 188 lb (85.3 kg)  11/25/22 188 lb 9.6 oz (85.5 kg)   Temp Readings from Last 3 Encounters:  12/14/22 (!) 97.4 F (36.3 C)  12/04/22 98.6 F (37 C) (Oral)  12/02/22 98.4 F (36.9 C)   BP Readings from Last 3 Encounters:  12/14/22 111/69  12/04/22 121/74  12/02/22 117/74   Pulse Readings from Last 3 Encounters:  12/14/22 78  12/04/22 99  12/02/22 90   Pain Assessment Pain Score: 8  Pain Loc: Chest (Left side chest, back. arm)/10  In general this is a well appearing man in no acute distress. He's alert and oriented x4 and appropriate throughout the examination. Cardiopulmonary assessment is negative for acute distress and he exhibits normal effort.  Left arm and hand swelling visualized. No numbness, tingling, or weakness in the left upper extremities.    KPS = 80  100 - Normal; no complaints; no evidence of disease. 90   - Able to carry on normal activity; minor signs or symptoms of disease. 80   - Normal activity with effort; some signs or symptoms of disease. 2   - Cares for self; unable to carry on normal activity or to do active work. 60   - Requires occasional assistance, but is able to care for most of his personal needs. 50   - Requires considerable assistance and frequent medical care. 40   - Disabled; requires special care and assistance. 30   - Severely disabled; hospital admission is indicated although death not imminent. 20   - Very sick; hospital admission necessary; active supportive treatment necessary. 10   - Moribund; fatal processes progressing rapidly. 0     - Dead  Karnofsky DA, Abelmann WH, Craver LS and Burchenal [facility] [phone]) The use of the nitrogen mustards in the palliative treatment of carcinoma: with particular reference to bronchogenic carcinoma Cancer 1 634-56  LABORATORY DATA:  Lab Results  Component Value Date   WBC 14.7 (H) 12/03/2022   HGB 12.0 (L)  12/03/2022  HCT 36.6 (L) 12/03/2022   MCV 87.1 12/03/2022   PLT 381 12/03/2022   Lab Results  Component Value Date   NA 130 (L) 12/03/2022   K 3.9 12/03/2022   CL 94 (L) 12/03/2022   CO2 27 12/03/2022   Lab Results  Component Value Date   ALT 22 10/28/2022   AST 19 10/28/2022   ALKPHOS 91 10/28/2022   BILITOT 0.3 10/28/2022     RADIOGRAPHY: CT Angio Chest PE W/Cm &/Or Wo Cm  Result Date: 12/04/2022 CLINICAL DATA:  Chest pain EXAM: CT ANGIOGRAPHY CHEST WITH CONTRAST TECHNIQUE: Multidetector CT imaging of the chest was performed using the standard protocol during bolus administration of intravenous contrast. Multiplanar CT image reconstructions and MIPs were obtained to evaluate the vascular anatomy. RADIATION DOSE REDUCTION: This exam was performed according to the departmental dose-optimization program which includes automated exposure control, adjustment of the mA and/or kV according to patient size and/or use of iterative reconstruction technique. CONTRAST:  75mL OMNIPAQUE IOHEXOL 350 MG/ML SOLN COMPARISON:  11/30/2022 FINDINGS: Cardiovascular: Contrast injection is sufficient to demonstrate satisfactory opacification of the pulmonary arteries to the segmental level. There is no pulmonary embolus or evidence of right heart strain. The size of the main pulmonary artery is normal. Heart size is normal, with no pericardial effusion. The course and caliber of the aorta are normal. There is atherosclerotic calcification. Opacification decreased due to pulmonary arterial phase contrast bolus timing. Mediastinum/Nodes: There is mediastinal extension of the left upper lobe, left hilar mass, unchanged. Encasement of the left upper lobe arterial branches is unchanged. Bulky mediastinal adenopathy is unchanged. Lungs/Pleura: Unchanged appearance of left upper lobe mass, measuring 7.1 x 6.0 cm. Small left pleural effusion. Upper Abdomen: Contrast bolus timing is not optimized for evaluation of the  abdominal organs. The visualized portions of the organs of the upper abdomen are normal. Musculoskeletal: Unchanged appearance of superior right sternal lytic lesion. Review of the MIP images confirms the above findings. IMPRESSION: 1. No pulmonary embolus or acute aortic syndrome. 2. Unchanged appearance of left upper lobe mass with mediastinal extension and bulky mediastinal adenopathy. 3. Small left pleural effusion. Aortic Atherosclerosis (ICD10-I70.0). Electronically Signed   By: Deatra Robinson M.D.   On: 12/04/2022 01:54   DG Chest Port 1 View  Result Date: 12/03/2022 CLINICAL DATA:  Chest pain. EXAM: PORTABLE CHEST 1 VIEW COMPARISON:  12/02/2022. FINDINGS: Heart is enlarged and the mediastinal contour is within normal limits. Lung volumes are low and there is chronic elevation of the left diaphragm. A stable focal opacities noted in the left upper lobe, compatible with known mass. No effusion or pneumothorax. Cervical spinal fusion hardware is noted. No acute osseous abnormality. IMPRESSION: Stable opacity in the left upper lobe, compatible with known mass. No acute abnormality is seen. Electronically Signed   By: Thornell Sartorius M.D.   On: 12/03/2022 23:11   DG CHEST PORT 1 VIEW  Result Date: 12/01/2022 CLINICAL DATA:  Status post bronchoscopy with biopsy. EXAM: PORTABLE CHEST 1 VIEW COMPARISON:  November 30, 2022. FINDINGS: Stable cardiomediastinal silhouette. Left upper lobe lung mass is again noted. No definite pneumothorax or pleural effusion is noted status post bronchoscopy. Right lung is clear. Bony thorax is unremarkable. IMPRESSION: No definite pneumothorax or pleural effusion status post bronchoscopy and biopsy of left upper lobe lung mass. Electronically Signed   By: Lupita Raider M.D.   On: 12/01/2022 16:00   DG C-ARM BRONCHOSCOPY  Result Date: 12/01/2022 C-ARM BRONCHOSCOPY: Fluoroscopy was utilized by the  requesting physician.  No radiographic interpretation.   CT Angio Chest PE W  and/or Wo Contrast  Result Date: 11/30/2022 CLINICAL DATA:  High probability for PE with left-sided chest pain. EXAM: CT ANGIOGRAPHY CHEST WITH CONTRAST TECHNIQUE: Multidetector CT imaging of the chest was performed using the standard protocol during bolus administration of intravenous contrast. Multiplanar CT image reconstructions and MIPs were obtained to evaluate the vascular anatomy. RADIATION DOSE REDUCTION: This exam was performed according to the departmental dose-optimization program which includes automated exposure control, adjustment of the mA and/or kV according to patient size and/or use of iterative reconstruction technique. CONTRAST:  75mL OMNIPAQUE IOHEXOL 350 MG/ML SOLN COMPARISON:  CT PE protocol 11/26/2014 FINDINGS: Cardiovascular: Satisfactory opacification of the pulmonary arteries to the segmental level. No evidence of pulmonary embolism. Normal heart size. No pericardial effusion. There are atherosclerotic calcifications of the aorta. Mediastinum/Nodes: There is bulky prevascular lymphadenopathy measuring 5.9 x 3.1 cm. There are multiple enlarged left hilar lymph nodes the largest measuring 1.9 by 2.3 cm. There are enlarged lymph nodes in the lower left neck with the largest lymph node measuring 2.2 by 4.3 cm lateral to the thyroid gland. Confluence soft tissue is seen abutting in between the left common carotid and left subclavian artery measuring 2.0 x 4.0 cm image 4/19. Visualized thyroid gland and esophagus are within normal limits. Lungs/Pleura: There is a left upper lobe mass measuring 8.0 x 6.0 x 7.1 cm. This abuts the mediastinum and right hilum. Central area of low attenuation and air likely related to necrosis. There is a right upper lobe nodule measuring 8 mm image 6/60. There is no pleural effusion or pneumothorax. There is cutoff of the left upper lobe bronchus. Upper Abdomen: No acute abnormality. Musculoskeletal: Lytic lesion with soft tissue component in the region of the  right superior sternum is present with soft tissue component extending into the anterior mediastinum. Anterior mediastinal soft tissue component measures 6.2 x 2.2 x 3.8 cm. Cervical spinal fusion plate is present. Review of the MIP images confirms the above findings. IMPRESSION: 1. No evidence for pulmonary embolism. 2. Left upper lobe mass with central necrosis worrisome for primary bronchogenic carcinoma. 3. Bulky mediastinal, left hilar and left lower neck lymphadenopathy worrisome for metastatic disease. 4. Lytic lesion in the right superior sternum with soft tissue component extending into the anterior mediastinum worrisome for metastatic disease. 5. 8 mm right upper lobe pulmonary nodule worrisome for metastatic disease. Aortic Atherosclerosis (ICD10-I70.0). Electronically Signed   By: Darliss Cheney M.D.   On: 11/30/2022 22:47   DG Cervical Spine 2 or 3 views  Result Date: 11/30/2022 CLINICAL DATA:  Neck pain without recent injury. EXAM: CERVICAL SPINE - 2-3 VIEW COMPARISON:  December 08, 2003. FINDINGS: Status post surgical anterior fusion of C5-6 and C6-7. No acute fracture or spondylolisthesis is noted. Large anterior osteophyte formation is noted at C4-5. No prevertebral soft tissue swelling is noted. IMPRESSION: Postsurgical and degenerative changes as described above. No acute abnormality seen. Electronically Signed   By: Lupita Raider M.D.   On: 11/30/2022 16:14   DG Chest 2 View  Result Date: 11/30/2022 CLINICAL DATA:  Chest pain. EXAM: CHEST - 2 VIEW COMPARISON:  March 19, 2021. FINDINGS: Stable cardiomediastinal silhouette. Large lobulated left upper lobe mass is noted concerning for malignancy. Right lung is clear. Bony thorax is unremarkable. IMPRESSION: Left upper lobe density is noted most consistent with left upper lobe mass or malignancy with associated atelectasis. CT scan of the chest with intravenous  contrast is recommended for further evaluation. Electronically Signed   By: Lupita Raider M.D.   On: 11/30/2022 16:11      IMPRESSION/PLAN: 1. 62 y.o. 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 Most recent imaging shows left upper lobe mass with lymphadenopathy compressing the superior vena cava. He is experiencing significant arm swelling and pain from disease compressing his SVC. His case was discussed at our tumor board and it was agreed that he would be a good candidate for concurrent chemoradiation.   Patient is currently scheduled for PET and brain MRI later this week along with a consult with Dr. Lorelle Formosa on 12/22/22.   Today, we talked to the patient and family about the findings and workup thus far. We discussed the natural history of squamous cell carcinoma and general treatment, highlighting the role of radiotherapy in the management. We discussed the available radiation techniques, and focused on the details and logistics of delivery. We reviewed the anticipated acute and late sequelae associated with radiation in this setting. The patient was encouraged to ask questions that were answered to his satisfaction.  At the end of our conversation, the patient would like to proceed with concurrent chemoradiation. He is scheduled for CT simulation later today. Given that his tumor is causing symptoms, it would be beneficial to start radiation as soon as possible to prevent further progression of symptoms. We will plan to start his 6 week course of radiation tomorrow, 12/15/22.   Patient understands that without completing the PET and brain MRI, we lack a complete understanding of this disease. He is aware that our treatment plans and intent may change based on new findings from imaging. We look forward to participating in this patient's care.    I personally spent 60 minutes in this encounter including chart review, reviewing radiological studies, meeting face-to-face  with the patient, entering orders and completing documentation.     Joyice Faster, PA-C    Margaretmary Dys, MD  [facility]  Radiation Oncology Direct Dial: [phone]  Fax: [phone] [email or web address]  Skype  [email or web address]   This document serves as a record of services personally performed by Margaretmary Dys, MD and Joyice Faster, PA-C. It was created on their behalf by Mickie Bail, a trained medical scribe. The creation of this record is based on the scribe's personal observations and the provider's statements to them. This document has been checked and approved by the attending provider.

## 2022-12-14 NOTE — Transitions of Care (Post Inpatient/ED Visit) (Signed)
   12/14/2022  Name: Rodney Mahoney MRN: [id number] DOB: May 24, 1961  Today's TOC FU Call Status: Today's TOC FU Call Status:: Unsuccessful Call (2nd Attempt) Unsuccessful Call (2nd Attempt) Date: 12/14/22  Attempted to reach the patient regarding the most recent Inpatient/ED visit.  Follow Up Plan: Additional outreach attempts will be made to reach the patient to complete the Transitions of Care (Post Inpatient/ED visit) call.   Jodelle Gross, RN, BSN, CCM Care Management Coordinator [facility] Phone: [phone]/Fax: [phone]

## 2022-12-15 ENCOUNTER — Telehealth: Payer: Self-pay | Admitting: Pulmonary Disease

## 2022-12-15 ENCOUNTER — Telehealth: Payer: Self-pay

## 2022-12-15 ENCOUNTER — Telehealth: Payer: Self-pay | Admitting: Internal Medicine

## 2022-12-15 ENCOUNTER — Other Ambulatory Visit: Payer: Self-pay

## 2022-12-15 ENCOUNTER — Ambulatory Visit
Admission: RE | Admit: 2022-12-15 | Discharge: 2022-12-15 | Disposition: A | Discharge status: Home / Self Care | Payer: 59 | Source: Ambulatory Visit | Attending: Radiation Oncology | Admitting: Radiation Oncology

## 2022-12-15 DIAGNOSIS — I871 Compression of vein: Secondary | ICD-10-CM | POA: Diagnosis not present

## 2022-12-15 DIAGNOSIS — Z809 Family history of malignant neoplasm, unspecified: Secondary | ICD-10-CM | POA: Diagnosis not present

## 2022-12-15 DIAGNOSIS — R079 Chest pain, unspecified: Secondary | ICD-10-CM | POA: Diagnosis not present

## 2022-12-15 DIAGNOSIS — K209 Esophagitis, unspecified without bleeding: Secondary | ICD-10-CM | POA: Diagnosis not present

## 2022-12-15 DIAGNOSIS — Z5111 Encounter for antineoplastic chemotherapy: Secondary | ICD-10-CM | POA: Diagnosis present

## 2022-12-15 DIAGNOSIS — I10 Essential (primary) hypertension: Secondary | ICD-10-CM | POA: Diagnosis not present

## 2022-12-15 DIAGNOSIS — Z923 Personal history of irradiation: Secondary | ICD-10-CM | POA: Diagnosis not present

## 2022-12-15 DIAGNOSIS — Z510 Encounter for antineoplastic radiation therapy: Secondary | ICD-10-CM | POA: Diagnosis not present

## 2022-12-15 DIAGNOSIS — Z79630 Long term (current) use of alkylating agent: Secondary | ICD-10-CM | POA: Diagnosis not present

## 2022-12-15 DIAGNOSIS — Z79633 Long term (current) use of mitotic inhibitor: Secondary | ICD-10-CM | POA: Diagnosis not present

## 2022-12-15 DIAGNOSIS — F17210 Nicotine dependence, cigarettes, uncomplicated: Secondary | ICD-10-CM | POA: Diagnosis not present

## 2022-12-15 DIAGNOSIS — C3412 Malignant neoplasm of upper lobe, left bronchus or lung: Secondary | ICD-10-CM | POA: Diagnosis not present

## 2022-12-15 DIAGNOSIS — Z79620 Long term (current) use of immunosuppressive biologic: Secondary | ICD-10-CM | POA: Diagnosis not present

## 2022-12-15 DIAGNOSIS — Z5112 Encounter for antineoplastic immunotherapy: Secondary | ICD-10-CM | POA: Diagnosis not present

## 2022-12-15 LAB — RAD ONC ARIA SESSION SUMMARY
Course Elapsed Days: 0
Plan Fractions Treated to Date: 1
Plan Prescribed Dose Per Fraction: 3 Gy
Plan Total Fractions Prescribed: 10
Plan Total Prescribed Dose: 30 Gy
Reference Point Dosage Given to Date: 3 Gy
Reference Point Session Dosage Given: 3 Gy
Session Number: 1

## 2022-12-15 NOTE — Telephone Encounter (Signed)
Pt called in regard dapagliflozin propanediol (FARXIGA) 10 MG TABS tablet [   States that insurance will not cover med . Has not had med in the past 3days.  Wants a cll back in regard.

## 2022-12-15 NOTE — Telephone Encounter (Signed)
[facility] PET CT tech called to say this PT told him his PET was to be done in [place] , not at [facility] as sched right nw for tomorrow,   I called PT who was very upset saying it was to be sched in [place]. Please call ASAP to advise which location is correct. His # is [phone]. He has a Dr. Alfonzo Beers this afternoon at about 2:00 so sched call around that. Thanks.

## 2022-12-15 NOTE — Transitions of Care (Post Inpatient/ED Visit) (Signed)
   12/15/2022  Name: Rodney Mahoney MRN: [id number] DOB: 18-Nov-1960  Today's TOC FU Call Status: Today's TOC FU Call Status:: Unsuccessful Call (3rd Attempt) Unsuccessful Call (3rd Attempt) Date: 12/15/22  Attempted to reach the patient regarding the most recent Inpatient/ED visit.  Follow Up Plan: No further outreach attempts will be made at this time. We have been unable to contact the patient.  Jodelle Gross, RN, BSN, CCM Care Management Coordinator [facility]

## 2022-12-15 NOTE — Telephone Encounter (Signed)
PA submitted and samples left at front desk for patient.

## 2022-12-16 ENCOUNTER — Ambulatory Visit
Admission: RE | Admit: 2022-12-16 | Discharge: 2022-12-16 | Disposition: A | Discharge status: Home / Self Care | Payer: 59 | Source: Ambulatory Visit | Attending: Radiation Oncology | Admitting: Radiation Oncology

## 2022-12-16 ENCOUNTER — Other Ambulatory Visit: Payer: Self-pay

## 2022-12-16 ENCOUNTER — Ambulatory Visit
Admission: RE | Admit: 2022-12-16 | Discharge: 2022-12-16 | Disposition: A | Discharge status: Home / Self Care | Payer: 59 | Source: Ambulatory Visit | Attending: Pulmonary Disease | Admitting: Pulmonary Disease

## 2022-12-16 DIAGNOSIS — C3490 Malignant neoplasm of unspecified part of unspecified bronchus or lung: Secondary | ICD-10-CM | POA: Diagnosis not present

## 2022-12-16 DIAGNOSIS — Z5111 Encounter for antineoplastic chemotherapy: Secondary | ICD-10-CM | POA: Diagnosis not present

## 2022-12-16 DIAGNOSIS — Z79630 Long term (current) use of alkylating agent: Secondary | ICD-10-CM | POA: Diagnosis not present

## 2022-12-16 DIAGNOSIS — I871 Compression of vein: Secondary | ICD-10-CM | POA: Diagnosis not present

## 2022-12-16 DIAGNOSIS — Z809 Family history of malignant neoplasm, unspecified: Secondary | ICD-10-CM | POA: Diagnosis not present

## 2022-12-16 DIAGNOSIS — R599 Enlarged lymph nodes, unspecified: Secondary | ICD-10-CM | POA: Insufficient documentation

## 2022-12-16 DIAGNOSIS — R911 Solitary pulmonary nodule: Secondary | ICD-10-CM | POA: Diagnosis not present

## 2022-12-16 DIAGNOSIS — Z79620 Long term (current) use of immunosuppressive biologic: Secondary | ICD-10-CM | POA: Diagnosis not present

## 2022-12-16 DIAGNOSIS — C3412 Malignant neoplasm of upper lobe, left bronchus or lung: Secondary | ICD-10-CM | POA: Diagnosis not present

## 2022-12-16 DIAGNOSIS — Z79633 Long term (current) use of mitotic inhibitor: Secondary | ICD-10-CM | POA: Diagnosis not present

## 2022-12-16 DIAGNOSIS — R079 Chest pain, unspecified: Secondary | ICD-10-CM | POA: Diagnosis not present

## 2022-12-16 DIAGNOSIS — Z923 Personal history of irradiation: Secondary | ICD-10-CM | POA: Diagnosis not present

## 2022-12-16 DIAGNOSIS — F17210 Nicotine dependence, cigarettes, uncomplicated: Secondary | ICD-10-CM | POA: Diagnosis not present

## 2022-12-16 DIAGNOSIS — Z510 Encounter for antineoplastic radiation therapy: Secondary | ICD-10-CM | POA: Diagnosis not present

## 2022-12-16 DIAGNOSIS — Z5112 Encounter for antineoplastic immunotherapy: Secondary | ICD-10-CM | POA: Diagnosis not present

## 2022-12-16 DIAGNOSIS — K209 Esophagitis, unspecified without bleeding: Secondary | ICD-10-CM | POA: Diagnosis not present

## 2022-12-16 DIAGNOSIS — I10 Essential (primary) hypertension: Secondary | ICD-10-CM | POA: Diagnosis not present

## 2022-12-16 LAB — RAD ONC ARIA SESSION SUMMARY
Course Elapsed Days: 1
Plan Fractions Treated to Date: 2
Plan Prescribed Dose Per Fraction: 3 Gy
Plan Total Fractions Prescribed: 10
Plan Total Prescribed Dose: 30 Gy
Reference Point Dosage Given to Date: 6 Gy
Reference Point Session Dosage Given: 3 Gy
Session Number: 2

## 2022-12-16 LAB — GLUCOSE, CAPILLARY: Glucose-Capillary: 79 mg/dL (ref 70–99)

## 2022-12-16 MED ORDER — FLUDEOXYGLUCOSE F - 18 (FDG) INJECTION
9.7000 | Freq: Once | INTRAVENOUS | Status: AC | PRN
  Administered 2022-12-16: 10.29 via INTRAVENOUS

## 2022-12-16 MED ORDER — EMPAGLIFLOZIN 10 MG PO TABS: 10.0000 mg | ORAL_TABLET | Freq: Every day | ORAL | 3 refills | Status: DC

## 2022-12-16 NOTE — Telephone Encounter (Addendum)
Pt completed PET already at [facility].   I called CS to resch for this pt and this was not even needed.   Thanks

## 2022-12-18 ENCOUNTER — Ambulatory Visit
Admission: RE | Admit: 2022-12-18 | Discharge: 2022-12-18 | Disposition: A | Discharge status: Home / Self Care | Payer: 59 | Source: Ambulatory Visit | Attending: Radiation Oncology | Admitting: Radiation Oncology

## 2022-12-18 ENCOUNTER — Other Ambulatory Visit: Payer: Self-pay

## 2022-12-18 ENCOUNTER — Telehealth: Payer: Self-pay | Admitting: Cardiology

## 2022-12-18 ENCOUNTER — Ambulatory Visit (HOSPITAL_BASED_OUTPATIENT_CLINIC_OR_DEPARTMENT_OTHER): Payer: 59

## 2022-12-18 DIAGNOSIS — Z923 Personal history of irradiation: Secondary | ICD-10-CM | POA: Diagnosis not present

## 2022-12-18 DIAGNOSIS — I2510 Atherosclerotic heart disease of native coronary artery without angina pectoris: Secondary | ICD-10-CM

## 2022-12-18 DIAGNOSIS — Z79633 Long term (current) use of mitotic inhibitor: Secondary | ICD-10-CM | POA: Diagnosis not present

## 2022-12-18 DIAGNOSIS — I10 Essential (primary) hypertension: Secondary | ICD-10-CM | POA: Diagnosis not present

## 2022-12-18 DIAGNOSIS — Z809 Family history of malignant neoplasm, unspecified: Secondary | ICD-10-CM | POA: Diagnosis not present

## 2022-12-18 DIAGNOSIS — R079 Chest pain, unspecified: Secondary | ICD-10-CM | POA: Diagnosis not present

## 2022-12-18 DIAGNOSIS — Z5111 Encounter for antineoplastic chemotherapy: Secondary | ICD-10-CM | POA: Diagnosis not present

## 2022-12-18 DIAGNOSIS — C3412 Malignant neoplasm of upper lobe, left bronchus or lung: Secondary | ICD-10-CM

## 2022-12-18 DIAGNOSIS — Z79620 Long term (current) use of immunosuppressive biologic: Secondary | ICD-10-CM | POA: Diagnosis not present

## 2022-12-18 DIAGNOSIS — F17210 Nicotine dependence, cigarettes, uncomplicated: Secondary | ICD-10-CM | POA: Diagnosis not present

## 2022-12-18 DIAGNOSIS — I5021 Acute systolic (congestive) heart failure: Secondary | ICD-10-CM

## 2022-12-18 DIAGNOSIS — Z5112 Encounter for antineoplastic immunotherapy: Secondary | ICD-10-CM | POA: Diagnosis not present

## 2022-12-18 DIAGNOSIS — Z79630 Long term (current) use of alkylating agent: Secondary | ICD-10-CM | POA: Diagnosis not present

## 2022-12-18 DIAGNOSIS — K209 Esophagitis, unspecified without bleeding: Secondary | ICD-10-CM | POA: Diagnosis not present

## 2022-12-18 DIAGNOSIS — I871 Compression of vein: Secondary | ICD-10-CM | POA: Diagnosis not present

## 2022-12-18 DIAGNOSIS — Z510 Encounter for antineoplastic radiation therapy: Secondary | ICD-10-CM | POA: Diagnosis not present

## 2022-12-18 LAB — RAD ONC ARIA SESSION SUMMARY
Course Elapsed Days: 3
Plan Fractions Treated to Date: 3
Plan Prescribed Dose Per Fraction: 3 Gy
Plan Total Fractions Prescribed: 10
Plan Total Prescribed Dose: 30 Gy
Reference Point Dosage Given to Date: 9 Gy
Reference Point Session Dosage Given: 3 Gy
Session Number: 3

## 2022-12-18 MED ORDER — DAPAGLIFLOZIN PROPANEDIOL 10 MG PO TABS
10.0000 mg | ORAL_TABLET | Freq: Every day | ORAL | 3 refills | Status: DC
Start: 2022-12-18 — End: 2023-01-21

## 2022-12-18 MED ORDER — SONAFINE EX EMUL
1.0000 | Freq: Two times a day (BID) | CUTANEOUS | Status: DC
  Administered 2022-12-18: 1 via TOPICAL

## 2022-12-18 NOTE — Telephone Encounter (Signed)
Rx refilled as requested.  

## 2022-12-18 NOTE — Telephone Encounter (Signed)
*  STAT* If patient is at the pharmacy, call can be transferred to refill team.   1. Which medications need to be refilled? (please list name of each medication and dose if known)  new Prescription for Farxiga  2. Which pharmacy/location (including street and city if local pharmacy) is medication to be sent to? [facility]  3. Do they need a 30 day or 90 day supply? 90 days and refills

## 2022-12-21 ENCOUNTER — Other Ambulatory Visit: Payer: Self-pay

## 2022-12-21 ENCOUNTER — Ambulatory Visit
Admission: RE | Admit: 2022-12-21 | Discharge: 2022-12-21 | Disposition: A | Discharge status: Home / Self Care | Payer: 59 | Source: Ambulatory Visit | Attending: Radiation Oncology | Admitting: Radiation Oncology

## 2022-12-21 DIAGNOSIS — I871 Compression of vein: Secondary | ICD-10-CM | POA: Diagnosis not present

## 2022-12-21 DIAGNOSIS — Z79630 Long term (current) use of alkylating agent: Secondary | ICD-10-CM | POA: Diagnosis not present

## 2022-12-21 DIAGNOSIS — I10 Essential (primary) hypertension: Secondary | ICD-10-CM | POA: Diagnosis not present

## 2022-12-21 DIAGNOSIS — Z5111 Encounter for antineoplastic chemotherapy: Secondary | ICD-10-CM | POA: Diagnosis not present

## 2022-12-21 DIAGNOSIS — Z809 Family history of malignant neoplasm, unspecified: Secondary | ICD-10-CM | POA: Diagnosis not present

## 2022-12-21 DIAGNOSIS — F17210 Nicotine dependence, cigarettes, uncomplicated: Secondary | ICD-10-CM | POA: Diagnosis not present

## 2022-12-21 DIAGNOSIS — Z510 Encounter for antineoplastic radiation therapy: Secondary | ICD-10-CM | POA: Diagnosis not present

## 2022-12-21 DIAGNOSIS — Z5112 Encounter for antineoplastic immunotherapy: Secondary | ICD-10-CM | POA: Diagnosis not present

## 2022-12-21 DIAGNOSIS — Z79620 Long term (current) use of immunosuppressive biologic: Secondary | ICD-10-CM | POA: Diagnosis not present

## 2022-12-21 DIAGNOSIS — R079 Chest pain, unspecified: Secondary | ICD-10-CM | POA: Diagnosis not present

## 2022-12-21 DIAGNOSIS — C3412 Malignant neoplasm of upper lobe, left bronchus or lung: Secondary | ICD-10-CM | POA: Diagnosis not present

## 2022-12-21 DIAGNOSIS — Z79633 Long term (current) use of mitotic inhibitor: Secondary | ICD-10-CM | POA: Diagnosis not present

## 2022-12-21 DIAGNOSIS — K209 Esophagitis, unspecified without bleeding: Secondary | ICD-10-CM | POA: Diagnosis not present

## 2022-12-21 DIAGNOSIS — Z923 Personal history of irradiation: Secondary | ICD-10-CM | POA: Diagnosis not present

## 2022-12-21 LAB — RAD ONC ARIA SESSION SUMMARY
Course Elapsed Days: 6
Plan Fractions Treated to Date: 4
Plan Prescribed Dose Per Fraction: 3 Gy
Plan Total Fractions Prescribed: 10
Plan Total Prescribed Dose: 30 Gy
Reference Point Dosage Given to Date: 12 Gy
Reference Point Session Dosage Given: 3 Gy
Session Number: 4

## 2022-12-22 ENCOUNTER — Ambulatory Visit
Admission: RE | Admit: 2022-12-22 | Discharge: 2022-12-22 | Disposition: A | Discharge status: Home / Self Care | Payer: 59 | Source: Ambulatory Visit | Attending: Radiation Oncology | Admitting: Radiation Oncology

## 2022-12-22 ENCOUNTER — Other Ambulatory Visit: Payer: Self-pay

## 2022-12-22 ENCOUNTER — Inpatient Hospital Stay: Payer: 59 | Attending: Hematology | Admitting: Hematology

## 2022-12-22 ENCOUNTER — Encounter: Payer: Self-pay | Admitting: Hematology

## 2022-12-22 VITALS — BP 107/63 | HR 54 | Temp 97.2°F | Resp 16 | Ht 66.0 in | Wt 181.0 lb

## 2022-12-22 DIAGNOSIS — I10 Essential (primary) hypertension: Secondary | ICD-10-CM | POA: Diagnosis not present

## 2022-12-22 DIAGNOSIS — Z79620 Long term (current) use of immunosuppressive biologic: Secondary | ICD-10-CM | POA: Insufficient documentation

## 2022-12-22 DIAGNOSIS — C3412 Malignant neoplasm of upper lobe, left bronchus or lung: Secondary | ICD-10-CM | POA: Diagnosis not present

## 2022-12-22 DIAGNOSIS — Z79633 Long term (current) use of mitotic inhibitor: Secondary | ICD-10-CM | POA: Diagnosis not present

## 2022-12-22 DIAGNOSIS — C3492 Malignant neoplasm of unspecified part of left bronchus or lung: Secondary | ICD-10-CM

## 2022-12-22 DIAGNOSIS — Z79630 Long term (current) use of alkylating agent: Secondary | ICD-10-CM | POA: Insufficient documentation

## 2022-12-22 DIAGNOSIS — Z5111 Encounter for antineoplastic chemotherapy: Secondary | ICD-10-CM | POA: Diagnosis not present

## 2022-12-22 DIAGNOSIS — Z923 Personal history of irradiation: Secondary | ICD-10-CM | POA: Insufficient documentation

## 2022-12-22 DIAGNOSIS — Z809 Family history of malignant neoplasm, unspecified: Secondary | ICD-10-CM | POA: Diagnosis not present

## 2022-12-22 DIAGNOSIS — I871 Compression of vein: Secondary | ICD-10-CM | POA: Insufficient documentation

## 2022-12-22 DIAGNOSIS — R079 Chest pain, unspecified: Secondary | ICD-10-CM | POA: Insufficient documentation

## 2022-12-22 DIAGNOSIS — Z510 Encounter for antineoplastic radiation therapy: Secondary | ICD-10-CM | POA: Insufficient documentation

## 2022-12-22 DIAGNOSIS — F17210 Nicotine dependence, cigarettes, uncomplicated: Secondary | ICD-10-CM | POA: Diagnosis not present

## 2022-12-22 DIAGNOSIS — Z5112 Encounter for antineoplastic immunotherapy: Secondary | ICD-10-CM | POA: Diagnosis not present

## 2022-12-22 DIAGNOSIS — K209 Esophagitis, unspecified without bleeding: Secondary | ICD-10-CM | POA: Diagnosis not present

## 2022-12-22 LAB — RAD ONC ARIA SESSION SUMMARY
Course Elapsed Days: 7
Plan Fractions Treated to Date: 1
Plan Prescribed Dose Per Fraction: 3 Gy
Plan Total Fractions Prescribed: 6
Plan Total Prescribed Dose: 18 Gy
Reference Point Dosage Given to Date: 15 Gy
Reference Point Session Dosage Given: 3 Gy
Session Number: 5

## 2022-12-22 MED ORDER — HYDROCODONE-ACETAMINOPHEN 7.5-325 MG PO TABS
1.0000 | ORAL_TABLET | Freq: Four times a day (QID) | ORAL | 0 refills | Status: DC | PRN
Start: 2022-12-22 — End: 2024-01-04

## 2022-12-22 NOTE — Patient Instructions (Addendum)
[facility]  Discharge Instructions  You were seen and examined today by Dr. Ellin Saba. Dr. Ellin Saba is a medical oncologist, meaning that he specializes in the treatment of cancer diagnoses. Dr. Ellin Saba discussed your past medical history, family history of cancers, and the events that led to you being here today.  You were referred to Dr. Ellin Saba due to a new diagnosis of lung cancer.  You have been diagnosed with Stage IV Lung Cancer. The cancer started in the left lung and spread to the right lung, lymph nodes and is invading the breast bone. Unfortunately, it is not likely that this cancer will be cured.  Dr. Ellin Saba has recommended having a Port-A-Cath placed. Dr. Ellin Saba also recommends sending additional testing on the tissue that was removed during the biopsy.  Stop taking Metoprolol and Lisinopril for now.  We will arrange for you to have chemotherapy here at the [facility] weekly.  Follow-up as scheduled.  Thank you for choosing [facility] to provide your oncology and hematology care.   To afford each patient quality time with our provider, please arrive at least 15 minutes before your scheduled appointment time. You may need to reschedule your appointment if you arrive late (10 or more minutes). Arriving late affects you and other patients whose appointments are after yours.  Also, if you miss three or more appointments without notifying the office, you may be dismissed from the clinic at the provider's discretion.    Again, thank you for choosing [facility].  Our hope is that these requests will decrease the amount of time that you wait before being seen by our physicians.   If you have a lab appointment with the Cancer Center - please note that after April 8th, all labs will be drawn in the cancer center.  You do not have to check in or register with the main entrance as you have in the past  but will complete your check-in at the cancer center.            _____________________________________________________________  Should you have questions after your visit to [facility], please contact our office at [phone] and follow the prompts.  Our office hours are 8:00 a.m. to 4:30 p.m. Monday - Thursday and 8:00 a.m. to 2:30 p.m. Friday.  Please note that voicemails left after 4:00 p.m. may not be returned until the following business day.  We are closed weekends and all major holidays.  You do have access to a nurse 24-7, just call the main number to the clinic [phone] and do not press any options, hold on the line and a nurse will answer the phone.    For prescription refill requests, have your pharmacy contact our office and allow 72 hours.    Masks are no longer required in the cancer centers. If you would like for your care team to wear a mask while they are taking care of you, please let them know. You may have one support person who is at least 62 years old accompany you for your appointments.

## 2022-12-22 NOTE — Patient Instructions (Addendum)
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 You will be treated weekly in the clinic in conjunction with radiation therapy. You will receive two chemotherapy drugs in the clinic weekly. Those drugs are paclitaxel (Taxol) and carboplatin. The intent of treatment is to shrink and control the cancer, prevent it from spreading further, and to alleviate any symptoms you may have related to the cancer. You will see the doctor regularly throughout treatment.  We will obtain blood work from you prior to every treatment and monitor your results to make sure it is safe to give your treatment. The doctor monitors your response to treatment by the way you are feeling, your blood work, and by obtaining scans periodically. There will be wait times while you are here for treatment.  It will take about 30 minutes to 1 hour for your lab work to result. Then there will be wait times while pharmacy mixes your medications.    Medications you will receive in the clinic prior to your chemotherapy medications:   Aloxi:  ALOXI is used in adults to help prevent the nausea and vomiting that happens with certain chemotherapy drugs.  Aloxi is a long acting medication, and will remain in your system for about 2 days.    Dexamethasone:  This is a steroid given prior to chemotherapy to help prevent allergic reactions; it may also help prevent and control nausea and diarrhea.    Pepcid:  This medication is a histamine blocker that helps prevent and allergic reaction to your chemotherapy.    Benadryl:  This is a histamine blocker (different from the Pepcid) that helps prevent allergic/infusion reactions to your chemotherapy. This medication may cause dizziness/drowsiness.     Paclitaxel (Taxol)  About This Drug Paclitaxel is a drug used to treat cancer. It is given in the vein (IV).  This will take 1 hour to infuse.  This first infusion  will take longer to infuse because it is increased slowly to monitor for reactions.  The nurse will be in the room with you for the first 15 minutes of the first infusion.  Possible Side Effects   Hair loss. Hair loss is often temporary, although with certain medicine, hair loss can sometimes be permanent. Hair loss may happen suddenly or gradually. If you lose hair, you may lose it from your head, face, armpits, pubic area, chest, and/or legs. You may also notice your hair getting thin.   Swelling of your legs, ankles and/or feet (edema)   Flushing   Nausea and throwing up (vomiting)   Loose bowel movements (diarrhea)   Bone marrow depression. This is a decrease in the number of white blood cells, red blood cells, and platelets. This may raise your risk of infection, make you tired and weak (fatigue), and raise your risk of bleeding.   Effects on the nerves are called peripheral neuropathy. You may feel numbness, tingling, or pain in your hands and feet. It may be hard for you to button your clothes, open jars, or walk as usual. The effect on the nerves may get worse with more doses of the drug. These effects get better in some people after the drug is stopped but it does not get better in all people.   Changes in your liver function   Bone, joint and muscle pain   Abnormal EKG   Allergic reaction: Allergic reactions, including anaphylaxis are rare but may  happen in some patients. Signs of allergic reaction to this drug may be swelling of the face, feeling like your tongue or throat are swelling, trouble breathing, rash, itching, fever, chills, feeling dizzy, and/or feeling that your heart is beating in a fast or not normal way. If this happens, do not take another dose of this drug. You should get urgent medical treatment.   Infection   Changes in your kidney function.  Note: Each of the side effects above was reported in 20% or greater of patients treated with paclitaxel. Not all  possible side effects are included above.  Warnings and Precautions   Severe allergic reactions   Severe bone marrow depression  Treating Side Effects   To help with hair loss, wash with a mild shampoo and avoid washing your hair every day.   Avoid rubbing your scalp, instead, pat your hair or scalp dry   Avoid coloring your hair   Limit your use of hair spray, electric curlers, blow dryers, and curling irons.   If you are interested in getting a wig, talk to your nurse. You can also call the [employer] at 800-ACS-2345 to find out information about the "Look Good, Feel Better" program close to where you live. It is a free program where women getting chemotherapy can learn about wigs, turbans and scarves as well as makeup techniques and skin and nail care.   Ask your doctor or nurse about medicines that are available to help stop or lessen diarrhea and/or nausea.   To help with nausea and vomiting, eat small, frequent meals instead of three large meals a day. Choose foods and drinks that are at room temperature. Ask your nurse or doctor about other helpful tips and medicine that is available to help or stop lessen these symptoms.   If you get diarrhea, eat low-fiber foods that are high in protein and calories and avoid foods that can irritate your digestive tracts or lead to cramping. Ask your nurse or doctor about medicine that can lessen or stop your diarrhea.   Mouth care is very important. Your mouth care should consist of routine, gentle cleaning of your teeth or dentures and rinsing your mouth with a mixture of 1/2 teaspoon of salt in 8 ounces of water or  teaspoon of baking soda in 8 ounces of water. This should be done at least after each meal and at bedtime.   If you have mouth sores, avoid mouthwash that has alcohol. Also avoid alcohol and smoking because they can bother your mouth and throat.   Drink plenty of fluids (a minimum of eight glasses per day is  recommended).   Take your temperature as your doctor or nurse tells you, and whenever you feel like you may have a fever.   Talk to your doctor or nurse about precautions you can take to avoid infections and bleeding.   Be careful when cooking, walking, and handling sharp objects and hot liquids.  Food and Drug Interactions   There are no known interactions of paclitaxel with food.   This drug may interact with other medicines. Tell your doctor and pharmacist about all the medicines and dietary supplements (vitamins, minerals, herbs and others) that you are taking at this time.   The safety and use of dietary supplements and alternative diets are often not known. Using these might affect your cancer or interfere with your treatment. Until more is known, you should not use dietary supplements or alternative diets without your cancer  doctor's help.  When to Call the Doctor  Call your doctor or nurse if you have any of the following symptoms and/or any new or unusual symptoms:   Fever of 100.4 F (38 C) or above   Chills   Redness, pain, warmth, or swelling at the IV site during the infusion   Signs of allergic reaction: swelling of the face, feeling like your tongue or throat are swelling, trouble breathing, rash, itching, fever, chills, feeling dizzy, and/or feeling that your heart is beating in a fast or not normal way   Feeling that your heart is beating in a fast or not normal way (palpitations)   Weight gain of 5 pounds in one week (fluid retention)   Decreased urine or very dark urine   Signs of liver problems: dark urine, pale bowel movements, bad stomach pain, feeling very tired and weak, unusual  itching, or yellowing of the eyes or skin   Heavy menstrual period that lasts longer than normal   Easy bruising or bleeding   Nausea that stops you from eating or drinking, and/or that is not relieved by prescribed medicines.   Loose bowel movements (diarrhea) more than 4  times a day or diarrhea with weakness or lightheadedness   Pain in your mouth or throat that makes it hard to eat or drink   Lasting loss of appetite or rapid weight loss of five pounds in a week   Signs of peripheral neuropathy: numbness, tingling, or decreased feeling in fingers or toes; trouble walking or changes in the way you walk; or feeling clumsy when buttoning clothes, opening jars, or other routine activities   Joint and muscle pain that is not relieved by prescribed medicines   Extreme fatigue that interferes with normal activities   While you are getting this drug, please tell your nurse right away if you have any pain, redness, or swelling at the site of the IV infusion.   If you think you are pregnant.  Reproduction Warnings   Pregnancy warning: This drug may have harmful effects on the unborn child, it is recommended that effective methods of birth control should be used during your cancer treatment. Let your doctor know right away if you think you may be pregnant.   Breast feeding warning: Women should not breast feed during treatment because this drug could enter the breastmilk and cause harm to a breast feeding baby.   Carboplatin (Paraplatin, CBDCA)  About This Drug  Carboplatin is used to treat cancer. It is given in the vein (IV).  It will take 30 minutes to infuse.   Possible Side Effects   Bone marrow suppression. This is a decrease in the number of white blood cells, red blood cells, and platelets. This may raise your risk of infection, make you tired and weak (fatigue), and raise your risk of bleeding.   Nausea and vomiting (throwing up)   Weakness   Changes in your liver function   Changes in your kidney function   Electrolyte changes   Pain  Note: Each of the side effects above was reported in 20% or greater of patients treated with carboplatin. Not all possible side effects are included above.   Warnings and Precautions   Severe bone  marrow suppression   Allergic reactions, including anaphylaxis are rare but may happen in some patients. Signs of allergic reaction to this drug may be swelling of the face, feeling like your tongue or throat are swelling, trouble breathing, rash, itching, fever, chills,  feeling dizzy, and/or feeling that your heart is beating in a fast or not normal way. If this happens, do not take another dose of this drug. You should get urgent medical treatment.   Severe nausea and vomiting   Effects on the nerves are called peripheral neuropathy. This risk is increased if you are over the age of 53 or if you have received other medicine with risk of peripheral neuropathy. You may feel numbness, tingling, or pain in your hands and feet. It may be hard for you to button your clothes, open jars, or walk as usual. The effect on the nerves may get worse with more doses of the drug. These effects get better in some people after the drug is stopped but it does not get better in all people.   Blurred vision, loss of vision or other changes in eyesight   Decreased hearing   - Skin and tissue irritation including redness, pain, warmth, or swelling at the IV site if the drug leaks out of the vein and into nearby tissue.   Severe changes in your kidney function, which can cause kidney failure   Severe changes in your liver function, which can cause liver failure  Note: Some of the side effects above are very rare. If you have concerns and/or questions, please discuss them with your medical team.   Important Information   This drug may be present in the saliva, tears, sweat, urine, stool, vomit, semen, and vaginal secretions. Talk to your doctor and/or your nurse about the necessary precautions to take during this time.   Treating Side Effects   Manage tiredness by pacing your activities for the day.   Be sure to include periods of rest between energy-draining activities.   To decrease the risk of infection,  wash your hands regularly.   Avoid close contact with people who have a cold, the flu, or other infections.   Take your temperature as your doctor or nurse tells you, and whenever you feel like you may have a fever.   To help decrease the risk of bleeding, use a soft toothbrush. Check with your nurse before using dental floss.   Be very careful when using knives or tools.   Use an electric shaver instead of a razor.   Drink plenty of fluids (a minimum of eight glasses per day is recommended).   If you throw up or have loose bowel movements, you should drink more fluids so that you do not become dehydrated (lack of water in the body from losing too much fluid).   To help with nausea and vomiting, eat small, frequent meals instead of three large meals a day. Choose foods and drinks that are at room temperature. Ask your nurse or doctor about other helpful tips and medicine that is available to help stop or lessen these symptoms.   If you have numbness and tingling in your hands and feet, be careful when cooking, walking, and handling sharp objects and hot liquids.   Keeping your pain under control is important to your well-being. Please tell your doctor or nurse if you are experiencing pain.   Food and Drug Interactions   There are no known interactions of carboplatin with food.   This drug may interact with other medicines. Tell your doctor and pharmacist about all the prescription and over-the-counter medicines and dietary supplements (vitamins, minerals, herbs and others) that you are taking at this time. Also, check with your doctor or pharmacist before starting any new  prescription or over-the-counter medicines, or dietary supplements to make sure that there are no interactions.   When to Call the Doctor  Call your doctor or nurse if you have any of these symptoms and/or any new or unusual symptoms:   Fever of 100.4 F (38 C) or higher   Chills   Tiredness that interferes  with your daily activities   Feeling dizzy or lightheaded   Easy bleeding or bruising   Nausea that stops you from eating or drinking and/or is not relieved by prescribed medicines   Throwing up   Blurred vision or other changes in eyesight   Decrease in hearing or ringing in the ear   Signs of allergic reaction: swelling of the face, feeling like your tongue or throat are swelling, trouble breathing, rash, itching, fever, chills, feeling dizzy, and/or feeling that your heart is beating in a fast or not normal way. If this happens, call 911 for emergency care.   Signs of possible liver problems: dark urine, pale bowel movements, bad stomach pain, feeling very tired and weak, unusual itching, or yellowing of the eyes or skin   Decreased urine, or very dark urine   Numbness, tingling, or pain in your hands and feet   Pain that does not go away or is not relieved by prescribed medicine   While you are getting this drug, please tell your nurse right away if you have any pain, redness, or swelling at the site of the IV infusion, or if you have any new onset of symptoms, or if you just feel "different" from before when the infusion was started.   Reproduction Warnings   Pregnancy warning: This drug may have harmful effects on the unborn baby. Women of child bearing potential should use effective methods of birth control during your cancer treatment. Let your doctor know right away if you think you may be pregnant.   Breastfeeding warning: It is not known if this drug passes into breast milk. For this reason, women should not breastfeed during treatment because this drug could enter the breast milk and cause harm to a breastfeeding baby.   Fertility warning: Human fertility studies have not been done with this drug. Talk with your doctor or nurse if you plan to have children. Ask for information on sperm or egg banking.   SELF CARE ACTIVITIES WHILE ON  CHEMOTHERAPY/IMMUNOTHERAPY:  Hydration Increase your fluid intake and drink at least 64 ounces (2 liters) of water/decaffeinated beverages per day after treatment. You can still have your cup of coffee or soda but these beverages do not count as part of the 64 ounces that you need to drink daily. Limit alcohol intake.  Medications Continue taking your normal prescription medication as prescribed.  If you start any new herbal or new supplements please let us know first to make sure it is safe.  Mouth Care Have teeth cleaned professionally before starting treatment. Keep dentures and partial plates clean. Use soft toothbrush and do not use mouthwashes that contain alcohol. Biotene is a good mouthwash that is available at most pharmacies or may be ordered by calling [phone]. Use warm salt water gargles (1 teaspoon salt per 1 quart warm water) before and after meals and at bedtime. If you are still having problems with your mouth or sores in your mouth please call the clinic. If you need dental work, please let the doctor know before you go for your appointment so that we can coordinate the best possible time for  you in regards to your chemo regimen. You need to also let your dentist know that you are actively taking chemo. We may need to do labs prior to your dental appointment.  Skin Care Always use sunscreen that has not expired and with SPF (Sun Protection Factor) of 50 or higher. Wear hats to protect your head from the sun. Remember to use sunscreen on your hands, ears, face, & feet.  Use good moisturizing lotions such as udder cream, eucerin, or even Vaseline. Some chemotherapies can cause dry skin, color changes in your skin and nails.    Avoid long, hot showers or baths. Use gentle, fragrance-free soaps and laundry detergent. Use moisturizers, preferably creams or ointments rather than lotions because the thicker consistency is better at preventing skin dehydration. Apply the cream or  ointment within 15 minutes of showering. Reapply moisturizer at night, and moisturize your hands every time after you wash them.   Infection Prevention Please wash your hands for at least 30 seconds using warm soapy water. Handwashing is the #1 way to prevent the spread of germs. Stay away from sick people or people who are getting over a cold. If you develop respiratory systems such as green/yellow mucus production or productive cough or persistent cough let us know and we will see if you need an antibiotic. It is a good idea to keep a pair of gloves on when going into grocery stores/[employer] to decrease your risk of coming into contact with germs on the carts, etc. Carry alcohol hand gel with you at all times and use it frequently if out in public. If your temperature reaches 100.5 or higher please call the clinic and let us know.  If it is after hours or on the weekend please go to the ER if your temperature is over 100.4.  Please have your own personal thermometer at home to use.    Sex and bodily fluids If you are going to have sex, a condom must be used to protect the person that isn't taking immunotherapy. For a few days after treatment, immunotherapy can be excreted through your bodily fluids.  When using the toilet please close the lid and flush the toilet twice.  Do this for a few day after you have had immunotherapy.   Contraception It is not known for sure whether or not immunotherapy drugs can be passed on through semen or secretions from the vagina. Because of this some doctors advise people to use a barrier method if you have sex during treatment. This applies to vaginal, anal or oral sex.  Generally, doctors advise a barrier method only for the time you are actually having the treatment and for about a week after your treatment.  Advice like this can be worrying, but this does not mean that you have to avoid being intimate with your partner. You can still have close contact with your  partner and continue to enjoy sex.  Animals If you have cats or birds we ask that you not change the litter or change the cage.  Please have someone else do this for you while you are on immunotherapy.   Food Safety During and After Cancer Treatment Food safety is important for people both during and after cancer treatment. Cancer and cancer treatments, such as chemotherapy, radiation therapy, and stem cell/bone marrow transplantation, often weaken the immune system. This makes it harder for your body to protect itself from foodborne illness, also called food poisoning. Foodborne illness is caused by eating food  that contains harmful bacteria, parasites, or viruses.  Foods to avoid Some foods have a higher risk of becoming tainted with bacteria. These include: Unwashed fresh fruit and vegetables, especially leafy vegetables that can hide dirt and other contaminants Raw sprouts, such as alfalfa sprouts Raw or undercooked beef, especially ground beef, or other raw or undercooked meat and poultry Fatty, fried, or spicy foods immediately before or after treatment.  These can sit heavy on your stomach and make you feel nauseous. Raw or undercooked shellfish, such as oysters. Sushi and sashimi, which often contain raw fish.  Unpasteurized beverages, such as unpasteurized fruit juices, raw milk, raw yogurt, or cider Undercooked eggs, such as soft boiled, over easy, and poached; raw, unpasteurized eggs; or foods made with raw egg, such as homemade raw cookie dough and homemade mayonnaise  Simple steps for food safety  Shop smart. Do not buy food stored or displayed in an unclean area. Do not buy bruised or damaged fruits or vegetables. Do not buy cans that have cracks, dents, or bulges. Pick up foods that can spoil at the end of your shopping trip and store them in a cooler on the way home.  Prepare and clean up foods carefully. Rinse all fresh fruits and vegetables under running water, and dry  them with a clean towel or paper towel. Clean the top of cans before opening them. After preparing food, wash your hands for 20 seconds with hot water and soap. Pay special attention to areas between fingers and under nails. Clean your utensils and dishes with hot water and soap. Disinfect your kitchen and cutting boards using 1 teaspoon of liquid, unscented bleach mixed into 1 quart of water.    Dispose of old food. Eat canned and packaged food before its expiration date (the "use by" or "best before" date). Consume refrigerated leftovers within 3 to 4 days. After that time, throw out the food. Even if the food does not smell or look spoiled, it still may be unsafe. Some bacteria, such as Listeria, can grow even on foods stored in the refrigerator if they are kept for too long.  Take precautions when eating out. At restaurants, avoid buffets and salad bars where food sits out for a long time and comes in contact with many people. Food can become contaminated when someone with a virus, often a norovirus, or another "bug" handles it. Put any leftover food in a "to-go" container yourself, rather than having the server do it. And, refrigerate leftovers as soon as you get home. Choose restaurants that are clean and that are willing to prepare your food as you order it cooked.    SYMPTOMS TO REPORT AS SOON AS POSSIBLE AFTER TREATMENT:  FEVER GREATER THAN 100.4 F CHILLS WITH OR WITHOUT FEVER NAUSEA AND VOMITING THAT IS NOT CONTROLLED WITH YOUR NAUSEA MEDICATION UNUSUAL SHORTNESS OF BREATH UNUSUAL BRUISING OR BLEEDING TENDERNESS IN MOUTH AND THROAT WITH OR WITHOUT PRESENCE OF ULCERS URINARY PROBLEMS BOWEL PROBLEMS UNUSUAL RASH     Wear comfortable clothing and clothing appropriate for easy access to any Portacath or PICC line. Let us know if there is anything that we can do to make your therapy better!   What to do if you need assistance after hours or on the weekends: CALL [phone].   HOLD on the line, do not hang up.  You will hear multiple messages but at the end you will be connected with a nurse triage line.  They will contact the doctor if necessary.  Most of the time they will be able to assist you.  Do not call the hospital operator.    I have been informed and understand all of the instructions given to me and have received a copy. I have been instructed to call the clinic [phone] or my family physician as soon as possible for continued medical care, if indicated. I do not have any more questions at this time but understand that I may call the Cancer Center or the Patient Navigator at [phone] during office hours should I have questions or need assistance in obtaining follow-up care.

## 2022-12-22 NOTE — Progress Notes (Signed)
[facility] [address] [place] [address]   Clinic Day:  12/22/2022  Referring physician: Martina Sinner, MD  Patient Care Team: Billie Lade, MD as PCP - General (Internal Medicine) [name], MD as PCP - Cardiology (Cardiology) [name], MD as Consulting Physician (Internal Medicine) [name], RN as Oncology Nurse Navigator (Medical Oncology) [name], MD as Medical Oncologist (Medical Oncology)   ASSESSMENT & PLAN:   Assessment:  1.  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  No weight loss or hemoptysis. - CT angio chest (11/30/2022): Left upper lobe mass with central necrosis and bulky mediastinal, left hilar and left lower neck adenopathy and lytic lesion in the right superior sternum. - Bronchoscopy (12/01/2022): Narrowing of LUL apical and posterior segment.  Otherwise normal-appearing bronchial trees. - LUL upper lobe biopsy (12/01/2022): Invasive moderately differentiated squamous cell carcinoma. - PET scan (12/16/2022): Left supraclavicular 3.2 cm mass, direct extension from mediastinal adenopathy.  Small right supraclavicular lymph node.  Large left lung mass 7.4 x 6.1 cm extending into the left hilum and AP window.  Prevascular and paratracheal lymph nodes and right paratracheal node.  Extensive involvement of sternum manubrium and first rib close to manubrium.  Soft tissue extension into the medial border of the overlying pectoralis muscle.  Discrete hypermetabolic right upper lobe nodule 10 mm.  No abdominal or pelvic metastatic disease. - Concurrent chemo XRT started on 12/23/2022.  2.  Social/family history: - Lives at home with his wife.  He is independent of ADLs and IADLs.  Retired from work in April 2024.  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  Current active smoker, 1 pack/day started at age 54 and  quit at the time of diagnosis. - Sister had cancer.  2 paternal cousins had cancer.   Plan:  1.  Stage IV (T4 N2 M1) squamous cell lung cancer: - I have reviewed his pathology reports and PET scan images. - Apparently his case was discussed at the tumor board and he was started on radiation therapy.  He was referred to [place] for initiation of weekly chemotherapy with radiation. - He has MRI of the brain scheduled next week. - We will send NGS testing. - We talked about concurrent chemotherapy with carboplatin and paclitaxel delivered weekly.  We discussed side effects in detail.  We will start his first dose Friday. - We also discussed need for port placement. - RTC next week for follow-up with labs and treatment.  2.  Chest and neck tightness/pain: - Continue Norco 7.5/325 every 6 hours as needed.  Will send refill.  3.  Hypertension: - Blood pressure today is 107/63.  Recommend holding lisinopril and Toprol-XL.   Orders Placed This Encounter  Procedures   IR IMAGING GUIDED PORT INSERTION    Standing Status:   Future    Standing Expiration Date:   12/22/2023    Order Specific Question:   Reason for Exam (SYMPTOM  OR DIAGNOSIS REQUIRED)    Answer:   chemotherapy administration    Order Specific Question:   Preferred Imaging Location?    Answer:   [facility]    Order Specific Question:   Release to patient    Answer:   Immediate   Magnesium    Standing Status:   Future    Standing Expiration Date:   12/25/2023   CBC  with Differential    Standing Status:   Future    Standing Expiration Date:   12/25/2023   Comprehensive metabolic panel    Standing Status:   Future    Standing Expiration Date:   12/25/2023   Magnesium    Standing Status:   Future    Standing Expiration Date:   01/01/2024   CBC with Differential    Standing Status:   Future    Standing Expiration Date:   01/01/2024   Comprehensive metabolic panel    Standing Status:   Future    Standing Expiration Date:    01/01/2024   Magnesium    Standing Status:   Future    Standing Expiration Date:   01/08/2024   CBC with Differential    Standing Status:   Future    Standing Expiration Date:   01/08/2024   Comprehensive metabolic panel    Standing Status:   Future    Standing Expiration Date:   01/08/2024   Magnesium    Standing Status:   Future    Standing Expiration Date:   01/15/2024   CBC with Differential    Standing Status:   Future    Standing Expiration Date:   01/15/2024   Comprehensive metabolic panel    Standing Status:   Future    Standing Expiration Date:   01/15/2024   Magnesium    Standing Status:   Future    Standing Expiration Date:   01/22/2024   CBC with Differential    Standing Status:   Future    Standing Expiration Date:   01/22/2024   Comprehensive metabolic panel    Standing Status:   Future    Standing Expiration Date:   01/22/2024   Magnesium    Standing Status:   Future    Standing Expiration Date:   01/29/2024   CBC with Differential    Standing Status:   Future    Standing Expiration Date:   01/29/2024   Comprehensive metabolic panel    Standing Status:   Future    Standing Expiration Date:   01/29/2024      Mikeal Hawthorne R Teague,acting as a scribe for [name], MD.,have documented all relevant documentation on the behalf of [name], MD,as directed by  [name], MD while in the presence of [name], MD.   I, [name] MD, have reviewed the above documentation for accuracy and completeness, and I agree with the above.   [name], MD   7/9/20245:47 PM  CHIEF COMPLAINT/PURPOSE OF CONSULT:   Diagnosis: Squamous Cell Carcinoma of Left Lung   Cancer Staging  Squamous cell lung cancer, left (HCC) Staging form: Lung, AJCC 8th Edition - Clinical stage from 12/22/2022: Stage IVA (cT4, cN3, cM1a) - Unsigned    Prior Therapy: none  Current Therapy: Chemoradiation therapy   HISTORY OF PRESENT ILLNESS:    Oncology History  Squamous cell lung cancer, left (HCC)  12/22/2022 Initial Diagnosis   Squamous cell lung cancer, left (HCC)   12/25/2022 -  Chemotherapy   Patient is on Treatment Plan : ESOPHAGUS Carboplatin + Paclitaxel Weekly X 6 Weeks with XRT         Jimi is a 62 y.o. male presenting to clinic today for evaluation of Squamous Cell Carcinoma of Left Lung at the request of [facility].  Patient underwent an NM PET Skull Base to Thigh on 7/3 that showed large LEFT upper lobe mass consistent with primary bronchogenic carcinoma, extensive metastatic adenopathy to  the AP window, bulky LEFT supraclavicular disease, direct invasion of the AP window and LEFT superior hilum, and direct skeletal invasion of the manubrium and first RIGHT rib costamanubrial junction.   Patient was seen in the ED at [facility] on 6/20 for chest pain and occasionally coughing up blood. CT of patient showed negative for PE. Symptoms improved on their own and patient was discharged.   Today, he states that he is doing well overall. His appetite level is at 70%. His energy level is at 60%.  PAST MEDICAL HISTORY:   Past Medical History: Past Medical History:  Diagnosis Date   Bronchitis    CAD (coronary artery disease)    a. cath 11/26/2014 EF 45%, moderate to severe inferior hypokinesis, 90% small D1, 100% mid to distal RCA with minimal collateral, 40% mid to distal LCx. Medical therapy as RCA occlusion appears to be completed event; DES to mid circumflex October 2019   Essential hypertension    Hyperlipidemia    Ischemic cardiomyopathy    NSTEMI (non-ST elevated myocardial infarction) (HCC)    11/26/14, 01/31/18    Surgical History: Past Surgical History:  Procedure Laterality Date   BRONCHIAL BIOPSY  12/01/2022   Procedure: BRONCHIAL BIOPSIES;  Surgeon: Martina Sinner, MD;  Location: [facility] ENDOSCOPY;  Service: Pulmonary;;   BRONCHIAL BRUSHINGS  12/01/2022   Procedure: BRONCHIAL BRUSHINGS;  Surgeon: Martina Sinner, MD;  Location: [facility] ENDOSCOPY;  Service: Pulmonary;;   BRONCHIAL WASHINGS  12/01/2022   Procedure: BRONCHIAL WASHINGS;  Surgeon: Martina Sinner, MD;  Location: [facility] ENDOSCOPY;  Service: Pulmonary;;   CARDIAC CATHETERIZATION N/A 11/26/2014   Procedure: Left Heart Cath and Coronary Angiography;  Surgeon: Peter M Swaz[place]and, MD;  Location: [facility] INVASIVE CV LAB;  Service: Cardiovascular;  Laterality: N/A;   COLONOSCOPY     per patient: done in [place], age 21, couple of polyps.   COLONOSCOPY WITH PROPOFOL N/A 10/08/2022   Procedure: COLONOSCOPY WITH PROPOFOL;  Surgeon: Lanelle Bal, DO;  Location: [facility] ENDO SUITE;  Service: Endoscopy;  Laterality: N/A;  11:00 am   CORONARY STENT INTERVENTION N/A 01/31/2018   Procedure: CORONARY STENT INTERVENTION;  Surgeon: Runell Gess, MD;  Location: [facility] INVASIVE CV LAB;  Service: Cardiovascular;  Laterality: N/A;   HEMOSTASIS CONTROL  12/01/2022   Procedure: HEMOSTASIS CONTROL;  Surgeon: Martina Sinner, MD;  Location: [facility] ENDOSCOPY;  Service: Pulmonary;;  cold saline   LEFT HEART CATH AND CORONARY ANGIOGRAPHY N/A 01/31/2018   Procedure: LEFT HEART CATH AND CORONARY ANGIOGRAPHY;  Surgeon: Runell Gess, MD;  Location: [facility] INVASIVE CV LAB;  Service: Cardiovascular;  Laterality: N/A;   NECK SURGERY  2005   VIDEO BRONCHOSCOPY N/A 12/01/2022   Procedure: VIDEO BRONCHOSCOPY WITH FLUORO;  Surgeon: Martina Sinner, MD;  Location: [facility] ENDOSCOPY;  Service: Pulmonary;  Laterality: N/A;    Social History: Social History   Socioeconomic History   Marital status: Married    Spouse name: Not on file   Number of children: Not on file   Years of education: Not on file   Highest education level: Not on file  Occupational History   Not on file  Tobacco Use   Smoking status: Every Day    Packs/day: 1.00    Years: 42.00    Additional pack years: 0.00    Total pack years: 42.00    Types: Cigarettes   Smokeless tobacco: Never   Tobacco comments:    He used  to quit and start back. He  has not quit for the past 5 years.   Vaping Use   Vaping Use: Never used  Substance and Sexual Activity   Alcohol use: Yes    Comment: 10 cans of beer   Drug use: Yes    Types: Marijuana    Comment: smokes week here and there   Sexual activity: Yes    Birth control/protection: None  Other Topics Concern   Not on file  Social History Narrative   Works at [employer] and live with wife.    Social Determinants of Health   Financial Resource Strain: Not on file  Food Insecurity: No Food Insecurity (12/14/2022)   Hunger Vital Sign    Worried About Running Out of Food in the Last Year: Never true    Ran Out of Food in the Last Year: Never true  Transportation Needs: No Transportation Needs (12/14/2022)   PRAPARE - Administrator, Civil Service (Medical): No    Lack of Transportation (Non-Medical): No  Physical Activity: Not on file  Stress: Not on file  Social Connections: Not on file  Intimate Partner Violence: Not At Risk (12/14/2022)   Humiliation, Afraid, Rape, and Kick questionnaire    Fear of Current or Ex-Partner: No    Emotionally Abused: No    Physically Abused: No    Sexually Abused: No    Family History: Family History  Problem Relation Age of Onset   Diabetes Mother    Heart attack Father    Cancer Sister    Hypertension Brother    Colon cancer Neg Hx    Prostate cancer Neg Hx    Lung cancer Neg Hx     Current Medications:  Current Outpatient Medications:    acetaminophen (TYLENOL) 500 MG tablet, Take 500 mg by mouth every 6 (six) hours as needed for mild pain., Disp: , Rfl:    albuterol (VENTOLIN HFA) 108 (90 Base) MCG/ACT inhaler, Inhale 2 puffs into the lungs every 6 (six) hours as needed for wheezing or shortness of breath., Disp: 8 g, Rfl: 2   aspirin EC 81 MG tablet, Take 1 tablet (81 mg total) by mouth daily. Swallow whole., Disp: 90 tablet, Rfl: 3   atorvastatin (LIPITOR) 80 MG tablet, Take 1 tablet (80 mg  total) by mouth daily. TAKE 1 TABLET BY MOUTH EVERY DAY AT 6 PM (Patient taking differently: Take 80 mg by mouth at bedtime.), Disp: 90 tablet, Rfl: 1   dapagliflozin propanediol (FARXIGA) 10 MG TABS tablet, Take 1 tablet (10 mg total) by mouth daily before breakfast., Disp: 90 tablet, Rfl: 3   empagliflozin (JARDIANCE) 10 MG TABS tablet, Take 1 tablet (10 mg total) by mouth daily before breakfast., Disp: 30 tablet, Rfl: 3   ezetimibe (ZETIA) 10 MG tablet, Take 1 tablet (10 mg total) by mouth daily., Disp: 90 tablet, Rfl: 3   lisinopril (ZESTRIL) 10 MG tablet, Take 1 tablet by mouth once daily, Disp: 30 tablet, Rfl: 0   Menthol, Topical Analgesic, (BIOFREEZE EX), Apply 1 Application topically daily as needed (pain)., Disp: , Rfl:    metoprolol succinate (TOPROL-XL) 25 MG 24 hr tablet, Take 1 tablet by mouth once daily, Disp: 90 tablet, Rfl: 0   nitroGLYCERIN (NITROSTAT) 0.4 MG SL tablet, Place 1 tablet (0.4 mg total) under the tongue every 5 (five) minutes as needed., Disp: 25 tablet, Rfl: 0   sildenafil (VIAGRA) 50 MG tablet, Take 50 mg by mouth daily as needed., Disp: , Rfl:    HYDROcodone-acetaminophen (  NORCO) 7.5-325 MG tablet, Take 1 tablet by mouth every 6 (six) hours as needed for moderate pain., Disp: 120 tablet, Rfl: 0   Allergies: No Known Allergies  REVIEW OF SYSTEMS:   Review of Systems  Constitutional:  Negative for chills, fatigue and fever.  HENT:   Negative for lump/mass, mouth sores, nosebleeds, sore throat and trouble swallowing.   Eyes:  Negative for eye problems.  Respiratory:  Positive for shortness of breath. Negative for cough.   Cardiovascular:  Positive for chest pain. Negative for leg swelling and palpitations.  Gastrointestinal:  Negative for abdominal pain, constipation, diarrhea, nausea and vomiting.  Genitourinary:  Negative for bladder incontinence, difficulty urinating, dysuria, frequency, hematuria and nocturia.   Musculoskeletal:  Negative for arthralgias,  back pain, flank pain, myalgias and neck pain.  Skin:  Negative for itching and rash.  Neurological:  Positive for dizziness, headaches and numbness.  Hematological:  Does not bruise/bleed easily.  Psychiatric/Behavioral:  Negative for depression, sleep disturbance and suicidal ideas. The patient is nervous/anxious.   All other systems reviewed and are negative.    VITALS:   Blood pressure 107/63, pulse (!) 54, temperature (!) 97.2 F (36.2 C), resp. rate 16, height 5\' 6"  (1.676 m), weight 181 lb (82.1 kg), SpO2 98 %.  Wt Readings from Last 3 Encounters:  12/22/22 181 lb (82.1 kg)  12/14/22 188 lb 3.2 oz (85.4 kg)  11/30/22 188 lb (85.3 kg)    Body mass index is 29.21 kg/m.  Performance status (ECOG): 1 - Symptomatic but completely ambulatory  PHYSICAL EXAM:   Physical Exam Vitals and nursing note reviewed. Exam conducted with a chaperone present.  Constitutional:      Appearance: Normal appearance.  Cardiovascular:     Rate and Rhythm: Normal rate and regular rhythm.     Pulses: Normal pulses.     Heart sounds: Normal heart sounds.  Pulmonary:     Effort: Pulmonary effort is normal.     Breath sounds: Normal breath sounds.  Abdominal:     Palpations: Abdomen is soft. There is no hepatomegaly, splenomegaly or mass.     Tenderness: There is no abdominal tenderness.  Musculoskeletal:     Right lower leg: No edema.     Left lower leg: No edema.  Lymphadenopathy:     Cervical: No cervical adenopathy.     Right cervical: No superficial, deep or posterior cervical adenopathy.    Left cervical: No superficial, deep or posterior cervical adenopathy.     Upper Body:     Right upper body: No supraclavicular or axillary adenopathy.     Left upper body: Supraclavicular adenopathy present. No axillary adenopathy.  Neurological:     General: No focal deficit present.     Mental Status: He is alert and oriented to person, place, and time.  Psychiatric:        Mood and Affect:  Mood normal.        Behavior: Behavior normal.     LABS:      Latest Ref Rng & Units 12/03/2022   11:00 PM 12/01/2022    9:01 AM 11/30/2022    3:36 PM  CBC  WBC 4.0 - 10.5 K/uL 14.7  11.5  14.1   Hemoglobin 13.0 - 17.0 g/dL 91.4  78.2  95.6   Hematocrit 39.0 - 52.0 % 36.6  38.7  38.9   Platelets 150 - 400 K/uL 381  421  389       Latest Ref Rng & Units  12/03/2022   11:00 PM 12/01/2022    9:01 AM 11/30/2022    3:36 PM  CMP  Glucose 70 - 99 mg/dL 161  096  97   BUN 8 - 23 mg/dL 14  13  15    Creatinine 0.61 - 1.24 mg/dL 0.45  4.09  8.11   Sodium 135 - 145 mmol/L 130  133  135   Potassium 3.5 - 5.1 mmol/L 3.9  4.4  4.7   Chloride 98 - 111 mmol/L 94  97  100   CO2 22 - 32 mmol/L 27  24  26    Calcium 8.9 - 10.3 mg/dL 9.9  91.4  9.9      No results found for: "CEA1", "CEA" / No results found for: "CEA1", "CEA" Lab Results  Component Value Date   PSA1 0.6 07/31/2021   No results found for: "NWG956" No results found for: "CAN125"  No results found for: "TOTALPROTELP", "ALBUMINELP", "A1GS", "A2GS", "BETS", "BETA2SER", "GAMS", "MSPIKE", "SPEI" No results found for: "TIBC", "FERRITIN", "IRONPCTSAT" No results found for: "LDH"   STUDIES:   NM PET Image Initial (PI) Skull Base To Thigh  Result Date: 12/21/2022 CLINICAL DATA:  Initial treatment strategy for non-small cell lung cancer. Chest wall invasion. EXAM: NUCLEAR MEDICINE PET SKULL BASE TO THIGH TECHNIQUE: 10.3 mCi F-18 FDG was injected intravenously. Full-ring PET imaging was performed from the skull base to thigh after the radiotracer. CT data was obtained and used for attenuation correction and anatomic localization. Fasting blood glucose: 79 mg/dl COMPARISON:  Chest CT 21/30/8657 FINDINGS: Mediastinal blood pool activity: SUV max Liver activity: SUV max NA NECK: Hypermetabolic LEFT supraclavicular mass measures 3.2 cm with SUV max equal 23.5. LEFT super clavicular mass is a direct extension from the mediastinal adenopathy.  Smaller hypermetabolic RIGHT supraclavicular node on image 35 with SUV max equal 19. Incidental CT findings: None. CHEST: Large LEFT upper lobe mass measures 7.4 x 6.1 cm i with) ntense metabolic activity (SUV max equal 23. Mass extends to the LEFT hilum as well as to the AP window. Hypermetabolic prevascular and paratracheal nodes are present. Contralateral RIGHT paratracheal node measures 20 mm on image 43 with intense metabolic activity. There is extensive involvement of the sternum manubrium and first RIGHT rib costo manubrial. There is soft tissue extension into the medial border of the overlying pectoralis muscle (image 45 with SUV max equal 20) There is discrete hypermetabolic RIGHT upper lobe pulmonary nodule measuring 10 mm with SUV max equal 11 on image 52. There is an additional hypermetabolic lymph node along the descending thoracic aorta/lateral border image 56. Incidental CT findings: None. ABDOMEN/PELVIS: No abnormal hypermetabolic activity within the liver, pancreas, adrenal glands, or spleen. No hypermetabolic lymph nodes in the abdomen or pelvis. Incidental CT findings: None. SKELETON: Direct invasion of the manubrium. No additional evidence skeletal metastasis Incidental CT findings: None. IMPRESSION: 1. 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 Direct invasion of the AP window and LEFT superior hilum. 2. Extensive metastatic adenopathy to the AP window, contralateral paratracheal nodes as well as bilateral supraclavicular nodes. Bulky LEFT supraclavicular disease. 3. Direct skeletal invasion of the manubrium and first RIGHT rib costamanubrial junction 4. Soft tissue extension into the medial border of the overlying RIGHT pectoralis muscle. 5. Discrete hypermetabolic RIGHT upper lobe pulmonary nodule consistent with metastatic disease. 6. No evidence metastatic disease in the abdomen pelvis. Electronically Signed   By: Genevive Bi M.D.   On: 12/21/2022 15:16    CT Angio Chest  PE W/Cm &/Or Wo Cm  Result Date: 12/04/2022 CLINICAL DATA:  Chest pain EXAM: CT ANGIOGRAPHY CHEST WITH CONTRAST TECHNIQUE: Multidetector CT imaging of the chest was performed using the standard protocol during bolus administration of intravenous contrast. Multiplanar CT image reconstructions and MIPs were obtained to evaluate the vascular anatomy. RADIATION DOSE REDUCTION: This exam was performed according to the departmental dose-optimization program which includes automated exposure control, adjustment of the mA and/or kV according to patient size and/or use of iterative reconstruction technique. CONTRAST:  75mL OMNIPAQUE IOHEXOL 350 MG/ML SOLN COMPARISON:  11/30/2022 FINDINGS: Cardiovascular: Contrast injection is sufficient to demonstrate satisfactory opacification of the pulmonary arteries to the segmental level. There is no pulmonary embolus or evidence of right heart strain. The size of the main pulmonary artery is normal. Heart size is normal, with no pericardial effusion. The course and caliber of the aorta are normal. There is atherosclerotic calcification. Opacification decreased due to pulmonary arterial phase contrast bolus timing. Mediastinum/Nodes: There is mediastinal extension of the left upper lobe, left hilar mass, unchanged. Encasement of the left upper lobe arterial branches is unchanged. Bulky mediastinal adenopathy is unchanged. Lungs/Pleura: Unchanged appearance of left upper lobe mass, measuring 7.1 x 6.0 cm. Small left pleural effusion. Upper Abdomen: Contrast bolus timing is not optimized for evaluation of the abdominal organs. The visualized portions of the organs of the upper abdomen are normal. Musculoskeletal: Unchanged appearance of superior right sternal lytic lesion. Review of the MIP images confirms the above findings. IMPRESSION: 1. No pulmonary embolus or acute aortic syndrome. 2. Unchanged appearance of left upper lobe mass with mediastinal extension and  bulky mediastinal adenopathy. 3. Small left pleural effusion. Aortic Atherosclerosis (ICD10-I70.0). Electronically Signed   By: Deatra Robinson M.D.   On: 12/04/2022 01:54   DG Chest Port 1 View  Result Date: 12/03/2022 CLINICAL DATA:  Chest pain. EXAM: PORTABLE CHEST 1 VIEW COMPARISON:  12/02/2022. FINDINGS: Heart is enlarged and the mediastinal contour is within normal limits. Lung volumes are low and there is chronic elevation of the left diaphragm. A stable focal opacities noted in the left upper lobe, compatible with known mass. No effusion or pneumothorax. Cervical spinal fusion hardware is noted. No acute osseous abnormality. IMPRESSION: Stable opacity in the left upper lobe, compatible with known mass. No acute abnormality is seen. Electronically Signed   By: Thornell Sartorius M.D.   On: 12/03/2022 23:11   DG CHEST PORT 1 VIEW  Result Date: 12/01/2022 CLINICAL DATA:  Status post bronchoscopy with biopsy. EXAM: PORTABLE CHEST 1 VIEW COMPARISON:  November 30, 2022. FINDINGS: Stable cardiomediastinal silhouette. Left upper lobe lung mass is again noted. No definite pneumothorax or pleural effusion is noted status post bronchoscopy. Right lung is clear. Bony thorax is unremarkable. IMPRESSION: No definite pneumothorax or pleural effusion status post bronchoscopy and biopsy of left upper lobe lung mass. Electronically Signed   By: Lupita Raider M.D.   On: 12/01/2022 16:00   DG C-ARM BRONCHOSCOPY  Result Date: 12/01/2022 C-ARM BRONCHOSCOPY: Fluoroscopy was utilized by the requesting physician.  No radiographic interpretation.   CT Angio Chest PE W and/or Wo Contrast  Result Date: 11/30/2022 CLINICAL DATA:  High probability for PE with left-sided chest pain. EXAM: CT ANGIOGRAPHY CHEST WITH CONTRAST TECHNIQUE: Multidetector CT imaging of the chest was performed using the standard protocol during bolus administration of intravenous contrast. Multiplanar CT image reconstructions and MIPs were obtained to  evaluate the vascular anatomy. RADIATION DOSE REDUCTION: This exam was performed according to the  departmental dose-optimization program which includes automated exposure control, adjustment of the mA and/or kV according to patient size and/or use of iterative reconstruction technique. CONTRAST:  75mL OMNIPAQUE IOHEXOL 350 MG/ML SOLN COMPARISON:  CT PE protocol 11/26/2014 FINDINGS: Cardiovascular: Satisfactory opacification of the pulmonary arteries to the segmental level. No evidence of pulmonary embolism. Normal heart size. No pericardial effusion. There are atherosclerotic calcifications of the aorta. Mediastinum/Nodes: There is bulky prevascular lymphadenopathy measuring 5.9 x 3.1 cm. There are multiple enlarged left hilar lymph nodes the largest measuring 1.9 by 2.3 cm. There are enlarged lymph nodes in the lower left neck with the largest lymph node measuring 2.2 by 4.3 cm lateral to the thyroid gland. Confluence soft tissue is seen abutting in between the left common carotid and left subclavian artery measuring 2.0 x 4.0 cm image 4/19. Visualized thyroid gland and esophagus are within normal limits. Lungs/Pleura: There is a left upper lobe mass measuring 8.0 x 6.0 x 7.1 cm. This abuts the mediastinum and right hilum. Central area of low attenuation and air likely related to necrosis. There is a right upper lobe nodule measuring 8 mm image 6/60. There is no pleural effusion or pneumothorax. There is cutoff of the left upper lobe bronchus. Upper Abdomen: No acute abnormality. Musculoskeletal: Lytic lesion with soft tissue component in the region of the right superior sternum is present with soft tissue component extending into the anterior mediastinum. Anterior mediastinal soft tissue component measures 6.2 x 2.2 x 3.8 cm. Cervical spinal fusion plate is present. Review of the MIP images confirms the above findings. IMPRESSION: 1. No evidence for pulmonary embolism. 2. Left upper lobe mass with central necrosis  worrisome for primary bronchogenic carcinoma. 3. Bulky mediastinal, left hilar and left lower neck lymphadenopathy worrisome for metastatic disease. 4. Lytic lesion in the right superior sternum with soft tissue component extending into the anterior mediastinum worrisome for metastatic disease. 5. 8 mm right upper lobe pulmonary nodule worrisome for metastatic disease. Aortic Atherosclerosis (ICD10-I70.0). Electronically Signed   By: Darliss Cheney M.D.   On: 11/30/2022 22:47   DG Cervical Spine 2 or 3 views  Result Date: 11/30/2022 CLINICAL DATA:  Neck pain without recent injury. EXAM: CERVICAL SPINE - 2-3 VIEW COMPARISON:  December 08, 2003. FINDINGS: Status post surgical anterior fusion of C5-6 and C6-7. No acute fracture or spondylolisthesis is noted. Large anterior osteophyte formation is noted at C4-5. No prevertebral soft tissue swelling is noted. IMPRESSION: Postsurgical and degenerative changes as described above. No acute abnormality seen. Electronically Signed   By: Lupita Raider M.D.   On: 11/30/2022 16:14   DG Chest 2 View  Result Date: 11/30/2022 CLINICAL DATA:  Chest pain. EXAM: CHEST - 2 VIEW COMPARISON:  March 19, 2021. FINDINGS: Stable cardiomediastinal silhouette. Large lobulated left upper lobe mass is noted concerning for malignancy. Right lung is clear. Bony thorax is unremarkable. IMPRESSION: Left upper lobe density is noted most consistent with left upper lobe mass or malignancy with associated atelectasis. CT scan of the chest with intravenous contrast is recommended for further evaluation. Electronically Signed   By: Lupita Raider M.D.   On: 11/30/2022 16:11

## 2022-12-22 NOTE — Progress Notes (Signed)
START OFF PATHWAY REGIMEN - Non-Small Cell Lung   [id number]:Carboplatin + Paclitaxel (2/50) + RT weekly x 6 weeks:   Administer weekly during RT:     Paclitaxel      Carboplatin   **Always confirm dose/schedule in your pharmacy ordering system**  Patient Characteristics: Preoperative or Nonsurgical Candidate (Clinical Staging), Distant Metastasis Therapeutic Status: Preoperative or Nonsurgical Candidate (Clinical Staging) AJCC T Category: cT4 AJCC N Category: cN3 AJCC M Category: cM1a AJCC 8 Stage Grouping: IVA Intent of Therapy: Non-Curative / Palliative Intent, Discussed with Patient

## 2022-12-23 ENCOUNTER — Encounter (HOSPITAL_COMMUNITY): Payer: Self-pay

## 2022-12-23 ENCOUNTER — Other Ambulatory Visit: Payer: Self-pay

## 2022-12-23 ENCOUNTER — Encounter: Payer: Self-pay | Admitting: Pulmonary Disease

## 2022-12-23 ENCOUNTER — Ambulatory Visit (HOSPITAL_COMMUNITY)
Admission: RE | Admit: 2022-12-23 | Discharge: 2022-12-23 | Disposition: A | Discharge status: Home / Self Care | Payer: 59 | Source: Ambulatory Visit | Attending: Hematology | Admitting: Hematology

## 2022-12-23 ENCOUNTER — Ambulatory Visit
Admission: RE | Admit: 2022-12-23 | Discharge: 2022-12-23 | Disposition: A | Discharge status: Home / Self Care | Payer: 59 | Source: Ambulatory Visit | Attending: Radiation Oncology | Admitting: Radiation Oncology

## 2022-12-23 DIAGNOSIS — F17210 Nicotine dependence, cigarettes, uncomplicated: Secondary | ICD-10-CM | POA: Diagnosis not present

## 2022-12-23 DIAGNOSIS — Z0189 Encounter for other specified special examinations: Secondary | ICD-10-CM | POA: Diagnosis not present

## 2022-12-23 DIAGNOSIS — C3412 Malignant neoplasm of upper lobe, left bronchus or lung: Secondary | ICD-10-CM | POA: Diagnosis not present

## 2022-12-23 DIAGNOSIS — Z510 Encounter for antineoplastic radiation therapy: Secondary | ICD-10-CM | POA: Diagnosis not present

## 2022-12-23 HISTORY — PX: IR IMAGING GUIDED PORT INSERTION: IMG5740

## 2022-12-23 LAB — RAD ONC ARIA SESSION SUMMARY
Course Elapsed Days: 8
Plan Fractions Treated to Date: 2
Plan Prescribed Dose Per Fraction: 3 Gy
Plan Total Fractions Prescribed: 6
Plan Total Prescribed Dose: 18 Gy
Reference Point Dosage Given to Date: 18 Gy
Reference Point Session Dosage Given: 3 Gy
Session Number: 6

## 2022-12-23 MED ORDER — LIDOCAINE-EPINEPHRINE 1 %-1:100000 IJ SOLN
INTRAMUSCULAR | Status: AC
  Filled 2022-12-23: qty 1

## 2022-12-23 MED ORDER — HEPARIN SOD (PORK) LOCK FLUSH 100 UNIT/ML IV SOLN
INTRAVENOUS | Status: AC
  Filled 2022-12-23: qty 5

## 2022-12-23 MED ORDER — FENTANYL CITRATE (PF) 100 MCG/2ML IJ SOLN
INTRAMUSCULAR | Status: AC | PRN
  Administered 2022-12-23: 25 ug via INTRAVENOUS
  Administered 2022-12-23: 50 ug via INTRAVENOUS

## 2022-12-23 MED ORDER — LIDOCAINE-PRILOCAINE 2.5-2.5 % EX CREA
TOPICAL_CREAM | CUTANEOUS | 3 refills | Status: DC
Start: 2022-12-23 — End: 2023-01-05

## 2022-12-23 MED ORDER — LIDOCAINE-EPINEPHRINE 2 %-1:100000 IJ SOLN
20.0000 mL | Freq: Once | INTRAMUSCULAR | Status: DC
  Filled 2022-12-23: qty 20

## 2022-12-23 MED ORDER — PROCHLORPERAZINE MALEATE 10 MG PO TABS
10.0000 mg | ORAL_TABLET | Freq: Four times a day (QID) | ORAL | 1 refills | Status: DC | PRN
Start: 2022-12-23 — End: 2023-01-05

## 2022-12-23 MED ORDER — MIDAZOLAM HCL 2 MG/2ML IJ SOLN
INTRAMUSCULAR | Status: AC | PRN
  Administered 2022-12-23: 1 mg via INTRAVENOUS

## 2022-12-23 MED ORDER — MIDAZOLAM HCL 2 MG/2ML IJ SOLN
INTRAMUSCULAR | Status: AC
  Filled 2022-12-23: qty 2

## 2022-12-23 MED ORDER — FENTANYL CITRATE (PF) 100 MCG/2ML IJ SOLN
INTRAMUSCULAR | Status: AC
  Filled 2022-12-23: qty 2

## 2022-12-23 NOTE — Procedures (Signed)
  Procedure:  R internal jugular Port Catheter placement   Preprocedure diagnosis: The encounter diagnosis was Primary cancer of left upper lobe of lung (HCC). Postprocedure diagnosis: same EBL:    minimal Complications:   none immediate  See full dictation in [employer].  Thora Lance MD Main # [phone] Pager  [phone] Mobile [phone]

## 2022-12-23 NOTE — H&P (Signed)
Chief Complaint: Lung Cancer   Referring Provider(s): Ellin Saba  Supervising Physician: Oley Balm  Patient Status: [facility] - Out-pt  History of Present Illness: Rodney Mahoney is a 62 y.o. male who initially presented with chest tightness for the last 6-12 months.  CTA done 11/30/22 showed= Left upper lobe mass with central necrosis and bulky mediastinal, left hilar and left lower neck adenopathy and lytic lesion in the right superior sternum.   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  PET done 12/16/22 showed = Left supraclavicular 3.2 cm mass, direct extension from mediastinal adenopathy.  Small right supraclavicular lymph node.  Large left lung mass 7.4 x 6.1 cm extending into the left hilum and AP window.  Prevascular and paratracheal lymph nodes and right paratracheal node.  Extensive involvement of sternum manubrium and first rib close to manubrium.  Soft tissue extension into the medial border of the overlying pectoralis muscle.  Discrete hypermetabolic right upper lobe nodule 10 mm.  No abdominal or pelvic metastatic disease.   He is here today for placement of a tunneled catheter with port.  He is NPO. No nausea/vomiting. No Fever/chills. ROS negative.   Patient is Full Code  Past Medical History:  Diagnosis Date   Bronchitis    CAD (coronary artery disease)    a. cath 11/26/2014 EF 45%, moderate to severe inferior hypokinesis, 90% small D1, 100% mid to distal RCA with minimal collateral, 40% mid to distal LCx. Medical therapy as RCA occlusion appears to be completed event; DES to mid circumflex October 2019   Essential hypertension    Hyperlipidemia    Ischemic cardiomyopathy    NSTEMI (non-ST elevated myocardial infarction) (HCC)    11/26/14, 01/31/18    Past Surgical History:  Procedure Laterality Date   BRONCHIAL BIOPSY  12/01/2022   Procedure: BRONCHIAL BIOPSIES;   Surgeon: Martina Sinner, MD;  Location: [facility] ENDOSCOPY;  Service: Pulmonary;;   BRONCHIAL BRUSHINGS  12/01/2022   Procedure: BRONCHIAL BRUSHINGS;  Surgeon: Martina Sinner, MD;  Location: [facility] ENDOSCOPY;  Service: Pulmonary;;   BRONCHIAL WASHINGS  12/01/2022   Procedure: BRONCHIAL WASHINGS;  Surgeon: Martina Sinner, MD;  Location: [facility] ENDOSCOPY;  Service: Pulmonary;;   CARDIAC CATHETERIZATION N/A 11/26/2014   Procedure: Left Heart Cath and Coronary Angiography;  Surgeon: Peter M Swaziland, MD;  Location: [facility] INVASIVE CV LAB;  Service: Cardiovascular;  Laterality: N/A;   COLONOSCOPY     per patient: done in [place], age 73, couple of polyps.   COLONOSCOPY WITH PROPOFOL N/A 10/08/2022   Procedure: COLONOSCOPY WITH PROPOFOL;  Surgeon: Lanelle Bal, DO;  Location: [facility] ENDO SUITE;  Service: Endoscopy;  Laterality: N/A;  11:00 am   CORONARY STENT INTERVENTION N/A 01/31/2018   Procedure: CORONARY STENT INTERVENTION;  Surgeon: Runell Gess, MD;  Location: [facility] INVASIVE CV LAB;  Service: Cardiovascular;  Laterality: N/A;   HEMOSTASIS CONTROL  12/01/2022   Procedure: HEMOSTASIS CONTROL;  Surgeon: Martina Sinner, MD;  Location: [facility] ENDOSCOPY;  Service: Pulmonary;;  cold saline   LEFT HEART CATH AND CORONARY ANGIOGRAPHY N/A 01/31/2018   Procedure: LEFT HEART CATH AND CORONARY ANGIOGRAPHY;  Surgeon: Runell Gess, MD;  Location: [facility] INVASIVE CV LAB;  Service: Cardiovascular;  Laterality: N/A;   NECK SURGERY  2005   VIDEO BRONCHOSCOPY N/A 12/01/2022   Procedure: VIDEO BRONCHOSCOPY WITH FLUORO;  Surgeon: Martina Sinner, MD;  Location: [facility] ENDOSCOPY;  Service: Pulmonary;  Laterality: N/A;  Allergies: Patient has no known allergies.  Medications: Prior to Admission medications   Medication Sig Start Date End Date Taking? Authorizing Provider  acetaminophen (TYLENOL) 500 MG tablet Take 500 mg by mouth every 6 (six) hours as needed for mild pain.    [provider]  albuterol (VENTOLIN  HFA) 108 (90 Base) MCG/ACT inhaler Inhale 2 puffs into the lungs every 6 (six) hours as needed for wheezing or shortness of breath. 12/02/22   Almon Hercules, MD  aspirin EC 81 MG tablet Take 1 tablet (81 mg total) by mouth daily. Swallow whole. 06/27/21   Furth, Cadence H, PA-C  atorvastatin (LIPITOR) 80 MG tablet Take 1 tablet (80 mg total) by mouth daily. TAKE 1 TABLET BY MOUTH EVERY DAY AT 6 PM Patient taking differently: Take 80 mg by mouth at bedtime. 08/25/22 02/21/23  Billie Lade, MD  dapagliflozin propanediol (FARXIGA) 10 MG TABS tablet Take 1 tablet (10 mg total) by mouth daily before breakfast. 12/18/22   [name], MD  empagliflozin (JARDIANCE) 10 MG TABS tablet Take 1 tablet (10 mg total) by mouth daily before breakfast. 12/16/22   Billie Lade, MD  ezetimibe (ZETIA) 10 MG tablet Take 1 tablet (10 mg total) by mouth daily. 12/03/21   Donell Beers, FNP  HYDROcodone-acetaminophen (NORCO) 7.5-325 MG tablet Take 1 tablet by mouth every 6 (six) hours as needed for moderate pain. 12/22/22   [name], MD  lisinopril (ZESTRIL) 10 MG tablet Take 1 tablet by mouth once daily 12/02/22   [name], MD  Menthol, Topical Analgesic, (BIOFREEZE EX) Apply 1 Application topically daily as needed (pain).    [provider]  metoprolol succinate (TOPROL-XL) 25 MG 24 hr tablet Take 1 tablet by mouth once daily 12/02/22   Billie Lade, MD  nitroGLYCERIN (NITROSTAT) 0.4 MG SL tablet Place 1 tablet (0.4 mg total) under the tongue every 5 (five) minutes as needed. 02/02/18   Arty Baumgartner, NP  sildenafil (VIAGRA) 50 MG tablet Take 50 mg by mouth daily as needed. 12/02/22   [provider]     Family History  Problem Relation Age of Onset   Diabetes Mother    Heart attack Father    Cancer Sister    Hypertension Brother    Colon cancer Neg Hx    Prostate cancer Neg Hx    Lung cancer Neg Hx     Social History   Socioeconomic History   Marital  status: Married    Spouse name: Not on file   Number of children: Not on file   Years of education: Not on file   Highest education level: Not on file  Occupational History   Not on file  Tobacco Use   Smoking status: Every Day    Packs/day: 1.00    Years: 42.00    Additional pack years: 0.00    Total pack years: 42.00    Types: Cigarettes   Smokeless tobacco: Never   Tobacco comments:    He used to quit and start back. He has not quit for the past 5 years.   Vaping Use   Vaping Use: Never used  Substance and Sexual Activity   Alcohol use: Yes    Comment: 10 cans of beer   Drug use: Yes    Types: Marijuana    Comment: smokes week here and there   Sexual activity: Yes    Birth control/protection: None  Other Topics Concern  Not on file  Social History Narrative   Works at [employer] and live with wife.    Social Determinants of Health   Financial Resource Strain: Not on file  Food Insecurity: No Food Insecurity (12/14/2022)   Hunger Vital Sign    Worried About Running Out of Food in the Last Year: Never true    Ran Out of Food in the Last Year: Never true  Transportation Needs: No Transportation Needs (12/14/2022)   PRAPARE - Administrator, Civil Service (Medical): No    Lack of Transportation (Non-Medical): No  Physical Activity: Not on file  Stress: Not on file  Social Connections: Not on file     Review of Systems: A 12 point ROS discussed and pertinent positives are indicated in the HPI above.  All other systems are negative.  Review of Systems  Vital Signs: BP 111/67   Pulse 88   Temp 97.8 F (36.6 C) (Temporal)   Resp 14   Ht 5\' 6"  (1.676 m)   Wt 181 lb (82.1 kg)   SpO2 96%   BMI 29.21 kg/m   Advance Care Plan: The advanced care place/surrogate decision maker was discussed at the time of visit and the patient did not wish to discuss or was not able to name a surrogate decision maker or provide an advance care plan.  Physical  Exam Vitals reviewed.  Constitutional:      Appearance: Normal appearance.  HENT:     Head: Normocephalic and atraumatic.  Eyes:     Extraocular Movements: Extraocular movements intact.  Cardiovascular:     Rate and Rhythm: Normal rate and regular rhythm.  Pulmonary:     Effort: Pulmonary effort is normal. No respiratory distress.     Breath sounds: Normal breath sounds.  Abdominal:     General: There is no distension.     Palpations: Abdomen is soft.     Tenderness: There is no abdominal tenderness.  Musculoskeletal:        General: Normal range of motion.     Cervical back: Normal range of motion.  Skin:    General: Skin is warm and dry.  Neurological:     General: No focal deficit present.     Mental Status: He is alert and oriented to person, place, and time.  Psychiatric:        Mood and Affect: Mood normal.        Behavior: Behavior normal.        Thought Content: Thought content normal.        Judgment: Judgment normal.     Imaging: NM PET Image Initial (PI) Skull Base To Thigh  Result Date: 12/21/2022 CLINICAL DATA:  Initial treatment strategy for non-small cell lung cancer. Chest wall invasion. EXAM: NUCLEAR MEDICINE PET SKULL BASE TO THIGH TECHNIQUE: 10.3 mCi F-18 FDG was injected intravenously. Full-ring PET imaging was performed from the skull base to thigh after the radiotracer. CT data was obtained and used for attenuation correction and anatomic localization. Fasting blood glucose: 79 mg/dl COMPARISON:  Chest CT 41/66/0630 FINDINGS: Mediastinal blood pool activity: SUV max Liver activity: SUV max NA NECK: Hypermetabolic LEFT supraclavicular mass measures 3.2 cm with SUV max equal 23.5. LEFT super clavicular mass is a direct extension from the mediastinal adenopathy. Smaller hypermetabolic RIGHT supraclavicular node on image 35 with SUV max equal 19. Incidental CT findings: None. CHEST: Large LEFT upper lobe mass measures 7.4 x 6.1 cm i with) ntense metabolic activity  (  SUV max equal 23. Mass extends to the LEFT hilum as well as to the AP window. Hypermetabolic prevascular and paratracheal nodes are present. Contralateral RIGHT paratracheal node measures 20 mm on image 43 with intense metabolic activity. There is extensive involvement of the sternum manubrium and first RIGHT rib costo manubrial. There is soft tissue extension into the medial border of the overlying pectoralis muscle (image 45 with SUV max equal 20) There is discrete hypermetabolic RIGHT upper lobe pulmonary nodule measuring 10 mm with SUV max equal 11 on image 52. There is an additional hypermetabolic lymph node along the descending thoracic aorta/lateral border image 56. Incidental CT findings: None. ABDOMEN/PELVIS: No abnormal hypermetabolic activity within the liver, pancreas, adrenal glands, or spleen. No hypermetabolic lymph nodes in the abdomen or pelvis. Incidental CT findings: None. SKELETON: Direct invasion of the manubrium. No additional evidence skeletal metastasis Incidental CT findings: None. IMPRESSION: 1. 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 Direct invasion of the AP window and LEFT superior hilum. 2. Extensive metastatic adenopathy to the AP window, contralateral paratracheal nodes as well as bilateral supraclavicular nodes. Bulky LEFT supraclavicular disease. 3. Direct skeletal invasion of the manubrium and first RIGHT rib costamanubrial junction 4. Soft tissue extension into the medial border of the overlying RIGHT pectoralis muscle. 5. Discrete hypermetabolic RIGHT upper lobe pulmonary nodule consistent with metastatic disease. 6. No evidence metastatic disease in the abdomen pelvis. Electronically Signed   By: Genevive Bi M.D.   On: 12/21/2022 15:16   CT Angio Chest PE W/Cm &/Or Wo Cm  Result Date: 12/04/2022 CLINICAL DATA:  Chest pain EXAM: CT ANGIOGRAPHY CHEST WITH CONTRAST TECHNIQUE: Multidetector CT imaging of the chest was performed using the  standard protocol during bolus administration of intravenous contrast. Multiplanar CT image reconstructions and MIPs were obtained to evaluate the vascular anatomy. RADIATION DOSE REDUCTION: This exam was performed according to the departmental dose-optimization program which includes automated exposure control, adjustment of the mA and/or kV according to patient size and/or use of iterative reconstruction technique. CONTRAST:  75mL OMNIPAQUE IOHEXOL 350 MG/ML SOLN COMPARISON:  11/30/2022 FINDINGS: Cardiovascular: Contrast injection is sufficient to demonstrate satisfactory opacification of the pulmonary arteries to the segmental level. There is no pulmonary embolus or evidence of right heart strain. The size of the main pulmonary artery is normal. Heart size is normal, with no pericardial effusion. The course and caliber of the aorta are normal. There is atherosclerotic calcification. Opacification decreased due to pulmonary arterial phase contrast bolus timing. Mediastinum/Nodes: There is mediastinal extension of the left upper lobe, left hilar mass, unchanged. Encasement of the left upper lobe arterial branches is unchanged. Bulky mediastinal adenopathy is unchanged. Lungs/Pleura: Unchanged appearance of left upper lobe mass, measuring 7.1 x 6.0 cm. Small left pleural effusion. Upper Abdomen: Contrast bolus timing is not optimized for evaluation of the abdominal organs. The visualized portions of the organs of the upper abdomen are normal. Musculoskeletal: Unchanged appearance of superior right sternal lytic lesion. Review of the MIP images confirms the above findings. IMPRESSION: 1. No pulmonary embolus or acute aortic syndrome. 2. Unchanged appearance of left upper lobe mass with mediastinal extension and bulky mediastinal adenopathy. 3. Small left pleural effusion. Aortic Atherosclerosis (ICD10-I70.0). Electronically Signed   By: Deatra Robinson M.D.   On: 12/04/2022 01:54   DG Chest Port 1 View  Result  Date: 12/03/2022 CLINICAL DATA:  Chest pain. EXAM: PORTABLE CHEST 1 VIEW COMPARISON:  12/02/2022. FINDINGS: Heart is enlarged and the mediastinal contour is  within normal limits. Lung volumes are low and there is chronic elevation of the left diaphragm. A stable focal opacities noted in the left upper lobe, compatible with known mass. No effusion or pneumothorax. Cervical spinal fusion hardware is noted. No acute osseous abnormality. IMPRESSION: Stable opacity in the left upper lobe, compatible with known mass. No acute abnormality is seen. Electronically Signed   By: Thornell Sartorius M.D.   On: 12/03/2022 23:11   DG CHEST PORT 1 VIEW  Result Date: 12/01/2022 CLINICAL DATA:  Status post bronchoscopy with biopsy. EXAM: PORTABLE CHEST 1 VIEW COMPARISON:  November 30, 2022. FINDINGS: Stable cardiomediastinal silhouette. Left upper lobe lung mass is again noted. No definite pneumothorax or pleural effusion is noted status post bronchoscopy. Right lung is clear. Bony thorax is unremarkable. IMPRESSION: No definite pneumothorax or pleural effusion status post bronchoscopy and biopsy of left upper lobe lung mass. Electronically Signed   By: Lupita Raider M.D.   On: 12/01/2022 16:00   DG C-ARM BRONCHOSCOPY  Result Date: 12/01/2022 C-ARM BRONCHOSCOPY: Fluoroscopy was utilized by the requesting physician.  No radiographic interpretation.   CT Angio Chest PE W and/or Wo Contrast  Result Date: 11/30/2022 CLINICAL DATA:  High probability for PE with left-sided chest pain. EXAM: CT ANGIOGRAPHY CHEST WITH CONTRAST TECHNIQUE: Multidetector CT imaging of the chest was performed using the standard protocol during bolus administration of intravenous contrast. Multiplanar CT image reconstructions and MIPs were obtained to evaluate the vascular anatomy. RADIATION DOSE REDUCTION: This exam was performed according to the departmental dose-optimization program which includes automated exposure control, adjustment of the mA and/or kV  according to patient size and/or use of iterative reconstruction technique. CONTRAST:  75mL OMNIPAQUE IOHEXOL 350 MG/ML SOLN COMPARISON:  CT PE protocol 11/26/2014 FINDINGS: Cardiovascular: Satisfactory opacification of the pulmonary arteries to the segmental level. No evidence of pulmonary embolism. Normal heart size. No pericardial effusion. There are atherosclerotic calcifications of the aorta. Mediastinum/Nodes: There is bulky prevascular lymphadenopathy measuring 5.9 x 3.1 cm. There are multiple enlarged left hilar lymph nodes the largest measuring 1.9 by 2.3 cm. There are enlarged lymph nodes in the lower left neck with the largest lymph node measuring 2.2 by 4.3 cm lateral to the thyroid gland. Confluence soft tissue is seen abutting in between the left common carotid and left subclavian artery measuring 2.0 x 4.0 cm image 4/19. Visualized thyroid gland and esophagus are within normal limits. Lungs/Pleura: There is a left upper lobe mass measuring 8.0 x 6.0 x 7.1 cm. This abuts the mediastinum and right hilum. Central area of low attenuation and air likely related to necrosis. There is a right upper lobe nodule measuring 8 mm image 6/60. There is no pleural effusion or pneumothorax. There is cutoff of the left upper lobe bronchus. Upper Abdomen: No acute abnormality. Musculoskeletal: Lytic lesion with soft tissue component in the region of the right superior sternum is present with soft tissue component extending into the anterior mediastinum. Anterior mediastinal soft tissue component measures 6.2 x 2.2 x 3.8 cm. Cervical spinal fusion plate is present. Review of the MIP images confirms the above findings. IMPRESSION: 1. No evidence for pulmonary embolism. 2. Left upper lobe mass with central necrosis worrisome for primary bronchogenic carcinoma. 3. Bulky mediastinal, left hilar and left lower neck lymphadenopathy worrisome for metastatic disease. 4. Lytic lesion in the right superior sternum with soft  tissue component extending into the anterior mediastinum worrisome for metastatic disease. 5. 8 mm right upper lobe pulmonary nodule worrisome for  metastatic disease. Aortic Atherosclerosis (ICD10-I70.0). Electronically Signed   By: Darliss Cheney M.D.   On: 11/30/2022 22:47   DG Cervical Spine 2 or 3 views  Result Date: 11/30/2022 CLINICAL DATA:  Neck pain without recent injury. EXAM: CERVICAL SPINE - 2-3 VIEW COMPARISON:  December 08, 2003. FINDINGS: Status post surgical anterior fusion of C5-6 and C6-7. No acute fracture or spondylolisthesis is noted. Large anterior osteophyte formation is noted at C4-5. No prevertebral soft tissue swelling is noted. IMPRESSION: Postsurgical and degenerative changes as described above. No acute abnormality seen. Electronically Signed   By: Lupita Raider M.D.   On: 11/30/2022 16:14   DG Chest 2 View  Result Date: 11/30/2022 CLINICAL DATA:  Chest pain. EXAM: CHEST - 2 VIEW COMPARISON:  March 19, 2021. FINDINGS: Stable cardiomediastinal silhouette. Large lobulated left upper lobe mass is noted concerning for malignancy. Right lung is clear. Bony thorax is unremarkable. IMPRESSION: Left upper lobe density is noted most consistent with left upper lobe mass or malignancy with associated atelectasis. CT scan of the chest with intravenous contrast is recommended for further evaluation. Electronically Signed   By: Lupita Raider M.D.   On: 11/30/2022 16:11    Labs:  CBC: Recent Labs    10/28/22 1501 11/30/22 1536 12/01/22 0901 12/03/22 2300  WBC 11.6* 14.1* 11.5* 14.7*  HGB 13.1 12.5* 12.8* 12.0*  HCT 40.1 38.9* 38.7* 36.6*  PLT 363 389 421* 381    COAGS: Recent Labs    11/30/22 1536  INR 1.2    BMP: Recent Labs    10/28/22 1501 11/30/22 1536 12/01/22 0901 12/03/22 2300  NA 137 135 133* 130*  K 4.4 4.7 4.4 3.9  CL 100 100 97* 94*  CO2 21 26 24 27   GLUCOSE 89 97 130* 107*  BUN 10 15 13 14   CALCIUM 10.0 9.9 10.3 9.9  CREATININE 0.72* 0.82 0.66  0.78  GFRNONAA  --  >60 >60 >60    LIVER FUNCTION TESTS: Recent Labs    10/28/22 1501 12/01/22 0901  BILITOT 0.3  --   AST 19  --   ALT 22  --   ALKPHOS 91  --   PROT 7.2  --   ALBUMIN 4.1 3.4*    TUMOR MARKERS: No results for input(s): "AFPTM", "CEA", "CA199", "CHROMGRNA" in the last 8760 hours.  Assessment and Plan:  New diagnosis of lung cancer.  Will proceed with placement of a Port A Cath today by Dr. Deanne Coffer.  Risks and benefits of image guided port-a-catheter placement was discussed with the patient including, but not limited to bleeding, infection, pneumothorax, or fibrin sheath development and need for additional procedures.  All of the patient's questions were answered, patient is agreeable to proceed. Consent signed and in chart.  Thank you for allowing our service to participate in Rodney Mahoney 's care.  Electronically Signed: Gwynneth Macleod, PA-C   12/23/2022, 12:58 PM      I spent a total of  30 Minutes  in face to face in clinical consultation, greater than 50% of which was counseling/coordinating care for [facility] A Cath.

## 2022-12-23 NOTE — Progress Notes (Signed)
Pharmacist Chemotherapy Monitoring - Initial Assessment    Anticipated start date: 12/25/22   The following has been reviewed per standard work regarding the patient's treatment regimen: The patient's diagnosis, treatment plan and drug doses, and organ/hematologic function Lab orders and baseline tests specific to treatment regimen  The treatment plan start date, drug sequencing, and pre-medications Prior authorization status  Patient's documented medication list, including drug-drug interaction screen and prescriptions for anti-emetics and supportive care specific to the treatment regimen The drug concentrations, fluid compatibility, administration routes, and timing of the medications to be used The patient's access for treatment and lifetime cumulative dose history, if applicable  The patient's medication allergies and previous infusion related reactions, if applicable   Changes made to treatment plan:  N/A  Follow up needed:  N/A   Rodney Mahoney, [facility], 12/23/2022  3:03 PM

## 2022-12-24 ENCOUNTER — Other Ambulatory Visit: Payer: Self-pay

## 2022-12-24 ENCOUNTER — Inpatient Hospital Stay: Payer: 59

## 2022-12-24 ENCOUNTER — Other Ambulatory Visit (HOSPITAL_COMMUNITY): Payer: 59

## 2022-12-24 ENCOUNTER — Ambulatory Visit
Admission: RE | Admit: 2022-12-24 | Discharge: 2022-12-24 | Disposition: A | Discharge status: Home / Self Care | Payer: 59 | Source: Ambulatory Visit | Attending: Radiation Oncology | Admitting: Radiation Oncology

## 2022-12-24 DIAGNOSIS — Z923 Personal history of irradiation: Secondary | ICD-10-CM | POA: Diagnosis not present

## 2022-12-24 DIAGNOSIS — I871 Compression of vein: Secondary | ICD-10-CM | POA: Diagnosis not present

## 2022-12-24 DIAGNOSIS — R079 Chest pain, unspecified: Secondary | ICD-10-CM | POA: Diagnosis not present

## 2022-12-24 DIAGNOSIS — C3492 Malignant neoplasm of unspecified part of left bronchus or lung: Secondary | ICD-10-CM

## 2022-12-24 DIAGNOSIS — K209 Esophagitis, unspecified without bleeding: Secondary | ICD-10-CM | POA: Diagnosis not present

## 2022-12-24 DIAGNOSIS — Z5112 Encounter for antineoplastic immunotherapy: Secondary | ICD-10-CM | POA: Diagnosis not present

## 2022-12-24 DIAGNOSIS — F17210 Nicotine dependence, cigarettes, uncomplicated: Secondary | ICD-10-CM | POA: Diagnosis not present

## 2022-12-24 DIAGNOSIS — Z510 Encounter for antineoplastic radiation therapy: Secondary | ICD-10-CM | POA: Diagnosis not present

## 2022-12-24 DIAGNOSIS — I10 Essential (primary) hypertension: Secondary | ICD-10-CM | POA: Diagnosis not present

## 2022-12-24 DIAGNOSIS — C3412 Malignant neoplasm of upper lobe, left bronchus or lung: Secondary | ICD-10-CM | POA: Diagnosis not present

## 2022-12-24 DIAGNOSIS — Z79630 Long term (current) use of alkylating agent: Secondary | ICD-10-CM | POA: Diagnosis not present

## 2022-12-24 DIAGNOSIS — Z5111 Encounter for antineoplastic chemotherapy: Secondary | ICD-10-CM | POA: Diagnosis not present

## 2022-12-24 DIAGNOSIS — Z79633 Long term (current) use of mitotic inhibitor: Secondary | ICD-10-CM | POA: Diagnosis not present

## 2022-12-24 DIAGNOSIS — Z79620 Long term (current) use of immunosuppressive biologic: Secondary | ICD-10-CM | POA: Diagnosis not present

## 2022-12-24 DIAGNOSIS — Z809 Family history of malignant neoplasm, unspecified: Secondary | ICD-10-CM | POA: Diagnosis not present

## 2022-12-24 LAB — RAD ONC ARIA SESSION SUMMARY
Course Elapsed Days: 9
Plan Fractions Treated to Date: 3
Plan Prescribed Dose Per Fraction: 3 Gy
Plan Total Fractions Prescribed: 6
Plan Total Prescribed Dose: 18 Gy
Reference Point Dosage Given to Date: 21 Gy
Reference Point Session Dosage Given: 3 Gy
Session Number: 7

## 2022-12-25 ENCOUNTER — Other Ambulatory Visit: Payer: Self-pay

## 2022-12-25 ENCOUNTER — Inpatient Hospital Stay: Payer: 59

## 2022-12-25 ENCOUNTER — Ambulatory Visit
Admission: RE | Admit: 2022-12-25 | Discharge: 2022-12-25 | Disposition: A | Discharge status: Home / Self Care | Payer: 59 | Source: Ambulatory Visit | Attending: Radiation Oncology | Admitting: Radiation Oncology

## 2022-12-25 VITALS — BP 114/60 | HR 90 | Temp 98.0°F | Resp 16

## 2022-12-25 DIAGNOSIS — Z510 Encounter for antineoplastic radiation therapy: Secondary | ICD-10-CM | POA: Diagnosis not present

## 2022-12-25 DIAGNOSIS — R079 Chest pain, unspecified: Secondary | ICD-10-CM | POA: Diagnosis not present

## 2022-12-25 DIAGNOSIS — C3492 Malignant neoplasm of unspecified part of left bronchus or lung: Secondary | ICD-10-CM

## 2022-12-25 DIAGNOSIS — F17210 Nicotine dependence, cigarettes, uncomplicated: Secondary | ICD-10-CM | POA: Diagnosis not present

## 2022-12-25 DIAGNOSIS — I10 Essential (primary) hypertension: Secondary | ICD-10-CM | POA: Diagnosis not present

## 2022-12-25 DIAGNOSIS — Z79633 Long term (current) use of mitotic inhibitor: Secondary | ICD-10-CM | POA: Diagnosis not present

## 2022-12-25 DIAGNOSIS — I871 Compression of vein: Secondary | ICD-10-CM | POA: Diagnosis not present

## 2022-12-25 DIAGNOSIS — Z923 Personal history of irradiation: Secondary | ICD-10-CM | POA: Diagnosis not present

## 2022-12-25 DIAGNOSIS — Z79630 Long term (current) use of alkylating agent: Secondary | ICD-10-CM | POA: Diagnosis not present

## 2022-12-25 DIAGNOSIS — K209 Esophagitis, unspecified without bleeding: Secondary | ICD-10-CM | POA: Diagnosis not present

## 2022-12-25 DIAGNOSIS — C3412 Malignant neoplasm of upper lobe, left bronchus or lung: Secondary | ICD-10-CM | POA: Diagnosis not present

## 2022-12-25 DIAGNOSIS — Z5111 Encounter for antineoplastic chemotherapy: Secondary | ICD-10-CM | POA: Diagnosis not present

## 2022-12-25 DIAGNOSIS — Z79620 Long term (current) use of immunosuppressive biologic: Secondary | ICD-10-CM | POA: Diagnosis not present

## 2022-12-25 DIAGNOSIS — Z5112 Encounter for antineoplastic immunotherapy: Secondary | ICD-10-CM | POA: Diagnosis not present

## 2022-12-25 DIAGNOSIS — Z809 Family history of malignant neoplasm, unspecified: Secondary | ICD-10-CM | POA: Diagnosis not present

## 2022-12-25 LAB — COMPREHENSIVE METABOLIC PANEL
ALT: 21 U/L (ref 0–44)
AST: 15 U/L (ref 15–41)
Albumin: 3 g/dL — ABNORMAL LOW (ref 3.5–5.0)
Alkaline Phosphatase: 66 U/L (ref 38–126)
Anion gap: 10 (ref 5–15)
BUN: 16 mg/dL (ref 8–23)
CO2: 23 mmol/L (ref 22–32)
Calcium: 8.6 mg/dL — ABNORMAL LOW (ref 8.9–10.3)
Chloride: 100 mmol/L (ref 98–111)
Creatinine, Ser: 0.69 mg/dL (ref 0.61–1.24)
GFR, Estimated: >60 mL/min (ref >60)
Glucose, Bld: 137 mg/dL — ABNORMAL HIGH (ref 70–99)
Potassium: 3.8 mmol/L (ref 3.5–5.1)
Sodium: 133 mmol/L — ABNORMAL LOW (ref 135–145)
Total Bilirubin: 0.4 mg/dL (ref 0.3–1.2)
Total Protein: 6.9 g/dL (ref 6.5–8.1)

## 2022-12-25 LAB — CBC WITH DIFFERENTIAL/PLATELET
Abs Immature Granulocytes: 0.05 10*3/uL (ref 0.00–0.07)
Basophils Absolute: 0 10*3/uL (ref 0.0–0.1)
Basophils Relative: 0 %
Eosinophils Absolute: 0.2 10*3/uL (ref 0.0–0.5)
Eosinophils Relative: 2 %
HCT: 30.4 % — ABNORMAL LOW (ref 39.0–52.0)
Hemoglobin: 9.9 g/dL — ABNORMAL LOW (ref 13.0–17.0)
Immature Granulocytes: 1 %
Lymphocytes Relative: 8 %
Lymphs Abs: 0.8 10*3/uL (ref 0.7–4.0)
MCH: 28.4 pg (ref 26.0–34.0)
MCHC: 32.6 g/dL (ref 30.0–36.0)
MCV: 87.1 fL (ref 80.0–100.0)
Monocytes Absolute: 1.1 10*3/uL — ABNORMAL HIGH (ref 0.1–1.0)
Monocytes Relative: 12 %
Neutro Abs: 7.5 10*3/uL (ref 1.7–7.7)
Neutrophils Relative %: 77 %
Platelets: 393 10*3/uL (ref 150–400)
RBC: 3.49 MIL/uL — ABNORMAL LOW (ref 4.22–5.81)
RDW: 14.3 % (ref 11.5–15.5)
WBC: 9.6 10*3/uL (ref 4.0–10.5)
nRBC: 0 % (ref 0.0–0.2)

## 2022-12-25 LAB — RAD ONC ARIA SESSION SUMMARY
Course Elapsed Days: 10
Plan Fractions Treated to Date: 4
Plan Prescribed Dose Per Fraction: 3 Gy
Plan Total Fractions Prescribed: 6
Plan Total Prescribed Dose: 18 Gy
Reference Point Dosage Given to Date: 24 Gy
Reference Point Session Dosage Given: 3 Gy
Session Number: 8

## 2022-12-25 LAB — MAGNESIUM: Magnesium: 2.2 mg/dL (ref 1.7–2.4)

## 2022-12-25 MED ORDER — SODIUM CHLORIDE 0.9 % IV SOLN
10.0000 mg | Freq: Once | INTRAVENOUS | Status: AC
  Administered 2022-12-25: 10 mg via INTRAVENOUS
  Filled 2022-12-25: qty 10

## 2022-12-25 MED ORDER — HEPARIN SOD (PORK) LOCK FLUSH 100 UNIT/ML IV SOLN
500.0000 [IU] | Freq: Once | INTRAVENOUS | Status: AC | PRN
  Administered 2022-12-25: 500 [IU]

## 2022-12-25 MED ORDER — SODIUM CHLORIDE 0.9% FLUSH
10.0000 mL | INTRAVENOUS | Status: DC | PRN
  Administered 2022-12-25: 10 mL

## 2022-12-25 MED ORDER — PALONOSETRON HCL INJECTION 0.25 MG/5ML
0.2500 mg | Freq: Once | INTRAVENOUS | Status: AC
  Administered 2022-12-25: 0.25 mg via INTRAVENOUS
  Filled 2022-12-25: qty 5

## 2022-12-25 MED ORDER — SODIUM CHLORIDE 0.9 % IV SOLN: Freq: Once | INTRAVENOUS | Status: AC

## 2022-12-25 MED ORDER — FAMOTIDINE IN NACL 20-0.9 MG/50ML-% IV SOLN
20.0000 mg | Freq: Once | INTRAVENOUS | Status: AC
  Administered 2022-12-25: 20 mg via INTRAVENOUS
  Filled 2022-12-25: qty 50

## 2022-12-25 MED ORDER — CETIRIZINE HCL 10 MG/ML IV SOLN
10.0000 mg | Freq: Once | INTRAVENOUS | Status: AC
  Administered 2022-12-25: 10 mg via INTRAVENOUS
  Filled 2022-12-25: qty 1

## 2022-12-25 MED ORDER — SODIUM CHLORIDE 0.9 % IV SOLN
272.4000 mg | Freq: Once | INTRAVENOUS | Status: AC
  Administered 2022-12-25: 270 mg via INTRAVENOUS
  Filled 2022-12-25: qty 27

## 2022-12-25 MED ORDER — SODIUM CHLORIDE 0.9 % IV SOLN
50.0000 mg/m2 | Freq: Once | INTRAVENOUS | Status: AC
  Administered 2022-12-25: 96 mg via INTRAVENOUS
  Filled 2022-12-25: qty 16

## 2022-12-25 NOTE — Progress Notes (Signed)
Patient presents today for chemotherapy infusion of Taxol and Carboplatin.  Patient is in satisfactory condition with no new complaints voiced.  Vital signs are stable.  Labs reviewed and all labs are within treatment parameters.  We will proceed with treatment per MD orders.    Patient tolerated treatment well with no complaints voiced.  Patient left ambulatory in stable condition.  Vital signs stable at discharge.  Follow up as scheduled.

## 2022-12-25 NOTE — Patient Instructions (Signed)
[facility]  Discharge Instructions: Thank you for choosing [facility] to provide your oncology and hematology care.  If you have a lab appointment with the Cancer Center - please note that after April 8th, 2024, all labs will be drawn in the cancer center.  You do not have to check in or register with the main entrance as you have in the past but will complete your check-in in the cancer center.  Wear comfortable clothing and clothing appropriate for easy access to any Portacath or PICC line.   We strive to give you quality time with your provider. You may need to reschedule your appointment if you arrive late (15 or more minutes).  Arriving late affects you and other patients whose appointments are after yours.  Also, if you miss three or more appointments without notifying the office, you may be dismissed from the clinic at the provider's discretion.      For prescription refill requests, have your pharmacy contact our office and allow 72 hours for refills to be completed.    Today you received the following chemotherapy and/or immunotherapy agents Taxol and Carboplatin.  Carboplatin Injection What is this medication? CARBOPLATIN (KAR boe pla tin) treats some types of cancer. It works by slowing down the growth of cancer cells. This medicine may be used for other purposes; ask your health care provider or pharmacist if you have questions. COMMON BRAND NAME(S): Paraplatin What should I tell my care team before I take this medication? They need to know if you have any of these conditions: Blood disorders Hearing problems Kidney disease Recent or ongoing radiation therapy An unusual or allergic reaction to carboplatin, cisplatin, other medications, foods, dyes, or preservatives Pregnant or trying to get pregnant Breast-feeding How should I use this medication? This medication is injected into a vein. It is given by your care team in a hospital or clinic  setting. Talk to your care team about the use of this medication in children. Special care may be needed. Overdosage: If you think you have taken too much of this medicine contact a poison control center or emergency room at once. NOTE: This medicine is only for you. Do not share this medicine with others. What if I miss a dose? Keep appointments for follow-up doses. It is important not to miss your dose. Call your care team if you are unable to keep an appointment. What may interact with this medication? Medications for seizures Some antibiotics, such as amikacin, gentamicin, neomycin, streptomycin, tobramycin Vaccines This list may not describe all possible interactions. Give your health care provider a list of all the medicines, herbs, non-prescription drugs, or dietary supplements you use. Also tell them if you smoke, drink alcohol, or use illegal drugs. Some items may interact with your medicine. What should I watch for while using this medication? Your condition will be monitored carefully while you are receiving this medication. You may need blood work while taking this medication. This medication may make you feel generally unwell. This is not uncommon, as chemotherapy can affect healthy cells as well as cancer cells. Report any side effects. Continue your course of treatment even though you feel ill unless your care team tells you to stop. In some cases, you may be given additional medications to help with side effects. Follow all directions for their use. This medication may increase your risk of getting an infection. Call your care team for advice if you get a fever, chills, sore throat,  or other symptoms of a cold or flu. Do not treat yourself. Try to avoid being around people who are sick. Avoid taking medications that contain aspirin, acetaminophen, ibuprofen, naproxen, or ketoprofen unless instructed by your care team. These medications may hide a fever. Be careful brushing or  flossing your teeth or using a toothpick because you may get an infection or bleed more easily. If you have any dental work done, tell your dentist you are receiving this medication. Talk to your care team if you wish to become pregnant or think you might be pregnant. This medication can cause serious birth defects. Talk to your care team about effective forms of contraception. Do not breast-feed while taking this medication. What side effects may I notice from receiving this medication? Side effects that you should report to your care team as soon as possible: Allergic reactions--skin rash, itching, hives, swelling of the face, lips, tongue, or throat Infection--fever, chills, cough, sore throat, wounds that don't heal, pain or trouble when passing urine, general feeling of discomfort or being unwell Low red blood cell level--unusual weakness or fatigue, dizziness, headache, trouble breathing Pain, tingling, or numbness in the hands or feet, muscle weakness, change in vision, confusion or trouble speaking, loss of balance or coordination, trouble walking, seizures Unusual bruising or bleeding Side effects that usually do not require medical attention (report to your care team if they continue or are bothersome): Hair loss Nausea Unusual weakness or fatigue Vomiting This list may not describe all possible side effects. Call your doctor for medical advice about side effects. You may report side effects to FDA at 1-800-FDA-1088. Where should I keep my medication? This medication is given in a hospital or clinic. It will not be stored at home. NOTE: This sheet is a summary. It may not cover all possible information. If you have questions about this medicine, talk to your doctor, pharmacist, or health care provider.  2024 Elsevier/Gold Standard (2021-09-23 00:00:00) Paclitaxel Injection What is this medication? PACLITAXEL (PAK li TAX el) treats some types of cancer. It works by slowing down the  growth of cancer cells. This medicine may be used for other purposes; ask your health care provider or pharmacist if you have questions. COMMON BRAND NAME(S): Onxol, Taxol What should I tell my care team before I take this medication? They need to know if you have any of these conditions: Heart disease Liver disease Low white blood cell levels An unusual or allergic reaction to paclitaxel, other medications, foods, dyes, or preservatives If you or your partner are pregnant or trying to get pregnant Breast-feeding How should I use this medication? This medication is injected into a vein. It is given by your care team in a hospital or clinic setting. Talk to your care team about the use of this medication in children. While it may be given to children for selected conditions, precautions do apply. Overdosage: If you think you have taken too much of this medicine contact a poison control center or emergency room at once. NOTE: This medicine is only for you. Do not share this medicine with others. What if I miss a dose? Keep appointments for follow-up doses. It is important not to miss your dose. Call your care team if you are unable to keep an appointment. What may interact with this medication? Do not take this medication with any of the following: Live virus vaccines Other medications may affect the way this medication works. Talk with your care team about all of the  medications you take. They may suggest changes to your treatment plan to lower the risk of side effects and to make sure your medications work as intended. This list may not describe all possible interactions. Give your health care provider a list of all the medicines, herbs, non-prescription drugs, or dietary supplements you use. Also tell them if you smoke, drink alcohol, or use illegal drugs. Some items may interact with your medicine. What should I watch for while using this medication? Your condition will be monitored  carefully while you are receiving this medication. You may need blood work while taking this medication. This medication may make you feel generally unwell. This is not uncommon as chemotherapy can affect healthy cells as well as cancer cells. Report any side effects. Continue your course of treatment even though you feel ill unless your care team tells you to stop. This medication can cause serious allergic reactions. To reduce the risk, your care team may give you other medications to take before receiving this one. Be sure to follow the directions from your care team. This medication may increase your risk of getting an infection. Call your care team for advice if you get a fever, chills, sore throat, or other symptoms of a cold or flu. Do not treat yourself. Try to avoid being around people who are sick. This medication may increase your risk to bruise or bleed. Call your care team if you notice any unusual bleeding. Be careful brushing or flossing your teeth or using a toothpick because you may get an infection or bleed more easily. If you have any dental work done, tell your dentist you are receiving this medication. Talk to your care team if you may be pregnant. Serious birth defects can occur if you take this medication during pregnancy. Talk to your care team before breastfeeding. Changes to your treatment plan may be needed. What side effects may I notice from receiving this medication? Side effects that you should report to your care team as soon as possible: Allergic reactions--skin rash, itching, hives, swelling of the face, lips, tongue, or throat Heart rhythm changes--fast or irregular heartbeat, dizziness, feeling faint or lightheaded, chest pain, trouble breathing Increase in blood pressure Infection--fever, chills, cough, sore throat, wounds that don't heal, pain or trouble when passing urine, general feeling of discomfort or being unwell Low blood pressure--dizziness, feeling faint  or lightheaded, blurry vision Low red blood cell level--unusual weakness or fatigue, dizziness, headache, trouble breathing Painful swelling, warmth, or redness of the skin, blisters or sores at the infusion site Pain, tingling, or numbness in the hands or feet Slow heartbeat--dizziness, feeling faint or lightheaded, confusion, trouble breathing, unusual weakness or fatigue Unusual bruising or bleeding Side effects that usually do not require medical attention (report to your care team if they continue or are bothersome): Diarrhea Hair loss Joint pain Loss of appetite Muscle pain Nausea Vomiting This list may not describe all possible side effects. Call your doctor for medical advice about side effects. You may report side effects to FDA at 1-800-FDA-1088. Where should I keep my medication? This medication is given in a hospital or clinic. It will not be stored at home. NOTE: This sheet is a summary. It may not cover all possible information. If you have questions about this medicine, talk to your doctor, pharmacist, or health care provider.  2024 Elsevier/Gold Standard (2021-10-21 00:00:00)       To help prevent nausea and vomiting after your treatment, we encourage you to take your  nausea medication as directed.  BELOW ARE SYMPTOMS THAT SHOULD BE REPORTED IMMEDIATELY: *FEVER GREATER THAN 100.4 F (38 C) OR HIGHER *CHILLS OR SWEATING *NAUSEA AND VOMITING THAT IS NOT CONTROLLED WITH YOUR NAUSEA MEDICATION *UNUSUAL SHORTNESS OF BREATH *UNUSUAL BRUISING OR BLEEDING *URINARY PROBLEMS (pain or burning when urinating, or frequent urination) *BOWEL PROBLEMS (unusual diarrhea, constipation, pain near the anus) TENDERNESS IN MOUTH AND THROAT WITH OR WITHOUT PRESENCE OF ULCERS (sore throat, sores in mouth, or a toothache) UNUSUAL RASH, SWELLING OR PAIN  UNUSUAL VAGINAL DISCHARGE OR ITCHING   Items with * indicate a potential emergency and should be followed up as soon as possible or go to  the Emergency Department if any problems should occur.  Please show the CHEMOTHERAPY ALERT CARD or IMMUNOTHERAPY ALERT CARD at check-in to the Emergency Department and triage nurse.  Should you have questions after your visit or need to cancel or reschedule your appointment, please contact [facility] [phone]  and follow the prompts.  Office hours are 8:00 a.m. to 4:30 p.m. Monday - Friday. Please note that voicemails left after 4:00 p.m. may not be returned until the following business day.  We are closed weekends and major holidays. You have access to a nurse at all times for urgent questions. Please call the main number to the clinic [phone] and follow the prompts.  For any non-urgent questions, you may also contact your provider using MyChart. We now offer e-Visits for anyone 45 and older to request care online for non-urgent symptoms. For details visit [email or web address].   Also download the MyChart app! Go to the app store, search "MyChart", open the app, select [place], and log in with your MyChart username and password.

## 2022-12-28 ENCOUNTER — Other Ambulatory Visit: Payer: Self-pay

## 2022-12-28 ENCOUNTER — Inpatient Hospital Stay: Payer: 59 | Admitting: Dietician

## 2022-12-28 ENCOUNTER — Encounter: Payer: Self-pay | Admitting: Hematology

## 2022-12-28 ENCOUNTER — Telehealth: Payer: Self-pay | Admitting: Dietician

## 2022-12-28 ENCOUNTER — Ambulatory Visit
Admission: RE | Admit: 2022-12-28 | Discharge: 2022-12-28 | Disposition: A | Discharge status: Home / Self Care | Payer: 59 | Source: Ambulatory Visit | Attending: Pulmonary Disease | Admitting: Pulmonary Disease

## 2022-12-28 ENCOUNTER — Ambulatory Visit
Admission: RE | Admit: 2022-12-28 | Discharge: 2022-12-28 | Disposition: A | Discharge status: Home / Self Care | Payer: 59 | Source: Ambulatory Visit | Attending: Radiation Oncology | Admitting: Radiation Oncology

## 2022-12-28 DIAGNOSIS — R079 Chest pain, unspecified: Secondary | ICD-10-CM | POA: Diagnosis not present

## 2022-12-28 DIAGNOSIS — Z510 Encounter for antineoplastic radiation therapy: Secondary | ICD-10-CM | POA: Diagnosis not present

## 2022-12-28 DIAGNOSIS — K209 Esophagitis, unspecified without bleeding: Secondary | ICD-10-CM | POA: Diagnosis not present

## 2022-12-28 DIAGNOSIS — F17210 Nicotine dependence, cigarettes, uncomplicated: Secondary | ICD-10-CM | POA: Diagnosis not present

## 2022-12-28 DIAGNOSIS — I6389 Other cerebral infarction: Secondary | ICD-10-CM | POA: Diagnosis not present

## 2022-12-28 DIAGNOSIS — C3490 Malignant neoplasm of unspecified part of unspecified bronchus or lung: Secondary | ICD-10-CM | POA: Diagnosis not present

## 2022-12-28 DIAGNOSIS — Z79630 Long term (current) use of alkylating agent: Secondary | ICD-10-CM | POA: Diagnosis not present

## 2022-12-28 DIAGNOSIS — I6782 Cerebral ischemia: Secondary | ICD-10-CM | POA: Diagnosis not present

## 2022-12-28 DIAGNOSIS — Z5111 Encounter for antineoplastic chemotherapy: Secondary | ICD-10-CM | POA: Diagnosis not present

## 2022-12-28 DIAGNOSIS — I871 Compression of vein: Secondary | ICD-10-CM | POA: Diagnosis not present

## 2022-12-28 DIAGNOSIS — Z79620 Long term (current) use of immunosuppressive biologic: Secondary | ICD-10-CM | POA: Diagnosis not present

## 2022-12-28 DIAGNOSIS — Z809 Family history of malignant neoplasm, unspecified: Secondary | ICD-10-CM | POA: Diagnosis not present

## 2022-12-28 DIAGNOSIS — Z1289 Encounter for screening for malignant neoplasm of other sites: Secondary | ICD-10-CM | POA: Diagnosis not present

## 2022-12-28 DIAGNOSIS — Z5112 Encounter for antineoplastic immunotherapy: Secondary | ICD-10-CM | POA: Diagnosis not present

## 2022-12-28 DIAGNOSIS — C3412 Malignant neoplasm of upper lobe, left bronchus or lung: Secondary | ICD-10-CM | POA: Diagnosis not present

## 2022-12-28 DIAGNOSIS — I10 Essential (primary) hypertension: Secondary | ICD-10-CM | POA: Diagnosis not present

## 2022-12-28 DIAGNOSIS — Z923 Personal history of irradiation: Secondary | ICD-10-CM | POA: Diagnosis not present

## 2022-12-28 DIAGNOSIS — Z79633 Long term (current) use of mitotic inhibitor: Secondary | ICD-10-CM | POA: Diagnosis not present

## 2022-12-28 LAB — RAD ONC ARIA SESSION SUMMARY
Course Elapsed Days: 13
Plan Fractions Treated to Date: 5
Plan Prescribed Dose Per Fraction: 3 Gy
Plan Total Fractions Prescribed: 6
Plan Total Prescribed Dose: 18 Gy
Reference Point Dosage Given to Date: 27 Gy
Reference Point Session Dosage Given: 3 Gy
Session Number: 9

## 2022-12-28 MED ORDER — GADOPICLENOL 0.5 MMOL/ML IV SOLN
9.0000 mL | Freq: Once | INTRAVENOUS | Status: AC | PRN
  Administered 2022-12-28: 9 mL via INTRAVENOUS

## 2022-12-28 NOTE — Telephone Encounter (Signed)
Patient did not answer for scheduled nutrition appointment. Left VM with request for return call. Contact information provided.

## 2022-12-29 ENCOUNTER — Ambulatory Visit
Admission: RE | Admit: 2022-12-29 | Discharge: 2022-12-29 | Disposition: A | Discharge status: Home / Self Care | Payer: 59 | Source: Ambulatory Visit | Attending: Radiation Oncology | Admitting: Radiation Oncology

## 2022-12-29 ENCOUNTER — Other Ambulatory Visit: Payer: Self-pay

## 2022-12-29 ENCOUNTER — Inpatient Hospital Stay: Payer: 59 | Admitting: Hematology

## 2022-12-29 ENCOUNTER — Encounter: Payer: Self-pay | Admitting: Urology

## 2022-12-29 DIAGNOSIS — R079 Chest pain, unspecified: Secondary | ICD-10-CM | POA: Diagnosis not present

## 2022-12-29 DIAGNOSIS — F17210 Nicotine dependence, cigarettes, uncomplicated: Secondary | ICD-10-CM | POA: Diagnosis not present

## 2022-12-29 DIAGNOSIS — Z79620 Long term (current) use of immunosuppressive biologic: Secondary | ICD-10-CM | POA: Diagnosis not present

## 2022-12-29 DIAGNOSIS — K209 Esophagitis, unspecified without bleeding: Secondary | ICD-10-CM | POA: Diagnosis not present

## 2022-12-29 DIAGNOSIS — Z923 Personal history of irradiation: Secondary | ICD-10-CM | POA: Diagnosis not present

## 2022-12-29 DIAGNOSIS — I10 Essential (primary) hypertension: Secondary | ICD-10-CM | POA: Diagnosis not present

## 2022-12-29 DIAGNOSIS — Z809 Family history of malignant neoplasm, unspecified: Secondary | ICD-10-CM | POA: Diagnosis not present

## 2022-12-29 DIAGNOSIS — Z79630 Long term (current) use of alkylating agent: Secondary | ICD-10-CM | POA: Diagnosis not present

## 2022-12-29 DIAGNOSIS — Z5112 Encounter for antineoplastic immunotherapy: Secondary | ICD-10-CM | POA: Diagnosis not present

## 2022-12-29 DIAGNOSIS — Z510 Encounter for antineoplastic radiation therapy: Secondary | ICD-10-CM | POA: Diagnosis not present

## 2022-12-29 DIAGNOSIS — C3412 Malignant neoplasm of upper lobe, left bronchus or lung: Secondary | ICD-10-CM | POA: Diagnosis not present

## 2022-12-29 DIAGNOSIS — Z5111 Encounter for antineoplastic chemotherapy: Secondary | ICD-10-CM | POA: Diagnosis not present

## 2022-12-29 DIAGNOSIS — Z79633 Long term (current) use of mitotic inhibitor: Secondary | ICD-10-CM | POA: Diagnosis not present

## 2022-12-29 DIAGNOSIS — I871 Compression of vein: Secondary | ICD-10-CM | POA: Diagnosis not present

## 2022-12-29 LAB — RAD ONC ARIA SESSION SUMMARY
Course Elapsed Days: 14
Plan Fractions Treated to Date: 6
Plan Prescribed Dose Per Fraction: 3 Gy
Plan Total Fractions Prescribed: 6
Plan Total Prescribed Dose: 18 Gy
Reference Point Dosage Given to Date: 30 Gy
Reference Point Session Dosage Given: 3 Gy
Session Number: 10

## 2022-12-30 ENCOUNTER — Telehealth: Payer: Self-pay

## 2022-12-30 ENCOUNTER — Ambulatory Visit: Payer: 59

## 2022-12-30 ENCOUNTER — Encounter: Payer: Self-pay | Admitting: Hematology

## 2022-12-30 LAB — FUNGUS CULTURE WITH STAIN

## 2022-12-30 LAB — FUNGAL ORGANISM REFLEX

## 2022-12-30 LAB — FUNGUS CULTURE RESULT

## 2022-12-30 NOTE — Radiation Completion Notes (Addendum)
Radiation Oncology         [phone] ________________________________  Name: Rodney Mahoney MRN: [id number]  Date: 12/30/2022  DOB: 05/02/1961  Referring Physician: Christel Mormon, M.D. Date of Service: 2022-12-30 Radiation Oncologist: Margaretmary Bayley, M.D. 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, squamous cell carcinoma of the left upper lung  Intent: Palliative  ==========DELIVERED PLANS==========  First Treatment Date: 2022-12-15 - Last Treatment Date: 2022-12-29   Plan Name: Lung_L Site: Bronchus, Left Technique: 3D Mode: Photon Dose Per Fraction: 3 Gy Prescribed Dose (Delivered / Prescribed): 12 Gy / 12 Gy Prescribed Fxs (Delivered / Prescribed): 4 / 4   Plan Name: Lung_L:1 Site: Bronchus, Left Technique: 3D Mode: Photon Dose Per Fraction: 3 Gy Prescribed Dose (Delivered / Prescribed): 18 Gy / 18 Gy Prescribed Fxs (Delivered / Prescribed): 6 / 6     ==========ON TREATMENT VISIT DATES========== 2022-12-18, 2022-12-25    See weekly On Treatment Notes in Epic for details. The patient tolerated the treatments relatively well with only modest fatigue.  The patient will receive a call in about one month from the radiation oncology department. He will continue follow up with Dr. Ellin Saba as well.  ------------------------------------------------   Margaretmary Dys, MD [facility]  Radiation Oncology Direct Dial: [phone]  Fax: [phone] [email or web address]  Skype  LinkedIn

## 2022-12-30 NOTE — Telephone Encounter (Addendum)
Addendum note:   RN spoke with patient to follow up on concerns radiation therapist reported on 12/29/2022 and confusion about disease stage after his PET and treatment plan.  Explained he has stage IV lung cancer our plan was to give 10 radiation treatments to the chest to alleviate the obstruction from the lung tumor and then let Dr. Ellin Saba take over from there with continuation of chemotherapy to treat all of the disease.  Patient stated he was aware and that he knew he was to start chemotherapy soon and call ended.

## 2022-12-30 NOTE — Progress Notes (Signed)
The therapists called after his treatment this morning stating that he was confused about his treatment plan and agitated that no one had informed him of his disease stage following his PET. He had an MRI brain yesterday and those results are not available yet but based on the PET scan, he does have stage IV lung cancer and therefore, our plan was to give 10 radiation treatments to the chest to alleviate the obstruction from the lung tumor and then let Dr. Ellin Saba take over from there with continuation of chemotherapy to treat all of the disease.  The PET showed:  IMPRESSION:  1. Large LEFT upper lobe mass consistent with primary bronchogenic  carcinoma. Direct invasion of the AP window and LEFT superior hilum.  2. Extensive metastatic adenopathy to the AP window, contralateral  paratracheal nodes as well as bilateral supraclavicular nodes. Bulky  LEFT supraclavicular disease.  3. Direct skeletal invasion of the manubrium and first RIGHT rib  costamanubrial junction  4. Soft tissue extension into the medial border of the overlying  RIGHT pectoralis muscle.  5. Discrete hypermetabolic RIGHT upper lobe pulmonary nodule  consistent with metastatic disease.  6. No evidence metastatic disease in the abdomen pelvis.   He is finished with the planned palliative chest radiation and should continue in routine follow up under the care and direction of Dr. Ellin Saba for continued systemic therapy. I will have Chesley Noon, RN call patient to provide clarification regarding the treatment plan/goals and identify/address any further questions he may have, to ensure that he is comfortable with the plan. We would be more than happy to continue to participate in his care if clinically indicated in the future.  Marguarite Arbour, MMS, PA-C [facility] Radiation Oncology Physician Assistant Direct Dial: [phone]  Fax: [phone]

## 2022-12-30 NOTE — Progress Notes (Signed)
[name] [address] [place] [address]   Clinic Day:  12/31/2022  Referring physician: Billie Lade, MD  Patient Care Team: Billie Lade, MD as PCP - General (Internal Medicine) [name], MD as PCP - Cardiology (Cardiology) [name], MD as Consulting Physician (Internal Medicine) [name], RN as Oncology Nurse Navigator (Medical Oncology) [name], MD as Medical Oncologist (Medical Oncology)   ASSESSMENT & PLAN:   Assessment:  1.  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  No weight loss or hemoptysis. - CT angio chest (11/30/2022): Left upper lobe mass with central necrosis and bulky mediastinal, left hilar and left lower neck adenopathy and lytic lesion in the right superior sternum. - Bronchoscopy (12/01/2022): Narrowing of LUL apical and posterior segment.  Otherwise normal-appearing bronchial trees. - LUL upper lobe biopsy (12/01/2022): Invasive moderately differentiated squamous cell carcinoma. - PET scan (12/16/2022): Left supraclavicular 3.2 cm mass, direct extension from mediastinal adenopathy.  Small right supraclavicular lymph node.  Large left lung mass 7.4 x 6.1 cm extending into the left hilum and AP window.  Prevascular and paratracheal lymph nodes and right paratracheal node.  Extensive involvement of sternum manubrium and first rib close to manubrium.  Soft tissue extension into the medial border of the overlying pectoralis muscle.  Discrete hypermetabolic right upper lobe nodule 10 mm.  No abdominal or pelvic metastatic disease. - He has received 1 dose of concurrent chemo XRT on 12/23/2022. - Left lung XRT 12 Gray in 4 fractions and left bronchus radiation 18 Gray in 6 fractions completed  2.  Social/family history: - Lives at home with his wife.  He is independent of ADLs and IADLs.  Retired from work in April  2024.  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  Current active smoker, 1 pack/day started at age 69 and quit at the time of diagnosis. - Sister had cancer.  2 paternal cousins had cancer.   Plan:  1.  Stage IV (T4 N2 M1) squamous cell lung cancer: - He has completed radiation to the lung and bronchus. - I have reviewed MRI of the brain from 12/28/2022: No evidence of intracranial metastatic disease. - Reviewed labs today: Normal LFTs and creatinine.  CBC grossly normal with hemoglobin improved to 11.2. - He reported having sore throat since last week with difficulty and painful swallowing.  This started the next day after chemotherapy.  We will prescribe Maalox with lidocaine to be taken prior to eating. - We discussed palliative chemoimmunotherapy based on POSEIDON trial with carboplatin and paclitaxel, double immunotherapy with tremelimumab and Durvalumab. - We will obtain NGS test results prior to starting treatment.  He is currently stable from his disease.  This will also give him time to recover from his esophagitis. - We will also send Guardant360 testing today.  Tissue NGS results were already sent. - Tentatively will start his treatment on 01/11/2023.  2.  Chest and neck tightness/pain: - Continue Norco 7.5/325 2 to 3 tablets daily.  Pain is well-controlled.  3.  Hypertension: - At last visit we held his lisinopril and Toprol-XL.  Blood pressure today is 120/80.   No orders of the defined types were placed in this encounter.     [name],acting as a Neurosurgeon for [name], MD.,have documented all relevant documentation on the behalf of [name], MD,as directed by  [name], MD while in the presence of [name], MD.  I, [name] MD, have reviewed the above documentation for accuracy and completeness, and I agree with the above.    [name], MD   7/18/20246:11 PM  CHIEF COMPLAINT/PURPOSE OF  CONSULT:   Diagnosis: Squamous Cell Carcinoma of Left Lung   Cancer Staging  Squamous cell lung cancer, left (HCC) Staging form: Lung, AJCC 8th Edition - Clinical stage from 12/22/2022: Stage IVA (cT4, cN3, cM1a) - Unsigned    Prior Therapy: none  Current Therapy: Chemoradiation therapy   HISTORY OF PRESENT ILLNESS:   Oncology History  Squamous cell lung cancer, left (HCC)  12/22/2022 Initial Diagnosis   Squamous cell lung cancer, left (HCC)   12/25/2022 -  Chemotherapy   Patient is on Treatment Plan : ESOPHAGUS Carboplatin + Paclitaxel Weekly X 6 Weeks with XRT         Rodney Mahoney is a 62 y.o. male presenting to clinic today for evaluation of Squamous Cell Carcinoma of Left Lung at the request of [facility].  Patient underwent an NM PET Skull Base to Thigh on 7/3 that showed large LEFT upper lobe mass consistent with primary bronchogenic carcinoma, extensive metastatic adenopathy to the AP window, bulky LEFT supraclavicular disease, direct invasion of the AP window and LEFT superior hilum, and direct skeletal invasion of the manubrium and first RIGHT rib costamanubrial junction.   Patient was seen in the ED at [facility] on 6/20 for chest pain and occasionally coughing up blood. CT of patient showed negative for PE. Symptoms improved on their own and patient was discharged.   Today, he states that he is doing well overall. His appetite level is at 70%. His energy level is at 60%.  INTERVAL HISTORY:   Rodney Mahoney is a 62 y.o. male is presenting to the clinic today for follow-up of Squamous Cell Carcinoma of Left Lung. He was last seen by me on 12/22/22.  He had a port inserted on 7/10.   Today, he states that he is doing well overall. His appetite level is at 25%. His energy level is at 75%.   PAST MEDICAL HISTORY:   Past Medical History: Past Medical History:  Diagnosis Date   Bronchitis    CAD (coronary artery disease)    a. cath 11/26/2014 EF 45%, moderate to severe  inferior hypokinesis, 90% small D1, 100% mid to distal RCA with minimal collateral, 40% mid to distal LCx. Medical therapy as RCA occlusion appears to be completed event; DES to mid circumflex October 2019   Essential hypertension    Hyperlipidemia    Ischemic cardiomyopathy    NSTEMI (non-ST elevated myocardial infarction) (HCC)    11/26/14, 01/31/18    Surgical History: Past Surgical History:  Procedure Laterality Date   BRONCHIAL BIOPSY  12/01/2022   Procedure: BRONCHIAL BIOPSIES;  Surgeon: Martina Sinner, MD;  Location: [facility] ENDOSCOPY;  Service: Pulmonary;;   BRONCHIAL BRUSHINGS  12/01/2022   Procedure: BRONCHIAL BRUSHINGS;  Surgeon: Martina Sinner, MD;  Location: [facility] ENDOSCOPY;  Service: Pulmonary;;   BRONCHIAL WASHINGS  12/01/2022   Procedure: BRONCHIAL WASHINGS;  Surgeon: Martina Sinner, MD;  Location: [facility] ENDOSCOPY;  Service: Pulmonary;;   CARDIAC CATHETERIZATION N/A 11/26/2014   Procedure: Left Heart Cath and Coronary Angiography;  Surgeon: Peter M Swaziland, MD;  Location: [facility] INVASIVE CV LAB;  Service: Cardiovascular;  Laterality: N/A;   COLONOSCOPY     per patient: done in [place], age 68, couple of polyps.  COLONOSCOPY WITH PROPOFOL N/A 10/08/2022   Procedure: COLONOSCOPY WITH PROPOFOL;  Surgeon: Lanelle Bal, DO;  Location: [facility] ENDO SUITE;  Service: Endoscopy;  Laterality: N/A;  11:00 am   CORONARY STENT INTERVENTION N/A 01/31/2018   Procedure: CORONARY STENT INTERVENTION;  Surgeon: Runell Gess, MD;  Location: [facility] INVASIVE CV LAB;  Service: Cardiovascular;  Laterality: N/A;   HEMOSTASIS CONTROL  12/01/2022   Procedure: HEMOSTASIS CONTROL;  Surgeon: Martina Sinner, MD;  Location: [facility] ENDOSCOPY;  Service: Pulmonary;;  cold saline   IR IMAGING GUIDED PORT INSERTION  12/23/2022   LEFT HEART CATH AND CORONARY ANGIOGRAPHY N/A 01/31/2018   Procedure: LEFT HEART CATH AND CORONARY ANGIOGRAPHY;  Surgeon: Runell Gess, MD;  Location: [facility] INVASIVE CV LAB;  Service:  Cardiovascular;  Laterality: N/A;   NECK SURGERY  2005   VIDEO BRONCHOSCOPY N/A 12/01/2022   Procedure: VIDEO BRONCHOSCOPY WITH FLUORO;  Surgeon: Martina Sinner, MD;  Location: [facility] ENDOSCOPY;  Service: Pulmonary;  Laterality: N/A;    Social History: Social History   Socioeconomic History   Marital status: Married    Spouse name: Not on file   Number of children: Not on file   Years of education: Not on file   Highest education level: Not on file  Occupational History   Not on file  Tobacco Use   Smoking status: Every Day    Current packs/day: 1.00    Average packs/day: 1 pack/day for 42.0 years (42.0 ttl pk-yrs)    Types: Cigarettes   Smokeless tobacco: Never   Tobacco comments:    He used to quit and start back. He has not quit for the past 5 years.   Vaping Use   Vaping status: Never Used  Substance and Sexual Activity   Alcohol use: Yes    Comment: 10 cans of beer   Drug use: Yes    Types: Marijuana    Comment: smokes week here and there   Sexual activity: Yes    Birth control/protection: None  Other Topics Concern   Not on file  Social History Narrative   Works at [employer] and live with wife.    Social Determinants of Health   Financial Resource Strain: High Risk (12/31/2022)   Overall Financial Resource Strain (CARDIA)    Difficulty of Paying Living Expenses: Very hard  Food Insecurity: No Food Insecurity (12/14/2022)   Hunger Vital Sign    Worried About Running Out of Food in the Last Year: Never true    Ran Out of Food in the Last Year: Never true  Transportation Needs: No Transportation Needs (12/14/2022)   PRAPARE - Administrator, Civil Service (Medical): No    Lack of Transportation (Non-Medical): No  Physical Activity: Not on file  Stress: Not on file  Social Connections: Not on file  Intimate Partner Violence: Not At Risk (12/14/2022)   Humiliation, Afraid, Rape, and Kick questionnaire    Fear of Current or Ex-Partner: No     Emotionally Abused: No    Physically Abused: No    Sexually Abused: No    Family History: Family History  Problem Relation Age of Onset   Diabetes Mother    Heart attack Father    Cancer Sister    Hypertension Brother    Colon cancer Neg Hx    Prostate cancer Neg Hx    Lung cancer Neg Hx     Current Medications:  Current Outpatient Medications:    acetaminophen (TYLENOL) 500  MG tablet, Take 500 mg by mouth every 6 (six) hours as needed for mild pain., Disp: , Rfl:    albuterol (VENTOLIN HFA) 108 (90 Base) MCG/ACT inhaler, Inhale 2 puffs into the lungs every 6 (six) hours as needed for wheezing or shortness of breath., Disp: 8 g, Rfl: 2   aluminum-magnesium hydroxide 200-200 MG/5ML suspension, Take 10 mLs by mouth every 6 (six) hours as needed for indigestion., Disp: 480 mL, Rfl: 2   aspirin EC 81 MG tablet, Take 1 tablet (81 mg total) by mouth daily. Swallow whole., Disp: 90 tablet, Rfl: 3   atorvastatin (LIPITOR) 80 MG tablet, Take 1 tablet (80 mg total) by mouth daily. TAKE 1 TABLET BY MOUTH EVERY DAY AT 6 PM (Patient taking differently: Take 80 mg by mouth at bedtime.), Disp: 90 tablet, Rfl: 1   CARBOPLATIN IV, Inject into the vein once a week., Disp: , Rfl:    dapagliflozin propanediol (FARXIGA) 10 MG TABS tablet, Take 1 tablet (10 mg total) by mouth daily before breakfast., Disp: 90 tablet, Rfl: 3   empagliflozin (JARDIANCE) 10 MG TABS tablet, Take 1 tablet (10 mg total) by mouth daily before breakfast., Disp: 30 tablet, Rfl: 3   ezetimibe (ZETIA) 10 MG tablet, Take 1 tablet (10 mg total) by mouth daily., Disp: 90 tablet, Rfl: 3   HYDROcodone-acetaminophen (NORCO) 7.5-325 MG tablet, Take 1 tablet by mouth every 6 (six) hours as needed for moderate pain., Disp: 120 tablet, Rfl: 0   lidocaine-prilocaine (EMLA) cream, Apply to affected area once, Disp: 30 g, Rfl: 3   Menthol, Topical Analgesic, (BIOFREEZE EX), Apply 1 Application topically daily as needed (pain)., Disp: , Rfl:     nitroGLYCERIN (NITROSTAT) 0.4 MG SL tablet, Place 1 tablet (0.4 mg total) under the tongue every 5 (five) minutes as needed., Disp: 25 tablet, Rfl: 0   PACLITAXEL IV, Inject into the vein once a week., Disp: , Rfl:    prochlorperazine (COMPAZINE) 10 MG tablet, Take 1 tablet (10 mg total) by mouth every 6 (six) hours as needed for nausea or vomiting., Disp: 60 tablet, Rfl: 1   sildenafil (VIAGRA) 50 MG tablet, Take 50 mg by mouth daily as needed., Disp: , Rfl:    lisinopril (ZESTRIL) 10 MG tablet, Take 1 tablet by mouth once daily (Patient not taking: Reported on 12/25/2022), Disp: 30 tablet, Rfl: 0   metoprolol succinate (TOPROL-XL) 25 MG 24 hr tablet, Take 1 tablet by mouth once daily (Patient not taking: Reported on 12/25/2022), Disp: 90 tablet, Rfl: 0   Allergies: No Known Allergies  REVIEW OF SYSTEMS:   Review of Systems  Constitutional:  Positive for fatigue. Negative for chills and fever.  HENT:   Positive for sore throat and trouble swallowing. Negative for lump/mass, mouth sores and nosebleeds.   Eyes:  Negative for eye problems.  Respiratory:  Positive for cough. Negative for shortness of breath.   Cardiovascular:  Negative for chest pain, leg swelling and palpitations.  Gastrointestinal:  Positive for constipation. Negative for abdominal pain, diarrhea, nausea and vomiting.  Genitourinary:  Negative for bladder incontinence, difficulty urinating, dysuria, frequency, hematuria and nocturia.   Musculoskeletal:  Negative for arthralgias, back pain, flank pain, myalgias and neck pain.  Skin:  Negative for itching and rash.  Neurological:  Positive for numbness. Negative for dizziness and headaches.  Hematological:  Does not bruise/bleed easily.  Psychiatric/Behavioral:  Negative for depression, sleep disturbance and suicidal ideas. The patient is not nervous/anxious.   All other systems reviewed and  are negative.    VITALS:   Blood pressure 123/83, pulse (!) 108.  Wt Readings from  Last 3 Encounters:  12/31/22 175 lb 12.8 oz (79.7 kg)  12/23/22 181 lb (82.1 kg)  12/22/22 181 lb (82.1 kg)    There is no height or weight on file to calculate BMI.  Performance status (ECOG): 1 - Symptomatic but completely ambulatory  PHYSICAL EXAM:   Physical Exam Vitals and nursing note reviewed. Exam conducted with a chaperone present.  Constitutional:      Appearance: Normal appearance.  Cardiovascular:     Rate and Rhythm: Normal rate and regular rhythm.     Pulses: Normal pulses.     Heart sounds: Normal heart sounds.  Pulmonary:     Effort: Pulmonary effort is normal.     Breath sounds: Normal breath sounds.  Abdominal:     Palpations: Abdomen is soft. There is no hepatomegaly, splenomegaly or mass.     Tenderness: There is no abdominal tenderness.  Musculoskeletal:     Right lower leg: No edema.     Left lower leg: No edema.  Lymphadenopathy:     Cervical: No cervical adenopathy.     Right cervical: No superficial, deep or posterior cervical adenopathy.    Left cervical: No superficial, deep or posterior cervical adenopathy.     Upper Body:     Right upper body: No supraclavicular or axillary adenopathy.     Left upper body: No supraclavicular or axillary adenopathy.  Neurological:     General: No focal deficit present.     Mental Status: He is alert and oriented to person, place, and time.  Psychiatric:        Mood and Affect: Mood normal.        Behavior: Behavior normal.     LABS:      Latest Ref Rng & Units 12/31/2022    8:10 AM 12/25/2022    8:09 AM 12/03/2022   11:00 PM  CBC  WBC 4.0 - 10.5 K/uL 6.9  9.6  14.7   Hemoglobin 13.0 - 17.0 g/dL 06.3  9.9  01.6   Hematocrit 39.0 - 52.0 % 33.6  30.4  36.6   Platelets 150 - 400 K/uL 408  393  381       Latest Ref Rng & Units 12/31/2022    8:10 AM 12/25/2022    8:09 AM 12/03/2022   11:00 PM  CMP  Glucose 70 - 99 mg/dL 010  932  355   BUN 8 - 23 mg/dL 13  16  14    Creatinine 0.61 - 1.24 mg/dL 7.32   2.02  5.42   Sodium 135 - 145 mmol/L 132  133  130   Potassium 3.5 - 5.1 mmol/L 3.8  3.8  3.9   Chloride 98 - 111 mmol/L 101  100  94   CO2 22 - 32 mmol/L 23  23  27    Calcium 8.9 - 10.3 mg/dL 8.7  8.6  9.9   Total Protein 6.5 - 8.1 g/dL 7.9  6.9    Total Bilirubin 0.3 - 1.2 mg/dL 0.5  0.4    Alkaline Phos 38 - 126 U/L 74  66    AST 15 - 41 U/L 19  15    ALT 0 - 44 U/L 32  21       No results found for: "CEA1", "CEA" / No results found for: "CEA1", "CEA" Lab Results  Component Value Date   PSA1 0.6  07/31/2021   No results found for: "CAN199" No results found for: "CAN125"  No results found for: "TOTALPROTELP", "ALBUMINELP", "A1GS", "A2GS", "BETS", "BETA2SER", "GAMS", "MSPIKE", "SPEI" No results found for: "TIBC", "FERRITIN", "IRONPCTSAT" No results found for: "LDH"   STUDIES:   MR BRAIN W WO CONTRAST  Result Date: 12/29/2022 CLINICAL DATA:  Provided history: Non-small cell lung cancer, staging. Malignant neoplasm of unspecified part of unspecified bronchus or lung. EXAM: MRI HEAD WITHOUT AND WITH CONTRAST TECHNIQUE: Multiplanar, multiecho pulse sequences of the brain and surrounding structures were obtained without and with intravenous contrast. CONTRAST:  9 mL Vueway intravenous contrast. COMPARISON:  None. FINDINGS: Brain: No age advanced or lobar predominant parenchymal atrophy. Multifocal T2 FLAIR hyperintense signal abnormality within the cerebral white matter, mild but greater than expected for age. These signal changes are nonspecific, but compatible with chronic small vessel ischemic disease. Small chronic infarct within the right cerebellar hemisphere. There is no acute infarct. No evidence of an intracranial mass. No chronic intracranial blood products. No extra-axial fluid collection. No midline shift. No pathologic intracranial enhancement identified. Vascular: Maintained flow voids within the proximal large arterial vessels. Skull and upper cervical spine: No focal  suspicious marrow lesion. Sinuses/Orbits: No mass or acute finding within the imaged orbits. Tiny mucous retention cyst within right sphenoid sinus. Minimal mucosal thickening within the bilateral maxillary sinuses. IMPRESSION: 1. No evidence of intracranial metastatic disease. 2. Chronic small vessel ischemic changes within the cerebral white matter, mild but greater than expected for age. 3. Small chronic infarct within the right cerebellar hemisphere. 4. Minor paranasal sinus disease. Electronically Signed   By: Jackey Loge D.O.   On: 12/29/2022 18:10   IR IMAGING GUIDED PORT INSERTION  Result Date: 12/23/2022 CLINICAL DATA:  Non-small cell left lung carcinoma, needs durable venous access for planned treatment regimen EXAM: TUNNELED PORT CATHETER PLACEMENT WITH ULTRASOUND AND FLUOROSCOPIC GUIDANCE FLUOROSCOPY: Radiation Exposure Index (as provided by the fluoroscopic device): 1 mGy air Kerma ANESTHESIA/SEDATION: Intravenous Fentanyl and Versed 1mg  were administered as conscious sedation during continuous monitoring of the patient's level of consciousness and physiological / cardiorespiratory status by the radiology RN, with a total moderate sedation time of 15 minutes. TECHNIQUE: The procedure, risks, benefits, and alternatives were explained to the patient. Questions regarding the procedure were encouraged and answered. The patient understands and consents to the procedure. Patency of the right IJ vein was confirmed with ultrasound with image documentation. An appropriate skin site was determined. Skin site was marked. Region was prepped using maximum barrier technique including cap and mask, sterile gown, sterile gloves, large sterile sheet, and Chlorhexidine as cutaneous antisepsis. The region was infiltrated locally with 1% lidocaine. Under real-time ultrasound guidance, the right IJ vein was accessed with a 21 gauge micropuncture needle; the needle tip within the vein was confirmed with ultrasound  image documentation. Needle was exchanged over a 018 guidewire for transitional dilator, and vascular measurement was performed. A small incision was made on the right anterior chest wall and a subcutaneous pocket fashioned. The power-injectable port was positioned and its catheter tunneled to the right IJ dermatotomy site. The transitional dilator was exchanged over an Amplatz wire for a peel-away sheath, through which the port catheter, which had been trimmed to the appropriate length, was advanced and positioned under fluoroscopy with its tip at the cavoatrial junction. Spot chest radiograph confirms good catheter position and no pneumothorax. The port was flushed per protocol. The pocket was closed with deep interrupted and subcuticular continuous 3-0 Monocryl  sutures. The incisions were covered with Dermabond then covered with a sterile dressing. The patient tolerated the procedure well. COMPLICATIONS: COMPLICATIONS None immediate IMPRESSION: Technically successful right IJ power-injectable port catheter placement. Ready for routine use. Electronically Signed   By: Corlis Leak M.D.   On: 12/23/2022 15:30   NM PET Image Initial (PI) Skull Base To Thigh  Result Date: 12/21/2022 CLINICAL DATA:  Initial treatment strategy for non-small cell lung cancer. Chest wall invasion. EXAM: NUCLEAR MEDICINE PET SKULL BASE TO THIGH TECHNIQUE: 10.3 mCi F-18 FDG was injected intravenously. Full-ring PET imaging was performed from the skull base to thigh after the radiotracer. CT data was obtained and used for attenuation correction and anatomic localization. Fasting blood glucose: 79 mg/dl COMPARISON:  Chest CT 95/28/4132 FINDINGS: Mediastinal blood pool activity: SUV max Liver activity: SUV max NA NECK: Hypermetabolic LEFT supraclavicular mass measures 3.2 cm with SUV max equal 23.5. LEFT super clavicular mass is a direct extension from the mediastinal adenopathy. Smaller hypermetabolic RIGHT supraclavicular node on image 35  with SUV max equal 19. Incidental CT findings: None. CHEST: Large LEFT upper lobe mass measures 7.4 x 6.1 cm i with) ntense metabolic activity (SUV max equal 23. Mass extends to the LEFT hilum as well as to the AP window. Hypermetabolic prevascular and paratracheal nodes are present. Contralateral RIGHT paratracheal node measures 20 mm on image 43 with intense metabolic activity. There is extensive involvement of the sternum manubrium and first RIGHT rib costo manubrial. There is soft tissue extension into the medial border of the overlying pectoralis muscle (image 45 with SUV max equal 20) There is discrete hypermetabolic RIGHT upper lobe pulmonary nodule measuring 10 mm with SUV max equal 11 on image 52. There is an additional hypermetabolic lymph node along the descending thoracic aorta/lateral border image 56. Incidental CT findings: None. ABDOMEN/PELVIS: No abnormal hypermetabolic activity within the liver, pancreas, adrenal glands, or spleen. No hypermetabolic lymph nodes in the abdomen or pelvis. Incidental CT findings: None. SKELETON: Direct invasion of the manubrium. No additional evidence skeletal metastasis Incidental CT findings: None. IMPRESSION: 1. 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 Direct invasion of the AP window and LEFT superior hilum. 2. Extensive metastatic adenopathy to the AP window, contralateral paratracheal nodes as well as bilateral supraclavicular nodes. Bulky LEFT supraclavicular disease. 3. Direct skeletal invasion of the manubrium and first RIGHT rib costamanubrial junction 4. Soft tissue extension into the medial border of the overlying RIGHT pectoralis muscle. 5. Discrete hypermetabolic RIGHT upper lobe pulmonary nodule consistent with metastatic disease. 6. No evidence metastatic disease in the abdomen pelvis. Electronically Signed   By: Genevive Bi M.D.   On: 12/21/2022 15:16   CT Angio Chest PE W/Cm &/Or Wo Cm  Result Date:  12/04/2022 CLINICAL DATA:  Chest pain EXAM: CT ANGIOGRAPHY CHEST WITH CONTRAST TECHNIQUE: Multidetector CT imaging of the chest was performed using the standard protocol during bolus administration of intravenous contrast. Multiplanar CT image reconstructions and MIPs were obtained to evaluate the vascular anatomy. RADIATION DOSE REDUCTION: This exam was performed according to the departmental dose-optimization program which includes automated exposure control, adjustment of the mA and/or kV according to patient size and/or use of iterative reconstruction technique. CONTRAST:  75mL OMNIPAQUE IOHEXOL 350 MG/ML SOLN COMPARISON:  11/30/2022 FINDINGS: Cardiovascular: Contrast injection is sufficient to demonstrate satisfactory opacification of the pulmonary arteries to the segmental level. There is no pulmonary embolus or evidence of right heart strain. The size of the main pulmonary artery is  normal. Heart size is normal, with no pericardial effusion. The course and caliber of the aorta are normal. There is atherosclerotic calcification. Opacification decreased due to pulmonary arterial phase contrast bolus timing. Mediastinum/Nodes: There is mediastinal extension of the left upper lobe, left hilar mass, unchanged. Encasement of the left upper lobe arterial branches is unchanged. Bulky mediastinal adenopathy is unchanged. Lungs/Pleura: Unchanged appearance of left upper lobe mass, measuring 7.1 x 6.0 cm. Small left pleural effusion. Upper Abdomen: Contrast bolus timing is not optimized for evaluation of the abdominal organs. The visualized portions of the organs of the upper abdomen are normal. Musculoskeletal: Unchanged appearance of superior right sternal lytic lesion. Review of the MIP images confirms the above findings. IMPRESSION: 1. No pulmonary embolus or acute aortic syndrome. 2. Unchanged appearance of left upper lobe mass with mediastinal extension and bulky mediastinal adenopathy. 3. Small left pleural  effusion. Aortic Atherosclerosis (ICD10-I70.0). Electronically Signed   By: Deatra Robinson M.D.   On: 12/04/2022 01:54   DG Chest Port 1 View  Result Date: 12/03/2022 CLINICAL DATA:  Chest pain. EXAM: PORTABLE CHEST 1 VIEW COMPARISON:  12/02/2022. FINDINGS: Heart is enlarged and the mediastinal contour is within normal limits. Lung volumes are low and there is chronic elevation of the left diaphragm. A stable focal opacities noted in the left upper lobe, compatible with known mass. No effusion or pneumothorax. Cervical spinal fusion hardware is noted. No acute osseous abnormality. IMPRESSION: Stable opacity in the left upper lobe, compatible with known mass. No acute abnormality is seen. Electronically Signed   By: Thornell Sartorius M.D.   On: 12/03/2022 23:11

## 2022-12-30 NOTE — Telephone Encounter (Signed)
RN spoke with patient about any concerns related to his diagnosis and treatment.  Patient stated he is aware and knows he will start chemotherapy as part of his treatment.  No other concerns and call ended.

## 2022-12-31 ENCOUNTER — Ambulatory Visit: Payer: 59

## 2022-12-31 ENCOUNTER — Inpatient Hospital Stay: Payer: 59

## 2022-12-31 ENCOUNTER — Inpatient Hospital Stay: Payer: 59 | Admitting: Dietician

## 2022-12-31 ENCOUNTER — Inpatient Hospital Stay: Payer: 59 | Admitting: Licensed Clinical Social Worker

## 2022-12-31 ENCOUNTER — Encounter: Payer: Self-pay | Admitting: Hematology

## 2022-12-31 ENCOUNTER — Inpatient Hospital Stay (HOSPITAL_BASED_OUTPATIENT_CLINIC_OR_DEPARTMENT_OTHER): Payer: 59 | Admitting: Hematology

## 2022-12-31 VITALS — BP 160/94 | HR 116 | Temp 97.2°F | Resp 18 | Ht 66.0 in | Wt 175.8 lb

## 2022-12-31 DIAGNOSIS — I871 Compression of vein: Secondary | ICD-10-CM | POA: Diagnosis not present

## 2022-12-31 DIAGNOSIS — Z510 Encounter for antineoplastic radiation therapy: Secondary | ICD-10-CM | POA: Diagnosis not present

## 2022-12-31 DIAGNOSIS — C3492 Malignant neoplasm of unspecified part of left bronchus or lung: Secondary | ICD-10-CM

## 2022-12-31 DIAGNOSIS — C3412 Malignant neoplasm of upper lobe, left bronchus or lung: Secondary | ICD-10-CM | POA: Diagnosis not present

## 2022-12-31 DIAGNOSIS — Z5112 Encounter for antineoplastic immunotherapy: Secondary | ICD-10-CM | POA: Diagnosis not present

## 2022-12-31 DIAGNOSIS — Z5111 Encounter for antineoplastic chemotherapy: Secondary | ICD-10-CM | POA: Diagnosis not present

## 2022-12-31 DIAGNOSIS — Z95828 Presence of other vascular implants and grafts: Secondary | ICD-10-CM

## 2022-12-31 DIAGNOSIS — Z923 Personal history of irradiation: Secondary | ICD-10-CM | POA: Diagnosis not present

## 2022-12-31 DIAGNOSIS — Z79630 Long term (current) use of alkylating agent: Secondary | ICD-10-CM | POA: Diagnosis not present

## 2022-12-31 DIAGNOSIS — Z79620 Long term (current) use of immunosuppressive biologic: Secondary | ICD-10-CM | POA: Diagnosis not present

## 2022-12-31 DIAGNOSIS — Z79633 Long term (current) use of mitotic inhibitor: Secondary | ICD-10-CM | POA: Diagnosis not present

## 2022-12-31 DIAGNOSIS — K209 Esophagitis, unspecified without bleeding: Secondary | ICD-10-CM | POA: Diagnosis not present

## 2022-12-31 DIAGNOSIS — Z809 Family history of malignant neoplasm, unspecified: Secondary | ICD-10-CM | POA: Diagnosis not present

## 2022-12-31 DIAGNOSIS — R079 Chest pain, unspecified: Secondary | ICD-10-CM | POA: Diagnosis not present

## 2022-12-31 DIAGNOSIS — I10 Essential (primary) hypertension: Secondary | ICD-10-CM | POA: Diagnosis not present

## 2022-12-31 DIAGNOSIS — F17210 Nicotine dependence, cigarettes, uncomplicated: Secondary | ICD-10-CM | POA: Diagnosis not present

## 2022-12-31 LAB — CBC WITH DIFFERENTIAL/PLATELET
Abs Immature Granulocytes: 0.05 10*3/uL (ref 0.00–0.07)
Basophils Absolute: 0 10*3/uL (ref 0.0–0.1)
Basophils Relative: 0 %
Eosinophils Absolute: 0.1 10*3/uL (ref 0.0–0.5)
Eosinophils Relative: 2 %
HCT: 33.6 % — ABNORMAL LOW (ref 39.0–52.0)
Hemoglobin: 11.2 g/dL — ABNORMAL LOW (ref 13.0–17.0)
Immature Granulocytes: 1 %
Lymphocytes Relative: 12 %
Lymphs Abs: 0.8 10*3/uL (ref 0.7–4.0)
MCH: 28.4 pg (ref 26.0–34.0)
MCHC: 33.3 g/dL (ref 30.0–36.0)
MCV: 85.1 fL (ref 80.0–100.0)
Monocytes Absolute: 0.8 10*3/uL (ref 0.1–1.0)
Monocytes Relative: 11 %
Neutro Abs: 5.1 10*3/uL (ref 1.7–7.7)
Neutrophils Relative %: 74 %
Platelets: 408 10*3/uL — ABNORMAL HIGH (ref 150–400)
RBC: 3.95 MIL/uL — ABNORMAL LOW (ref 4.22–5.81)
RDW: 14.4 % (ref 11.5–15.5)
WBC: 6.9 10*3/uL (ref 4.0–10.5)
nRBC: 0 % (ref 0.0–0.2)

## 2022-12-31 LAB — COMPREHENSIVE METABOLIC PANEL
ALT: 32 U/L (ref 0–44)
AST: 19 U/L (ref 15–41)
Albumin: 3.2 g/dL — ABNORMAL LOW (ref 3.5–5.0)
Alkaline Phosphatase: 74 U/L (ref 38–126)
Anion gap: 8 (ref 5–15)
BUN: 13 mg/dL (ref 8–23)
CO2: 23 mmol/L (ref 22–32)
Calcium: 8.7 mg/dL — ABNORMAL LOW (ref 8.9–10.3)
Chloride: 101 mmol/L (ref 98–111)
Creatinine, Ser: 0.62 mg/dL (ref 0.61–1.24)
GFR, Estimated: >60 mL/min (ref >60)
Glucose, Bld: 110 mg/dL — ABNORMAL HIGH (ref 70–99)
Potassium: 3.8 mmol/L (ref 3.5–5.1)
Sodium: 132 mmol/L — ABNORMAL LOW (ref 135–145)
Total Bilirubin: 0.5 mg/dL (ref 0.3–1.2)
Total Protein: 7.9 g/dL (ref 6.5–8.1)

## 2022-12-31 LAB — MAGNESIUM: Magnesium: 2.4 mg/dL (ref 1.7–2.4)

## 2022-12-31 MED ORDER — ALUMINUM & MAGNESIUM HYDROXIDE 200-200 MG/5ML PO SUSP: 10.0000 mL | Freq: Four times a day (QID) | ORAL | 2 refills | Status: DC | PRN

## 2022-12-31 MED ORDER — SODIUM CHLORIDE 0.9% FLUSH
10.0000 mL | INTRAVENOUS | Status: DC | PRN
  Administered 2022-12-31: 10 mL via INTRAVENOUS

## 2022-12-31 MED ORDER — HEPARIN SOD (PORK) LOCK FLUSH 100 UNIT/ML IV SOLN: 500.0000 [IU] | Freq: Once | INTRAVENOUS | Status: DC

## 2022-12-31 MED ORDER — HEPARIN SOD (PORK) LOCK FLUSH 100 UNIT/ML IV SOLN
500.0000 [IU] | Freq: Once | INTRAVENOUS | Status: AC
  Administered 2022-12-31: 500 [IU] via INTRAVENOUS

## 2022-12-31 NOTE — Progress Notes (Signed)
We will hold his treatment today. Patient will need to be on a different treatment regimen per Dr.K.  Discharged from clinic ambulatory in stable condition. Alert and oriented x 3. F/U with [facility] as scheduled.

## 2022-12-31 NOTE — Patient Instructions (Addendum)
[facility] at [facility] Discharge Instructions   You were seen and examined today by Dr. Ellin Saba.  He reviewed the results of your lab work which are normal/stable.   Now that you have completed radiation therapy, we need to change your treatment plan. Dr. Kirtland Bouchard plans to give you high dose chemotherapy along with immunotherapy drugs. The chemo will be given for just a few times and then fall off and you will receive treatment with the immunotherapy only.   We will hold your treatment today.  Return as scheduled.    Thank you for choosing  Cancer Center at [facility] to provide your oncology and hematology care.  To afford each patient quality time with our provider, please arrive at least 15 minutes before your scheduled appointment time.   If you have a lab appointment with the Cancer Center please come in thru the Main Entrance and check in at the main information desk.  You need to re-schedule your appointment should you arrive 10 or more minutes late.  We strive to give you quality time with our providers, and arriving late affects you and other patients whose appointments are after yours.  Also, if you no show three or more times for appointments you may be dismissed from the clinic at the providers discretion.     Again, thank you for choosing [facility].  Our hope is that these requests will decrease the amount of time that you wait before being seen by our physicians.       _____________________________________________________________  Should you have questions after your visit to [facility], please contact our office at [phone] and follow the prompts.  Our office hours are 8:00 a.m. and 4:30 p.m. Monday - Friday.  Please note that voicemails left after 4:00 p.m. may not be returned until the following business day.  We are closed weekends and major holidays.  You do have access to a nurse 24-7, just call  the main number to the clinic [phone] and do not press any options, hold on the line and a nurse will answer the phone.    For prescription refill requests, have your pharmacy contact our office and allow 72 hours.    Due to Covid, you will need to wear a mask upon entering the hospital. If you do not have a mask, a mask will be given to you at the Main Entrance upon arrival. For doctor visits, patients may have 1 support person age 79 or older with them. For treatment visits, patients can not have anyone with them due to social distancing guidelines and our immunocompromised population.

## 2022-12-31 NOTE — Progress Notes (Signed)
[facility] Clinical Social Work  Initial Assessment   Rodney Mahoney is a 62 y.o. year old male presenting alone. Clinical Social Work was referred by medical provider for assessment of psychosocial needs.   SDOH (Social Determinants of Health) assessments performed: Yes SDOH Interventions    Flowsheet Row Office Visit from 07/28/2022 in [facility]  SDOH Interventions   Depression Interventions/Treatment  Patient refuses Treatment       SDOH Screenings   Food Insecurity: No Food Insecurity (12/14/2022)  Housing: Low Risk  (12/14/2022)  Transportation Needs: No Transportation Needs (12/14/2022)  Utilities: Not At Risk (12/14/2022)  Alcohol Screen: Low Risk  (12/14/2022)  Depression (PHQ2-9): Medium Risk (11/25/2022)  Tobacco Use: High Risk (12/31/2022)     Distress Screen completed: No     No data to display            Family/Social Information:  Housing Arrangement: patient lives alone Family members/support persons in your life? Pt is legally separated.  His wife lives in an apartment nearby, but the two are not together.  Per pt his wife continues to assist w/ decision making and navigating through the health system.  Pt has other family locally that can provide limited support, but most support will come from his wife. Transportation concerns: no, as long as pt is able to drive he will transport himself to treatment.  Per pt his wife has been speaking with the DSS to secure transportation if it is needed.  Pt has Medicaid and should be eligible for transportation through them.    Employment: Retired from a Education officer, environmental as a International aid/development worker.  Income source: Actor concerns: Yes, current concerns Type of concern: Utilities and Rent/ mortgage Food access concerns: no Religious or spiritual practice: Data processing manager Currently in place:  none  Coping/ Adjustment to diagnosis: Patient understands treatment plan and what  happens next? Pt's treatment plan is in the process of being changed.  Pt is not yet sure what his treatment plan will be. Concerns about diagnosis and/or treatment: Overwhelmed by information, Afraid of cancer, How will I care for myself, and Quality of life Patient reported stressors: Finances, Anxiety/ nervousness, and Adjusting to my illness Hopes and/or priorities: Pt's priority is to start treatment w/ the hope of positive results. Patient enjoys  not addressed Current coping skills/ strengths: Capable of independent living , Motivation for treatment/growth , Physical Health , and Supportive family/friends     SUMMARY: Current SDOH Barriers:  Financial constraints related to fixed income  Clinical Social Work Clinical Goal(s):  Explore community resource options for unmet needs related to:  Financial Strain   Interventions: Discussed common feeling and emotions when being diagnosed with cancer, and the importance of support during treatment Informed patient of the support team roles and support services at [facility] Provided CSW contact information and encouraged patient to call with any questions or concerns Referred patient to financial resource specialist to apply for the [employer].  Suggested pt check w/ DSS to see if he may be eligible for any SSI benefits as well.  Offered to refer to Marijean Niemann, pt would like to discuss with his wife before agreeing to referral.     Follow Up Plan: Patient will contact CSW with any support or resource needs Patient verbalizes understanding of plan: Yes    Rachel Moulds, LCSW Clinical Social Worker [facility]  Patient is participating in a Managed Medicaid Plan:  Yes

## 2022-12-31 NOTE — Progress Notes (Signed)
Patients port flushed without difficulty.  Good blood return noted with no bruising or swelling noted at site.  Patient remains accessed for treatment.  

## 2023-01-01 ENCOUNTER — Ambulatory Visit: Payer: 59

## 2023-01-01 ENCOUNTER — Telehealth: Payer: Self-pay | Admitting: Pharmacy Technician

## 2023-01-01 ENCOUNTER — Other Ambulatory Visit (HOSPITAL_COMMUNITY): Payer: Self-pay

## 2023-01-01 ENCOUNTER — Encounter: Payer: Self-pay | Admitting: Hematology

## 2023-01-01 NOTE — Telephone Encounter (Signed)
Pharmacy Patient Advocate Encounter   Received notification from CoverMyMeds that prior authorization for Dapagliflozin Propanediol 10MG  tablets is required/requested.   Insurance verification completed.   The patient is insured through [facility] .   Per test claim:  Jardiance 10 mg is preferred by the ins.  If suggested medication is appropriate, Please send in a new RX and discontinue this one. If not, please advise as to why it's not appropriate so that we may request a Prior Authorization.

## 2023-01-03 DIAGNOSIS — C3412 Malignant neoplasm of upper lobe, left bronchus or lung: Secondary | ICD-10-CM | POA: Diagnosis not present

## 2023-01-04 ENCOUNTER — Ambulatory Visit: Payer: 59

## 2023-01-05 ENCOUNTER — Encounter (HOSPITAL_COMMUNITY): Payer: Self-pay

## 2023-01-05 ENCOUNTER — Encounter (HOSPITAL_COMMUNITY): Payer: Self-pay | Admitting: Hematology

## 2023-01-05 ENCOUNTER — Other Ambulatory Visit (HOSPITAL_COMMUNITY): Payer: Self-pay | Admitting: Hematology

## 2023-01-05 ENCOUNTER — Ambulatory Visit: Payer: 59

## 2023-01-05 NOTE — Progress Notes (Signed)
START OFF PATHWAY REGIMEN - Non-Small Cell Lung   [id number]:Durvalumab 1,500 mg IV D1 + Tremelimumab 75 mg IV D1 + Carboplatin AUC=6 IV D1 + Nab-Paclitaxel 100 mg/m2 IV D1,8,15 q21 Days x 4 Cycles:   Cycles 1 through 4: A cycle is every 21 days:     Tremelimumab-actl      Durvalumab      Nab-paclitaxel (protein bound)      Carboplatin   **Always confirm dose/schedule in your pharmacy ordering system**  Patient Characteristics: Stage IV Metastatic, Squamous, Molecular Analysis Completed, Alteration Present and Targeted Therapy Exhausted or EGFR Exon 20 Insertion or KRAS G12C or HER2 Present, and No Prior Chemo/Immunotherapy or No Alteration Present, PS = 0, 1, Initial  Chemotherapy/Immunotherapy, No Alteration Present, Immunotherapy Candidate, PD-L1 Expression Positive 1-49% (TPS) / Negative / Not Tested / Awaiting Test Results Therapeutic Status: Stage IV Metastatic Histology: Squamous Cell Molecular Analysis Results: No Alteration Present ECOG Performance Status: 0 Chemotherapy/Immunotherapy Line of Therapy: Initial Chemotherapy/Immunotherapy Immunotherapy Candidate Status: Candidate for Immunotherapy PD-L1 Expression Status: PD-L1 Negative Intent of Therapy: Non-Curative / Palliative Intent, Discussed with Patient

## 2023-01-05 NOTE — Progress Notes (Signed)
DISCONTINUE OFF PATHWAY REGIMEN - Non-Small Cell Lung   [id number]:Carboplatin + Paclitaxel (2/50) + RT weekly x 6 weeks:   Administer weekly during RT:     Paclitaxel      Carboplatin   **Always confirm dose/schedule in your pharmacy ordering system**  REASON: Other Reason PRIOR TREATMENT: Off Pathway: Carboplatin + Paclitaxel (2/50) + RT weekly x 6 weeks TREATMENT RESPONSE: Unable to Evaluate    Patient Characteristics: Stage IV Metastatic, Squamous, Molecular Analysis Completed, Alteration Present and Targeted Therapy Exhausted or EGFR Exon 20 Insertion or KRAS G12C or HER2 Present, and No Prior Chemo/Immunotherapy or No Alteration Present, PS = 0, 1, Initial  Chemotherapy/Immunotherapy, No Alteration Present, Immunotherapy Candidate, PD-L1 Expression Positive 1-49% (TPS) / Negative / Not Tested / Awaiting Test Results Therapeutic Status: Stage IV Metastatic Histology: Squamous Cell Molecular Analysis Results: No Alteration Present ECOG Performance Status: 0 Chemotherapy/Immunotherapy Line of Therapy: Initial Chemotherapy/Immunotherapy Immunotherapy Candidate Status: Candidate for Immunotherapy PD-L1 Expression Status: PD-L1 Negative

## 2023-01-06 ENCOUNTER — Ambulatory Visit: Payer: 59

## 2023-01-07 ENCOUNTER — Inpatient Hospital Stay: Payer: 59

## 2023-01-07 ENCOUNTER — Inpatient Hospital Stay: Payer: 59 | Admitting: Hematology

## 2023-01-07 ENCOUNTER — Ambulatory Visit: Payer: 59

## 2023-01-07 DIAGNOSIS — C3492 Malignant neoplasm of unspecified part of left bronchus or lung: Secondary | ICD-10-CM | POA: Diagnosis not present

## 2023-01-08 ENCOUNTER — Ambulatory Visit: Payer: 59

## 2023-01-11 ENCOUNTER — Ambulatory Visit: Payer: 59

## 2023-01-11 ENCOUNTER — Other Ambulatory Visit: Payer: Self-pay

## 2023-01-11 DIAGNOSIS — C3412 Malignant neoplasm of upper lobe, left bronchus or lung: Secondary | ICD-10-CM

## 2023-01-11 DIAGNOSIS — C3492 Malignant neoplasm of unspecified part of left bronchus or lung: Secondary | ICD-10-CM

## 2023-01-12 ENCOUNTER — Encounter (HOSPITAL_COMMUNITY): Payer: Self-pay | Admitting: Hematology

## 2023-01-12 ENCOUNTER — Ambulatory Visit: Payer: 59

## 2023-01-12 NOTE — Progress Notes (Signed)
[facility] [address], [place] [address]   Clinic Day:  01/13/23    Referring physician: Billie Lade, MD  Patient Care Team: Billie Lade, MD as PCP - General (Internal Medicine) [name], MD as PCP - Cardiology (Cardiology) [name], MD as Consulting Physician (Internal Medicine) [name], RN as Oncology Nurse Navigator (Medical Oncology) [name], MD as Medical Oncologist (Medical Oncology)   ASSESSMENT & PLAN:   Assessment:  1.  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  No weight loss or hemoptysis. - CT angio chest (11/30/2022): Left upper lobe mass with central necrosis and bulky mediastinal, left hilar and left lower neck adenopathy and lytic lesion in the right superior sternum. - Bronchoscopy (12/01/2022): Narrowing of LUL apical and posterior segment.  Otherwise normal-appearing bronchial trees. - LUL upper lobe biopsy (12/01/2022): Invasive moderately differentiated squamous cell carcinoma. - PET scan (12/16/2022): Left supraclavicular 3.2 cm mass, direct extension from mediastinal adenopathy.  Small right supraclavicular lymph node.  Large left lung mass 7.4 x 6.1 cm extending into the left hilum and AP window.  Prevascular and paratracheal lymph nodes and right paratracheal node.  Extensive involvement of sternum manubrium and first rib close to manubrium.  Soft tissue extension into the medial border of the overlying pectoralis muscle.  Discrete hypermetabolic right upper lobe nodule 10 mm.  No abdominal or pelvic metastatic disease. - He has received 1 dose of concurrent chemo XRT on 12/23/2022. - Left lung XRT 12 Gray in 4 fractions and left bronchus radiation 18 Gray in 6 fractions completed - Guardant360 (01/07/2023): PIK3CA, T p53, MSI high not detected. - NGS (01/03/2023): TMB-high.  MSI-stable.  PIK3CA and T p53  mutations present.  No other targetable mutations.  PD-L1 TPS 0%.  HER2 IHC 0. - Carboplatin, paclitaxel, durvalumab and tremelimumab cycle 1 started on 01/13/2023  2.  Social/family history: - Lives at home by himself.  His wife helps him but they are separated.  He is independent of ADLs and IADLs.  Retired from work in April 2024.  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  Current active smoker, 1 pack/day started at age 80 and quit at the time of diagnosis. - Sister had cancer.  2 paternal cousins had cancer.   Plan:  1.  Stage IV (T4 N2 M1) squamous cell lung cancer: - We discussed results of NGS and Guardant360. - No targetable mutations.  PD-L1 TPS 0%. - We talked about chemoimmunotherapy combination.  We discussed side effects in detail. - He will proceed with cycle 1 day 1 today. - Recommend home health evaluation for help at home.  He is separated from his wife and lives by himself and requires help in taking medications and supervision. - Reviewed labs today: Normal LFTs and creatinine.  CBC grossly normal.  TSH is 0.48. - He will be reevaluated in 3 weeks.  2.  Chest and neck tightness/pain: - Continue Norco 7.5/325 every 6 hours as needed.  Pain is well-controlled.  3.  Hypertension: - Continue to hold lisinopril and Toprol-XL.  Blood pressure today is 134/75.   Orders Placed This Encounter  Procedures   CBC with Differential (Cancer Center Only)    Standing Status:   Future    Standing Expiration Date:   02/04/2024   CMP (Cancer Center only)    Standing Status:  Future    Standing Expiration Date:   02/04/2024   Magnesium    Standing Status:   Future    Standing Expiration Date:   02/04/2024   CBC with Differential (Cancer Center Only)    Standing Status:   Future    Standing Expiration Date:   02/25/2024   CMP (Cancer Center only)    Standing Status:   Future    Standing Expiration Date:   02/25/2024   Magnesium    Standing Status:   Future     Standing Expiration Date:   02/25/2024   CBC with Differential (Cancer Center Only)    Standing Status:   Future    Standing Expiration Date:   03/17/2024   CMP (Cancer Center only)    Standing Status:   Future    Standing Expiration Date:   03/17/2024   Magnesium    Standing Status:   Future    Standing Expiration Date:   03/17/2024   CBC with Differential (Cancer Center Only)    Standing Status:   Future    Standing Expiration Date:   04/07/2024   CMP (Cancer Center only)    Standing Status:   Future    Standing Expiration Date:   04/07/2024   Magnesium    Standing Status:   Future    Standing Expiration Date:   04/07/2024   CBC with Differential (Cancer Center Only)    Standing Status:   Future    Standing Expiration Date:   05/05/2024   CMP (Cancer Center only)    Standing Status:   Future    Standing Expiration Date:   05/05/2024   Magnesium    Standing Status:   Future    Standing Expiration Date:   05/05/2024   Ambulatory referral to Home Health    Referral Priority:   Routine    Referral Type:   Home Health Care    Referral Reason:   Specialty Services Required    Requested Specialty:   Home Health Services    Number of Visits Requested:   1      I,Helena R Teague,acting as a scribe for [name], MD.,have documented all relevant documentation on the behalf of [name], MD,as directed by  [name], MD while in the presence of [name], MD.  I, [name] MD, have reviewed the above documentation for accuracy and completeness, and I agree with the above.     [name], MD   7/31/20246:35 PM  CHIEF COMPLAINT/PURPOSE OF CONSULT:   Diagnosis: Squamous Cell Carcinoma of Left Lung   Cancer Staging  Squamous cell lung cancer, left (HCC) Staging form: Lung, AJCC 8th Edition - Clinical stage from 12/22/2022: Stage IVA (cT4, cN3, cM1a) - Unsigned    Prior Therapy: none  Current Therapy: Chemoradiation  therapy   HISTORY OF PRESENT ILLNESS:   Oncology History  Squamous cell lung cancer, left (HCC)  12/22/2022 Initial Diagnosis   Squamous cell lung cancer, left (HCC)   12/25/2022 - 12/25/2022 Chemotherapy   Patient is on Treatment Plan : ESOPHAGUS Carboplatin + Paclitaxel Weekly X 6 Weeks with XRT     01/13/2023 -  Chemotherapy   Patient is on Treatment Plan : LUNG NSCLC Squamous Tremelimumab-actl (cycles 1,2,3,4,6) + Durvalumab + PAClitaxel + Carboplatin q21 days x 4 cycles / Durvalumab q28 days         Mahoney is a 62 y.o. male presenting to clinic today for evaluation of Squamous Cell Carcinoma of Left  Lung at the request of [facility].  Patient underwent an NM PET Skull Base to Thigh on 7/3 that showed large LEFT upper lobe mass consistent with primary bronchogenic carcinoma, extensive metastatic adenopathy to the AP window, bulky LEFT supraclavicular disease, direct invasion of the AP window and LEFT superior hilum, and direct skeletal invasion of the manubrium and first RIGHT rib costamanubrial junction.   Patient was seen in the ED at [facility] on 6/20 for chest pain and occasionally coughing up blood. CT of patient showed negative for PE. Symptoms improved on their own and patient was discharged.   Today, he states that he is doing well overall. His appetite level is at 70%. His energy level is at 60%.  INTERVAL HISTORY:   MAKEL ELTING is a 62 y.o. male is presenting to the clinic today for follow-up of Squamous Cell Carcinoma of Left Lung. He was last seen by me on 12/31/22.  Today, he states that he is doing well overall. His appetite level is at 75%. His energy level is at 75%. He is accompanied by his wife.  He notes he has mild chest pain. He notes he is taking Norco 2 pills 3x a day for his throat pain. He reports he does not have [place] and needs a refill. He notes that he was constipated, but that is has resolved. He currently takes Colace to treat constipation. He c/o  hemorrhoids and currently treats it with a cream. His wife reports that the cream does not seem like it is improving his hemorrhoids.   His wife notes that he used to take nitroglycerin, but he has not taken any since he ran out of the medication. His wife notes he does not take most of his medication correctly, though the patient reports he does, and would like for him to have Home Health.   PAST MEDICAL HISTORY:   Past Medical History: Past Medical History:  Diagnosis Date   Bronchitis    CAD (coronary artery disease)    a. cath 11/26/2014 EF 45%, moderate to severe inferior hypokinesis, 90% small D1, 100% mid to distal RCA with minimal collateral, 40% mid to distal LCx. Medical therapy as RCA occlusion appears to be completed event; DES to mid circumflex October 2019   Essential hypertension    Hyperlipidemia    Ischemic cardiomyopathy    NSTEMI (non-ST elevated myocardial infarction) (HCC)    11/26/14, 01/31/18    Surgical History: Past Surgical History:  Procedure Laterality Date   BRONCHIAL BIOPSY  12/01/2022   Procedure: BRONCHIAL BIOPSIES;  Surgeon: Martina Sinner, MD;  Location: [facility] ENDOSCOPY;  Service: Pulmonary;;   BRONCHIAL BRUSHINGS  12/01/2022   Procedure: BRONCHIAL BRUSHINGS;  Surgeon: Martina Sinner, MD;  Location: [facility] ENDOSCOPY;  Service: Pulmonary;;   BRONCHIAL WASHINGS  12/01/2022   Procedure: BRONCHIAL WASHINGS;  Surgeon: Martina Sinner, MD;  Location: [facility] ENDOSCOPY;  Service: Pulmonary;;   CARDIAC CATHETERIZATION Rodney/A 11/26/2014   Procedure: Left Heart Cath and Coronary Angiography;  Surgeon: Peter M Swaziland, MD;  Location: [facility] INVASIVE CV LAB;  Service: Cardiovascular;  Laterality: Rodney/A;   COLONOSCOPY     per patient: done in [place], age 78, couple of polyps.   COLONOSCOPY WITH PROPOFOL Rodney/A 10/08/2022   Procedure: COLONOSCOPY WITH PROPOFOL;  Surgeon: Lanelle Bal, DO;  Location: [facility] ENDO SUITE;  Service: Endoscopy;  Laterality: Rodney/A;  11:00 am   CORONARY STENT  INTERVENTION Rodney/A 01/31/2018   Procedure: CORONARY STENT INTERVENTION;  Surgeon: Nanetta Batty  J, MD;  Location: [facility] INVASIVE CV LAB;  Service: Cardiovascular;  Laterality: Rodney/A;   HEMOSTASIS CONTROL  12/01/2022   Procedure: HEMOSTASIS CONTROL;  Surgeon: Martina Sinner, MD;  Location: [facility] ENDOSCOPY;  Service: Pulmonary;;  cold saline   IR IMAGING GUIDED PORT INSERTION  12/23/2022   LEFT HEART CATH AND CORONARY ANGIOGRAPHY Rodney/A 01/31/2018   Procedure: LEFT HEART CATH AND CORONARY ANGIOGRAPHY;  Surgeon: Runell Gess, MD;  Location: [facility] INVASIVE CV LAB;  Service: Cardiovascular;  Laterality: Rodney/A;   NECK SURGERY  2005   VIDEO BRONCHOSCOPY Rodney/A 12/01/2022   Procedure: VIDEO BRONCHOSCOPY WITH FLUORO;  Surgeon: Martina Sinner, MD;  Location: [facility] ENDOSCOPY;  Service: Pulmonary;  Laterality: Rodney/A;    Social History: Social History   Socioeconomic History   Marital status: Married    Spouse name: Not on file   Number of children: Not on file   Years of education: Not on file   Highest education level: Not on file  Occupational History   Not on file  Tobacco Use   Smoking status: Every Day    Current packs/day: 1.00    Average packs/day: 1 pack/day for 42.0 years (42.0 ttl pk-yrs)    Types: Cigarettes   Smokeless tobacco: Never   Tobacco comments:    He used to quit and start back. He has not quit for the past 5 years.   Vaping Use   Vaping status: Never Used  Substance and Sexual Activity   Alcohol use: Yes    Comment: 10 cans of beer   Drug use: Yes    Types: Marijuana    Comment: smokes week here and there   Sexual activity: Yes    Birth control/protection: None  Other Topics Concern   Not on file  Social History Narrative   Works at [employer] and live with wife.    Social Determinants of Health   Financial Resource Strain: High Risk (12/31/2022)   Overall Financial Resource Strain (CARDIA)    Difficulty of Paying Living Expenses: Very hard  Food Insecurity: No Food  Insecurity (12/14/2022)   Hunger Vital Sign    Worried About Running Out of Food in the Last Year: Never true    Ran Out of Food in the Last Year: Never true  Transportation Needs: No Transportation Needs (12/14/2022)   PRAPARE - Administrator, Civil Service (Medical): No    Lack of Transportation (Non-Medical): No  Physical Activity: Not on file  Stress: Not on file  Social Connections: Not on file  Intimate Partner Violence: Not At Risk (12/14/2022)   Humiliation, Afraid, Rape, and Kick questionnaire    Fear of Current or Ex-Partner: No    Emotionally Abused: No    Physically Abused: No    Sexually Abused: No    Family History: Family History  Problem Relation Age of Onset   Diabetes Mother    Heart attack Father    Cancer Sister    Hypertension Brother    Colon cancer Neg Hx    Prostate cancer Neg Hx    Lung cancer Neg Hx     Current Medications:  Current Outpatient Medications:    acetaminophen (TYLENOL) 500 MG tablet, Take 500 mg by mouth every 6 (six) hours as needed for mild pain., Disp: , Rfl:    albuterol (VENTOLIN HFA) 108 (90 Base) MCG/ACT inhaler, Inhale 2 puffs into the lungs every 6 (six) hours as needed for wheezing or shortness of breath., Disp:  8 g, Rfl: 2   aluminum-magnesium hydroxide 200-200 MG/5ML suspension, Take 10 mLs by mouth every 6 (six) hours as needed for indigestion., Disp: 480 mL, Rfl: 2   aspirin EC 81 MG tablet, Take 1 tablet (81 mg total) by mouth daily. Swallow whole., Disp: 90 tablet, Rfl: 3   atorvastatin (LIPITOR) 80 MG tablet, Take 1 tablet (80 mg total) by mouth daily. TAKE 1 TABLET BY MOUTH EVERY DAY AT 6 PM (Patient taking differently: Take 80 mg by mouth at bedtime.), Disp: 90 tablet, Rfl: 1   CARBOPLATIN IV, Inject into the vein once a week., Disp: , Rfl:    dapagliflozin propanediol (FARXIGA) 10 MG TABS tablet, Take 1 tablet (10 mg total) by mouth daily before breakfast., Disp: 90 tablet, Rfl: 3   empagliflozin (JARDIANCE) 10  MG TABS tablet, Take 1 tablet (10 mg total) by mouth daily before breakfast., Disp: 30 tablet, Rfl: 3   ezetimibe (ZETIA) 10 MG tablet, Take 1 tablet (10 mg total) by mouth daily., Disp: 90 tablet, Rfl: 3   HYDROcodone-acetaminophen (NORCO) 7.5-325 MG tablet, Take 1 tablet by mouth every 6 (six) hours as needed for moderate pain., Disp: 120 tablet, Rfl: 0   lidocaine (XYLOCAINE) 2 % solution, SMARTSIG:By Mouth, Disp: , Rfl:    lisinopril (ZESTRIL) 10 MG tablet, Take 1 tablet by mouth once daily, Disp: 30 tablet, Rfl: 0   Menthol, Topical Analgesic, (BIOFREEZE EX), Apply 1 Application topically daily as needed (pain)., Disp: , Rfl:    metoprolol succinate (TOPROL-XL) 25 MG 24 hr tablet, Take 1 tablet by mouth once daily, Disp: 90 tablet, Rfl: 0   nitroGLYCERIN (NITROSTAT) 0.4 MG SL tablet, Place 1 tablet (0.4 mg total) under the tongue every 5 (five) minutes as needed., Disp: 25 tablet, Rfl: 0   PACLITAXEL IV, Inject into the vein once a week., Disp: , Rfl:    sildenafil (VIAGRA) 50 MG tablet, Take 50 mg by mouth daily as needed., Disp: , Rfl:    lidocaine-prilocaine (EMLA) cream, Apply to affected area once, Disp: 30 g, Rfl: 3   prochlorperazine (COMPAZINE) 10 MG tablet, Take 1 tablet (10 mg total) by mouth every 6 (six) hours as needed for nausea or vomiting., Disp: 60 tablet, Rfl: 3 No current facility-administered medications for this visit.  Facility-Administered Medications Ordered in Other Visits:    sodium chloride flush (NS) 0.9 % injection 10 mL, 10 mL, Intracatheter, PRN, [name], MD, 10 mL at 01/13/23 1815   Allergies: No Known Allergies  REVIEW OF SYSTEMS:   Review of Systems  Constitutional:  Negative for chills, fatigue and fever.  HENT:   Positive for sore throat. Negative for lump/mass, mouth sores, nosebleeds and trouble swallowing.        +throat pain, 7/10 severity  Eyes:  Negative for eye problems.  Respiratory:  Negative for cough and shortness of breath.    Cardiovascular:  Positive for chest pain. Negative for leg swelling and palpitations.  Gastrointestinal:  Positive for nausea. Negative for abdominal pain, constipation, diarrhea and vomiting.  Genitourinary:  Negative for bladder incontinence, difficulty urinating, dysuria, frequency, hematuria and nocturia.   Musculoskeletal:  Negative for arthralgias, back pain, flank pain, myalgias and neck pain.  Skin:  Negative for itching and rash.  Neurological:  Positive for dizziness. Negative for headaches and numbness.  Hematological:  Does not bruise/bleed easily.       +hemorrhoids, pain is a 7/10 severity  Psychiatric/Behavioral:  Negative for depression, sleep disturbance and suicidal ideas. The  patient is not nervous/anxious.   All other systems reviewed and are negative.    VITALS:   There were no vitals taken for this visit.  Wt Readings from Last 3 Encounters:  01/13/23 174 lb 6.4 oz (79.1 kg)  12/31/22 175 lb 12.8 oz (79.7 kg)  12/23/22 181 lb (82.1 kg)    There is no height or weight on file to calculate BMI.  Performance status (ECOG): 1 - Symptomatic but completely ambulatory  PHYSICAL EXAM:   Physical Exam Vitals and nursing note reviewed. Exam conducted with a chaperone present.  Constitutional:      Appearance: Normal appearance.  Cardiovascular:     Rate and Rhythm: Normal rate and regular rhythm.     Pulses: Normal pulses.     Heart sounds: Normal heart sounds.  Pulmonary:     Effort: Pulmonary effort is normal.     Breath sounds: Normal breath sounds.  Abdominal:     Palpations: Abdomen is soft. There is no hepatomegaly, splenomegaly or mass.     Tenderness: There is no abdominal tenderness.  Musculoskeletal:     Right lower leg: No edema.     Left lower leg: No edema.  Lymphadenopathy:     Cervical: No cervical adenopathy.     Right cervical: No superficial, deep or posterior cervical adenopathy.    Left cervical: No superficial, deep or posterior  cervical adenopathy.     Upper Body:     Right upper body: No supraclavicular or axillary adenopathy.     Left upper body: No supraclavicular or axillary adenopathy.  Neurological:     General: No focal deficit present.     Mental Status: He is alert and oriented to person, place, and time.  Psychiatric:        Mood and Affect: Mood normal.        Behavior: Behavior normal.     LABS:      Latest Ref Rng & Units 01/13/2023    7:55 AM 12/31/2022    8:10 AM 12/25/2022    8:09 AM  CBC  WBC 4.0 - 10.5 K/uL 6.1  6.9  9.6   Hemoglobin 13.0 - 17.0 g/dL 19.1  47.8  9.9   Hematocrit 39.0 - 52.0 % 35.2  33.6  30.4   Platelets 150 - 400 K/uL 352  408  393       Latest Ref Rng & Units 01/13/2023    7:55 AM 12/31/2022    8:10 AM 12/25/2022    8:09 AM  CMP  Glucose 70 - 99 mg/dL 295  621  308   BUN 8 - 23 mg/dL 11  13  16    Creatinine 0.61 - 1.24 mg/dL 6.57  8.46  9.62   Sodium 135 - 145 mmol/L 136  132  133   Potassium 3.5 - 5.1 mmol/L 3.5  3.8  3.8   Chloride 98 - 111 mmol/L 102  101  100   CO2 22 - 32 mmol/L 24  23  23    Calcium 8.9 - 10.3 mg/dL 8.9  8.7  8.6   Total Protein 6.5 - 8.1 g/dL 7.1  7.9  6.9   Total Bilirubin 0.3 - 1.2 mg/dL 0.4  0.5  0.4   Alkaline Phos 38 - 126 U/L 87  74  66   AST 15 - 41 U/L 25  19  15    ALT 0 - 44 U/L 35  32  21      No results  found for: "CEA1", "CEA" / No results found for: "CEA1", "CEA" Lab Results  Component Value Date   PSA1 0.6 07/31/2021   No results found for: "ZOX096" No results found for: "CAN125"  No results found for: "TOTALPROTELP", "ALBUMINELP", "A1GS", "A2GS", "BETS", "BETA2SER", "GAMS", "MSPIKE", "SPEI" No results found for: "TIBC", "FERRITIN", "IRONPCTSAT" No results found for: "LDH"   STUDIES:   MR BRAIN W WO CONTRAST  Result Date: 12/29/2022 CLINICAL DATA:  Provided history: Non-small cell lung cancer, staging. Malignant neoplasm of unspecified part of unspecified bronchus or lung. EXAM: MRI HEAD WITHOUT AND WITH  CONTRAST TECHNIQUE: Multiplanar, multiecho pulse sequences of the brain and surrounding structures were obtained without and with intravenous contrast. CONTRAST:  9 mL Vueway intravenous contrast. COMPARISON:  None. FINDINGS: Brain: No age advanced or lobar predominant parenchymal atrophy. Multifocal T2 FLAIR hyperintense signal abnormality within the cerebral white matter, mild but greater than expected for age. These signal changes are nonspecific, but compatible with chronic small vessel ischemic disease. Small chronic infarct within the right cerebellar hemisphere. There is no acute infarct. No evidence of an intracranial mass. No chronic intracranial blood products. No extra-axial fluid collection. No midline shift. No pathologic intracranial enhancement identified. Vascular: Maintained flow voids within the proximal large arterial vessels. Skull and upper cervical spine: No focal suspicious marrow lesion. Sinuses/Orbits: No mass or acute finding within the imaged orbits. Tiny mucous retention cyst within right sphenoid sinus. Minimal mucosal thickening within the bilateral maxillary sinuses. IMPRESSION: 1. No evidence of intracranial metastatic disease. 2. Chronic small vessel ischemic changes within the cerebral white matter, mild but greater than expected for age. 3. Small chronic infarct within the right cerebellar hemisphere. 4. Minor paranasal sinus disease. Electronically Signed   By: Jackey Loge D.O.   On: 12/29/2022 18:10   IR IMAGING GUIDED PORT INSERTION  Result Date: 12/23/2022 CLINICAL DATA:  Non-small cell left lung carcinoma, needs durable venous access for planned treatment regimen EXAM: TUNNELED PORT CATHETER PLACEMENT WITH ULTRASOUND AND FLUOROSCOPIC GUIDANCE FLUOROSCOPY: Radiation Exposure Index (as provided by the fluoroscopic device): 1 mGy air Kerma ANESTHESIA/SEDATION: Intravenous Fentanyl and Versed 1mg  were administered as conscious sedation during continuous monitoring of  the patient's level of consciousness and physiological / cardiorespiratory status by the radiology RN, with a total moderate sedation time of 15 minutes. TECHNIQUE: The procedure, risks, benefits, and alternatives were explained to the patient. Questions regarding the procedure were encouraged and answered. The patient understands and consents to the procedure. Patency of the right IJ vein was confirmed with ultrasound with image documentation. An appropriate skin site was determined. Skin site was marked. Region was prepped using maximum barrier technique including cap and mask, sterile gown, sterile gloves, large sterile sheet, and Chlorhexidine as cutaneous antisepsis. The region was infiltrated locally with 1% lidocaine. Under real-time ultrasound guidance, the right IJ vein was accessed with a 21 gauge micropuncture needle; the needle tip within the vein was confirmed with ultrasound image documentation. Needle was exchanged over a 018 guidewire for transitional dilator, and vascular measurement was performed. A small incision was made on the right anterior chest wall and a subcutaneous pocket fashioned. The power-injectable port was positioned and its catheter tunneled to the right IJ dermatotomy site. The transitional dilator was exchanged over an Amplatz wire for a peel-away sheath, through which the port catheter, which had been trimmed to the appropriate length, was advanced and positioned under fluoroscopy with its tip at the cavoatrial junction. Spot chest radiograph confirms good catheter position  and no pneumothorax. The port was flushed per protocol. The pocket was closed with deep interrupted and subcuticular continuous 3-0 Monocryl sutures. The incisions were covered with Dermabond then covered with a sterile dressing. The patient tolerated the procedure well. COMPLICATIONS: COMPLICATIONS None immediate IMPRESSION: Technically successful right IJ power-injectable port catheter placement. Ready for  routine use. Electronically Signed   By: Corlis Leak M.D.   On: 12/23/2022 15:30   NM PET Image Initial (PI) Skull Base To Thigh  Result Date: 12/21/2022 CLINICAL DATA:  Initial treatment strategy for non-small cell lung cancer. Chest wall invasion. EXAM: NUCLEAR MEDICINE PET SKULL BASE TO THIGH TECHNIQUE: 10.3 mCi F-18 FDG was injected intravenously. Full-ring PET imaging was performed from the skull base to thigh after the radiotracer. CT data was obtained and used for attenuation correction and anatomic localization. Fasting blood glucose: 79 mg/dl COMPARISON:  Chest CT 72/53/6644 FINDINGS: Mediastinal blood pool activity: SUV max Liver activity: SUV max NA NECK: Hypermetabolic LEFT supraclavicular mass measures 3.2 cm with SUV max equal 23.5. LEFT super clavicular mass is a direct extension from the mediastinal adenopathy. Smaller hypermetabolic RIGHT supraclavicular node on image 35 with SUV max equal 19. Incidental CT findings: None. CHEST: Large LEFT upper lobe mass measures 7.4 x 6.1 cm i with) ntense metabolic activity (SUV max equal 23. Mass extends to the LEFT hilum as well as to the AP window. Hypermetabolic prevascular and paratracheal nodes are present. Contralateral RIGHT paratracheal node measures 20 mm on image 43 with intense metabolic activity. There is extensive involvement of the sternum manubrium and first RIGHT rib costo manubrial. There is soft tissue extension into the medial border of the overlying pectoralis muscle (image 45 with SUV max equal 20) There is discrete hypermetabolic RIGHT upper lobe pulmonary nodule measuring 10 mm with SUV max equal 11 on image 52. There is an additional hypermetabolic lymph node along the descending thoracic aorta/lateral border image 56. Incidental CT findings: None. ABDOMEN/PELVIS: No abnormal hypermetabolic activity within the liver, pancreas, adrenal glands, or spleen. No hypermetabolic lymph nodes in the abdomen or pelvis. Incidental CT findings:  None. SKELETON: Direct invasion of the manubrium. No additional evidence skeletal metastasis Incidental CT findings: None. IMPRESSION: 1. 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 Direct invasion of the AP window and LEFT superior hilum. 2. Extensive metastatic adenopathy to the AP window, contralateral paratracheal nodes as well as bilateral supraclavicular nodes. Bulky LEFT supraclavicular disease. 3. Direct skeletal invasion of the manubrium and first RIGHT rib costamanubrial junction 4. Soft tissue extension into the medial border of the overlying RIGHT pectoralis muscle. 5. Discrete hypermetabolic RIGHT upper lobe pulmonary nodule consistent with metastatic disease. 6. No evidence metastatic disease in the abdomen pelvis. Electronically Signed   By: Genevive Bi M.D.   On: 12/21/2022 15:16

## 2023-01-13 ENCOUNTER — Inpatient Hospital Stay (HOSPITAL_BASED_OUTPATIENT_CLINIC_OR_DEPARTMENT_OTHER): Payer: 59 | Admitting: Hematology

## 2023-01-13 ENCOUNTER — Ambulatory Visit: Payer: 59

## 2023-01-13 ENCOUNTER — Inpatient Hospital Stay: Payer: 59

## 2023-01-13 ENCOUNTER — Encounter (HOSPITAL_COMMUNITY): Payer: Self-pay | Admitting: Hematology

## 2023-01-13 VITALS — BP 132/82 | HR 98 | Temp 97.4°F | Resp 20

## 2023-01-13 DIAGNOSIS — C3492 Malignant neoplasm of unspecified part of left bronchus or lung: Secondary | ICD-10-CM

## 2023-01-13 DIAGNOSIS — C3412 Malignant neoplasm of upper lobe, left bronchus or lung: Secondary | ICD-10-CM | POA: Diagnosis not present

## 2023-01-13 DIAGNOSIS — K209 Esophagitis, unspecified without bleeding: Secondary | ICD-10-CM | POA: Diagnosis not present

## 2023-01-13 DIAGNOSIS — I10 Essential (primary) hypertension: Secondary | ICD-10-CM | POA: Diagnosis not present

## 2023-01-13 DIAGNOSIS — Z5112 Encounter for antineoplastic immunotherapy: Secondary | ICD-10-CM | POA: Diagnosis not present

## 2023-01-13 DIAGNOSIS — Z79620 Long term (current) use of immunosuppressive biologic: Secondary | ICD-10-CM | POA: Diagnosis not present

## 2023-01-13 DIAGNOSIS — Z809 Family history of malignant neoplasm, unspecified: Secondary | ICD-10-CM | POA: Diagnosis not present

## 2023-01-13 DIAGNOSIS — Z510 Encounter for antineoplastic radiation therapy: Secondary | ICD-10-CM | POA: Diagnosis not present

## 2023-01-13 DIAGNOSIS — Z5111 Encounter for antineoplastic chemotherapy: Secondary | ICD-10-CM | POA: Diagnosis not present

## 2023-01-13 DIAGNOSIS — F17210 Nicotine dependence, cigarettes, uncomplicated: Secondary | ICD-10-CM | POA: Diagnosis not present

## 2023-01-13 DIAGNOSIS — I871 Compression of vein: Secondary | ICD-10-CM | POA: Diagnosis not present

## 2023-01-13 DIAGNOSIS — Z79630 Long term (current) use of alkylating agent: Secondary | ICD-10-CM | POA: Diagnosis not present

## 2023-01-13 DIAGNOSIS — Z79633 Long term (current) use of mitotic inhibitor: Secondary | ICD-10-CM | POA: Diagnosis not present

## 2023-01-13 DIAGNOSIS — R079 Chest pain, unspecified: Secondary | ICD-10-CM | POA: Diagnosis not present

## 2023-01-13 DIAGNOSIS — Z923 Personal history of irradiation: Secondary | ICD-10-CM | POA: Diagnosis not present

## 2023-01-13 LAB — CBC WITH DIFFERENTIAL/PLATELET
Abs Immature Granulocytes: 0.02 10*3/uL (ref 0.00–0.07)
Basophils Absolute: 0 10*3/uL (ref 0.0–0.1)
Basophils Relative: 1 %
Eosinophils Absolute: 0.1 10*3/uL (ref 0.0–0.5)
Eosinophils Relative: 2 %
HCT: 35.2 % — ABNORMAL LOW (ref 39.0–52.0)
Hemoglobin: 11.5 g/dL — ABNORMAL LOW (ref 13.0–17.0)
Immature Granulocytes: 0 %
Lymphocytes Relative: 25 %
Lymphs Abs: 1.5 10*3/uL (ref 0.7–4.0)
MCH: 28.4 pg (ref 26.0–34.0)
MCHC: 32.7 g/dL (ref 30.0–36.0)
MCV: 86.9 fL (ref 80.0–100.0)
Monocytes Absolute: 0.7 10*3/uL (ref 0.1–1.0)
Monocytes Relative: 12 %
Neutro Abs: 3.7 10*3/uL (ref 1.7–7.7)
Neutrophils Relative %: 60 %
Platelets: 352 10*3/uL (ref 150–400)
RBC: 4.05 MIL/uL — ABNORMAL LOW (ref 4.22–5.81)
RDW: 15.5 % (ref 11.5–15.5)
WBC: 6.1 10*3/uL (ref 4.0–10.5)
nRBC: 0 % (ref 0.0–0.2)

## 2023-01-13 LAB — COMPREHENSIVE METABOLIC PANEL
ALT: 35 U/L (ref 0–44)
AST: 25 U/L (ref 15–41)
Albumin: 3.3 g/dL — ABNORMAL LOW (ref 3.5–5.0)
Alkaline Phosphatase: 87 U/L (ref 38–126)
Anion gap: 10 (ref 5–15)
BUN: 11 mg/dL (ref 8–23)
CO2: 24 mmol/L (ref 22–32)
Calcium: 8.9 mg/dL (ref 8.9–10.3)
Chloride: 102 mmol/L (ref 98–111)
Creatinine, Ser: 0.6 mg/dL — ABNORMAL LOW (ref 0.61–1.24)
GFR, Estimated: >60 mL/min (ref >60)
Glucose, Bld: 106 mg/dL — ABNORMAL HIGH (ref 70–99)
Potassium: 3.5 mmol/L (ref 3.5–5.1)
Sodium: 136 mmol/L (ref 135–145)
Total Bilirubin: 0.4 mg/dL (ref 0.3–1.2)
Total Protein: 7.1 g/dL (ref 6.5–8.1)

## 2023-01-13 LAB — TSH: TSH: 0.481 u[IU]/mL (ref 0.350–4.500)

## 2023-01-13 LAB — MAGNESIUM: Magnesium: 2.2 mg/dL (ref 1.7–2.4)

## 2023-01-13 MED ORDER — SODIUM CHLORIDE 0.9 % IV SOLN
175.0000 mg/m2 | Freq: Once | INTRAVENOUS | Status: AC
  Administered 2023-01-13: 336 mg via INTRAVENOUS
  Filled 2023-01-13: qty 56

## 2023-01-13 MED ORDER — LIDOCAINE-PRILOCAINE 2.5-2.5 % EX CREA: TOPICAL_CREAM | CUTANEOUS | 3 refills | Status: DC

## 2023-01-13 MED ORDER — HEPARIN SOD (PORK) LOCK FLUSH 100 UNIT/ML IV SOLN
500.0000 [IU] | Freq: Once | INTRAVENOUS | Status: AC | PRN
  Administered 2023-01-13: 500 [IU]

## 2023-01-13 MED ORDER — SODIUM CHLORIDE 0.9 % IV SOLN: Freq: Once | INTRAVENOUS | Status: AC

## 2023-01-13 MED ORDER — SODIUM CHLORIDE 0.9% FLUSH
10.0000 mL | INTRAVENOUS | Status: DC | PRN
  Administered 2023-01-13: 10 mL

## 2023-01-13 MED ORDER — SODIUM CHLORIDE 0.9% FLUSH
10.0000 mL | Freq: Once | INTRAVENOUS | Status: AC
  Administered 2023-01-13: 10 mL via INTRAVENOUS

## 2023-01-13 MED ORDER — SODIUM CHLORIDE 0.9 % IV SOLN
1500.0000 mg | Freq: Once | INTRAVENOUS | Status: AC
  Administered 2023-01-13: 1500 mg via INTRAVENOUS
  Filled 2023-01-13: qty 30

## 2023-01-13 MED ORDER — SODIUM CHLORIDE 0.9 % IV SOLN
10.0000 mg | Freq: Once | INTRAVENOUS | Status: AC
  Administered 2023-01-13: 10 mg via INTRAVENOUS
  Filled 2023-01-13: qty 10

## 2023-01-13 MED ORDER — TREMELIMUMAB-ACTL CHEMO INJECTION 25 MG/1.25ML
75.0000 mg | Freq: Once | INTRAVENOUS | Status: AC
  Administered 2023-01-13: 75 mg via INTRAVENOUS
  Filled 2023-01-13: qty 3.75

## 2023-01-13 MED ORDER — SODIUM CHLORIDE 0.9 % IV SOLN
150.0000 mg | Freq: Once | INTRAVENOUS | Status: AC
  Administered 2023-01-13: 150 mg via INTRAVENOUS
  Filled 2023-01-13: qty 5

## 2023-01-13 MED ORDER — PALONOSETRON HCL INJECTION 0.25 MG/5ML
0.2500 mg | Freq: Once | INTRAVENOUS | Status: AC
  Administered 2023-01-13: 0.25 mg via INTRAVENOUS
  Filled 2023-01-13: qty 5

## 2023-01-13 MED ORDER — CETIRIZINE HCL 10 MG/ML IV SOLN
10.0000 mg | Freq: Once | INTRAVENOUS | Status: AC
  Administered 2023-01-13: 10 mg via INTRAVENOUS
  Filled 2023-01-13: qty 1

## 2023-01-13 MED ORDER — FAMOTIDINE IN NACL 20-0.9 MG/50ML-% IV SOLN
20.0000 mg | Freq: Once | INTRAVENOUS | Status: AC
  Administered 2023-01-13: 20 mg via INTRAVENOUS
  Filled 2023-01-13: qty 50

## 2023-01-13 MED ORDER — SODIUM CHLORIDE 0.9 % IV SOLN
664.5000 mg | Freq: Once | INTRAVENOUS | Status: AC
  Administered 2023-01-13: 660 mg via INTRAVENOUS
  Filled 2023-01-13: qty 66

## 2023-01-13 MED ORDER — PROCHLORPERAZINE MALEATE 10 MG PO TABS: 10.0000 mg | ORAL_TABLET | Freq: Four times a day (QID) | ORAL | 3 refills | Status: DC | PRN

## 2023-01-13 NOTE — Progress Notes (Signed)
Pharmacist Chemotherapy Monitoring - Initial Assessment    Anticipated start date: 01/13/2023   The following has been reviewed per standard work regarding the patient's treatment regimen: The patient's diagnosis, treatment plan and drug doses, and organ/hematologic function Lab orders and baseline tests specific to treatment regimen  The treatment plan start date, drug sequencing, and pre-medications Prior authorization status  Patient's documented medication list, including drug-drug interaction screen and prescriptions for anti-emetics and supportive care specific to the treatment regimen The drug concentrations, fluid compatibility, administration routes, and timing of the medications to be used The patient's access for treatment and lifetime cumulative dose history, if applicable  The patient's medication allergies and previous infusion related reactions, if applicable   Changes made to treatment plan:  N/A  Follow up needed:  N/A   Stephens Shire, [facility], 01/13/2023  9:07 AM

## 2023-01-13 NOTE — Progress Notes (Signed)
Patient has been examined by Dr. Katragadda. Vital signs and labs have been reviewed by MD - ANC, Creatinine, LFTs, hemoglobin, and platelets are within treatment parameters per M.D. - pt may proceed with treatment.  Primary RN and pharmacy notified.  

## 2023-01-13 NOTE — Progress Notes (Signed)
D1C1 Imjudo, Imfinzi, Taxol, Carboplatin given today per MD orders.  Stable during infusion without adverse affects.  Vital signs stable.  No complaints at this time.  Discharge from clinic ambulatory in stable condition.  Alert and oriented X 3.  Follow up with [facility] as scheduled.

## 2023-01-13 NOTE — Progress Notes (Signed)
Referral placed to [facility] per request of Dr Ellin Saba.

## 2023-01-13 NOTE — Patient Instructions (Signed)
[facility] [place]  Discharge Instructions: Thank you for choosing [facility] to provide your oncology and hematology care.  If you have a lab appointment with the Cancer Center - please note that after April 8th, 2024, all labs will be drawn in the cancer center.  You do not have to check in or register with the main entrance as you have in the past but will complete your check-in in the cancer center.  Wear comfortable clothing and clothing appropriate for easy access to any Portacath or PICC line.   We strive to give you quality time with your provider. You may need to reschedule your appointment if you arrive late (15 or more minutes).  Arriving late affects you and other patients whose appointments are after yours.  Also, if you miss three or more appointments without notifying the office, you may be dismissed from the clinic at the provider's discretion.      For prescription refill requests, have your pharmacy contact our office and allow 72 hours for refills to be completed.    Today you received the following chemotherapy and/or immunotherapy agents   To help prevent nausea and vomiting after your treatment, we encourage you to take your nausea medication as directed.  BELOW ARE SYMPTOMS THAT SHOULD BE REPORTED IMMEDIATELY: *FEVER GREATER THAN 100.4 F (38 C) OR HIGHER *CHILLS OR SWEATING *NAUSEA AND VOMITING THAT IS NOT CONTROLLED WITH YOUR NAUSEA MEDICATION *UNUSUAL SHORTNESS OF BREATH *UNUSUAL BRUISING OR BLEEDING *URINARY PROBLEMS (pain or burning when urinating, or frequent urination) *BOWEL PROBLEMS (unusual diarrhea, constipation, pain near the anus) TENDERNESS IN MOUTH AND THROAT WITH OR WITHOUT PRESENCE OF ULCERS (sore throat, sores in mouth, or a toothache) UNUSUAL RASH, SWELLING OR PAIN  UNUSUAL VAGINAL DISCHARGE OR ITCHING   Items with * indicate a potential emergency and should be followed up as soon as possible or go to the Emergency  Department if any problems should occur.  Please show the CHEMOTHERAPY ALERT CARD or IMMUNOTHERAPY ALERT CARD at check-in to the Emergency Department and triage nurse.  Should you have questions after your visit or need to cancel or reschedule your appointment, please contact [facility] [place] [phone]  and follow the prompts.  Office hours are 8:00 a.m. to 4:30 p.m. Monday - Friday. Please note that voicemails left after 4:00 p.m. may not be returned until the following business day.  We are closed weekends and major holidays. You have access to a nurse at all times for urgent questions. Please call the main number to the clinic [phone] and follow the prompts.  For any non-urgent questions, you may also contact your provider using MyChart. We now offer e-Visits for anyone 18 and older to request care online for non-urgent symptoms. For details visit [email or web address].   Also download the MyChart app! Go to the app store, search "MyChart", open the app, select [place], and log in with your MyChart username and password.   

## 2023-01-13 NOTE — Patient Instructions (Signed)
[facility] Discharge Instructions   You were seen and examined today by Dr. Ellin Saba.  He reviewed the results of your lab work which are normal/stable.   We will proceed with your treatment today.   Take these medication daily as prescribed:  Zetia 10 mg; Jardience 10 mg; Farxiga 10 mg; Lipitor 80 mg; aspirin 81 mg. All other medications are taken on an as needed basis.   Return as scheduled.    Thank you for choosing [facility] to provide your oncology and hematology care.  To afford each patient quality time with our provider, please arrive at least 15 minutes before your scheduled appointment time.   If you have a lab appointment with the Cancer Center please come in thru the Main Entrance and check in at the main information desk.  You need to re-schedule your appointment should you arrive 10 or more minutes late.  We strive to give you quality time with our providers, and arriving late affects you and other patients whose appointments are after yours.  Also, if you no show three or more times for appointments you may be dismissed from the clinic at the providers discretion.     Again, thank you for choosing [facility].  Our hope is that these requests will decrease the amount of time that you wait before being seen by our physicians.       _____________________________________________________________  Should you have questions after your visit to [facility], please contact our office at [phone] and follow the prompts.  Our office hours are 8:00 a.m. and 4:30 p.m. Monday - Friday.  Please note that voicemails left after 4:00 p.m. may not be returned until the following business day.  We are closed weekends and major holidays.  You do have access to a nurse 24-7, just call the main number to the clinic [phone] and do not press any options, hold on the line and a nurse will  answer the phone.    For prescription refill requests, have your pharmacy contact our office and allow 72 hours.    Due to Covid, you will need to wear a mask upon entering the hospital. If you do not have a mask, a mask will be given to you at the Main Entrance upon arrival. For doctor visits, patients may have 1 support person age 15 or older with them. For treatment visits, patients can not have anyone with them due to social distancing guidelines and our immunocompromised population.

## 2023-01-13 NOTE — Progress Notes (Signed)
Patients port flushed without difficulty.  Good blood return noted with no bruising or swelling noted at site.  Patient to remain accessed for treatment.  

## 2023-01-14 ENCOUNTER — Ambulatory Visit: Payer: 59

## 2023-01-14 ENCOUNTER — Telehealth: Payer: Self-pay

## 2023-01-14 ENCOUNTER — Inpatient Hospital Stay: Payer: 59

## 2023-01-14 ENCOUNTER — Inpatient Hospital Stay: Payer: 59 | Admitting: Hematology

## 2023-01-14 NOTE — Telephone Encounter (Signed)
Chemotherapy 24 hour follow up.  Patient stated no problems but was concerned he had not had a bowel movement this morning.  Last bowel movement was yesterday.  Reviewed use of stool softener and after hour telephone number with understanding verbalized.  Patient to return Friday for injection and will let us know if he had a bowel movement.

## 2023-01-15 ENCOUNTER — Inpatient Hospital Stay: Payer: 59 | Attending: Hematology

## 2023-01-15 ENCOUNTER — Telehealth: Payer: Self-pay | Admitting: Internal Medicine

## 2023-01-15 ENCOUNTER — Ambulatory Visit: Payer: 59

## 2023-01-15 ENCOUNTER — Other Ambulatory Visit: Payer: Self-pay

## 2023-01-15 ENCOUNTER — Other Ambulatory Visit: Payer: Self-pay | Admitting: Nurse Practitioner

## 2023-01-15 ENCOUNTER — Ambulatory Visit: Payer: PRIVATE HEALTH INSURANCE | Admitting: Internal Medicine

## 2023-01-15 VITALS — BP 144/77 | HR 78 | Temp 98.6°F | Resp 18

## 2023-01-15 DIAGNOSIS — F17210 Nicotine dependence, cigarettes, uncomplicated: Secondary | ICD-10-CM | POA: Diagnosis not present

## 2023-01-15 DIAGNOSIS — Z5189 Encounter for other specified aftercare: Secondary | ICD-10-CM | POA: Insufficient documentation

## 2023-01-15 DIAGNOSIS — Z809 Family history of malignant neoplasm, unspecified: Secondary | ICD-10-CM | POA: Diagnosis not present

## 2023-01-15 DIAGNOSIS — Z5112 Encounter for antineoplastic immunotherapy: Secondary | ICD-10-CM | POA: Insufficient documentation

## 2023-01-15 DIAGNOSIS — Z5111 Encounter for antineoplastic chemotherapy: Secondary | ICD-10-CM | POA: Insufficient documentation

## 2023-01-15 DIAGNOSIS — C3412 Malignant neoplasm of upper lobe, left bronchus or lung: Secondary | ICD-10-CM | POA: Insufficient documentation

## 2023-01-15 DIAGNOSIS — I10 Essential (primary) hypertension: Secondary | ICD-10-CM

## 2023-01-15 DIAGNOSIS — R079 Chest pain, unspecified: Secondary | ICD-10-CM | POA: Diagnosis not present

## 2023-01-15 DIAGNOSIS — R112 Nausea with vomiting, unspecified: Secondary | ICD-10-CM | POA: Diagnosis not present

## 2023-01-15 DIAGNOSIS — C3492 Malignant neoplasm of unspecified part of left bronchus or lung: Secondary | ICD-10-CM

## 2023-01-15 DIAGNOSIS — E782 Mixed hyperlipidemia: Secondary | ICD-10-CM

## 2023-01-15 MED ORDER — EMPAGLIFLOZIN 10 MG PO TABS: 10.0000 mg | ORAL_TABLET | Freq: Every day | ORAL | 3 refills | Status: DC

## 2023-01-15 MED ORDER — PEGFILGRASTIM-JMDB 6 MG/0.6ML ~~LOC~~ SOSY
6.0000 mg | PREFILLED_SYRINGE | Freq: Once | SUBCUTANEOUS | Status: AC
  Administered 2023-01-15: 6 mg via SUBCUTANEOUS

## 2023-01-15 MED ORDER — METOPROLOL SUCCINATE ER 25 MG PO TB24
25.0000 mg | ORAL_TABLET | Freq: Every day | ORAL | 0 refills | Status: DC
Start: 2023-01-15 — End: 2023-01-21

## 2023-01-15 NOTE — Telephone Encounter (Signed)
[facility] pharmacy called has questions on patient empagliflozin (JARDIANCE) 10 MG call back # [phone].

## 2023-01-15 NOTE — Telephone Encounter (Signed)
Patient called said he needs refills on all of his medication. Patient not sure if any other meds or not send whatever Dr Durwin Nora needs patient to be on. Patient is out of all his meds.  metoprolol succinate (TOPROL-XL) 25 MG 24 hr tablet [[id number]]   empagliflozin (JARDIANCE) 10 MG TABS tablet [[id number]]   Pharmacy  [facility]

## 2023-01-15 NOTE — Telephone Encounter (Signed)
Spoke with pharmacy and tried calling the patient but no answer.

## 2023-01-15 NOTE — Telephone Encounter (Signed)
Refills sent

## 2023-01-15 NOTE — Progress Notes (Signed)
Patient tolerated Fulphila injection with no complaints voiced.  Site clean and dry with no bruising or swelling noted.  No complaints of pain.  Discharged with vital signs stable and no signs or symptoms of distress noted.  

## 2023-01-15 NOTE — Patient Instructions (Signed)
[facility]  Discharge Instructions: Thank you for choosing [facility] to provide your oncology and hematology care.  If you have a lab appointment with the Cancer Center - please note that after April 8th, 2024, all labs will be drawn in the cancer center.  You do not have to check in or register with the main entrance as you have in the past but will complete your check-in in the cancer center.  Wear comfortable clothing and clothing appropriate for easy access to any Portacath or PICC line.   We strive to give you quality time with your provider. You may need to reschedule your appointment if you arrive late (15 or more minutes).  Arriving late affects you and other patients whose appointments are after yours.  Also, if you miss three or more appointments without notifying the office, you may be dismissed from the clinic at the provider's discretion.      For prescription refill requests, have your pharmacy contact our office and allow 72 hours for refills to be completed.    Today you received the following chemotherapy and/or immunotherapy agents Fulphila.  Pegfilgrastim Injection What is this medication? PEGFILGRASTIM (PEG fil gra stim) lowers the risk of infection in people who are receiving chemotherapy. It works by Systems analyst make more white blood cells, which protects your body from infection. It may also be used to help people who have been exposed to high doses of radiation. This medicine may be used for other purposes; ask your health care provider or pharmacist if you have questions. COMMON BRAND NAME(S): Cherly Hensen, Neulasta, Nyvepria, Stimufend, UDENYCA, UDENYCA ONBODY, Ziextenzo What should I tell my care team before I take this medication? They need to know if you have any of these conditions: Kidney disease Latex allergy Ongoing radiation therapy Sickle cell disease Skin reactions to acrylic adhesives (On-Body Injector  only) An unusual or allergic reaction to pegfilgrastim, filgrastim, other medications, foods, dyes, or preservatives Pregnant or trying to get pregnant Breast-feeding How should I use this medication? This medication is for injection under the skin. If you get this medication at home, you will be taught how to prepare and give the pre-filled syringe or how to use the On-body Injector. Refer to the patient Instructions for Use for detailed instructions. Use exactly as directed. Tell your care team immediately if you suspect that the On-body Injector may not have performed as intended or if you suspect the use of the On-body Injector resulted in a missed or partial dose. It is important that you put your used needles and syringes in a special sharps container. Do not put them in a trash can. If you do not have a sharps container, call your pharmacist or care team to get one. Talk to your care team about the use of this medication in children. While this medication may be prescribed for selected conditions, precautions do apply. Overdosage: If you think you have taken too much of this medicine contact a poison control center or emergency room at once. NOTE: This medicine is only for you. Do not share this medicine with others. What if I miss a dose? It is important not to miss your dose. Call your care team if you miss your dose. If you miss a dose due to an On-body Injector failure or leakage, a new dose should be administered as soon as possible using a single prefilled syringe for manual use. What may interact with this medication? Interactions  have not been studied. This list may not describe all possible interactions. Give your health care provider a list of all the medicines, herbs, non-prescription drugs, or dietary supplements you use. Also tell them if you smoke, drink alcohol, or use illegal drugs. Some items may interact with your medicine. What should I watch for while using this  medication? Your condition will be monitored carefully while you are receiving this medication. You may need blood work done while you are taking this medication. Talk to your care team about your risk of cancer. You may be more at risk for certain types of cancer if you take this medication. If you are going to need a MRI, CT scan, or other procedure, tell your care team that you are using this medication (On-Body Injector only). What side effects may I notice from receiving this medication? Side effects that you should report to your care team as soon as possible: Allergic reactions--skin rash, itching, hives, swelling of the face, lips, tongue, or throat Capillary leak syndrome--stomach or muscle pain, unusual weakness or fatigue, feeling faint or lightheaded, decrease in the amount of urine, swelling of the ankles, hands, or feet, trouble breathing High white blood cell level--fever, fatigue, trouble breathing, night sweats, change in vision, weight loss Inflammation of the aorta--fever, fatigue, back, chest, or stomach pain, severe headache Kidney injury (glomerulonephritis)--decrease in the amount of urine, red or dark brown urine, foamy or bubbly urine, swelling of the ankles, hands, or feet Shortness of breath or trouble breathing Spleen injury--pain in upper left stomach or shoulder Unusual bruising or bleeding Side effects that usually do not require medical attention (report to your care team if they continue or are bothersome): Bone pain Pain in the hands or feet This list may not describe all possible side effects. Call your doctor for medical advice about side effects. You may report side effects to FDA at 1-800-FDA-1088. Where should I keep my medication? Keep out of the reach of children. If you are using this medication at home, you will be instructed on how to store it. Throw away any unused medication after the expiration date on the label. NOTE: This sheet is a summary. It  may not cover all possible information. If you have questions about this medicine, talk to your doctor, pharmacist, or health care provider.  2024 Elsevier/Gold Standard (2021-05-02 00:00:00)        To help prevent nausea and vomiting after your treatment, we encourage you to take your nausea medication as directed.  BELOW ARE SYMPTOMS THAT SHOULD BE REPORTED IMMEDIATELY: *FEVER GREATER THAN 100.4 F (38 C) OR HIGHER *CHILLS OR SWEATING *NAUSEA AND VOMITING THAT IS NOT CONTROLLED WITH YOUR NAUSEA MEDICATION *UNUSUAL SHORTNESS OF BREATH *UNUSUAL BRUISING OR BLEEDING *URINARY PROBLEMS (pain or burning when urinating, or frequent urination) *BOWEL PROBLEMS (unusual diarrhea, constipation, pain near the anus) TENDERNESS IN MOUTH AND THROAT WITH OR WITHOUT PRESENCE OF ULCERS (sore throat, sores in mouth, or a toothache) UNUSUAL RASH, SWELLING OR PAIN  UNUSUAL VAGINAL DISCHARGE OR ITCHING   Items with * indicate a potential emergency and should be followed up as soon as possible or go to the Emergency Department if any problems should occur.  Please show the CHEMOTHERAPY ALERT CARD or IMMUNOTHERAPY ALERT CARD at check-in to the Emergency Department and triage nurse.  Should you have questions after your visit or need to cancel or reschedule your appointment, please contact [facility] [phone]  and follow the prompts.  Office hours  are 8:00 a.m. to 4:30 p.m. Monday - Friday. Please note that voicemails left after 4:00 p.m. may not be returned until the following business day.  We are closed weekends and major holidays. You have access to a nurse at all times for urgent questions. Please call the main number to the clinic [phone] and follow the prompts.  For any non-urgent questions, you may also contact your provider using MyChart. We now offer e-Visits for anyone 51 and older to request care online for non-urgent symptoms. For details visit  [email or web address].   Also download the MyChart app! Go to the app store, search "MyChart", open the app, select [place], and log in with your MyChart username and password.

## 2023-01-18 ENCOUNTER — Ambulatory Visit: Payer: 59

## 2023-01-18 NOTE — Telephone Encounter (Signed)
Patient came by the office and is completely out of this medication asked to resend in   metoprolol succinate (TOPROL-XL) 25 MG 24 hr tablet [[id number]]    empagliflozin (JARDIANCE) 10 MG TABS tablet [[id number]]   [facility]

## 2023-01-18 NOTE — Telephone Encounter (Signed)
Pls let pt know that both meds were called in fri and should be ready for pickup

## 2023-01-19 ENCOUNTER — Telehealth: Payer: Self-pay | Admitting: *Deleted

## 2023-01-19 ENCOUNTER — Ambulatory Visit: Payer: 59

## 2023-01-19 ENCOUNTER — Ambulatory Visit (INDEPENDENT_AMBULATORY_CARE_PROVIDER_SITE_OTHER): Payer: 59 | Admitting: Internal Medicine

## 2023-01-19 ENCOUNTER — Encounter: Payer: Self-pay | Admitting: Internal Medicine

## 2023-01-19 VITALS — BP 123/81 | HR 109 | Ht 66.0 in | Wt 173.4 lb

## 2023-01-19 DIAGNOSIS — H02845 Edema of left lower eyelid: Secondary | ICD-10-CM | POA: Diagnosis not present

## 2023-01-19 DIAGNOSIS — H02842 Edema of right lower eyelid: Secondary | ICD-10-CM

## 2023-01-19 NOTE — Patient Instructions (Signed)
It was a pleasure to see you today.  Thank you for giving [place] the opportunity to be involved in your care.  Below is a brief recap of your visit and next steps.  We will plan to see you again in September.  Summary I recommend using a warm compress for your eyes I do not see a need for antibiotics currently Follow up in September for routine care.

## 2023-01-19 NOTE — Assessment & Plan Note (Signed)
Presenting today for an acute visit with concern for an infection in his eyes.  On exam, there is lower eyelid edema present in both eyes.  No erythema is present.  There is no conjunctival injection or discharge present.  He denies pain, photophobia, and visual disturbance.  Overall, his exam is reassuring.  I have recommended as needed application of a warm compress to each eye.  He was instructed to return to care if his symptoms worsen or fail to improve.  Otherwise, he will return for previously scheduled routine follow-up in September.

## 2023-01-19 NOTE — Progress Notes (Signed)
   Acute Office Visit  Subjective:     Patient ID: Rodney Mahoney, male    DOB: [birth year], 62 y.o.   MRN: [id number]  Chief Complaint  Patient presents with   Facial Swelling    Swollen eyes. Noticed on 01/16/23 and had a shot for infection around port.    Rodney Mahoney presents today for an acute visit endorsing swollen eyelids bilaterally.  He states that symptoms developed on Saturday (8/3) after he received a Fulphila injection the previous day (8/2).  He denies pain in his eyes, photophobia, conjunctival injection, and discharge.  There have been no visual changes.  Overall, Rodney Mahoney feels as though his symptoms are improving.  He does not have additional concerns to discuss today  Review of Systems  Eyes:  Negative for blurred vision, double vision, photophobia, pain, discharge and redness.       Swollen lower eyelids bilaterally      Objective:    BP 123/81   Pulse (!) 109   Ht 5\' 6"  (1.676 m)   Wt 173 lb 6.4 oz (78.7 kg)   SpO2 94%   BMI 27.99 kg/m   Physical Exam Vitals reviewed.  Constitutional:      Appearance: Normal appearance.  HENT:     Head: Normocephalic and atraumatic.     Nose: Nose normal.     Mouth/Throat:     Mouth: Mucous membranes are moist.     Pharynx: Oropharynx is clear. No oropharyngeal exudate or posterior oropharyngeal erythema.  Eyes:     General: No scleral icterus.       Right eye: No discharge.        Left eye: No discharge.     Extraocular Movements: Extraocular movements intact.     Conjunctiva/sclera: Conjunctivae normal.     Pupils: Pupils are equal, round, and reactive to light.     Comments: Lower eyelid edema bilaterally  Cardiovascular:     Rate and Rhythm: Normal rate and regular rhythm.     Pulses: Normal pulses.     Heart sounds: Normal heart sounds.  Pulmonary:     Effort: Pulmonary effort is normal.     Breath sounds: Normal breath sounds.  Abdominal:     General: Abdomen is flat. Bowel sounds are normal.      Palpations: Abdomen is soft.  Neurological:     Mental Status: He is alert.       Assessment & Plan:   Problem List Items Addressed This Visit       Edema of lower eyelid of both eyes - Primary    Presenting today for an acute visit with concern for an infection in his eyes.  On exam, there is lower eyelid edema present in both eyes.  No erythema is present.  There is no conjunctival injection or discharge present.  He denies pain, photophobia, and visual disturbance.  Overall, his exam is reassuring.  I have recommended as needed application of a warm compress to each eye.  He was instructed to return to care if his symptoms worsen or fail to improve.  Otherwise, he will return for previously scheduled routine follow-up in September.      Return if symptoms worsen or fail to improve.  Billie Lade, MD

## 2023-01-19 NOTE — Telephone Encounter (Signed)
Patient called to advise that he thinks he has an infection in his eye, however does not have any drainage or a fever.  Advised to use warm compress and let us know if it worsens.  Dr. Ellin Saba made aware and agrees with plan.

## 2023-01-20 ENCOUNTER — Emergency Department (HOSPITAL_COMMUNITY): Payer: 59

## 2023-01-20 ENCOUNTER — Observation Stay (HOSPITAL_COMMUNITY)
Admission: EM | Admit: 2023-01-20 | Discharge: 2023-01-21 | Disposition: A | Discharge status: Home / Self Care | Payer: 59 | Attending: Family Medicine | Admitting: Family Medicine

## 2023-01-20 ENCOUNTER — Other Ambulatory Visit: Payer: 59

## 2023-01-20 ENCOUNTER — Ambulatory Visit: Payer: 59

## 2023-01-20 ENCOUNTER — Other Ambulatory Visit: Payer: Self-pay

## 2023-01-20 ENCOUNTER — Encounter (HOSPITAL_COMMUNITY): Payer: Self-pay

## 2023-01-20 DIAGNOSIS — R109 Unspecified abdominal pain: Secondary | ICD-10-CM | POA: Diagnosis not present

## 2023-01-20 DIAGNOSIS — Z85118 Personal history of other malignant neoplasm of bronchus and lung: Secondary | ICD-10-CM | POA: Diagnosis not present

## 2023-01-20 DIAGNOSIS — I110 Hypertensive heart disease with heart failure: Secondary | ICD-10-CM | POA: Insufficient documentation

## 2023-01-20 DIAGNOSIS — K5730 Diverticulosis of large intestine without perforation or abscess without bleeding: Secondary | ICD-10-CM | POA: Diagnosis not present

## 2023-01-20 DIAGNOSIS — R1115 Cyclical vomiting syndrome unrelated to migraine: Secondary | ICD-10-CM | POA: Diagnosis present

## 2023-01-20 DIAGNOSIS — I10 Essential (primary) hypertension: Secondary | ICD-10-CM

## 2023-01-20 DIAGNOSIS — I5022 Chronic systolic (congestive) heart failure: Secondary | ICD-10-CM | POA: Insufficient documentation

## 2023-01-20 DIAGNOSIS — C3492 Malignant neoplasm of unspecified part of left bronchus or lung: Secondary | ICD-10-CM

## 2023-01-20 DIAGNOSIS — I2510 Atherosclerotic heart disease of native coronary artery without angina pectoris: Secondary | ICD-10-CM | POA: Diagnosis not present

## 2023-01-20 DIAGNOSIS — C3412 Malignant neoplasm of upper lobe, left bronchus or lung: Secondary | ICD-10-CM | POA: Diagnosis present

## 2023-01-20 DIAGNOSIS — E119 Type 2 diabetes mellitus without complications: Secondary | ICD-10-CM | POA: Insufficient documentation

## 2023-01-20 DIAGNOSIS — Z79899 Other long term (current) drug therapy: Secondary | ICD-10-CM | POA: Diagnosis not present

## 2023-01-20 DIAGNOSIS — F17210 Nicotine dependence, cigarettes, uncomplicated: Secondary | ICD-10-CM | POA: Diagnosis not present

## 2023-01-20 DIAGNOSIS — R1110 Vomiting, unspecified: Principal | ICD-10-CM

## 2023-01-20 DIAGNOSIS — Z7982 Long term (current) use of aspirin: Secondary | ICD-10-CM | POA: Insufficient documentation

## 2023-01-20 DIAGNOSIS — Z720 Tobacco use: Secondary | ICD-10-CM | POA: Diagnosis present

## 2023-01-20 DIAGNOSIS — N400 Enlarged prostate without lower urinary tract symptoms: Secondary | ICD-10-CM | POA: Diagnosis not present

## 2023-01-20 DIAGNOSIS — R1030 Lower abdominal pain, unspecified: Secondary | ICD-10-CM | POA: Diagnosis not present

## 2023-01-20 HISTORY — DX: Malignant neoplasm of unspecified part of left bronchus or lung: C34.92

## 2023-01-20 LAB — COMPREHENSIVE METABOLIC PANEL
ALT: 31 U/L (ref 0–44)
AST: 20 U/L (ref 15–41)
Albumin: 3.8 g/dL (ref 3.5–5.0)
Alkaline Phosphatase: 108 U/L (ref 38–126)
Anion gap: 11 (ref 5–15)
BUN: 14 mg/dL (ref 8–23)
CO2: 27 mmol/L (ref 22–32)
Calcium: 9.4 mg/dL (ref 8.9–10.3)
Chloride: 98 mmol/L (ref 98–111)
Creatinine, Ser: 0.58 mg/dL — ABNORMAL LOW (ref 0.61–1.24)
GFR, Estimated: >60 mL/min (ref >60)
Glucose, Bld: 119 mg/dL — ABNORMAL HIGH (ref 70–99)
Potassium: 3.5 mmol/L (ref 3.5–5.1)
Sodium: 136 mmol/L (ref 135–145)
Total Bilirubin: 0.6 mg/dL (ref 0.3–1.2)
Total Protein: 7.3 g/dL (ref 6.5–8.1)

## 2023-01-20 LAB — LIPASE, BLOOD: Lipase: 23 U/L (ref 11–51)

## 2023-01-20 LAB — URINALYSIS, ROUTINE W REFLEX MICROSCOPIC
Bacteria, UA: NONE SEEN
Bilirubin Urine: NEGATIVE
Glucose, UA: >=500 mg/dL — AB
Hgb urine dipstick: NEGATIVE
Ketones, ur: NEGATIVE mg/dL
Leukocytes,Ua: NEGATIVE
Nitrite: NEGATIVE
Protein, ur: 30 mg/dL — AB
Specific Gravity, Urine: 1.028 (ref 1.005–1.030)
pH: 7 (ref 5.0–8.0)

## 2023-01-20 LAB — CBC WITH DIFFERENTIAL/PLATELET
Abs Immature Granulocytes: 0 10*3/uL (ref 0.00–0.07)
Band Neutrophils: 3 %
Basophils Absolute: 0 10*3/uL (ref 0.0–0.1)
Basophils Relative: 0 %
Eosinophils Absolute: 0 10*3/uL (ref 0.0–0.5)
Eosinophils Relative: 0 %
HCT: 35.6 % — ABNORMAL LOW (ref 39.0–52.0)
Hemoglobin: 11.5 g/dL — ABNORMAL LOW (ref 13.0–17.0)
Lymphocytes Relative: 12 %
Lymphs Abs: 1.7 10*3/uL (ref 0.7–4.0)
MCH: 28 pg (ref 26.0–34.0)
MCHC: 32.3 g/dL (ref 30.0–36.0)
MCV: 86.8 fL (ref 80.0–100.0)
Monocytes Absolute: 2.1 10*3/uL — ABNORMAL HIGH (ref 0.1–1.0)
Monocytes Relative: 15 %
Neutro Abs: 10.1 10*3/uL — ABNORMAL HIGH (ref 1.7–7.7)
Neutrophils Relative %: 70 %
Platelets: 191 10*3/uL (ref 150–400)
RBC: 4.1 MIL/uL — ABNORMAL LOW (ref 4.22–5.81)
RDW: 15.9 % — ABNORMAL HIGH (ref 11.5–15.5)
WBC: 13.9 10*3/uL — ABNORMAL HIGH (ref 4.0–10.5)
nRBC: 0 % (ref 0.0–0.2)

## 2023-01-20 LAB — GLUCOSE, CAPILLARY: Glucose-Capillary: 95 mg/dL (ref 70–99)

## 2023-01-20 MED ORDER — OLANZAPINE 5 MG PO TABS
5.0000 mg | ORAL_TABLET | Freq: Every day | ORAL | Status: DC
  Administered 2023-01-20: 5 mg via ORAL
  Filled 2023-01-20: qty 1

## 2023-01-20 MED ORDER — CHLORHEXIDINE GLUCONATE CLOTH 2 % EX PADS
6.0000 | MEDICATED_PAD | Freq: Every day | CUTANEOUS | Status: DC
  Administered 2023-01-21: 6 via TOPICAL

## 2023-01-20 MED ORDER — ACETAMINOPHEN 650 MG RE SUPP: 650.0000 mg | Freq: Four times a day (QID) | RECTAL | Status: DC | PRN

## 2023-01-20 MED ORDER — LACTATED RINGERS IV BOLUS
1000.0000 mL | Freq: Once | INTRAVENOUS | Status: AC
  Administered 2023-01-20: 1000 mL via INTRAVENOUS

## 2023-01-20 MED ORDER — LABETALOL HCL 5 MG/ML IV SOLN: 10.0000 mg | INTRAVENOUS | Status: DC | PRN

## 2023-01-20 MED ORDER — METOPROLOL SUCCINATE ER 25 MG PO TB24
25.0000 mg | ORAL_TABLET | Freq: Every day | ORAL | Status: DC
  Administered 2023-01-20 – 2023-01-21 (×2): 25 mg via ORAL
  Filled 2023-01-20 (×2): qty 1

## 2023-01-20 MED ORDER — ACETAMINOPHEN 500 MG PO TABS: 500.0000 mg | ORAL_TABLET | Freq: Four times a day (QID) | ORAL | Status: DC | PRN

## 2023-01-20 MED ORDER — FENTANYL CITRATE PF 50 MCG/ML IJ SOSY
50.0000 ug | PREFILLED_SYRINGE | Freq: Once | INTRAMUSCULAR | Status: AC
  Administered 2023-01-20: 50 ug via INTRAVENOUS
  Filled 2023-01-20: qty 1

## 2023-01-20 MED ORDER — INSULIN ASPART 100 UNIT/ML IJ SOLN: 0.0000 [IU] | Freq: Every day | INTRAMUSCULAR | Status: DC

## 2023-01-20 MED ORDER — IOHEXOL 300 MG/ML  SOLN
100.0000 mL | Freq: Once | INTRAMUSCULAR | Status: AC | PRN
  Administered 2023-01-20: 100 mL via INTRAVENOUS

## 2023-01-20 MED ORDER — ONDANSETRON HCL 4 MG/2ML IJ SOLN
INTRAMUSCULAR | Status: AC
  Administered 2023-01-20: 4 mg via INTRAVENOUS
  Filled 2023-01-20: qty 2

## 2023-01-20 MED ORDER — ACETAMINOPHEN 325 MG PO TABS: 650.0000 mg | ORAL_TABLET | Freq: Four times a day (QID) | ORAL | Status: DC | PRN

## 2023-01-20 MED ORDER — SODIUM CHLORIDE 0.9% FLUSH: 3.0000 mL | INTRAVENOUS | Status: DC | PRN

## 2023-01-20 MED ORDER — SODIUM CHLORIDE 0.9 % IV SOLN: INTRAVENOUS | Status: DC

## 2023-01-20 MED ORDER — HEPARIN SODIUM (PORCINE) 5000 UNIT/ML IJ SOLN
5000.0000 [IU] | Freq: Three times a day (TID) | INTRAMUSCULAR | Status: DC
  Administered 2023-01-20 – 2023-01-21 (×2): 5000 [IU] via SUBCUTANEOUS
  Filled 2023-01-20 (×2): qty 1

## 2023-01-20 MED ORDER — ONDANSETRON HCL 4 MG PO TABS: 4.0000 mg | ORAL_TABLET | Freq: Four times a day (QID) | ORAL | Status: DC | PRN

## 2023-01-20 MED ORDER — SODIUM CHLORIDE 0.9% FLUSH
3.0000 mL | Freq: Two times a day (BID) | INTRAVENOUS | Status: DC
  Administered 2023-01-20 – 2023-01-21 (×3): 3 mL via INTRAVENOUS

## 2023-01-20 MED ORDER — LORAZEPAM 2 MG/ML IJ SOLN
1.0000 mg | Freq: Once | INTRAMUSCULAR | Status: AC
  Administered 2023-01-20: 1 mg via INTRAVENOUS
  Filled 2023-01-20: qty 1

## 2023-01-20 MED ORDER — ONDANSETRON HCL 4 MG/2ML IJ SOLN
INTRAMUSCULAR | Status: AC
  Administered 2023-01-20: 4 mg
  Filled 2023-01-20: qty 2

## 2023-01-20 MED ORDER — FAMOTIDINE IN NACL 20-0.9 MG/50ML-% IV SOLN
20.0000 mg | Freq: Once | INTRAVENOUS | Status: AC
  Administered 2023-01-20: 20 mg via INTRAVENOUS
  Filled 2023-01-20: qty 50

## 2023-01-20 MED ORDER — ALBUTEROL SULFATE (2.5 MG/3ML) 0.083% IN NEBU: 2.5000 mg | INHALATION_SOLUTION | RESPIRATORY_TRACT | Status: DC | PRN

## 2023-01-20 MED ORDER — INSULIN ASPART 100 UNIT/ML IJ SOLN: 0.0000 [IU] | Freq: Three times a day (TID) | INTRAMUSCULAR | Status: DC

## 2023-01-20 MED ORDER — SODIUM CHLORIDE 0.9 % IV SOLN: INTRAVENOUS | Status: DC | PRN

## 2023-01-20 MED ORDER — BISACODYL 10 MG RE SUPP: 10.0000 mg | Freq: Every day | RECTAL | Status: DC | PRN

## 2023-01-20 MED ORDER — ATORVASTATIN CALCIUM 40 MG PO TABS
80.0000 mg | ORAL_TABLET | Freq: Every day | ORAL | Status: DC
  Administered 2023-01-20: 80 mg via ORAL
  Filled 2023-01-20: qty 2

## 2023-01-20 MED ORDER — ASPIRIN 81 MG PO TBEC
81.0000 mg | DELAYED_RELEASE_TABLET | Freq: Every day | ORAL | Status: DC
  Administered 2023-01-21: 81 mg via ORAL
  Filled 2023-01-20: qty 1

## 2023-01-20 MED ORDER — PROCHLORPERAZINE 25 MG RE SUPP
25.0000 mg | Freq: Once | RECTAL | Status: AC
  Administered 2023-01-20: 25 mg via RECTAL
  Filled 2023-01-20: qty 1

## 2023-01-20 MED ORDER — POLYETHYLENE GLYCOL 3350 17 G PO PACK: 17.0000 g | PACK | Freq: Every day | ORAL | Status: DC | PRN

## 2023-01-20 MED ORDER — ONDANSETRON HCL 4 MG/2ML IJ SOLN
4.0000 mg | Freq: Four times a day (QID) | INTRAMUSCULAR | Status: DC | PRN
  Administered 2023-01-21: 4 mg via INTRAVENOUS
  Filled 2023-01-20: qty 2

## 2023-01-20 MED ORDER — LORAZEPAM 2 MG/ML IJ SOLN
0.5000 mg | Freq: Once | INTRAMUSCULAR | Status: AC
  Administered 2023-01-20: 0.5 mg via INTRAVENOUS
  Filled 2023-01-20: qty 1

## 2023-01-20 MED ORDER — ONDANSETRON HCL 4 MG/2ML IJ SOLN: 4.0000 mg | Freq: Four times a day (QID) | INTRAMUSCULAR | Status: DC | PRN

## 2023-01-20 MED ORDER — HYDROCODONE-ACETAMINOPHEN 7.5-325 MG PO TABS
1.0000 | ORAL_TABLET | Freq: Four times a day (QID) | ORAL | Status: DC | PRN
  Administered 2023-01-20: 1 via ORAL
  Filled 2023-01-20: qty 1

## 2023-01-20 MED ORDER — EZETIMIBE 10 MG PO TABS
10.0000 mg | ORAL_TABLET | Freq: Every day | ORAL | Status: DC
  Administered 2023-01-20 – 2023-01-21 (×2): 10 mg via ORAL
  Filled 2023-01-20 (×2): qty 1

## 2023-01-20 MED ORDER — ONDANSETRON HCL 4 MG/2ML IJ SOLN
4.0000 mg | Freq: Once | INTRAMUSCULAR | Status: AC
  Filled 2023-01-20: qty 2

## 2023-01-20 MED ORDER — DROPERIDOL 2.5 MG/ML IJ SOLN
1.2500 mg | Freq: Once | INTRAMUSCULAR | Status: AC
  Administered 2023-01-20: 1.25 mg via INTRAVENOUS
  Filled 2023-01-20: qty 2

## 2023-01-20 NOTE — ED Provider Notes (Signed)
[facility] MEDICAL SURGICAL UNIT Provider Note  CSN: [id number] Arrival date & time: 01/20/23 8546  Chief Complaint(s) Abdominal Pain  HPI Rodney Mahoney is a 62 y.o. male with PMH CAD, HTN, squamous cell lung cancer on active chemotherapy with recent  pegfilgrastim administration on 01/15/2023 who presents emergency department for evaluation of abdominal pain nausea and vomiting.  States that symptoms began abruptly last night and has been unable to tolerate p.o.  Endorsing epigastric abdominal pain but denies chest pain, shortness of breath, headache, fever or other systemic symptoms.  Patient actively vomiting here in the emergency room.   Past Medical History Past Medical History:  Diagnosis Date   Bronchitis    CAD (coronary artery disease)    a. cath 11/26/2014 EF 45%, moderate to severe inferior hypokinesis, 90% small D1, 100% mid to distal RCA with minimal collateral, 40% mid to distal LCx. 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   Primary cancer of left upper lobe of lung (HCC) 12/01/2022   Bilateral hand pain 08/03/2022   Positive screening for depression on 9-item Patient Health Questionnaire (PHQ-9) 08/03/2022   Neck pain with history of cervical spinal surgery 06/02/2022   Prediabetes 02/24/2022   Muscle ache 02/24/2022   Erectile dysfunction 11/28/2021   Need for varicella vaccine 11/28/2021   Annual physical exam 07/31/2021   Immunization due 07/31/2021   Arthritis 07/31/2021    Coronary artery disease involving native coronary artery of native heart without angina pectoris 06/02/2021   Screening for colon cancer 05/31/2021   Need for immunization against influenza 05/31/2021   Obesity (BMI 30-39.9) 05/31/2021   Tobacco use 02/02/2018   Chronic systolic CHF (congestive heart failure) (HCC) 02/02/2018   Non-STEMI (non-ST elevated myocardial infarction) (HCC) 01/31/2018   Diverticulitis 12/07/2014   Diverticulitis of large intestine with perforation with bleeding    CAD (coronary artery disease), native coronary artery 12/05/2014   Essential hypertension 11/28/2014   Hyperlipidemia    MR (mitral regurgitation)    NSTEMI (non-ST elevated myocardial infarction) (HCC) 11/26/2014   Home Medication(s) Prior to Admission medications   Medication Sig Start Date End Date Taking? Authorizing Provider  acetaminophen (TYLENOL) 500 MG tablet Take 500 mg by mouth every 6 (six) hours as needed for mild pain.   Yes [provider]  albuterol (VENTOLIN HFA) 108 (90 Base) MCG/ACT inhaler Inhale 2 puffs into the lungs every 6 (six) hours as needed for wheezing or shortness of breath. 12/02/22  Yes Almon Hercules, MD  aspirin EC 81 MG tablet Take 1 tablet (81 mg total) by mouth daily. Swallow whole. 06/27/21  Yes Furth, Cadence H, PA-C  atorvastatin (LIPITOR) 80 MG tablet Take 1 tablet (80 mg total) by mouth daily. TAKE 1 TABLET BY MOUTH EVERY DAY AT 6 PM Patient taking differently: Take 80 mg by mouth at bedtime. 08/25/22 02/21/23 Yes Billie Lade, MD  empagliflozin (JARDIANCE) 10 MG TABS tablet Take 1 tablet (10 mg total) by mouth daily before breakfast.  01/15/23  Yes Billie Lade, MD  ezetimibe (ZETIA) 10 MG tablet Take 1 tablet by mouth once daily 01/15/23  Yes Billie Lade, MD  HYDROcodone-acetaminophen (NORCO) 7.5-325 MG tablet Take 1 tablet by mouth every 6 (six) hours as needed for moderate pain. 12/22/22  Yes [name], MD  lidocaine-prilocaine (EMLA) cream  Apply to affected area once 01/13/23  Yes [name], MD  lisinopril (ZESTRIL) 10 MG tablet Take 1 tablet by mouth once daily 12/02/22  Yes [name], MD  Menthol, Topical Analgesic, (BIOFREEZE EX) Apply 1 Application topically daily as needed (pain).   Yes [provider]  nitroGLYCERIN (NITROSTAT) 0.4 MG SL tablet Place 1 tablet (0.4 mg total) under the tongue every 5 (five) minutes as needed. 02/02/18  Yes Arty Baumgartner, NP  prochlorperazine (COMPAZINE) 10 MG tablet Take 1 tablet (10 mg total) by mouth every 6 (six) hours as needed for nausea or vomiting. 01/13/23  Yes [name], MD  aluminum-magnesium hydroxide 200-200 MG/5ML suspension Take 10 mLs by mouth every 6 (six) hours as needed for indigestion. Patient not taking: Reported on 01/20/2023 12/31/22   [name], MD  CARBOPLATIN IV Inject into the vein once a week. 12/25/22   [provider]  dapagliflozin propanediol (FARXIGA) 10 MG TABS tablet Take 1 tablet (10 mg total) by mouth daily before breakfast. Patient not taking: Reported on 01/20/2023 12/18/22   [name], MD  metoprolol succinate (TOPROL-XL) 25 MG 24 hr tablet Take 1 tablet (25 mg total) by mouth daily. Patient not taking: Reported on 01/20/2023 01/15/23   Billie Lade, MD  PACLITAXEL IV Inject into the vein once a week. 12/25/22   [provider]  sildenafil (VIAGRA) 50 MG tablet Take 50 mg by mouth daily as needed. Patient not taking: Reported on 01/20/2023 12/02/22   [provider]                                                                                                                                    Past Surgical History Past Surgical History:  Procedure Laterality Date   BRONCHIAL BIOPSY  12/01/2022   Procedure: BRONCHIAL BIOPSIES;  Surgeon: Martina Sinner, MD;  Location: [facility] ENDOSCOPY;  Service: Pulmonary;;   BRONCHIAL BRUSHINGS  12/01/2022   Procedure: BRONCHIAL BRUSHINGS;   Surgeon: Martina Sinner, MD;  Location: [facility] ENDOSCOPY;  Service: Pulmonary;;   BRONCHIAL WASHINGS  12/01/2022   Procedure: BRONCHIAL WASHINGS;  Surgeon: Martina Sinner, MD;  Location: [facility] ENDOSCOPY;  Service: Pulmonary;;   CARDIAC CATHETERIZATION N/A 11/26/2014   Procedure: Left Heart Cath and Coronary Angiography;  Surgeon: Peter M Swaziland, MD;  Location: [facility] INVASIVE CV LAB;  Service: Cardiovascular;  Laterality: N/A;   COLONOSCOPY     per patient: done in [place], age 50, couple of polyps.   COLONOSCOPY WITH PROPOFOL N/A 10/08/2022   Procedure: COLONOSCOPY WITH PROPOFOL;  Surgeon: Marletta Lor,  Hennie Duos, DO;  Location: [facility] ENDO SUITE;  Service: Endoscopy;  Laterality: N/A;  11:00 am   CORONARY STENT INTERVENTION N/A 01/31/2018   Procedure: CORONARY STENT INTERVENTION;  Surgeon: Runell Gess, MD;  Location: [facility] INVASIVE CV LAB;  Service: Cardiovascular;  Laterality: N/A;   HEMOSTASIS CONTROL  12/01/2022   Procedure: HEMOSTASIS CONTROL;  Surgeon: Martina Sinner, MD;  Location: [facility] ENDOSCOPY;  Service: Pulmonary;;  cold saline   IR IMAGING GUIDED PORT INSERTION  12/23/2022   LEFT HEART CATH AND CORONARY ANGIOGRAPHY N/A 01/31/2018   Procedure: LEFT HEART CATH AND CORONARY ANGIOGRAPHY;  Surgeon: Runell Gess, MD;  Location: [facility] INVASIVE CV LAB;  Service: Cardiovascular;  Laterality: N/A;   NECK SURGERY  2005   VIDEO BRONCHOSCOPY N/A 12/01/2022   Procedure: VIDEO BRONCHOSCOPY WITH FLUORO;  Surgeon: Martina Sinner, MD;  Location[facility] ENDOSCOPY;  Service: Pulmonary;  Laterality: N/A;   Family History Family History  Problem Relation Age of Onset   Diabetes Mother    Heart attack Father    Cancer Sister    Hypertension Brother    Colon cancer Neg Hx    Prostate cancer Neg Hx    Lung cancer Neg Hx     Social History Social History   Tobacco Use   Smoking status: Every Day    Current packs/day: 1.00    Average packs/day: 1 pack/day for 42.0 years (42.0 ttl pk-yrs)    Types: Cigarettes    Smokeless tobacco: Never   Tobacco comments:    He used to quit and start back. He has not quit for the past 5 years.   Vaping Use   Vaping status: Never Used  Substance Use Topics   Alcohol use: Yes    Comment: 10 cans of beer   Drug use: Yes    Types: Marijuana    Comment: smokes week here and there   Allergies Patient has no known allergies.  Review of Systems Review of Systems  Gastrointestinal:  Positive for abdominal pain, nausea and vomiting.    Physical Exam Vital Signs  I have reviewed the triage vital signs BP (!) 161/88 (BP Location: Right Arm)   Pulse 92   Temp 98.7 F (37.1 C) (Oral)   Resp 16   Ht 5\' 6"  (1.676 m)   Wt 78.7 kg   SpO2 95%   BMI 27.99 kg/m   Physical Exam Constitutional:      General: He is not in acute distress.    Appearance: Normal appearance. He is ill-appearing.  HENT:     Head: Normocephalic and atraumatic.     Nose: No congestion or rhinorrhea.  Eyes:     General:        Right eye: No discharge.        Left eye: No discharge.     Extraocular Movements: Extraocular movements intact.     Pupils: Pupils are equal, round, and reactive to light.  Cardiovascular:     Rate and Rhythm: Normal rate and regular rhythm.     Heart sounds: No murmur heard. Pulmonary:     Effort: No respiratory distress.     Breath sounds: No wheezing or rales.  Abdominal:     General: There is no distension.     Tenderness: There is abdominal tenderness in the epigastric area.  Musculoskeletal:        General: Normal range of motion.     Cervical back: Normal range of motion.  Skin:  General: Skin is warm and dry.  Neurological:     General: No focal deficit present.     Mental Status: He is alert.     ED Results and Treatments Labs (all labs ordered are listed, but only abnormal results are displayed) Labs Reviewed  CBC WITH DIFFERENTIAL/PLATELET - Abnormal; Notable for the following components:      Result Value   WBC 13.9 (*)     RBC 4.10 (*)    Hemoglobin 11.5 (*)    HCT 35.6 (*)    RDW 15.9 (*)    Neutro Abs 10.1 (*)    Monocytes Absolute 2.1 (*)    All other components within normal limits  COMPREHENSIVE METABOLIC PANEL - Abnormal; Notable for the following components:   Glucose, Bld 119 (*)    Creatinine, Ser 0.58 (*)    All other components within normal limits  URINALYSIS, ROUTINE W REFLEX MICROSCOPIC - Abnormal; Notable for the following components:   Glucose, UA >=500 (*)    Protein, ur 30 (*)    All other components within normal limits  LIPASE, BLOOD  BASIC METABOLIC PANEL  CBC                                                                                                                          Radiology CT ABDOMEN PELVIS W CONTRAST  Result Date: 01/20/2023 CLINICAL DATA:  Three day history of nausea, vomiting, and diarrhea associated with 1 day of lower abdominal pain EXAM: CT ABDOMEN AND PELVIS WITH CONTRAST TECHNIQUE: Multidetector CT imaging of the abdomen and pelvis was performed using the standard protocol following bolus administration of intravenous contrast. RADIATION DOSE REDUCTION: This exam was performed according to the departmental dose-optimization program which includes automated exposure control, adjustment of the mA and/or kV according to patient size and/or use of iterative reconstruction technique. CONTRAST:  OMNIPAQUE IOHEXOL 300 MG/ML  SOLN COMPARISON:  CT abdomen and pelvis dated 12/07/2014 FINDINGS: Lower chest: Partially imaged central venous catheter terminates at the superior cavoatrial junction. No focal consolidation or pulmonary nodule in the lung bases. No pleural effusion or pneumothorax demonstrated. Partially imaged heart size is normal. Coronary artery stents. Coronary artery calcifications. Hepatobiliary: No focal hepatic lesions. No intra or extrahepatic biliary ductal dilation. Normal gallbladder. Pancreas: No focal lesions or main ductal dilation. Spleen: Normal  in size without focal abnormality. Adrenals/Urinary Tract: No adrenal nodules. No suspicious renal mass, calculi or hydronephrosis. No focal bladder wall thickening. Stomach/Bowel: Normal appearance of the stomach. No evidence of bowel wall thickening, distention, or inflammatory changes. Colonic diverticulosis without acute diverticulitis. Normal appendix. Vascular/Lymphatic: Aortic atherosclerosis. No enlarged abdominal or pelvic lymph nodes. Reproductive: Enlargement of the prostate with median lobe hypertrophy. Similar asymmetrically more prominent left seminal vesicle. Other: No free fluid, fluid collection, or free air. Musculoskeletal: No acute or abnormal lytic or blastic osseous lesions. Degenerative changes of the hips. IMPRESSION: 1. No acute abnormality in the abdomen or pelvis. 2. Aortic Atherosclerosis (  ICD10-I70.0). Coronary artery calcifications. Assessment for potential risk factor modification, dietary therapy or pharmacologic therapy may be warranted, if clinically indicated. Electronically Signed   By: Agustin Cree M.D.   On: 01/20/2023 12:03    Pertinent labs & imaging results that were available during my care of the patient were reviewed by me and considered in my medical decision making (see MDM for details).  Medications Ordered in ED Medications  0.9 %  sodium chloride infusion ( Intravenous New Bag/Given 01/20/23 1453)  labetalol (NORMODYNE) injection 10 mg (has no administration in time range)  aspirin EC tablet 81 mg (81 mg Oral Not Given 01/20/23 1801)  HYDROcodone-acetaminophen (NORCO) 7.5-325 MG per tablet 1 tablet (1 tablet Oral Given 01/20/23 1949)  atorvastatin (LIPITOR) tablet 80 mg (has no administration in time range)  ezetimibe (ZETIA) tablet 10 mg (10 mg Oral Given 01/20/23 1800)  metoprolol succinate (TOPROL-XL) 24 hr tablet 25 mg (25 mg Oral Given 01/20/23 1800)  albuterol (PROVENTIL) (2.5 MG/3ML) 0.083% nebulizer solution 2.5 mg (has no administration in time range)   sodium chloride flush (NS) 0.9 % injection 3 mL (3 mLs Intravenous Given 01/20/23 1454)  sodium chloride flush (NS) 0.9 % injection 3 mL (3 mLs Intravenous Given 01/20/23 1454)  sodium chloride flush (NS) 0.9 % injection 3 mL (has no administration in time range)  0.9 %  sodium chloride infusion (has no administration in time range)  acetaminophen (TYLENOL) tablet 650 mg (has no administration in time range)    Or  acetaminophen (TYLENOL) suppository 650 mg (has no administration in time range)  polyethylene glycol (MIRALAX / GLYCOLAX) packet 17 g (has no administration in time range)  bisacodyl (DULCOLAX) suppository 10 mg (has no administration in time range)  ondansetron (ZOFRAN) tablet 4 mg (has no administration in time range)    Or  ondansetron (ZOFRAN) injection 4 mg (has no administration in time range)  heparin injection 5,000 Units (has no administration in time range)  OLANZapine (ZYPREXA) tablet 5 mg (5 mg Oral Given 01/20/23 1949)  LORazepam (ATIVAN) injection 1 mg (has no administration in time range)  insulin aspart (novoLOG) injection 0-6 Units (has no administration in time range)  insulin aspart (novoLOG) injection 0-5 Units (has no administration in time range)  famotidine (PEPCID) IVPB 20 mg premix (0 mg Intravenous Stopped 01/20/23 0920)  lactated ringers bolus 1,000 mL (0 mLs Intravenous Stopped 01/20/23 0920)  ondansetron (ZOFRAN) injection 4 mg (4 mg Intravenous Given 01/20/23 0754)  fentaNYL (SUBLIMAZE) injection 50 mcg (50 mcg Intravenous Given 01/20/23 0754)  droperidol (INAPSINE) 2.5 MG/ML injection 1.25 mg (1.25 mg Intravenous Given 01/20/23 0943)  LORazepam (ATIVAN) injection 0.5 mg (0.5 mg Intravenous Given 01/20/23 0942)  iohexol (OMNIPAQUE) 300 MG/ML solution 100 mL (100 mLs Intravenous Contrast Given 01/20/23 1100)  lactated ringers bolus 1,000 mL (0 mLs Intravenous Stopped 01/20/23 1323)  prochlorperazine (COMPAZINE) suppository 25 mg (25 mg Rectal Given 01/20/23 1656)   ondansetron (ZOFRAN) 4 MG/2ML injection (4 mg  Given 01/20/23 1449)  Procedures Procedures  (including critical care time)  Medical Decision Making / ED Course   This patient presents to the ED for concern of abdominal pain, nausea, vomiting, this involves an extensive number of treatment options, and is a complaint that carries with it a high risk of complications and morbidity.  The differential diagnosis includes chemotherapy side effects, intra-abdominal infection, obstruction, gastroparesis, dehydration, gastritis, pancreatitis, cholecystitis  MDM: Patient seen emergency room for evaluation of abdominal pain nausea and vomiting.  Physical exam with epigastric and right upper quadrant tenderness to palpation as well as a regular tachycardia.  Laboratory evaluation with a leukocytosis of 13.9, hemoglobin 11.5 but is otherwise unremarkable.  CT abdomen pelvis with no acute pathology.  Patient given Zofran, fentanyl and lactated Ringer's as well as Pepcid but on reevaluation symptoms are persistent.  He was then given droperidol and Ativan and on second reevaluation patient still having persistent nausea and vomiting.  Patient require hospital admission for persistent nausea and vomiting and inability to tolerate p.o.  Medications likely secondary to chemo regimen.  Patient admitted   Additional history obtained:  -External records from outside source obtained and reviewed including: Chart review including previous notes, labs, imaging, consultation notes   Lab Tests: -I ordered, reviewed, and interpreted labs.   The pertinent results include:   Labs Reviewed  CBC WITH DIFFERENTIAL/PLATELET - Abnormal; Notable for the following components:      Result Value   WBC 13.9 (*)    RBC 4.10 (*)    Hemoglobin 11.5 (*)    HCT 35.6 (*)    RDW 15.9 (*)    Neutro  Abs 10.1 (*)    Monocytes Absolute 2.1 (*)    All other components within normal limits  COMPREHENSIVE METABOLIC PANEL - Abnormal; Notable for the following components:   Glucose, Bld 119 (*)    Creatinine, Ser 0.58 (*)    All other components within normal limits  URINALYSIS, ROUTINE W REFLEX MICROSCOPIC - Abnormal; Notable for the following components:   Glucose, UA >=500 (*)    Protein, ur 30 (*)    All other components within normal limits  LIPASE, BLOOD  BASIC METABOLIC PANEL  CBC       Imaging Studies ordered: I ordered imaging studies including CT abdomen pelvis I independently visualized and interpreted imaging. I agree with the radiologist interpretation   Medicines ordered and prescription drug management: Meds ordered this encounter  Medications   famotidine (PEPCID) IVPB 20 mg premix   lactated ringers bolus 1,000 mL   ondansetron (ZOFRAN) injection 4 mg   fentaNYL (SUBLIMAZE) injection 50 mcg   ondansetron (ZOFRAN) 4 MG/2ML injection    Ether Griffins, Deanna N: cabinet override   droperidol (INAPSINE) 2.5 MG/ML injection 1.25 mg   LORazepam (ATIVAN) injection 0.5 mg   iohexol (OMNIPAQUE) 300 MG/ML solution 100 mL   lactated ringers bolus 1,000 mL   DISCONTD: ondansetron (ZOFRAN) injection 4 mg   0.9 %  sodium chloride infusion   labetalol (NORMODYNE) injection 10 mg   DISCONTD: acetaminophen (TYLENOL) tablet 500 mg   aspirin EC tablet 81 mg    Swallow whole.     HYDROcodone-acetaminophen (NORCO) 7.5-325 MG per tablet 1 tablet   atorvastatin (LIPITOR) tablet 80 mg    TAKE 1 TABLET BY MOUTH EVERY DAY AT 6 PM     ezetimibe (ZETIA) tablet 10 mg   metoprolol succinate (TOPROL-XL) 24 hr tablet 25 mg   prochlorperazine (COMPAZINE) suppository 25 mg   albuterol (PROVENTIL) (  2.5 MG/3ML) 0.083% nebulizer solution 2.5 mg   sodium chloride flush (NS) 0.9 % injection 3 mL   sodium chloride flush (NS) 0.9 % injection 3 mL   sodium chloride flush (NS) 0.9 % injection 3 mL    0.9 %  sodium chloride infusion   OR Linked Order Group    acetaminophen (TYLENOL) tablet 650 mg    acetaminophen (TYLENOL) suppository 650 mg   polyethylene glycol (MIRALAX / GLYCOLAX) packet 17 g   bisacodyl (DULCOLAX) suppository 10 mg   OR Linked Order Group    ondansetron (ZOFRAN) tablet 4 mg    ondansetron (ZOFRAN) injection 4 mg   heparin injection 5,000 Units   ondansetron (ZOFRAN) 4 MG/2ML injection    Donavan Foil, [place] T: cabinet override   OLANZapine (ZYPREXA) tablet 5 mg   LORazepam (ATIVAN) injection 1 mg   insulin aspart (novoLOG) injection 0-6 Units    Order Specific Question:   Correction coverage:    Answer:   Very Sensitive (ESRD/Dialysis)    Order Specific Question:   CBG < 70:    Answer:   Implement Hypoglycemia Standing Orders and refer to Hypoglycemia Standing Orders sidebar report    Order Specific Question:   CBG 70 - 120:    Answer:   0 units    Order Specific Question:   CBG 121 - 150:    Answer:   0 units    Order Specific Question:   CBG 151 - 200:    Answer:   1 unit    Order Specific Question:   CBG 201-250:    Answer:   2 units    Order Specific Question:   CBG 251-300:    Answer:   3 units    Order Specific Question:   CBG 301-350:    Answer:   4 units    Order Specific Question:   CBG 351-400:    Answer:   5 units    Order Specific Question:   CBG > 400    Answer:   Give 6 units and call MD   insulin aspart (novoLOG) injection 0-5 Units    Order Specific Question:   Correction coverage:    Answer:   HS scale    Order Specific Question:   CBG < 70:    Answer:   Implement Hypoglycemia Standing Orders and refer to Hypoglycemia Standing Orders sidebar report    Order Specific Question:   CBG 70 - 120:    Answer:   0 units    Order Specific Question:   CBG 121 - 150:    Answer:   0 units    Order Specific Question:   CBG 151 - 200:    Answer:   0 units    Order Specific Question:   CBG 201 - 250:    Answer:   2 units    Order Specific  Question:   CBG 251 - 300:    Answer:   3 units    Order Specific Question:   CBG 301 - 350:    Answer:   4 units    Order Specific Question:   CBG 351 - 400:    Answer:   5 units    Order Specific Question:   CBG > 400    Answer:   call MD and obtain STAT lab verification    -I have reviewed the patients home medicines and have made adjustments as needed  Critical interventions none    Cardiac  Monitoring: The patient was maintained on a cardiac monitor.  I personally viewed and interpreted the cardiac monitored which showed an underlying rhythm of: NSR, sinus tachycardia  Social Determinants of Health:  Factors impacting patients care include: none   Reevaluation: After the interventions noted above, I reevaluated the patient and found that they have :stayed the same  Co morbidities that complicate the patient evaluation  Past Medical History:  Diagnosis Date   Bronchitis    CAD (coronary artery disease)    a. cath 11/26/2014 EF 45%, moderate to severe inferior hypokinesis, 90% small D1, 100% mid to distal RCA with minimal collateral, 40% mid to distal LCx. Medical therapy as RCA occlusion appears to be completed event; DES to mid circumflex October 2019   Essential hypertension    Hyperlipidemia    Ischemic cardiomyopathy    NSTEMI (non-ST elevated myocardial infarction) (HCC)    11/26/14, 01/31/18   Squamous cell lung cancer, left (HCC)       Dispostion: I considered admission for this patient, patient require hospital mission for persistent nausea and vomiting and inability tolerate p.o.     Final Clinical Impression(s) / ED Diagnoses Final diagnoses:  Vomiting, unspecified vomiting type, unspecified whether nausea present     @PCDICTATION @    Glendora Score, MD 01/20/23 2056

## 2023-01-20 NOTE — H&P (Signed)
Patient Demographics:    Rodney Mahoney, is a 61 y.o. male  MRN: [id number]   DOB - 01-23-1961  Admit Date - 01/20/2023  Outpatient Primary MD for the patient is Billie Lade, MD   Assessment & Plan:   Assessment and Plan:  1) intractable emesis--- due to chemotherapy-- - Carboplatin, paclitaxel, durvalumab and tremelimumab cycle 1 started on 01/13/2023  -Discussed with Dr. 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  No weight loss or hemoptysis. - CT angio chest (11/30/2022): Left upper lobe mass with central necrosis and bulky mediastinal, left hilar and left lower neck adenopathy and lytic lesion in the right superior sternum. - Bronchoscopy (12/01/2022): Narrowing of LUL apical and posterior segment.  Otherwise normal-appearing bronchial trees. - LUL upper lobe biopsy (12/01/2022): Invasive moderately differentiated squamous cell carcinoma. - PET scan (12/16/2022): Left supraclavicular 3.2 cm mass, direct extension from mediastinal adenopathy.  Small right supraclavicular lymph node.  Large left lung mass 7.4 x 6.1 cm extending into the left hilum and  AP window.  Prevascular and paratracheal lymph nodes and right paratracheal node.  Extensive involvement of sternum manubrium and first rib close to manubrium.  Soft tissue extension into the medial border of the overlying pectoralis muscle.  Discrete hypermetabolic right upper lobe nodule 10 mm.  No abdominal or pelvic metastatic disease. - He has received 1 dose of concurrent chemo XRT on 12/23/2022. - Left lung XRT 12 Gray in 4 fractions and left bronchus radiation 18 Gray in 6 fractions completed - [employer] (01/07/2023): PIK3CA, T p53, MSI high not detected. - NGS (01/03/2023): TMB-high.  MSI-stable.  PIK3CA and T p53 mutations present.  No other targetable mutations.  PD-L1 TPS 0%.  HER2 IHC 0. - Carboplatin, paclitaxel, durvalumab and tremelimumab cycle 1 started on 01/13/2023  -Discussed with Dr. Ellin Saba patient's primary oncologist on 01/20/2023  3)CAD with prior angioplasty and stent placement to mid circumflex in 2019 -Chest pain-free, continue metoprolol, continue aspirin and atorvastatin  4)Reactive Leukocytosis-- he received Fulphila injection on 01/15/23 -CT abdomen and pelvis without acute infection -UA does not look infected  5) Hypertension: --Continue metoprolol -Hold lisinopril due to intractable emesis and risk for AKI   6)DM2--A1c 6.3 reflecting excellent diabetic control PTA -Hold Farxiga and Jardiance Use Novolog/Humalog Sliding scale insulin with Accu-Cheks/Fingersticks as ordered   Status  is: Inpatient  Remains inpatient appropriate because:   Dispo: The patient is from: Home              Anticipated d/c is to: Home              Anticipated d/c date is: 1 day              Patient currently is not medically stable to d/c. Barriers: Not Clinically Stable-  With History of - Reviewed by me  Past Medical History:  Diagnosis Date   Bronchitis    CAD (coronary artery disease)    a. cath 11/26/2014 EF 45%, moderate to severe inferior hypokinesis, 90% small D1,  100% mid to distal RCA with minimal collateral, 40% mid to distal LCx. Medical therapy as RCA occlusion appears to be completed event; DES to mid circumflex October 2019   Essential hypertension    Hyperlipidemia    Ischemic cardiomyopathy    NSTEMI (non-ST elevated myocardial infarction) (HCC)    11/26/14, 01/31/18   Squamous cell lung cancer, left [facility])       Past Surgical History:  Procedure Laterality Date   BRONCHIAL BIOPSY  12/01/2022   Procedure: BRONCHIAL BIOPSIES;  Surgeon: Martina Sinner, MD;  Location: [facility] ENDOSCOPY;  Service: Pulmonary;;   BRONCHIAL BRUSHINGS  12/01/2022   Procedure: BRONCHIAL BRUSHINGS;  Surgeon: Martina Sinner, MD;  Location: [facility] ENDOSCOPY;  Service: Pulmonary;;   BRONCHIAL WASHINGS  12/01/2022   Procedure: BRONCHIAL WASHINGS;  Surgeon: Martina Sinner, MD;  Location: [facility] ENDOSCOPY;  Service: Pulmonary;;   CARDIAC CATHETERIZATION N/A 11/26/2014   Procedure: Left Heart Cath and Coronary Angiography;  Surgeon: Peter M Swaziland, MD;  Location: [facility] INVASIVE CV LAB;  Service: Cardiovascular;  Laterality: N/A;   COLONOSCOPY     per patient: done in [place], age 23, couple of polyps.   COLONOSCOPY WITH PROPOFOL N/A 10/08/2022   Procedure: COLONOSCOPY WITH PROPOFOL;  Surgeon: Lanelle Bal, DO;  Location: [facility] ENDO SUITE;  Service: Endoscopy;  Laterality: N/A;  11:00 am   CORONARY STENT INTERVENTION N/A 01/31/2018   Procedure: CORONARY STENT INTERVENTION;  Surgeon: Runell Gess, MD;  Location: [facility] INVASIVE CV LAB;  Service: Cardiovascular;  Laterality: N/A;   HEMOSTASIS CONTROL  12/01/2022   Procedure: HEMOSTASIS CONTROL;  Surgeon: Martina Sinner, MD;  Location: [facility] ENDOSCOPY;  Service: Pulmonary;;  cold saline   IR IMAGING GUIDED PORT INSERTION  12/23/2022   LEFT HEART CATH AND CORONARY ANGIOGRAPHY N/A 01/31/2018   Procedure: LEFT HEART CATH AND CORONARY ANGIOGRAPHY;  Surgeon: Runell Gess, MD;  Location: [facility] INVASIVE CV LAB;  Service: Cardiovascular;   Laterality: N/A;   NECK SURGERY  2005   VIDEO BRONCHOSCOPY N/A 12/01/2022   Procedure: VIDEO BRONCHOSCOPY WITH FLUORO;  Surgeon: Martina Sinner, MD;  Location: [facility] ENDOSCOPY;  Service: Pulmonary;  Laterality: N/A;    Chief Complaint  Patient presents with   Abdominal Pain      HPI:    Rodney Mahoney  is a 62 y.o. 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, CAD with prior angioplasty and stent placement to mid circumflex in 2019, tobacco abuse, HTN, HLD who presents to the ED with persistent emesis after getting chemo on 01/13/2023, he received Fulphila injection on 01/15/23 No fever  Or chills  -Oral antiemetics have not been effective -Emesis without bile or blood -Last BM was 01/18/2023 -Denies chest pain palpitations or dizziness -In the ED failed  challenge of oral fluids after getting antiemetics and droperidol =-CT abdomen and pelvis without acute findings -Creatinine 0.58 BUN 14 glucose 119 bicarb 27 ,anion gap 11 -LFTs are not elevated and lipase is WNL -WBC 13.9 patient received  Fulphila injection on 01/15/23 - -Hgb 11.5 UA is not suggestive of UTI   Review of systems:    In addition to the HPI above,   A full Review of  Systems was done, all other systems reviewed are negative except as noted above in HPI , .    Social History:  Reviewed by me    Social History   Tobacco Use   Smoking status: Every Day    Current packs/day: 1.00    Average packs/day: 1 pack/day for 42.0 years (42.0 ttl pk-yrs)    Types: Cigarettes   Smokeless tobacco: Never   Tobacco comments:    He used to quit and start back. He has not quit for the past 5 years.   Substance Use Topics   Alcohol use: Yes    Comment: 10 cans of beer     Family History :  Reviewed by me    Family History  Problem Relation Age of Onset   Diabetes Mother    Heart attack Father    Cancer Sister    Hypertension Brother    Colon cancer Neg Hx    Prostate cancer Neg Hx     Lung cancer Neg Hx      Home Medications:   Prior to Admission medications   Medication Sig Start Date End Date Taking? Authorizing Provider  acetaminophen (TYLENOL) 500 MG tablet Take 500 mg by mouth every 6 (six) hours as needed for mild pain.   Yes [provider]  albuterol (VENTOLIN HFA) 108 (90 Base) MCG/ACT inhaler Inhale 2 puffs into the lungs every 6 (six) hours as needed for wheezing or shortness of breath. 12/02/22  Yes Almon Hercules, MD  aspirin EC 81 MG tablet Take 1 tablet (81 mg total) by mouth daily. Swallow whole. 06/27/21  Yes Furth, Cadence H, PA-C  atorvastatin (LIPITOR) 80 MG tablet Take 1 tablet (80 mg total) by mouth daily. TAKE 1 TABLET BY MOUTH EVERY DAY AT 6 PM Patient taking differently: Take 80 mg by mouth at bedtime. 08/25/22 02/21/23 Yes Billie Lade, MD  empagliflozin (JARDIANCE) 10 MG TABS tablet Take 1 tablet (10 mg total) by mouth daily before breakfast. 01/15/23  Yes Billie Lade, MD  ezetimibe (ZETIA) 10 MG tablet Take 1 tablet by mouth once daily 01/15/23  Yes Billie Lade, MD  HYDROcodone-acetaminophen (NORCO) 7.5-325 MG tablet Take 1 tablet by mouth every 6 (six) hours as needed for moderate pain. 12/22/22  Yes [name], MD  lidocaine-prilocaine (EMLA) cream Apply to affected area once 01/13/23  Yes [name], MD  lisinopril (ZESTRIL) 10 MG tablet Take 1 tablet by mouth once daily 12/02/22  Yes [name], MD  Menthol, Topical Analgesic, (BIOFREEZE EX) Apply 1 Application topically daily as needed (pain).   Yes [provider]  nitroGLYCERIN (NITROSTAT) 0.4 MG SL tablet Place 1 tablet (0.4 mg total) under the tongue every 5 (five) minutes as needed. 02/02/18  Yes Arty Baumgartner, NP  prochlorperazine (COMPAZINE) 10 MG tablet Take 1 tablet (10 mg total) by mouth every 6 (six) hours as needed for nausea or vomiting. 01/13/23  Yes [name], MD  aluminum-magnesium hydroxide 200-200 MG/5ML  suspension Take 10 mLs by mouth every 6 (six) hours  as needed for indigestion. Patient not taking: Reported on 01/20/2023 12/31/22   [name], MD  CARBOPLATIN IV Inject into the vein once a week. 12/25/22   [provider]  dapagliflozin propanediol (FARXIGA) 10 MG TABS tablet Take 1 tablet (10 mg total) by mouth daily before breakfast. Patient not taking: Reported on 01/20/2023 12/18/22   [name], MD  metoprolol succinate (TOPROL-XL) 25 MG 24 hr tablet Take 1 tablet (25 mg total) by mouth daily. Patient not taking: Reported on 01/20/2023 01/15/23   Billie Lade, MD  PACLITAXEL IV Inject into the vein once a week. 12/25/22   [provider]  sildenafil (VIAGRA) 50 MG tablet Take 50 mg by mouth daily as needed. Patient not taking: Reported on 01/20/2023 12/02/22   [provider]     Allergies:    No Known Allergies   Physical Exam:   Vitals  Blood pressure (!) 159/99, pulse (!) 107, temperature 98.5 F (36.9 C), temperature source Oral, resp. rate 18, height 5\' 6"  (1.676 m), weight 78.7 kg, SpO2 98%.  Physical Examination: General appearance - alert,  in no distress  Mental status - alert, oriented to person, place, and time,  Eyes - sclera anicteric Neck - supple, no JVD elevation , Chest - clear  to auscultation bilaterally, symmetrical air movement,  Heart - S1 and S2 normal, regular , Rt chest Port-A-Cath in situ Abdomen - soft, nondistended, +BS, epigastric and periumbilical discomfort without rebound or guarding Neurological - screening mental status exam normal, neck supple without rigidity, cranial nerves II through XII intact, DTR's normal and symmetric Extremities - no pedal edema noted, intact peripheral pulses  Skin - warm, dry    Data Review:    CBC Recent Labs  Lab 01/20/23 0752  WBC 13.9*  HGB 11.5*  HCT 35.6*  PLT 191  MCV 86.8  MCH 28.0  MCHC 32.3  RDW 15.9*  LYMPHSABS 1.7  MONOABS 2.1*  EOSABS 0.0  BASOSABS  0.0   ------------------------------------------------------------------------------------------------------------------  Chemistries  Recent Labs  Lab 01/20/23 0752  NA 136  K 3.5  CL 98  CO2 27  GLUCOSE 119*  BUN 14  CREATININE 0.58*  CALCIUM 9.4  AST 20  ALT 31  ALKPHOS 108  BILITOT 0.6   ------------------------------------------------------------------------------------------------------------------ estimated creatinine clearance is 94.5 mL/min (A) (by C-G formula based on SCr of 0.58 mg/dL (L)). ------------------------------------------------------------------------------------------------------------------ -----------------------------------------------------------------------------------------------------------------    Component Value Date/Time   BNP 39.7 12/03/2022 2300     ---------------------------------------------------------------------------------------------------------------  Urinalysis    Component Value Date/Time   COLORURINE YELLOW 01/20/2023 0723   APPEARANCEUR CLEAR 01/20/2023 0723   LABSPEC 1.028 01/20/2023 0723   PHURINE 7.0 01/20/2023 0723   GLUCOSEU >=500 (A) 01/20/2023 0723   HGBUR NEGATIVE 01/20/2023 0723   BILIRUBINUR NEGATIVE 01/20/2023 0723   KETONESUR NEGATIVE 01/20/2023 0723   PROTEINUR 30 (A) 01/20/2023 0723   UROBILINOGEN 1.0 12/07/2014 1339   NITRITE NEGATIVE 01/20/2023 0723   LEUKOCYTESUR NEGATIVE 01/20/2023 0723    ----------------------------------------------------------------------------------------------------------------   Imaging Results:    CT ABDOMEN PELVIS W CONTRAST  Result Date: 01/20/2023 CLINICAL DATA:  Three day history of nausea, vomiting, and diarrhea associated with 1 day of lower abdominal pain EXAM: CT ABDOMEN AND PELVIS WITH CONTRAST TECHNIQUE: Multidetector CT imaging of the abdomen and pelvis was performed using the standard protocol following bolus administration of intravenous contrast. RADIATION  DOSE REDUCTION: This exam was performed according to the departmental dose-optimization program which includes automated exposure control, adjustment of the  mA and/or kV according to patient size and/or use of iterative reconstruction technique. CONTRAST:  OMNIPAQUE IOHEXOL 300 MG/ML  SOLN COMPARISON:  CT abdomen and pelvis dated 12/07/2014 FINDINGS: Lower chest: Partially imaged central venous catheter terminates at the superior cavoatrial junction. No focal consolidation or pulmonary nodule in the lung bases. No pleural effusion or pneumothorax demonstrated. Partially imaged heart size is normal. Coronary artery stents. Coronary artery calcifications. Hepatobiliary: No focal hepatic lesions. No intra or extrahepatic biliary ductal dilation. Normal gallbladder. Pancreas: No focal lesions or main ductal dilation. Spleen: Normal in size without focal abnormality. Adrenals/Urinary Tract: No adrenal nodules. No suspicious renal mass, calculi or hydronephrosis. No focal bladder wall thickening. Stomach/Bowel: Normal appearance of the stomach. No evidence of bowel wall thickening, distention, or inflammatory changes. Colonic diverticulosis without acute diverticulitis. Normal appendix. Vascular/Lymphatic: Aortic atherosclerosis. No enlarged abdominal or pelvic lymph nodes. Reproductive: Enlargement of the prostate with median lobe hypertrophy. Similar asymmetrically more prominent left seminal vesicle. Other: No free fluid, fluid collection, or free air. Musculoskeletal: No acute or abnormal lytic or blastic osseous lesions. Degenerative changes of the hips. IMPRESSION: 1. No acute abnormality in the abdomen or pelvis. 2. Aortic Atherosclerosis (ICD10-I70.0). Coronary artery calcifications. Assessment for potential risk factor modification, dietary therapy or pharmacologic therapy may be warranted, if clinically indicated. Electronically Signed   By: Agustin Cree M.D.   On: 01/20/2023 12:03    Radiological Exams on  Admission: CT ABDOMEN PELVIS W CONTRAST  Result Date: 01/20/2023 CLINICAL DATA:  Three day history of nausea, vomiting, and diarrhea associated with 1 day of lower abdominal pain EXAM: CT ABDOMEN AND PELVIS WITH CONTRAST TECHNIQUE: Multidetector CT imaging of the abdomen and pelvis was performed using the standard protocol following bolus administration of intravenous contrast. RADIATION DOSE REDUCTION: This exam was performed according to the departmental dose-optimization program which includes automated exposure control, adjustment of the mA and/or kV according to patient size and/or use of iterative reconstruction technique. CONTRAST:  OMNIPAQUE IOHEXOL 300 MG/ML  SOLN COMPARISON:  CT abdomen and pelvis dated 12/07/2014 FINDINGS: Lower chest: Partially imaged central venous catheter terminates at the superior cavoatrial junction. No focal consolidation or pulmonary nodule in the lung bases. No pleural effusion or pneumothorax demonstrated. Partially imaged heart size is normal. Coronary artery stents. Coronary artery calcifications. Hepatobiliary: No focal hepatic lesions. No intra or extrahepatic biliary ductal dilation. Normal gallbladder. Pancreas: No focal lesions or main ductal dilation. Spleen: Normal in size without focal abnormality. Adrenals/Urinary Tract: No adrenal nodules. No suspicious renal mass, calculi or hydronephrosis. No focal bladder wall thickening. Stomach/Bowel: Normal appearance of the stomach. No evidence of bowel wall thickening, distention, or inflammatory changes. Colonic diverticulosis without acute diverticulitis. Normal appendix. Vascular/Lymphatic: Aortic atherosclerosis. No enlarged abdominal or pelvic lymph nodes. Reproductive: Enlargement of the prostate with median lobe hypertrophy. Similar asymmetrically more prominent left seminal vesicle. Other: No free fluid, fluid collection, or free air. Musculoskeletal: No acute or abnormal lytic or blastic osseous lesions.  Degenerative changes of the hips. IMPRESSION: 1. No acute abnormality in the abdomen or pelvis. 2. Aortic Atherosclerosis (ICD10-I70.0). Coronary artery calcifications. Assessment for potential risk factor modification, dietary therapy or pharmacologic therapy may be warranted, if clinically indicated. Electronically Signed   By: Agustin Cree M.D.   On: 01/20/2023 12:03    DVT Prophylaxis -SCD/heparin AM Labs Ordered, also please review Full Orders  Family Communication: Admission, patients condition and plan of care including tests being ordered have been discussed with the patient  who  indicate understanding and agree with the plan   Condition -stable  Shon Hale M.D on 01/20/2023 at 7:41 PM Go to [email or web address] -  for contact info  [facility] - Office  [phone]

## 2023-01-20 NOTE — ED Notes (Signed)
Pt mildy restless again. States pain to abd back up and nauseated. Edp aware

## 2023-01-20 NOTE — ED Notes (Signed)
Pt taken to ct. Nad.  

## 2023-01-20 NOTE — ED Triage Notes (Signed)
Pt comes in with complaints of lower abdominal pain starting yesterday and n/v/d starting 3 days ago. Pt states vomiting multiple times and has not had diarrhea since 3 days ago. Pt states no fever and does not believe he has been around anyone sick.

## 2023-01-20 NOTE — ED Notes (Signed)
See triage notes. Pt a/o. Restless. Color wnl. Non diaphoretic.

## 2023-01-20 NOTE — ED Notes (Signed)
Crackers and gingerale given per request and verified by edp.

## 2023-01-21 ENCOUNTER — Inpatient Hospital Stay: Payer: 59

## 2023-01-21 ENCOUNTER — Ambulatory Visit: Payer: 59

## 2023-01-21 ENCOUNTER — Inpatient Hospital Stay: Payer: 59 | Admitting: Hematology

## 2023-01-21 DIAGNOSIS — R1115 Cyclical vomiting syndrome unrelated to migraine: Secondary | ICD-10-CM | POA: Diagnosis not present

## 2023-01-21 DIAGNOSIS — R1110 Vomiting, unspecified: Secondary | ICD-10-CM | POA: Diagnosis not present

## 2023-01-21 LAB — GLUCOSE, CAPILLARY: Glucose-Capillary: 136 mg/dL — ABNORMAL HIGH (ref 70–99)

## 2023-01-21 MED ORDER — METOPROLOL SUCCINATE ER 25 MG PO TB24
25.0000 mg | ORAL_TABLET | Freq: Every day | ORAL | 3 refills | Status: DC
Start: 2023-01-21 — End: 2023-02-24

## 2023-01-21 MED ORDER — ALBUTEROL SULFATE HFA 108 (90 BASE) MCG/ACT IN AERS: 2.0000 | INHALATION_SPRAY | Freq: Four times a day (QID) | RESPIRATORY_TRACT | 2 refills | Status: DC | PRN

## 2023-01-21 MED ORDER — PROCHLORPERAZINE EDISYLATE 10 MG/2ML IJ SOLN
10.0000 mg | Freq: Once | INTRAMUSCULAR | Status: AC
  Administered 2023-01-21: 10 mg via INTRAVENOUS
  Filled 2023-01-21: qty 2

## 2023-01-21 MED ORDER — OLANZAPINE 5 MG PO TABS: 5.0000 mg | ORAL_TABLET | Freq: Every day | ORAL | 1 refills | Status: DC

## 2023-01-21 MED ORDER — PROCHLORPERAZINE MALEATE 10 MG PO TABS
10.0000 mg | ORAL_TABLET | Freq: Four times a day (QID) | ORAL | 3 refills | Status: DC | PRN
Start: 2023-01-21 — End: 2024-01-04

## 2023-01-21 MED ORDER — ORAL CARE MOUTH RINSE: 15.0000 mL | OROMUCOSAL | Status: DC | PRN

## 2023-01-21 NOTE — Plan of Care (Signed)

## 2023-01-21 NOTE — Plan of Care (Signed)
  Problem: Clinical Measurements: Goal: Will remain free from infection Outcome: Progressing   Problem: Activity: Goal: Risk for activity intolerance will decrease Outcome: Progressing   Problem: Pain Managment: Goal: General experience of comfort will improve Outcome: Progressing   

## 2023-01-21 NOTE — Discharge Instructions (Signed)
1)Drink lots of liquids--- take antinausea medication (Compazine/prochlorperazine) as needed for nausea or vomiting --Okay to eat small frequent meals -Okay to drink nutritional supplements like boost or Ensure  2) follow-up with Dr. Ellin Saba your oncologist within a week as previously advised  3) repeat BMP and CBC blood test within 1 week advised

## 2023-01-21 NOTE — Progress Notes (Signed)
   01/21/23 1304  TOC Brief Assessment  Insurance and Status Reviewed  Patient has primary care physician Yes  Home environment has been reviewed HOme with spouse  Prior level of function: independent  Prior/Current Home Services No current home services  Social Determinants of Health Reivew SDOH reviewed no interventions necessary  Readmission risk has been reviewed Yes  Transition of care needs no transition of care needs at this time   Transition of Care Department [facility]) has reviewed patient and no TOC needs have been identified at this time. We will continue to monitor patient advancement through interdisciplinary progression rounds. If new patient transition needs arise, please place a TOC consult.

## 2023-01-21 NOTE — Discharge Summary (Signed)
Rodney Mahoney, is a 62 y.o. male  DOB 1961-04-04  MRN [id number].  Admission date:  01/20/2023  Admitting Physician  Shon Hale, MD  Discharge Date:  01/21/2023   Primary MD  Billie Lade, MD  Recommendations for primary care physician for things to follow:  1)Drink lots of liquids--- take antinausea medication (Compazine/prochlorperazine) as needed for nausea or vomiting --Okay to eat small frequent meals -Okay to drink nutritional supplements like boost or Ensure  2) follow-up with Dr. Ellin Saba your oncologist within a week as previously advised  3) repeat BMP and CBC blood test within 1 week advised  Admission Diagnosis  Emesis, persistent [R11.15] Vomiting, unspecified vomiting type, unspecified whether nausea present [R11.10]   Discharge Diagnosis  Emesis, persistent [R11.15] Vomiting, unspecified vomiting type, unspecified whether nausea present [R11.10]   Principal Problem:   Emesis, persistent Active Problems:   Essential hypertension   Tobacco use   Chronic systolic CHF (congestive heart failure) (HCC)   Coronary artery disease involving native coronary artery of native heart without angina pectoris   Primary cancer of left upper lobe of lung (HCC)   Squamous cell lung cancer, left (HCC)      Past Medical History:  Diagnosis Date   Bronchitis    CAD (coronary artery disease)    a. cath 11/26/2014 EF 45%, moderate to severe inferior hypokinesis, 90% small D1, 100% mid to distal RCA with minimal collateral, 40% mid to distal LCx. Medical therapy as RCA occlusion appears to be completed event; DES to mid circumflex October 2019   Essential hypertension    Hyperlipidemia    Ischemic cardiomyopathy    NSTEMI (non-ST elevated myocardial infarction) (HCC)    11/26/14, 01/31/18   Squamous cell lung cancer, left [facility])     Past Surgical History:  Procedure Laterality Date    BRONCHIAL BIOPSY  12/01/2022   Procedure: BRONCHIAL BIOPSIES;  Surgeon: Martina Sinner, MD;  Location: [facility] ENDOSCOPY;  Service: Pulmonary;;   BRONCHIAL BRUSHINGS  12/01/2022   Procedure: BRONCHIAL BRUSHINGS;  Surgeon: Martina Sinner, MD;  Location: [facility] ENDOSCOPY;  Service: Pulmonary;;   BRONCHIAL WASHINGS  12/01/2022   Procedure: BRONCHIAL WASHINGS;  Surgeon: Martina Sinner, MD;  Location: [facility] ENDOSCOPY;  Service: Pulmonary;;   CARDIAC CATHETERIZATION N/A 11/26/2014   Procedure: Left Heart Cath and Coronary Angiography;  Surgeon: Peter M Swaziland, MD;  Location: [facility] INVASIVE CV LAB;  Service: Cardiovascular;  Laterality: N/A;   COLONOSCOPY     per patient: done in [place], age 9, couple of polyps.   COLONOSCOPY WITH PROPOFOL N/A 10/08/2022   Procedure: COLONOSCOPY WITH PROPOFOL;  Surgeon: Lanelle Bal, DO;  Location: [facility] ENDO SUITE;  Service: Endoscopy;  Laterality: N/A;  11:00 am   CORONARY STENT INTERVENTION N/A 01/31/2018   Procedure: CORONARY STENT INTERVENTION;  Surgeon: Runell Gess, MD;  Location: [facility] INVASIVE CV LAB;  Service: Cardiovascular;  Laterality: N/A;   HEMOSTASIS CONTROL  12/01/2022   Procedure: HEMOSTASIS CONTROL;  Surgeon: Martina Sinner, MD;  Location: [facility]  ENDOSCOPY;  Service: Pulmonary;;  cold saline   IR IMAGING GUIDED PORT INSERTION  12/23/2022   LEFT HEART CATH AND CORONARY ANGIOGRAPHY N/A 01/31/2018   Procedure: LEFT HEART CATH AND CORONARY ANGIOGRAPHY;  Surgeon: Runell Gess, MD;  Location: [facility] INVASIVE CV LAB;  Service: Cardiovascular;  Laterality: N/A;   NECK SURGERY  2005   VIDEO BRONCHOSCOPY N/A 12/01/2022   Procedure: VIDEO BRONCHOSCOPY WITH FLUORO;  Surgeon: Martina Sinner, MD;  Location: [facility] ENDOSCOPY;  Service: Pulmonary;  Laterality: N/A;     HPI  from the history and physical done on the day of admission:  Rodney Mahoney  is a 62 y.o. 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, CAD with prior angioplasty and  stent placement to mid circumflex in 2019, tobacco abuse, HTN, HLD who presents to the ED with persistent emesis after getting chemo on 01/13/2023, he received Fulphila injection on 01/15/23 No fever  Or chills  -Oral antiemetics have not been effective -Emesis without bile or blood -Last BM was 01/18/2023 -Denies chest pain palpitations or dizziness -In the ED failed challenge of oral fluids after getting antiemetics and droperidol =-CT abdomen and pelvis without acute findings -Creatinine 0.58 BUN 14 glucose 119 bicarb 27 ,anion gap 11 -LFTs are not elevated and lipase is WNL -WBC 13.9 patient received  Fulphila injection on 01/15/23 - -Hgb 11.5 UA is not suggestive of UTI     Hospital Course:   1) intractable emesis--- due to chemotherapy-- - Carboplatin, paclitaxel, durvalumab and tremelimumab cycle 1 started on 01/13/2023  -Discussed with Dr. Ellin Saba patient's primary oncologist on 01/20/2023 -In the ED failed challenge of oral fluids after getting antiemetics and droperidol =-CT abdomen and pelvis without acute findings -Creatinine 0.58 BUN 14 glucose 119 bicarb 27 ,anion gap 11 -LFTs are not elevated and lipase is WNL -WBC 13.9 patient received Fulphila injection on 01/15/23 -Nausea vomiting improved with Zyprexa Zofran, lorazepam and Compazine -Patient received IV fluids -No further emesis -Tolerating oral intake well -Follow-up with Dr. 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  No weight loss or hemoptysis. - CT angio chest (11/30/2022): Left upper lobe mass with central necrosis and bulky mediastinal, left hilar and left lower neck adenopathy and lytic lesion in the right superior sternum. - Bronchoscopy (12/01/2022): Narrowing of LUL apical and posterior segment.  Otherwise normal-appearing bronchial trees. - LUL upper lobe  biopsy (12/01/2022): Invasive moderately differentiated squamous cell carcinoma. - PET scan (12/16/2022): Left supraclavicular 3.2 cm mass, direct extension from mediastinal adenopathy.  Small right supraclavicular lymph node.  Large left lung mass 7.4 x 6.1 cm extending into the left hilum and AP window.  Prevascular and paratracheal lymph nodes and right paratracheal node.  Extensive involvement of sternum manubrium and first rib close to manubrium.  Soft tissue extension into the medial border of the overlying pectoralis muscle.  Discrete hypermetabolic right upper lobe nodule 10 mm.  No abdominal or pelvic metastatic disease. - He has received 1 dose of concurrent chemo XRT on 12/23/2022. - Left lung XRT 12 Gray in 4 fractions and left bronchus radiation 18 Gray in 6 fractions completed - [employer] (01/07/2023): PIK3CA, T p53, MSI high not detected. - NGS (01/03/2023): TMB-high.  MSI-stable.  PIK3CA and T p53 mutations present.  No other targetable mutations.  PD-L1 TPS 0%.  HER2 IHC 0. - Carboplatin, paclitaxel, durvalumab and tremelimumab cycle 1 started on 01/13/2023  -Discussed with Dr. Ellin Saba patient's primary oncologist on 01/20/2023 -Follow-up with oncologist as previously advised   3)CAD with prior angioplasty and stent placement to mid circumflex in 2019 -Chest pain-free, continue metoprolol, continue aspirin and atorvastatin   4)Reactive Leukocytosis-- he received Fulphila injection on 01/15/23 -CT abdomen and pelvis without acute infection -UA does not look infected -Repeat CBC within a week   5) Hypertension: --Continue metoprolol -Hold lisinopril due to intractable emesis and risk for AKI   6)DM2--A1c 6.3 reflecting excellent diabetic control PTA Resume Farxiga and Jardiance    Dispo: The patient is from: Home              Anticipated d/c is to: Home  Discharge Condition: stable  Follow UP   Follow-up Information     [name], MD. Schedule an appointment as  soon as possible for a visit.   Specialty: Hematology Contact information: [address] [place] [address] [phone]                 Consults obtained -discussed with primary oncologist  Diet and Activity recommendation:  As advised  Discharge Instructions    Discharge Instructions     Call MD for:  difficulty breathing, headache or visual disturbances   Complete by: As directed    Call MD for:  persistant dizziness or light-headedness   Complete by: As directed    Call MD for:  persistant nausea and vomiting   Complete by: As directed    Call MD for:  temperature >100.4   Complete by: As directed    Diet - low sodium heart healthy   Complete by: As directed    Discharge instructions   Complete by: As directed    1)Drink lots of liquids--- take antinausea medication (Compazine/prochlorperazine) as needed for nausea or vomiting --Okay to eat small frequent meals -Okay to drink nutritional supplements like boost or Ensure  2) follow-up with Dr. Ellin Saba your oncologist within a week as previously advised  3) repeat BMP and CBC blood test within 1 week advised   Increase activity slowly   Complete by: As directed        Discharge Medications     Allergies as of 01/21/2023   No Known Allergies      Medication List     STOP taking these medications    dapagliflozin propanediol 10 MG Tabs tablet Commonly known as: Farxiga   sildenafil 50 MG tablet Commonly known as: VIAGRA       TAKE these medications    acetaminophen 500 MG tablet Commonly known as: TYLENOL Take 500 mg by mouth every 6 (six) hours as needed for mild pain.   albuterol 108 (90 Base) MCG/ACT inhaler Commonly known as: VENTOLIN HFA Inhale 2 puffs into the lungs every 6 (six) hours as needed for wheezing or shortness of breath.   aluminum-magnesium hydroxide 200-200 MG/5ML suspension Take 10 mLs by mouth every 6 (six) hours as needed for indigestion.   aspirin EC 81 MG  tablet Take 1 tablet (81 mg total) by mouth daily. Swallow whole.   atorvastatin 80 MG tablet Commonly known as: LIPITOR Take 1 tablet (80 mg total) by mouth daily. TAKE 1 TABLET BY MOUTH EVERY DAY AT 6 PM What changed:  when to take this additional instructions   BIOFREEZE EX Apply 1 Application topically daily as needed (pain).   CARBOPLATIN IV Inject into  the vein once a week.   empagliflozin 10 MG Tabs tablet Commonly known as: Jardiance Take 1 tablet (10 mg total) by mouth daily before breakfast.   ezetimibe 10 MG tablet Commonly known as: ZETIA Take 1 tablet by mouth once daily   HYDROcodone-acetaminophen 7.5-325 MG tablet Commonly known as: NORCO Take 1 tablet by mouth every 6 (six) hours as needed for moderate pain.   lidocaine-prilocaine cream Commonly known as: EMLA Apply to affected area once   lisinopril 10 MG tablet Commonly known as: ZESTRIL Take 1 tablet by mouth once daily   metoprolol succinate 25 MG 24 hr tablet Commonly known as: TOPROL-XL Take 1 tablet (25 mg total) by mouth daily.   nitroGLYCERIN 0.4 MG SL tablet Commonly known as: Nitrostat Place 1 tablet (0.4 mg total) under the tongue every 5 (five) minutes as needed.   OLANZapine 5 MG tablet Commonly known as: ZYPREXA Take 1 tablet (5 mg total) by mouth at bedtime.   PACLITAXEL IV Inject into the vein once a week.   prochlorperazine 10 MG tablet Commonly known as: COMPAZINE Take 1 tablet (10 mg total) by mouth every 6 (six) hours as needed for nausea or vomiting.       Major procedures and Radiology Reports - PLEASE review detailed and final reports for all details, in brief -   CT ABDOMEN PELVIS W CONTRAST  Result Date: 01/20/2023 CLINICAL DATA:  Three day history of nausea, vomiting, and diarrhea associated with 1 day of lower abdominal pain EXAM: CT ABDOMEN AND PELVIS WITH CONTRAST TECHNIQUE: Multidetector CT imaging of the abdomen and pelvis was performed using the standard  protocol following bolus administration of intravenous contrast. RADIATION DOSE REDUCTION: This exam was performed according to the departmental dose-optimization program which includes automated exposure control, adjustment of the mA and/or kV according to patient size and/or use of iterative reconstruction technique. CONTRAST:  OMNIPAQUE IOHEXOL 300 MG/ML  SOLN COMPARISON:  CT abdomen and pelvis dated 12/07/2014 FINDINGS: Lower chest: Partially imaged central venous catheter terminates at the superior cavoatrial junction. No focal consolidation or pulmonary nodule in the lung bases. No pleural effusion or pneumothorax demonstrated. Partially imaged heart size is normal. Coronary artery stents. Coronary artery calcifications. Hepatobiliary: No focal hepatic lesions. No intra or extrahepatic biliary ductal dilation. Normal gallbladder. Pancreas: No focal lesions or main ductal dilation. Spleen: Normal in size without focal abnormality. Adrenals/Urinary Tract: No adrenal nodules. No suspicious renal mass, calculi or hydronephrosis. No focal bladder wall thickening. Stomach/Bowel: Normal appearance of the stomach. No evidence of bowel wall thickening, distention, or inflammatory changes. Colonic diverticulosis without acute diverticulitis. Normal appendix. Vascular/Lymphatic: Aortic atherosclerosis. No enlarged abdominal or pelvic lymph nodes. Reproductive: Enlargement of the prostate with median lobe hypertrophy. Similar asymmetrically more prominent left seminal vesicle. Other: No free fluid, fluid collection, or free air. Musculoskeletal: No acute or abnormal lytic or blastic osseous lesions. Degenerative changes of the hips. IMPRESSION: 1. No acute abnormality in the abdomen or pelvis. 2. Aortic Atherosclerosis (ICD10-I70.0). Coronary artery calcifications. Assessment for potential risk factor modification, dietary therapy or pharmacologic therapy may be warranted, if clinically indicated. Electronically  Signed   By: Agustin Cree M.D.   On: 01/20/2023 12:03   MR BRAIN W WO CONTRAST  Result Date: 12/29/2022 CLINICAL DATA:  Provided history: Non-small cell lung cancer, staging. Malignant neoplasm of unspecified part of unspecified bronchus or lung. EXAM: MRI HEAD WITHOUT AND WITH CONTRAST TECHNIQUE: Multiplanar, multiecho pulse sequences of the brain and surrounding structures were obtained  without and with intravenous contrast. CONTRAST:  9 mL Vueway intravenous contrast. COMPARISON:  None. FINDINGS: Brain: No age advanced or lobar predominant parenchymal atrophy. Multifocal T2 FLAIR hyperintense signal abnormality within the cerebral white matter, mild but greater than expected for age. These signal changes are nonspecific, but compatible with chronic small vessel ischemic disease. Small chronic infarct within the right cerebellar hemisphere. There is no acute infarct. No evidence of an intracranial mass. No chronic intracranial blood products. No extra-axial fluid collection. No midline shift. No pathologic intracranial enhancement identified. Vascular: Maintained flow voids within the proximal large arterial vessels. Skull and upper cervical spine: No focal suspicious marrow lesion. Sinuses/Orbits: No mass or acute finding within the imaged orbits. Tiny mucous retention cyst within right sphenoid sinus. Minimal mucosal thickening within the bilateral maxillary sinuses. IMPRESSION: 1. No evidence of intracranial metastatic disease. 2. Chronic small vessel ischemic changes within the cerebral white matter, mild but greater than expected for age. 3. Small chronic infarct within the right cerebellar hemisphere. 4. Minor paranasal sinus disease. Electronically Signed   By: Jackey Loge D.O.   On: 12/29/2022 18:10   IR IMAGING GUIDED PORT INSERTION  Result Date: 12/23/2022 CLINICAL DATA:  Non-small cell left lung carcinoma, needs durable venous access for planned treatment regimen EXAM: TUNNELED PORT CATHETER  PLACEMENT WITH ULTRASOUND AND FLUOROSCOPIC GUIDANCE FLUOROSCOPY: Radiation Exposure Index (as provided by the fluoroscopic device): 1 mGy air Kerma ANESTHESIA/SEDATION: Intravenous Fentanyl and Versed 1mg  were administered as conscious sedation during continuous monitoring of the patient's level of consciousness and physiological / cardiorespiratory status by the radiology RN, with a total moderate sedation time of 15 minutes. TECHNIQUE: The procedure, risks, benefits, and alternatives were explained to the patient. Questions regarding the procedure were encouraged and answered. The patient understands and consents to the procedure. Patency of the right IJ vein was confirmed with ultrasound with image documentation. An appropriate skin site was determined. Skin site was marked. Region was prepped using maximum barrier technique including cap and mask, sterile gown, sterile gloves, large sterile sheet, and Chlorhexidine as cutaneous antisepsis. The region was infiltrated locally with 1% lidocaine. Under real-time ultrasound guidance, the right IJ vein was accessed with a 21 gauge micropuncture needle; the needle tip within the vein was confirmed with ultrasound image documentation. Needle was exchanged over a 018 guidewire for transitional dilator, and vascular measurement was performed. A small incision was made on the right anterior chest wall and a subcutaneous pocket fashioned. The power-injectable port was positioned and its catheter tunneled to the right IJ dermatotomy site. The transitional dilator was exchanged over an Amplatz wire for a peel-away sheath, through which the port catheter, which had been trimmed to the appropriate length, was advanced and positioned under fluoroscopy with its tip at the cavoatrial junction. Spot chest radiograph confirms good catheter position and no pneumothorax. The port was flushed per protocol. The pocket was closed with deep interrupted and subcuticular continuous  3-0 Monocryl sutures. The incisions were covered with Dermabond then covered with a sterile dressing. The patient tolerated the procedure well. COMPLICATIONS: COMPLICATIONS None immediate IMPRESSION: Technically successful right IJ power-injectable port catheter placement. Ready for routine use. Electronically Signed   By: Corlis Leak M.D.   On: 12/23/2022 15:30    Today   Subjective    Rodney Mahoney today has no new complaints No fever  Or chills  -No further emesis -Tolerating oral intake well -Had BM          Patient has  been seen and examined prior to discharge   Objective   Blood pressure 130/75, pulse 90, temperature 98.5 F (36.9 C), temperature source Oral, resp. rate 17, height 5\' 6"  (1.676 m), weight 78.7 kg, SpO2 97%.   Intake/Output Summary (Last 24 hours) at 01/21/2023 1649 Last data filed at 01/21/2023 1600 Gross per 24 hour  Intake 3671.38 ml  Output --  Net 3671.38 ml   Exam Gen:- Awake Alert, no acute distress  HEENT:- [place].AT, No sclera icterus Neck-Supple Neck,No JVD,.  Lungs-  CTAB , good air movement bilaterally CV- S1, S2 normal, regular, Rt chest Port-A-Cath in situ  Abd-  +ve B.Sounds, Abd Soft, No tenderness,    Extremity/Skin:- No  edema,   good pulses Psych-affect is appropriate, oriented x3 Neuro-no new focal deficits, no tremors    Data Review   CBC w Diff:  Lab Results  Component Value Date   WBC 15.1 (H) 01/21/2023   HGB 10.9 (L) 01/21/2023   HGB 13.1 10/28/2022   HCT 32.9 (L) 01/21/2023   HCT 40.1 10/28/2022   PLT 180 01/21/2023   PLT 363 10/28/2022   LYMPHOPCT 12 01/20/2023   BANDSPCT 3 01/20/2023   MONOPCT 15 01/20/2023   EOSPCT 0 01/20/2023   BASOPCT 0 01/20/2023    CMP:  Lab Results  Component Value Date   NA 136 01/21/2023   NA 137 10/28/2022   K 4.1 01/21/2023   CL 102 01/21/2023   CO2 24 01/21/2023   BUN 9 01/21/2023   BUN 10 10/28/2022   CREATININE 0.66 01/21/2023   CREATININE 0.78 07/04/2021   PROT 7.3  01/20/2023   PROT 7.2 10/28/2022   ALBUMIN 3.8 01/20/2023   ALBUMIN 4.1 10/28/2022   BILITOT 0.6 01/20/2023   BILITOT 0.3 10/28/2022   ALKPHOS 108 01/20/2023   AST 20 01/20/2023   ALT 31 01/20/2023  .  Total Discharge time is about 33 minutes  Shon Hale M.D on 01/21/2023 at 4:49 PM  Go to [email or web address] -  for contact info  [facility] - Office  [phone]

## 2023-01-22 ENCOUNTER — Ambulatory Visit: Payer: 59

## 2023-01-25 ENCOUNTER — Ambulatory Visit: Payer: 59

## 2023-01-25 ENCOUNTER — Encounter (HOSPITAL_COMMUNITY): Payer: Self-pay | Admitting: Hematology

## 2023-01-26 ENCOUNTER — Ambulatory Visit: Payer: 59

## 2023-01-27 ENCOUNTER — Ambulatory Visit: Payer: 59

## 2023-01-28 ENCOUNTER — Inpatient Hospital Stay: Payer: 59 | Admitting: Hematology

## 2023-01-28 ENCOUNTER — Inpatient Hospital Stay: Payer: 59

## 2023-01-28 ENCOUNTER — Ambulatory Visit: Payer: 59

## 2023-01-29 ENCOUNTER — Ambulatory Visit: Payer: 59

## 2023-02-02 ENCOUNTER — Other Ambulatory Visit: Payer: Self-pay

## 2023-02-02 NOTE — Progress Notes (Signed)
[facility] [address], [place] [address]    Clinic Day:  02/03/2023  Referring physician: Billie Lade, MD  Patient Care Team: Billie Lade, MD as PCP - General (Internal Medicine) [name], MD as PCP - Cardiology (Cardiology) [name], MD as Consulting Physician (Internal Medicine) [name], RN as Oncology Nurse Navigator (Medical Oncology) [name], MD as Medical Oncologist (Medical Oncology)   ASSESSMENT & PLAN:   Assessment: 1.  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  No weight loss or hemoptysis. - CT angio chest (11/30/2022): Left upper lobe mass with central necrosis and bulky mediastinal, left hilar and left lower neck adenopathy and lytic lesion in the right superior sternum. - Bronchoscopy (12/01/2022): Narrowing of LUL apical and posterior segment.  Otherwise normal-appearing bronchial trees. - LUL upper lobe biopsy (12/01/2022): Invasive moderately differentiated squamous cell carcinoma. - PET scan (12/16/2022): Left supraclavicular 3.2 cm mass, direct extension from mediastinal adenopathy.  Small right supraclavicular lymph node.  Large left lung mass 7.4 x 6.1 cm extending into the left hilum and AP window.  Prevascular and paratracheal lymph nodes and right paratracheal node.  Extensive involvement of sternum manubrium and first rib close to manubrium.  Soft tissue extension into the medial border of the overlying pectoralis muscle.  Discrete hypermetabolic right upper lobe nodule 10 mm.  No abdominal or pelvic metastatic disease. - He has received 1 dose of concurrent chemo XRT on 12/23/2022. - Left lung XRT 12 Gray in 4 fractions and left bronchus radiation 18 Gray in 6 fractions completed - [employer] (01/07/2023): PIK3CA, T p53, MSI high not detected. - NGS (01/03/2023): TMB-high.  MSI-stable.  PIK3CA and T p53  mutations present.  No other targetable mutations.  PD-L1 TPS 0%.  HER2 IHC 0. - Carboplatin, paclitaxel, durvalumab and tremelimumab cycle 1 started on 01/13/2023   2.  Social/family history: - Lives at home by himself.  His wife helps him but they are separated.  He is independent of ADLs and IADLs.  Retired from work in April 2024.  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  Current active smoker, 1 pack/day started at age 78 and quit at the time of diagnosis. - Sister had cancer.  2 paternal cousins had cancer.    Plan: 1.  Stage IV (T4 N2 M1) squamous cell lung cancer: - He received cycle 1 of chemoimmunotherapy on 01/13/2023. - He reported nausea and vomiting on day 4.  He was admitted from 01/20/2023 through 01/21/2023 with nausea and vomiting.  After discharge, he did not have any recurrence of symptoms. - No immunotherapy related symptoms.  He had occasional numbness in the fingertips. - Reviewed labs today: Normal LFTs.  Creatinine normal.  CBC grossly normal. - Proceed with cycle 2 today.  Will increase dexamethasone to 20 mg in the premeds.  He is already receiving Emend.   2.  Chest and neck tightness/pain: - Continue Norco 7.5/325 as needed.  He is not requiring it daily.   3.  Hypertension: - Continue to hold lisinopril and Toprol-XL.  Blood pressure is 134/70.  4.  Nausea/vomiting: - Continue Zyprexa 5 mg daily.  He receives Aloxi, Emend and dexamethasone and premeds. - Use Compazine 10 mg every 6 hours as needed starting day 3.    No orders of the defined types were placed in this encounter.  I,Katie Daubenspeck,acting as a Neurosurgeon for [name], MD.,have documented all relevant documentation on the behalf of [name], MD,as directed by  [name], MD while in the presence of [name], MD.   I, [name] MD, have reviewed the above documentation for accuracy and completeness, and I agree with the  above.   [name], MD   8/21/20246:06 PM  CHIEF COMPLAINT:   Diagnosis: metastatic squamous cell lung cancer   Cancer Staging  Squamous cell lung cancer, left (HCC) Staging form: Lung, AJCC 8th Edition - Clinical stage from 12/22/2022: Stage IVA (cT4, cN3, cM1a) - Unsigned    Prior Therapy: radiation therapy 12/15/22 - 12/29/22, with one dose carboplatin/taxol on 12/25/22  Current Therapy:  carboplatin/paclitaxel, durvalumab   HISTORY OF PRESENT ILLNESS:   Oncology History  Squamous cell lung cancer, left (HCC)  12/22/2022 Initial Diagnosis   Squamous cell lung cancer, left (HCC)   12/25/2022 - 12/25/2022 Chemotherapy   Patient is on Treatment Plan : ESOPHAGUS Carboplatin + Paclitaxel Weekly X 6 Weeks with XRT     01/13/2023 -  Chemotherapy   Patient is on Treatment Plan : LUNG NSCLC Squamous Tremelimumab-actl (cycles 1,2,3,4,6) + Durvalumab + PAClitaxel + Carboplatin q21 days x 4 cycles / Durvalumab q28 days        INTERVAL HISTORY:   Rodney Mahoney is a 62 y.o. male presenting to clinic today for follow up of metastatic squamous cell lung cancer. He was last seen by me on 01/13/23.  Since his last visit, he presented to the ED on 01/20/23 with persistent emesis. He was admitted for observation and subsequently discharged the following day.  Today, he states that he is doing well overall. His appetite level is at 90%. His energy level is at 75%.  PAST MEDICAL HISTORY:   Past Medical History: Past Medical History:  Diagnosis Date   Bronchitis    CAD (coronary artery disease)    a. cath 11/26/2014 EF 45%, moderate to severe inferior hypokinesis, 90% small D1, 100% mid to distal RCA with minimal collateral, 40% mid to distal LCx. Medical therapy as RCA occlusion appears to be completed event; DES to mid circumflex October 2019   Essential hypertension    Hyperlipidemia    Ischemic cardiomyopathy    NSTEMI (non-ST elevated myocardial infarction) (HCC)    11/26/14, 01/31/18    Squamous cell lung cancer, left [facility])     Surgical History: Past Surgical History:  Procedure Laterality Date   BRONCHIAL BIOPSY  12/01/2022   Procedure: BRONCHIAL BIOPSIES;  Surgeon: Martina Sinner, MD;  Location: [facility] ENDOSCOPY;  Service: Pulmonary;;   BRONCHIAL BRUSHINGS  12/01/2022   Procedure: BRONCHIAL BRUSHINGS;  Surgeon: Martina Sinner, MD;  Location: [facility] ENDOSCOPY;  Service: Pulmonary;;   BRONCHIAL WASHINGS  12/01/2022   Procedure: BRONCHIAL WASHINGS;  Surgeon: Martina Sinner, MD;  Location: [facility] ENDOSCOPY;  Service: Pulmonary;;   CARDIAC CATHETERIZATION N/A 11/26/2014   Procedure: Left Heart Cath and Coronary Angiography;  Surgeon: Peter M Swaziland, MD;  Location: [facility] INVASIVE CV LAB;  Service: Cardiovascular;  Laterality: N/A;   COLONOSCOPY     per patient: done in [place], age 71, couple of polyps.   COLONOSCOPY WITH PROPOFOL N/A 10/08/2022   Procedure: COLONOSCOPY WITH PROPOFOL;  Surgeon: Lanelle Bal, DO;  Location: [facility] ENDO SUITE;  Service: Endoscopy;  Laterality: N/A;  11:00 am   CORONARY STENT INTERVENTION N/A 01/31/2018   Procedure: CORONARY STENT INTERVENTION;  Surgeon: Runell Gess, MD;  Location: [facility] INVASIVE  CV LAB;  Service: Cardiovascular;  Laterality: N/A;   HEMOSTASIS CONTROL  12/01/2022   Procedure: HEMOSTASIS CONTROL;  Surgeon: Martina Sinner, MD;  Location: [facility] ENDOSCOPY;  Service: Pulmonary;;  cold saline   IR IMAGING GUIDED PORT INSERTION  12/23/2022   LEFT HEART CATH AND CORONARY ANGIOGRAPHY N/A 01/31/2018   Procedure: LEFT HEART CATH AND CORONARY ANGIOGRAPHY;  Surgeon: Runell Gess, MD;  Location: [facility] INVASIVE CV LAB;  Service: Cardiovascular;  Laterality: N/A;   NECK SURGERY  2005   VIDEO BRONCHOSCOPY N/A 12/01/2022   Procedure: VIDEO BRONCHOSCOPY WITH FLUORO;  Surgeon: Martina Sinner, MD;  Location: [facility] ENDOSCOPY;  Service: Pulmonary;  Laterality: N/A;    Social History: Social History   Socioeconomic History   Marital status: Married     Spouse name: Not on file   Number of children: Not on file   Years of education: Not on file   Highest education level: Not on file  Occupational History   Not on file  Tobacco Use   Smoking status: Every Day    Current packs/day: 1.00    Average packs/day: 1 pack/day for 42.0 years (42.0 ttl pk-yrs)    Types: Cigarettes   Smokeless tobacco: Never   Tobacco comments:    He used to quit and start back. He has not quit for the past 5 years.   Vaping Use   Vaping status: Never Used  Substance and Sexual Activity   Alcohol use: Yes    Comment: 10 cans of beer   Drug use: Yes    Types: Marijuana    Comment: smokes week here and there   Sexual activity: Yes    Birth control/protection: None  Other Topics Concern   Not on file  Social History Narrative   Works at [employer] and live with wife.    Social Determinants of Health   Financial Resource Strain: High Risk (12/31/2022)   Overall Financial Resource Strain (CARDIA)    Difficulty of Paying Living Expenses: Very hard  Food Insecurity: No Food Insecurity (01/20/2023)   Hunger Vital Sign    Worried About Running Out of Food in the Last Year: Never true    Ran Out of Food in the Last Year: Never true  Transportation Needs: No Transportation Needs (01/20/2023)   PRAPARE - Administrator, Civil Service (Medical): No    Lack of Transportation (Non-Medical): No  Physical Activity: Not on file  Stress: Not on file  Social Connections: Not on file  Intimate Partner Violence: Not At Risk (01/20/2023)   Humiliation, Afraid, Rape, and Kick questionnaire    Fear of Current or Ex-Partner: No    Emotionally Abused: No    Physically Abused: No    Sexually Abused: No    Family History: Family History  Problem Relation Age of Onset   Diabetes Mother    Heart attack Father    Cancer Sister    Hypertension Brother    Colon cancer Neg Hx    Prostate cancer Neg Hx    Lung cancer Neg Hx     Current  Medications:  Current Outpatient Medications:    acetaminophen (TYLENOL) 500 MG tablet, Take 500 mg by mouth every 6 (six) hours as needed for mild pain., Disp: , Rfl:    albuterol (VENTOLIN HFA) 108 (90 Base) MCG/ACT inhaler, Inhale 2 puffs into the lungs every 6 (six) hours as needed for wheezing or shortness of breath., Disp: 18 g, Rfl: 2  aluminum-magnesium hydroxide 200-200 MG/5ML suspension, Take 10 mLs by mouth every 6 (six) hours as needed for indigestion., Disp: 480 mL, Rfl: 2   aspirin EC 81 MG tablet, Take 1 tablet (81 mg total) by mouth daily. Swallow whole., Disp: 90 tablet, Rfl: 3   atorvastatin (LIPITOR) 80 MG tablet, Take 1 tablet (80 mg total) by mouth daily. TAKE 1 TABLET BY MOUTH EVERY DAY AT 6 PM (Patient taking differently: Take 80 mg by mouth at bedtime.), Disp: 90 tablet, Rfl: 1   CARBOPLATIN IV, Inject into the vein once a week., Disp: , Rfl:    empagliflozin (JARDIANCE) 10 MG TABS tablet, Take 1 tablet (10 mg total) by mouth daily before breakfast., Disp: 30 tablet, Rfl: 3   ezetimibe (ZETIA) 10 MG tablet, Take 1 tablet by mouth once daily, Disp: 90 tablet, Rfl: 0   HYDROcodone-acetaminophen (NORCO) 7.5-325 MG tablet, Take 1 tablet by mouth every 6 (six) hours as needed for moderate pain., Disp: 120 tablet, Rfl: 0   lidocaine-prilocaine (EMLA) cream, Apply to affected area once, Disp: 30 g, Rfl: 3   lisinopril (ZESTRIL) 10 MG tablet, Take 1 tablet by mouth once daily, Disp: 30 tablet, Rfl: 0   Menthol, Topical Analgesic, (BIOFREEZE EX), Apply 1 Application topically daily as needed (pain)., Disp: , Rfl:    metoprolol succinate (TOPROL-XL) 25 MG 24 hr tablet, Take 1 tablet (25 mg total) by mouth daily., Disp: 90 tablet, Rfl: 3   nitroGLYCERIN (NITROSTAT) 0.4 MG SL tablet, Place 1 tablet (0.4 mg total) under the tongue every 5 (five) minutes as needed., Disp: 25 tablet, Rfl: 0   OLANZapine (ZYPREXA) 5 MG tablet, Take 1 tablet (5 mg total) by mouth at bedtime., Disp: 30  tablet, Rfl: 1   PACLITAXEL IV, Inject into the vein once a week., Disp: , Rfl:    prochlorperazine (COMPAZINE) 10 MG tablet, Take 1 tablet (10 mg total) by mouth every 6 (six) hours as needed for nausea or vomiting., Disp: 60 tablet, Rfl: 3 No current facility-administered medications for this visit.  Facility-Administered Medications Ordered in Other Visits:    sodium chloride flush (NS) 0.9 % injection 10 mL, 10 mL, Intracatheter, PRN, [name], MD, 10 mL at 02/03/23 1702   Allergies: No Known Allergies  REVIEW OF SYSTEMS:   Review of Systems  Constitutional:  Negative for chills, fatigue and fever.  HENT:   Negative for lump/mass, mouth sores, nosebleeds, sore throat and trouble swallowing.   Eyes:  Negative for eye problems.  Respiratory:  Positive for shortness of breath. Negative for cough.   Cardiovascular:  Negative for chest pain, leg swelling and palpitations.  Gastrointestinal:  Negative for abdominal pain, constipation, diarrhea, nausea and vomiting.  Genitourinary:  Negative for bladder incontinence, difficulty urinating, dysuria, frequency, hematuria and nocturia.   Musculoskeletal:  Negative for arthralgias, back pain, flank pain, myalgias and neck pain.  Skin:  Negative for itching and rash.  Neurological:  Positive for dizziness. Negative for headaches and numbness.  Hematological:  Does not bruise/bleed easily.  Psychiatric/Behavioral:  Negative for depression, sleep disturbance and suicidal ideas. The patient is not nervous/anxious.   All other systems reviewed and are negative.    VITALS:   There were no vitals taken for this visit.  Wt Readings from Last 3 Encounters:  02/03/23 176 lb 8 oz (80.1 kg)  01/20/23 173 lb 6.3 oz (78.7 kg)  01/19/23 173 lb 6.4 oz (78.7 kg)    There is no height or weight on file  to calculate BMI.  Performance status (ECOG): 1 - Symptomatic but completely ambulatory  PHYSICAL EXAM:   Physical Exam Vitals and  nursing note reviewed. Exam conducted with a chaperone present.  Constitutional:      Appearance: Normal appearance.  Cardiovascular:     Rate and Rhythm: Normal rate and regular rhythm.     Pulses: Normal pulses.     Heart sounds: Normal heart sounds.  Pulmonary:     Effort: Pulmonary effort is normal.     Breath sounds: Normal breath sounds.  Abdominal:     Palpations: Abdomen is soft. There is no hepatomegaly, splenomegaly or mass.     Tenderness: There is no abdominal tenderness.  Musculoskeletal:     Right lower leg: No edema.     Left lower leg: No edema.  Lymphadenopathy:     Cervical: No cervical adenopathy.     Right cervical: No superficial, deep or posterior cervical adenopathy.    Left cervical: No superficial, deep or posterior cervical adenopathy.     Upper Body:     Right upper body: No supraclavicular or axillary adenopathy.     Left upper body: No supraclavicular or axillary adenopathy.  Neurological:     General: No focal deficit present.     Mental Status: He is alert and oriented to person, place, and time.  Psychiatric:        Mood and Affect: Mood normal.        Behavior: Behavior normal.     LABS:      Latest Ref Rng & Units 02/03/2023    7:50 AM 01/21/2023    4:55 AM 01/20/2023    7:52 AM  CBC  WBC 4.0 - 10.5 K/uL 6.9  15.1  13.9   Hemoglobin 13.0 - 17.0 g/dL 16.1  09.6  04.5   Hematocrit 39.0 - 52.0 % 33.6  32.9  35.6   Platelets 150 - 400 K/uL 249  180  191       Latest Ref Rng & Units 02/03/2023    7:50 AM 01/21/2023    4:55 AM 01/20/2023    7:52 AM  CMP  Glucose 70 - 99 mg/dL 409  93  811   BUN 8 - 23 mg/dL 8  9  14    Creatinine 0.61 - 1.24 mg/dL 9.14  7.82  9.56   Sodium 135 - 145 mmol/L 137  136  136   Potassium 3.5 - 5.1 mmol/L 3.8  4.1  3.5   Chloride 98 - 111 mmol/L 104  102  98   CO2 22 - 32 mmol/L 27  24  27    Calcium 8.9 - 10.3 mg/dL 8.7  9.0  9.4   Total Protein 6.5 - 8.1 g/dL 6.3   7.3   Total Bilirubin 0.3 - 1.2 mg/dL 0.7   0.6    Alkaline Phos 38 - 126 U/L 88   108   AST 15 - 41 U/L 21   20   ALT 0 - 44 U/L 32   31      No results found for: "CEA1", "CEA" / No results found for: "CEA1", "CEA" Lab Results  Component Value Date   PSA1 0.6 07/31/2021   No results found for: "OZH086" No results found for: "CAN125"  No results found for: "TOTALPROTELP", "ALBUMINELP", "A1GS", "A2GS", "BETS", "BETA2SER", "GAMS", "MSPIKE", "SPEI" No results found for: "TIBC", "FERRITIN", "IRONPCTSAT" No results found for: "LDH"   STUDIES:   CT ABDOMEN PELVIS W CONTRAST  Result Date: 01/20/2023 CLINICAL DATA:  Three day history of nausea, vomiting, and diarrhea associated with 1 day of lower abdominal pain EXAM: CT ABDOMEN AND PELVIS WITH CONTRAST TECHNIQUE: Multidetector CT imaging of the abdomen and pelvis was performed using the standard protocol following bolus administration of intravenous contrast. RADIATION DOSE REDUCTION: This exam was performed according to the departmental dose-optimization program which includes automated exposure control, adjustment of the mA and/or kV according to patient size and/or use of iterative reconstruction technique. CONTRAST:  OMNIPAQUE IOHEXOL 300 MG/ML  SOLN COMPARISON:  CT abdomen and pelvis dated 12/07/2014 FINDINGS: Lower chest: Partially imaged central venous catheter terminates at the superior cavoatrial junction. No focal consolidation or pulmonary nodule in the lung bases. No pleural effusion or pneumothorax demonstrated. Partially imaged heart size is normal. Coronary artery stents. Coronary artery calcifications. Hepatobiliary: No focal hepatic lesions. No intra or extrahepatic biliary ductal dilation. Normal gallbladder. Pancreas: No focal lesions or main ductal dilation. Spleen: Normal in size without focal abnormality. Adrenals/Urinary Tract: No adrenal nodules. No suspicious renal mass, calculi or hydronephrosis. No focal bladder wall thickening. Stomach/Bowel: Normal appearance of  the stomach. No evidence of bowel wall thickening, distention, or inflammatory changes. Colonic diverticulosis without acute diverticulitis. Normal appendix. Vascular/Lymphatic: Aortic atherosclerosis. No enlarged abdominal or pelvic lymph nodes. Reproductive: Enlargement of the prostate with median lobe hypertrophy. Similar asymmetrically more prominent left seminal vesicle. Other: No free fluid, fluid collection, or free air. Musculoskeletal: No acute or abnormal lytic or blastic osseous lesions. Degenerative changes of the hips. IMPRESSION: 1. No acute abnormality in the abdomen or pelvis. 2. Aortic Atherosclerosis (ICD10-I70.0). Coronary artery calcifications. Assessment for potential risk factor modification, dietary therapy or pharmacologic therapy may be warranted, if clinically indicated. Electronically Signed   By: Agustin Cree M.D.   On: 01/20/2023 12:03

## 2023-02-03 ENCOUNTER — Inpatient Hospital Stay: Payer: 59

## 2023-02-03 ENCOUNTER — Inpatient Hospital Stay (HOSPITAL_BASED_OUTPATIENT_CLINIC_OR_DEPARTMENT_OTHER): Payer: 59 | Admitting: Hematology

## 2023-02-03 VITALS — BP 134/70 | HR 86 | Temp 96.4°F | Resp 18 | Wt 176.5 lb

## 2023-02-03 VITALS — BP 121/64 | HR 89 | Temp 98.4°F | Resp 18

## 2023-02-03 DIAGNOSIS — F17210 Nicotine dependence, cigarettes, uncomplicated: Secondary | ICD-10-CM | POA: Diagnosis not present

## 2023-02-03 DIAGNOSIS — Z5112 Encounter for antineoplastic immunotherapy: Secondary | ICD-10-CM | POA: Diagnosis not present

## 2023-02-03 DIAGNOSIS — C3492 Malignant neoplasm of unspecified part of left bronchus or lung: Secondary | ICD-10-CM

## 2023-02-03 DIAGNOSIS — Z5111 Encounter for antineoplastic chemotherapy: Secondary | ICD-10-CM | POA: Diagnosis not present

## 2023-02-03 DIAGNOSIS — Z95828 Presence of other vascular implants and grafts: Secondary | ICD-10-CM

## 2023-02-03 DIAGNOSIS — R112 Nausea with vomiting, unspecified: Secondary | ICD-10-CM | POA: Diagnosis not present

## 2023-02-03 DIAGNOSIS — C3412 Malignant neoplasm of upper lobe, left bronchus or lung: Secondary | ICD-10-CM | POA: Diagnosis not present

## 2023-02-03 DIAGNOSIS — Z5189 Encounter for other specified aftercare: Secondary | ICD-10-CM | POA: Diagnosis not present

## 2023-02-03 DIAGNOSIS — R079 Chest pain, unspecified: Secondary | ICD-10-CM | POA: Diagnosis not present

## 2023-02-03 DIAGNOSIS — Z809 Family history of malignant neoplasm, unspecified: Secondary | ICD-10-CM | POA: Diagnosis not present

## 2023-02-03 DIAGNOSIS — I10 Essential (primary) hypertension: Secondary | ICD-10-CM | POA: Diagnosis not present

## 2023-02-03 LAB — CBC WITH DIFFERENTIAL/PLATELET
Abs Immature Granulocytes: 0.01 10*3/uL (ref 0.00–0.07)
Basophils Absolute: 0 10*3/uL (ref 0.0–0.1)
Basophils Relative: 0 %
Eosinophils Absolute: 0.1 10*3/uL (ref 0.0–0.5)
Eosinophils Relative: 2 %
HCT: 33.6 % — ABNORMAL LOW (ref 39.0–52.0)
Hemoglobin: 10.7 g/dL — ABNORMAL LOW (ref 13.0–17.0)
Immature Granulocytes: 0 %
Lymphocytes Relative: 44 %
Lymphs Abs: 3 10*3/uL (ref 0.7–4.0)
MCH: 28.5 pg (ref 26.0–34.0)
MCHC: 31.8 g/dL (ref 30.0–36.0)
MCV: 89.4 fL (ref 80.0–100.0)
Monocytes Absolute: 0.8 10*3/uL (ref 0.1–1.0)
Monocytes Relative: 12 %
Neutro Abs: 2.9 10*3/uL (ref 1.7–7.7)
Neutrophils Relative %: 42 %
Platelets: 249 10*3/uL (ref 150–400)
RBC: 3.76 MIL/uL — ABNORMAL LOW (ref 4.22–5.81)
RDW: 19.3 % — ABNORMAL HIGH (ref 11.5–15.5)
WBC: 6.9 10*3/uL (ref 4.0–10.5)
nRBC: 0 % (ref 0.0–0.2)

## 2023-02-03 LAB — COMPREHENSIVE METABOLIC PANEL
ALT: 32 U/L (ref 0–44)
AST: 21 U/L (ref 15–41)
Albumin: 3.3 g/dL — ABNORMAL LOW (ref 3.5–5.0)
Alkaline Phosphatase: 88 U/L (ref 38–126)
Anion gap: 6 (ref 5–15)
BUN: 8 mg/dL (ref 8–23)
CO2: 27 mmol/L (ref 22–32)
Calcium: 8.7 mg/dL — ABNORMAL LOW (ref 8.9–10.3)
Chloride: 104 mmol/L (ref 98–111)
Creatinine, Ser: 0.46 mg/dL — ABNORMAL LOW (ref 0.61–1.24)
GFR, Estimated: >60 mL/min (ref >60)
Glucose, Bld: 106 mg/dL — ABNORMAL HIGH (ref 70–99)
Potassium: 3.8 mmol/L (ref 3.5–5.1)
Sodium: 137 mmol/L (ref 135–145)
Total Bilirubin: 0.7 mg/dL (ref 0.3–1.2)
Total Protein: 6.3 g/dL — ABNORMAL LOW (ref 6.5–8.1)

## 2023-02-03 LAB — MAGNESIUM: Magnesium: 2.1 mg/dL (ref 1.7–2.4)

## 2023-02-03 MED ORDER — SODIUM CHLORIDE 0.9 % IV SOLN
664.5000 mg | Freq: Once | INTRAVENOUS | Status: AC
  Administered 2023-02-03: 660 mg via INTRAVENOUS
  Filled 2023-02-03: qty 66

## 2023-02-03 MED ORDER — SODIUM CHLORIDE 0.9 % IV SOLN
1500.0000 mg | Freq: Once | INTRAVENOUS | Status: AC
  Administered 2023-02-03: 1500 mg via INTRAVENOUS
  Filled 2023-02-03: qty 30

## 2023-02-03 MED ORDER — HEPARIN SOD (PORK) LOCK FLUSH 100 UNIT/ML IV SOLN
500.0000 [IU] | Freq: Once | INTRAVENOUS | Status: AC | PRN
  Administered 2023-02-03: 500 [IU]

## 2023-02-03 MED ORDER — CETIRIZINE HCL 10 MG/ML IV SOLN
10.0000 mg | Freq: Once | INTRAVENOUS | Status: AC
  Administered 2023-02-03: 10 mg via INTRAVENOUS
  Filled 2023-02-03: qty 1

## 2023-02-03 MED ORDER — SODIUM CHLORIDE 0.9% FLUSH
10.0000 mL | INTRAVENOUS | Status: DC | PRN
  Administered 2023-02-03: 10 mL

## 2023-02-03 MED ORDER — SODIUM CHLORIDE 0.9 % IV SOLN
150.0000 mg | Freq: Once | INTRAVENOUS | Status: AC
  Administered 2023-02-03: 150 mg via INTRAVENOUS
  Filled 2023-02-03: qty 150

## 2023-02-03 MED ORDER — SODIUM CHLORIDE 0.9% FLUSH
10.0000 mL | INTRAVENOUS | Status: DC | PRN
  Administered 2023-02-03: 10 mL via INTRAVENOUS

## 2023-02-03 MED ORDER — TREMELIMUMAB-ACTL CHEMO INJECTION 25 MG/1.25ML
75.0000 mg | Freq: Once | INTRAVENOUS | Status: AC
  Administered 2023-02-03: 75 mg via INTRAVENOUS
  Filled 2023-02-03: qty 3.75

## 2023-02-03 MED ORDER — PALONOSETRON HCL INJECTION 0.25 MG/5ML
0.2500 mg | Freq: Once | INTRAVENOUS | Status: AC
  Administered 2023-02-03: 0.25 mg via INTRAVENOUS
  Filled 2023-02-03: qty 5

## 2023-02-03 MED ORDER — SODIUM CHLORIDE 0.9 % IV SOLN
20.0000 mg | Freq: Once | INTRAVENOUS | Status: AC
  Administered 2023-02-03: 20 mg via INTRAVENOUS
  Filled 2023-02-03: qty 2

## 2023-02-03 MED ORDER — SODIUM CHLORIDE 0.9 % IV SOLN: Freq: Once | INTRAVENOUS | Status: AC

## 2023-02-03 MED ORDER — SODIUM CHLORIDE 0.9 % IV SOLN
175.0000 mg/m2 | Freq: Once | INTRAVENOUS | Status: AC
  Administered 2023-02-03: 336 mg via INTRAVENOUS
  Filled 2023-02-03: qty 56

## 2023-02-03 MED ORDER — FAMOTIDINE IN NACL 20-0.9 MG/50ML-% IV SOLN
20.0000 mg | Freq: Once | INTRAVENOUS | Status: AC
  Administered 2023-02-03: 20 mg via INTRAVENOUS
  Filled 2023-02-03: qty 50

## 2023-02-03 NOTE — Patient Instructions (Signed)

## 2023-02-03 NOTE — Progress Notes (Signed)
Treatment given today per MD orders.  Stable during infusion without adverse affects.  Vital signs stable.  No complaints at this time.  Discharge from clinic ambulatory in stable condition.  Alert and oriented X 3.  Follow up with [facility] as scheduled.  

## 2023-02-03 NOTE — Progress Notes (Signed)
Patient presents today for treatment and follow up visit with Dr. Ellin Saba. Labs and vital signs within parameters for treatment.   Message received from Dr. Ellin Saba / A.Anderson RN to proceed with treatment.

## 2023-02-03 NOTE — Progress Notes (Signed)
Patients port flushed without difficulty.  Good blood return noted with no bruising or swelling noted at site.  Patient remains accessed for treatment.  

## 2023-02-03 NOTE — Patient Instructions (Signed)
[facility]  Discharge Instructions: Thank you for choosing [facility] to provide your oncology and hematology care.  If you have a lab appointment with the Cancer Center - please note that after April 8th, 2024, all labs will be drawn in the cancer center.  You do not have to check in or register with the main entrance as you have in the past but will complete your check-in in the cancer center.  Wear comfortable clothing and clothing appropriate for easy access to any Portacath or PICC line.   We strive to give you quality time with your provider. You may need to reschedule your appointment if you arrive late (15 or more minutes).  Arriving late affects you and other patients whose appointments are after yours.  Also, if you miss three or more appointments without notifying the office, you may be dismissed from the clinic at the provider's discretion.      For prescription refill requests, have your pharmacy contact our office and allow 72 hours for refills to be completed.    Today you received the following chemotherapy and/or immunotherapy agents treatment      To help prevent nausea and vomiting after your treatment, we encourage you to take your nausea medication as directed.  BELOW ARE SYMPTOMS THAT SHOULD BE REPORTED IMMEDIATELY: *FEVER GREATER THAN 100.4 F (38 C) OR HIGHER *CHILLS OR SWEATING *NAUSEA AND VOMITING THAT IS NOT CONTROLLED WITH YOUR NAUSEA MEDICATION *UNUSUAL SHORTNESS OF BREATH *UNUSUAL BRUISING OR BLEEDING *URINARY PROBLEMS (pain or burning when urinating, or frequent urination) *BOWEL PROBLEMS (unusual diarrhea, constipation, pain near the anus) TENDERNESS IN MOUTH AND THROAT WITH OR WITHOUT PRESENCE OF ULCERS (sore throat, sores in mouth, or a toothache) UNUSUAL RASH, SWELLING OR PAIN  UNUSUAL VAGINAL DISCHARGE OR ITCHING   Items with * indicate a potential emergency and should be followed up as soon as possible or go to the  Emergency Department if any problems should occur.  Please show the CHEMOTHERAPY ALERT CARD or IMMUNOTHERAPY ALERT CARD at check-in to the Emergency Department and triage nurse.  Should you have questions after your visit or need to cancel or reschedule your appointment, please contact [facility] [phone]  and follow the prompts.  Office hours are 8:00 a.m. to 4:30 p.m. Monday - Friday. Please note that voicemails left after 4:00 p.m. may not be returned until the following business day.  We are closed weekends and major holidays. You have access to a nurse at all times for urgent questions. Please call the main number to the clinic [phone] and follow the prompts.  For any non-urgent questions, you may also contact your provider using MyChart. We now offer e-Visits for anyone 5 and older to request care online for non-urgent symptoms. For details visit [email or web address].   Also download the MyChart app! Go to the app store, search "MyChart", open the app, select [place], and log in with your MyChart username and password.

## 2023-02-03 NOTE — Progress Notes (Signed)
Patient has been examined by Dr. Katragadda. Vital signs and labs have been reviewed by MD - ANC, Creatinine, LFTs, hemoglobin, and platelets are within treatment parameters per M.D. - pt may proceed with treatment.  Primary RN and pharmacy notified.  

## 2023-02-05 ENCOUNTER — Inpatient Hospital Stay: Payer: 59

## 2023-02-05 VITALS — BP 114/70 | HR 66 | Temp 97.9°F | Resp 18 | Ht 66.0 in | Wt 177.0 lb

## 2023-02-05 DIAGNOSIS — Z5111 Encounter for antineoplastic chemotherapy: Secondary | ICD-10-CM | POA: Diagnosis not present

## 2023-02-05 DIAGNOSIS — R079 Chest pain, unspecified: Secondary | ICD-10-CM | POA: Diagnosis not present

## 2023-02-05 DIAGNOSIS — R112 Nausea with vomiting, unspecified: Secondary | ICD-10-CM | POA: Diagnosis not present

## 2023-02-05 DIAGNOSIS — Z809 Family history of malignant neoplasm, unspecified: Secondary | ICD-10-CM | POA: Diagnosis not present

## 2023-02-05 DIAGNOSIS — C3492 Malignant neoplasm of unspecified part of left bronchus or lung: Secondary | ICD-10-CM

## 2023-02-05 DIAGNOSIS — Z5112 Encounter for antineoplastic immunotherapy: Secondary | ICD-10-CM | POA: Diagnosis not present

## 2023-02-05 DIAGNOSIS — C3412 Malignant neoplasm of upper lobe, left bronchus or lung: Secondary | ICD-10-CM | POA: Diagnosis not present

## 2023-02-05 DIAGNOSIS — Z5189 Encounter for other specified aftercare: Secondary | ICD-10-CM | POA: Diagnosis not present

## 2023-02-05 DIAGNOSIS — F17210 Nicotine dependence, cigarettes, uncomplicated: Secondary | ICD-10-CM | POA: Diagnosis not present

## 2023-02-05 DIAGNOSIS — I10 Essential (primary) hypertension: Secondary | ICD-10-CM | POA: Diagnosis not present

## 2023-02-05 MED ORDER — PEGFILGRASTIM-JMDB 6 MG/0.6ML ~~LOC~~ SOSY
6.0000 mg | PREFILLED_SYRINGE | Freq: Once | SUBCUTANEOUS | Status: AC
  Administered 2023-02-05: 6 mg via SUBCUTANEOUS
  Filled 2023-02-05: qty 0.6

## 2023-02-05 NOTE — Progress Notes (Signed)
Patient here for Fulphila injection today.  No complaints noted. Injection given in right arm without incident.  Tolerated well and band aid applied.  Patient discharged ambulatory with spouse in stable condition.

## 2023-02-09 ENCOUNTER — Ambulatory Visit
Admission: RE | Admit: 2023-02-09 | Discharge: 2023-02-09 | Disposition: A | Discharge status: Home / Self Care | Payer: 59 | Source: Ambulatory Visit

## 2023-02-09 NOTE — Progress Notes (Signed)
  Radiation Oncology         [phone] ________________________________  Name: Rodney Mahoney MRN: [id number]  Date of Service: 02/09/2023  DOB: 1960/09/10  Post Treatment Telephone Note  Diagnosis:  C34.12 Malignant neoplasm of upper lobe, left bronchus or lung (as documented in provider EOT note)   The patient was available for call today.   Symptoms of fatigue have improved since completing therapy.  Symptoms of skin changes have improved since completing therapy.  Symptoms of esophagitis have improved since completing therapy.  The patient has scheduled follow up with his medical oncologist Dr. Ellin Saba for ongoing care, and was encouraged to call if he  develops concerns or questions regarding radiation.   This concludes the interaction.  Ruel Favors, LPN

## 2023-02-10 ENCOUNTER — Telehealth: Payer: Self-pay | Admitting: *Deleted

## 2023-02-10 NOTE — Telephone Encounter (Signed)
Opened in error

## 2023-02-23 NOTE — Progress Notes (Signed)
[facility] [address], [place] [address]    Clinic Day:  02/24/2023  Referring physician: [name], MD  Patient Care Team: Billie Lade, MD as PCP - General (Internal Medicine) [name], MD as PCP - Cardiology (Cardiology) [name], MD as Consulting Physician (Internal Medicine) [name], RN as Oncology Nurse Navigator (Medical Oncology) [name], MD as Medical Oncologist (Medical Oncology)   ASSESSMENT & PLAN:   Assessment: 1.  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  No weight loss or hemoptysis. - CT angio chest (11/30/2022): Left upper lobe mass with central necrosis and bulky mediastinal, left hilar and left lower neck adenopathy and lytic lesion in the right superior sternum. - Bronchoscopy (12/01/2022): Narrowing of LUL apical and posterior segment.  Otherwise normal-appearing bronchial trees. - LUL upper lobe biopsy (12/01/2022): Invasive moderately differentiated squamous cell carcinoma. - PET scan (12/16/2022): Left supraclavicular 3.2 cm mass, direct extension from mediastinal adenopathy.  Small right supraclavicular lymph node.  Large left lung mass 7.4 x 6.1 cm extending into the left hilum and AP window.  Prevascular and paratracheal lymph nodes and right paratracheal node.  Extensive involvement of sternum manubrium and first rib close to manubrium.  Soft tissue extension into the medial border of the overlying pectoralis muscle.  Discrete hypermetabolic right upper lobe nodule 10 mm.  No abdominal or pelvic metastatic disease. - He has received 1 dose of concurrent chemo XRT on 12/23/2022. - Left lung XRT 12 Gray in 4 fractions and left bronchus radiation 18 Gray in 6 fractions completed - [employer] (01/07/2023): PIK3CA, T p53, MSI high not detected. - NGS (01/03/2023): TMB-high.  MSI-stable.  PIK3CA and T  p53 mutations present.  No other targetable mutations.  PD-L1 TPS 0%.  HER2 IHC 0. - Carboplatin, paclitaxel, durvalumab and tremelimumab cycle 1 started on 01/13/2023   2.  Social/family history: - Lives at home by himself.  His wife helps him but they are separated.  He is independent of ADLs and IADLs.  Retired from work in April 2024.  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  Current active smoker, 1 pack/day started at age 40 and quit at the time of diagnosis. - Sister had cancer.  2 paternal cousins had cancer.    Plan: 1.  Stage IV (T4 N2 M1) squamous cell lung cancer: - He received cycle 2 of chemoimmunotherapy on 02/02/2023. - No GI symptoms reported. - Had some tingling on and off in the fingertips and toes.  Decreased appetite and decreased taste reported. - Reviewed labs today: Normal LFTs.  CBC grossly normal. - Proceed with cycle 3 today.  RTC 3 weeks with repeat CT CAP with contrast.   2.  Chest and neck tightness/pain: - Pain has improved.  He only required Norco 7.5 mg twice in the last 3 weeks.   3.  Hypertension: - Lisinopril and Toprol-XL on hold. - Blood pressure today is 140/85 and pulse rate is 102. - We will restart Toprol-XL 25 mg daily.   4.  Nausea/vomiting: - Continue Zyprexa 5 mg daily. - Use Compazine 10 mg every 6 hours as needed.    Orders Placed This Encounter  Procedures   CT CHEST ABDOMEN PELVIS W CONTRAST    Standing Status:   Future    Standing Expiration Date:   02/24/2024    Order Specific Question:  If indicated for the ordered procedure, I authorize the administration of contrast media per Radiology protocol    Answer:   Yes    Order Specific Question:   Does the patient have a contrast media/X-ray dye allergy?    Answer:   No    Order Specific Question:   Preferred imaging location?    Answer:   [facility]    Order Specific Question:   If indicated for the ordered procedure, I authorize the administration of oral  contrast media per Radiology protocol    Answer:   Yes      I,Katie Daubenspeck,acting as a scribe for [name], MD.,have documented all relevant documentation on the behalf of [name], MD,as directed by  [name], MD while in the presence of [name], MD.   I, [name] MD, have reviewed the above documentation for accuracy and completeness, and I agree with the above.   [name], MD   9/11/20246:06 PM  CHIEF COMPLAINT:   Diagnosis: metastatic squamous cell lung cancer    Cancer Staging  Squamous cell lung cancer, left (HCC) Staging form: Lung, AJCC 8th Edition - Clinical stage from 12/22/2022: Stage IVA (cT4, cN3, cM1a) - Unsigned    Prior Therapy: radiation therapy 12/15/22 - 12/29/22, with one dose carboplatin/taxol on 12/25/22   Current Therapy:  carboplatin/paclitaxel, durvalumab, tremelimumab     HISTORY OF PRESENT ILLNESS:   Oncology History  Squamous cell lung cancer, left (HCC)  12/22/2022 Initial Diagnosis   Squamous cell lung cancer, left (HCC)   12/25/2022 - 12/25/2022 Chemotherapy   Patient is on Treatment Plan : ESOPHAGUS Carboplatin + Paclitaxel Weekly X 6 Weeks with XRT     01/13/2023 -  Chemotherapy   Patient is on Treatment Plan : LUNG NSCLC Squamous Tremelimumab-actl (cycles 1,2,3,4,6) + Durvalumab + PAClitaxel + Carboplatin q21 days x 4 cycles / Durvalumab q28 days        INTERVAL HISTORY:   Akhir is a 62 y.o. male presenting to clinic today for follow up of metastatic squamous cell lung cancer. He was last seen by me on 02/03/23.  Today, he states that he is doing well overall. His appetite level is at 65%. His energy level is at 520%.  PAST MEDICAL HISTORY:   Past Medical History: Past Medical History:  Diagnosis Date   Bronchitis    CAD (coronary artery disease)    a. cath 11/26/2014 EF 45%, moderate to severe inferior hypokinesis, 90% small D1, 100% mid to distal RCA with minimal  collateral, 40% mid to distal LCx. Medical therapy as RCA occlusion appears to be completed event; DES to mid circumflex October 2019   Essential hypertension    Hyperlipidemia    Ischemic cardiomyopathy    NSTEMI (non-ST elevated myocardial infarction) (HCC)    11/26/14, 01/31/18   Squamous cell lung cancer, left [facility])     Surgical History: Past Surgical History:  Procedure Laterality Date   BRONCHIAL BIOPSY  12/01/2022   Procedure: BRONCHIAL BIOPSIES;  Surgeon: Martina Sinner, MD;  Location: [facility] ENDOSCOPY;  Service: Pulmonary;;   BRONCHIAL BRUSHINGS  12/01/2022   Procedure: BRONCHIAL BRUSHINGS;  Surgeon: Martina Sinner, MD;  Location: [facility] ENDOSCOPY;  Service: Pulmonary;;   BRONCHIAL WASHINGS  12/01/2022   Procedure: BRONCHIAL WASHINGS;  Surgeon: Martina Sinner, MD;  Location: [facility] ENDOSCOPY;  Service: Pulmonary;;   CARDIAC CATHETERIZATION N/A 11/26/2014   Procedure: Left Heart Cath and Coronary Angiography;  Surgeon: Peter M Swaziland, MD;  Location:  [facility] INVASIVE CV LAB;  Service: Cardiovascular;  Laterality: N/A;   COLONOSCOPY     per patient: done in [place], age 57, couple of polyps.   COLONOSCOPY WITH PROPOFOL N/A 10/08/2022   Procedure: COLONOSCOPY WITH PROPOFOL;  Surgeon: Lanelle Bal, DO;  Location: [facility] ENDO SUITE;  Service: Endoscopy;  Laterality: N/A;  11:00 am   CORONARY STENT INTERVENTION N/A 01/31/2018   Procedure: CORONARY STENT INTERVENTION;  Surgeon: Runell Gess, MD;  Location: [facility] INVASIVE CV LAB;  Service: Cardiovascular;  Laterality: N/A;   HEMOSTASIS CONTROL  12/01/2022   Procedure: HEMOSTASIS CONTROL;  Surgeon: Martina Sinner, MD;  Location: [facility] ENDOSCOPY;  Service: Pulmonary;;  cold saline   IR IMAGING GUIDED PORT INSERTION  12/23/2022   LEFT HEART CATH AND CORONARY ANGIOGRAPHY N/A 01/31/2018   Procedure: LEFT HEART CATH AND CORONARY ANGIOGRAPHY;  Surgeon: Runell Gess, MD;  Location: [facility] INVASIVE CV LAB;  Service: Cardiovascular;  Laterality: N/A;   NECK  SURGERY  2005   VIDEO BRONCHOSCOPY N/A 12/01/2022   Procedure: VIDEO BRONCHOSCOPY WITH FLUORO;  Surgeon: Martina Sinner, MD;  Location: [facility] ENDOSCOPY;  Service: Pulmonary;  Laterality: N/A;    Social History: Social History   Socioeconomic History   Marital status: Married    Spouse name: Not on file   Number of children: Not on file   Years of education: Not on file   Highest education level: Not on file  Occupational History   Not on file  Tobacco Use   Smoking status: Every Day    Current packs/day: 1.00    Average packs/day: 1 pack/day for 42.0 years (42.0 ttl pk-yrs)    Types: Cigarettes   Smokeless tobacco: Never   Tobacco comments:    He used to quit and start back. He has not quit for the past 5 years.   Vaping Use   Vaping status: Never Used  Substance and Sexual Activity   Alcohol use: Yes    Comment: 10 cans of beer   Drug use: Yes    Types: Marijuana    Comment: smokes week here and there   Sexual activity: Yes    Birth control/protection: None  Other Topics Concern   Not on file  Social History Narrative   Works at [employer] and live with wife.    Social Determinants of Health   Financial Resource Strain: High Risk (12/31/2022)   Overall Financial Resource Strain (CARDIA)    Difficulty of Paying Living Expenses: Very hard  Food Insecurity: No Food Insecurity (01/20/2023)   Hunger Vital Sign    Worried About Running Out of Food in the Last Year: Never true    Ran Out of Food in the Last Year: Never true  Transportation Needs: No Transportation Needs (01/20/2023)   PRAPARE - Administrator, Civil Service (Medical): No    Lack of Transportation (Non-Medical): No  Physical Activity: Not on file  Stress: Not on file  Social Connections: Not on file  Intimate Partner Violence: Not At Risk (01/20/2023)   Humiliation, Afraid, Rape, and Kick questionnaire    Fear of Current or Ex-Partner: No    Emotionally Abused: No    Physically Abused: No     Sexually Abused: No    Family History: Family History  Problem Relation Age of Onset   Diabetes Mother    Heart attack Father    Cancer Sister    Hypertension Brother    Colon cancer Neg Hx  Prostate cancer Neg Hx    Lung cancer Neg Hx     Current Medications:  Current Outpatient Medications:    acetaminophen (TYLENOL) 500 MG tablet, Take 500 mg by mouth every 6 (six) hours as needed for mild pain., Disp: , Rfl:    albuterol (VENTOLIN HFA) 108 (90 Base) MCG/ACT inhaler, Inhale 2 puffs into the lungs every 6 (six) hours as needed for wheezing or shortness of breath., Disp: 18 g, Rfl: 2   aluminum-magnesium hydroxide 200-200 MG/5ML suspension, Take 10 mLs by mouth every 6 (six) hours as needed for indigestion., Disp: 480 mL, Rfl: 2   aspirin EC 81 MG tablet, Take 1 tablet (81 mg total) by mouth daily. Swallow whole., Disp: 90 tablet, Rfl: 3   CARBOPLATIN IV, Inject into the vein once a week., Disp: , Rfl:    empagliflozin (JARDIANCE) 10 MG TABS tablet, Take 1 tablet (10 mg total) by mouth daily before breakfast., Disp: 30 tablet, Rfl: 3   ezetimibe (ZETIA) 10 MG tablet, Take 1 tablet by mouth once daily, Disp: 90 tablet, Rfl: 0   HYDROcodone-acetaminophen (NORCO) 7.5-325 MG tablet, Take 1 tablet by mouth every 6 (six) hours as needed for moderate pain., Disp: 120 tablet, Rfl: 0   lidocaine-prilocaine (EMLA) cream, Apply to affected area once, Disp: 30 g, Rfl: 3   lisinopril (ZESTRIL) 10 MG tablet, Take 1 tablet by mouth once daily, Disp: 30 tablet, Rfl: 0   Menthol, Topical Analgesic, (BIOFREEZE EX), Apply 1 Application topically daily as needed (pain)., Disp: , Rfl:    nitroGLYCERIN (NITROSTAT) 0.4 MG SL tablet, Place 1 tablet (0.4 mg total) under the tongue every 5 (five) minutes as needed., Disp: 25 tablet, Rfl: 0   OLANZapine (ZYPREXA) 5 MG tablet, Take 1 tablet (5 mg total) by mouth at bedtime., Disp: 30 tablet, Rfl: 1   PACLITAXEL IV, Inject into the vein once a week., Disp:  , Rfl:    prochlorperazine (COMPAZINE) 10 MG tablet, Take 1 tablet (10 mg total) by mouth every 6 (six) hours as needed for nausea or vomiting., Disp: 60 tablet, Rfl: 3   atorvastatin (LIPITOR) 80 MG tablet, Take 1 tablet (80 mg total) by mouth daily. TAKE 1 TABLET BY MOUTH EVERY DAY AT 6 PM (Patient taking differently: Take 80 mg by mouth at bedtime.), Disp: 90 tablet, Rfl: 1   metoprolol succinate (TOPROL-XL) 25 MG 24 hr tablet, Take 1 tablet (25 mg total) by mouth daily., Disp: 90 tablet, Rfl: 3 No current facility-administered medications for this visit.  Facility-Administered Medications Ordered in Other Visits:    sodium chloride flush (NS) 0.9 % injection 10 mL, 10 mL, Intracatheter, PRN, [name], MD, 10 mL at 02/24/23 1610   Allergies: No Known Allergies  REVIEW OF SYSTEMS:   Review of Systems  Constitutional:  Negative for chills, fatigue and fever.  HENT:   Negative for lump/mass, mouth sores, nosebleeds, sore throat and trouble swallowing.   Eyes:  Negative for eye problems.  Respiratory:  Positive for shortness of breath. Negative for cough.   Cardiovascular:  Negative for chest pain, leg swelling and palpitations.  Gastrointestinal:  Negative for abdominal pain, constipation, diarrhea, nausea and vomiting.  Genitourinary:  Negative for bladder incontinence, difficulty urinating, dysuria, frequency, hematuria and nocturia.   Musculoskeletal:  Negative for arthralgias, back pain, flank pain, myalgias and neck pain.  Skin:  Negative for itching and rash.  Neurological:  Positive for dizziness and numbness. Negative for headaches.  Hematological:  Does not bruise/bleed  easily.  Psychiatric/Behavioral:  Positive for sleep disturbance. Negative for depression and suicidal ideas. The patient is not nervous/anxious.   All other systems reviewed and are negative.    VITALS:   There were no vitals taken for this visit.  Wt Readings from Last 3 Encounters:  02/24/23  174 lb 1.6 oz (79 kg)  02/05/23 177 lb (80.3 kg)  02/03/23 176 lb 8 oz (80.1 kg)    There is no height or weight on file to calculate BMI.  Performance status (ECOG): 1 - Symptomatic but completely ambulatory  PHYSICAL EXAM:   Physical Exam Vitals and nursing note reviewed. Exam conducted with a chaperone present.  Constitutional:      Appearance: Normal appearance.  Cardiovascular:     Rate and Rhythm: Normal rate and regular rhythm.     Pulses: Normal pulses.     Heart sounds: Normal heart sounds.  Pulmonary:     Effort: Pulmonary effort is normal.     Breath sounds: Normal breath sounds.  Abdominal:     Palpations: Abdomen is soft. There is no hepatomegaly, splenomegaly or mass.     Tenderness: There is no abdominal tenderness.  Musculoskeletal:     Right lower leg: No edema.     Left lower leg: No edema.  Lymphadenopathy:     Cervical: No cervical adenopathy.     Right cervical: No superficial, deep or posterior cervical adenopathy.    Left cervical: No superficial, deep or posterior cervical adenopathy.     Upper Body:     Right upper body: No supraclavicular or axillary adenopathy.     Left upper body: No supraclavicular or axillary adenopathy.  Neurological:     General: No focal deficit present.     Mental Status: He is alert and oriented to person, place, and time.  Psychiatric:        Mood and Affect: Mood normal.        Behavior: Behavior normal.     LABS:      Latest Ref Rng & Units 02/24/2023    8:03 AM 02/03/2023    7:50 AM 01/21/2023    4:55 AM  CBC  WBC 4.0 - 10.5 K/uL 8.1  6.9  15.1   Hemoglobin 13.0 - 17.0 g/dL 60.1  09.3  23.5   Hematocrit 39.0 - 52.0 % 35.9  33.6  32.9   Platelets 150 - 400 K/uL 227  249  180       Latest Ref Rng & Units 02/24/2023    8:03 AM 02/03/2023    7:50 AM 01/21/2023    4:55 AM  CMP  Glucose 70 - 99 mg/dL 573  220  93   BUN 8 - 23 mg/dL 5  8  9    Creatinine 0.61 - 1.24 mg/dL 2.54  2.70  6.23   Sodium 135 - 145  mmol/L 138  137  136   Potassium 3.5 - 5.1 mmol/L 3.5  3.8  4.1   Chloride 98 - 111 mmol/L 102  104  102   CO2 22 - 32 mmol/L 27  27  24    Calcium 8.9 - 10.3 mg/dL 8.9  8.7  9.0   Total Protein 6.5 - 8.1 g/dL 6.9  6.3    Total Bilirubin 0.3 - 1.2 mg/dL 0.6  0.7    Alkaline Phos 38 - 126 U/L 104  88    AST 15 - 41 U/L 18  21    ALT 0 - 44 U/L  22  32       No results found for: "CEA1", "CEA" / No results found for: "CEA1", "CEA" Lab Results  Component Value Date   PSA1 0.6 07/31/2021   No results found for: "UJW119" No results found for: "CAN125"  No results found for: "TOTALPROTELP", "ALBUMINELP", "A1GS", "A2GS", "BETS", "BETA2SER", "GAMS", "MSPIKE", "SPEI" No results found for: "TIBC", "FERRITIN", "IRONPCTSAT" No results found for: "LDH"   STUDIES:   No results found.

## 2023-02-24 ENCOUNTER — Other Ambulatory Visit: Payer: Self-pay

## 2023-02-24 ENCOUNTER — Inpatient Hospital Stay (HOSPITAL_BASED_OUTPATIENT_CLINIC_OR_DEPARTMENT_OTHER): Payer: 59 | Admitting: Hematology

## 2023-02-24 ENCOUNTER — Inpatient Hospital Stay: Payer: 59

## 2023-02-24 ENCOUNTER — Inpatient Hospital Stay: Payer: 59 | Attending: Hematology

## 2023-02-24 VITALS — BP 129/71 | HR 98 | Temp 97.0°F | Resp 20

## 2023-02-24 DIAGNOSIS — Z5112 Encounter for antineoplastic immunotherapy: Secondary | ICD-10-CM | POA: Insufficient documentation

## 2023-02-24 DIAGNOSIS — R112 Nausea with vomiting, unspecified: Secondary | ICD-10-CM | POA: Insufficient documentation

## 2023-02-24 DIAGNOSIS — C3492 Malignant neoplasm of unspecified part of left bronchus or lung: Secondary | ICD-10-CM

## 2023-02-24 DIAGNOSIS — F17210 Nicotine dependence, cigarettes, uncomplicated: Secondary | ICD-10-CM | POA: Insufficient documentation

## 2023-02-24 DIAGNOSIS — Z5189 Encounter for other specified aftercare: Secondary | ICD-10-CM | POA: Diagnosis not present

## 2023-02-24 DIAGNOSIS — Z5111 Encounter for antineoplastic chemotherapy: Secondary | ICD-10-CM | POA: Diagnosis not present

## 2023-02-24 DIAGNOSIS — I10 Essential (primary) hypertension: Secondary | ICD-10-CM | POA: Insufficient documentation

## 2023-02-24 DIAGNOSIS — C3412 Malignant neoplasm of upper lobe, left bronchus or lung: Secondary | ICD-10-CM | POA: Insufficient documentation

## 2023-02-24 LAB — CBC WITH DIFFERENTIAL/PLATELET
Abs Immature Granulocytes: 0.02 10*3/uL (ref 0.00–0.07)
Basophils Absolute: 0.1 10*3/uL (ref 0.0–0.1)
Basophils Relative: 1 %
Eosinophils Absolute: 0.3 10*3/uL (ref 0.0–0.5)
Eosinophils Relative: 3 %
HCT: 35.9 % — ABNORMAL LOW (ref 39.0–52.0)
Hemoglobin: 11.4 g/dL — ABNORMAL LOW (ref 13.0–17.0)
Immature Granulocytes: 0 %
Lymphocytes Relative: 27 %
Lymphs Abs: 2.2 10*3/uL (ref 0.7–4.0)
MCH: 29.2 pg (ref 26.0–34.0)
MCHC: 31.8 g/dL (ref 30.0–36.0)
MCV: 92.1 fL (ref 80.0–100.0)
Monocytes Absolute: 1 10*3/uL (ref 0.1–1.0)
Monocytes Relative: 12 %
Neutro Abs: 4.7 10*3/uL (ref 1.7–7.7)
Neutrophils Relative %: 57 %
Platelets: 227 10*3/uL (ref 150–400)
RBC: 3.9 MIL/uL — ABNORMAL LOW (ref 4.22–5.81)
RDW: 21.1 % — ABNORMAL HIGH (ref 11.5–15.5)
WBC: 8.1 10*3/uL (ref 4.0–10.5)
nRBC: 0 % (ref 0.0–0.2)

## 2023-02-24 LAB — COMPREHENSIVE METABOLIC PANEL
ALT: 22 U/L (ref 0–44)
AST: 18 U/L (ref 15–41)
Albumin: 3.5 g/dL (ref 3.5–5.0)
Alkaline Phosphatase: 104 U/L (ref 38–126)
Anion gap: 9 (ref 5–15)
BUN: 5 mg/dL — ABNORMAL LOW (ref 8–23)
CO2: 27 mmol/L (ref 22–32)
Calcium: 8.9 mg/dL (ref 8.9–10.3)
Chloride: 102 mmol/L (ref 98–111)
Creatinine, Ser: 0.57 mg/dL — ABNORMAL LOW (ref 0.61–1.24)
GFR, Estimated: >60 mL/min (ref >60)
Glucose, Bld: 101 mg/dL — ABNORMAL HIGH (ref 70–99)
Potassium: 3.5 mmol/L (ref 3.5–5.1)
Sodium: 138 mmol/L (ref 135–145)
Total Bilirubin: 0.6 mg/dL (ref 0.3–1.2)
Total Protein: 6.9 g/dL (ref 6.5–8.1)

## 2023-02-24 LAB — MAGNESIUM: Magnesium: 1.8 mg/dL (ref 1.7–2.4)

## 2023-02-24 MED ORDER — HEPARIN SOD (PORK) LOCK FLUSH 100 UNIT/ML IV SOLN
500.0000 [IU] | Freq: Once | INTRAVENOUS | Status: AC | PRN
  Administered 2023-02-24: 500 [IU]

## 2023-02-24 MED ORDER — FAMOTIDINE IN NACL 20-0.9 MG/50ML-% IV SOLN
20.0000 mg | Freq: Once | INTRAVENOUS | Status: AC
  Administered 2023-02-24: 20 mg via INTRAVENOUS
  Filled 2023-02-24: qty 50

## 2023-02-24 MED ORDER — SODIUM CHLORIDE 0.9 % IV SOLN
664.5000 mg | Freq: Once | INTRAVENOUS | Status: AC
  Administered 2023-02-24: 660 mg via INTRAVENOUS
  Filled 2023-02-24: qty 66

## 2023-02-24 MED ORDER — PALONOSETRON HCL INJECTION 0.25 MG/5ML
0.2500 mg | Freq: Once | INTRAVENOUS | Status: AC
  Administered 2023-02-24: 0.25 mg via INTRAVENOUS
  Filled 2023-02-24: qty 5

## 2023-02-24 MED ORDER — METOPROLOL SUCCINATE ER 25 MG PO TB24: 25.0000 mg | ORAL_TABLET | Freq: Every day | ORAL | 3 refills | Status: DC

## 2023-02-24 MED ORDER — TREMELIMUMAB-ACTL CHEMO INJECTION 25 MG/1.25ML
75.0000 mg | Freq: Once | INTRAVENOUS | Status: AC
  Administered 2023-02-24: 75 mg via INTRAVENOUS
  Filled 2023-02-24: qty 3.75

## 2023-02-24 MED ORDER — CETIRIZINE HCL 10 MG/ML IV SOLN
10.0000 mg | Freq: Once | INTRAVENOUS | Status: AC
  Administered 2023-02-24: 10 mg via INTRAVENOUS
  Filled 2023-02-24: qty 1

## 2023-02-24 MED ORDER — SODIUM CHLORIDE 0.9 % IV SOLN
175.0000 mg/m2 | Freq: Once | INTRAVENOUS | Status: AC
  Administered 2023-02-24: 336 mg via INTRAVENOUS
  Filled 2023-02-24: qty 56

## 2023-02-24 MED ORDER — SODIUM CHLORIDE 0.9 % IV SOLN: Freq: Once | INTRAVENOUS | Status: AC

## 2023-02-24 MED ORDER — SODIUM CHLORIDE 0.9 % IV SOLN
150.0000 mg | Freq: Once | INTRAVENOUS | Status: AC
  Administered 2023-02-24: 150 mg via INTRAVENOUS
  Filled 2023-02-24: qty 150

## 2023-02-24 MED ORDER — SODIUM CHLORIDE 0.9 % IV SOLN
1500.0000 mg | Freq: Once | INTRAVENOUS | Status: AC
  Administered 2023-02-24: 1500 mg via INTRAVENOUS
  Filled 2023-02-24: qty 30

## 2023-02-24 MED ORDER — SODIUM CHLORIDE 0.9% FLUSH
10.0000 mL | INTRAVENOUS | Status: DC | PRN
  Administered 2023-02-24: 10 mL via INTRAVENOUS

## 2023-02-24 MED ORDER — SODIUM CHLORIDE 0.9 % IV SOLN
20.0000 mg | Freq: Once | INTRAVENOUS | Status: AC
  Administered 2023-02-24: 20 mg via INTRAVENOUS
  Filled 2023-02-24: qty 20

## 2023-02-24 MED ORDER — SODIUM CHLORIDE 0.9% FLUSH
10.0000 mL | INTRAVENOUS | Status: DC | PRN
  Administered 2023-02-24: 10 mL

## 2023-02-24 NOTE — Patient Instructions (Signed)
[facility]  Discharge Instructions: Thank you for choosing [facility] to provide your oncology and hematology care.  If you have a lab appointment with the Cancer Center - please note that after April 8th, 2024, all labs will be drawn in the cancer center.  You do not have to check in or register with the main entrance as you have in the past but will complete your check-in in the cancer center.  Wear comfortable clothing and clothing appropriate for easy access to any Portacath or PICC line.   We strive to give you quality time with your provider. You may need to reschedule your appointment if you arrive late (15 or more minutes).  Arriving late affects you and other patients whose appointments are after yours.  Also, if you miss three or more appointments without notifying the office, you may be dismissed from the clinic at the provider's discretion.      For prescription refill requests, have your pharmacy contact our office and allow 72 hours for refills to be completed.    Today you received the following chemotherapy and/or immunotherapy agents Imjudo Imfinzi Taxol Carboplatin      To help prevent nausea and vomiting after your treatment, we encourage you to take your nausea medication as directed.  BELOW ARE SYMPTOMS THAT SHOULD BE REPORTED IMMEDIATELY: *FEVER GREATER THAN 100.4 F (38 C) OR HIGHER *CHILLS OR SWEATING *NAUSEA AND VOMITING THAT IS NOT CONTROLLED WITH YOUR NAUSEA MEDICATION *UNUSUAL SHORTNESS OF BREATH *UNUSUAL BRUISING OR BLEEDING *URINARY PROBLEMS (pain or burning when urinating, or frequent urination) *BOWEL PROBLEMS (unusual diarrhea, constipation, pain near the anus) TENDERNESS IN MOUTH AND THROAT WITH OR WITHOUT PRESENCE OF ULCERS (sore throat, sores in mouth, or a toothache) UNUSUAL RASH, SWELLING OR PAIN  UNUSUAL VAGINAL DISCHARGE OR ITCHING   Items with * indicate a potential emergency and should be followed up as soon as  possible or go to the Emergency Department if any problems should occur.  Please show the CHEMOTHERAPY ALERT CARD or IMMUNOTHERAPY ALERT CARD at check-in to the Emergency Department and triage nurse.  Should you have questions after your visit or need to cancel or reschedule your appointment, please contact [facility] [phone]  and follow the prompts.  Office hours are 8:00 a.m. to 4:30 p.m. Monday - Friday. Please note that voicemails left after 4:00 p.m. may not be returned until the following business day.  We are closed weekends and major holidays. You have access to a nurse at all times for urgent questions. Please call the main number to the clinic [phone] and follow the prompts.  For any non-urgent questions, you may also contact your provider using MyChart. We now offer e-Visits for anyone 72 and older to request care online for non-urgent symptoms. For details visit [email or web address].   Also download the MyChart app! Go to the app store, search "MyChart", open the app, select , and log in with your MyChart username and password.

## 2023-02-24 NOTE — Progress Notes (Signed)
Patient presents today for Imjudo/Imfinzi/Taxol/Carboplatin per providers order.  Vital signs and labs reviewed by MD.  Message received from Chapman Moss RN/Dr. Ellin Saba patient okay for treatment.  Treatment given today per MD orders.  Stable during infusion without adverse affects.  Vital signs stable.  No complaints at this time.  Discharge from clinic ambulatory in stable condition.  Alert and oriented X 3.  Follow up with [facility] as scheduled.

## 2023-02-24 NOTE — Patient Instructions (Signed)
[facility] Discharge Instructions   You were seen and examined today by Dr. Ellin Saba.  He reviewed the results of your lab work which are normal/stable.   We will proceed with your treatment today.   Dr. Kirtland Bouchard wants you to start back taking Toprol XL. We will send a prescription to your pharmacy.   Return as scheduled.    Thank you for choosing [facility] at [facility] to provide your oncology and hematology care.  To afford each patient quality time with our provider, please arrive at least 15 minutes before your scheduled appointment time.   If you have a lab appointment with the Cancer Center please come in thru the Main Entrance and check in at the main information desk.  You need to re-schedule your appointment should you arrive 10 or more minutes late.  We strive to give you quality time with our providers, and arriving late affects you and other patients whose appointments are after yours.  Also, if you no show three or more times for appointments you may be dismissed from the clinic at the providers discretion.     Again, thank you for choosing [facility].  Our hope is that these requests will decrease the amount of time that you wait before being seen by our physicians.       _____________________________________________________________  Should you have questions after your visit to [facility], please contact our office at [phone] and follow the prompts.  Our office hours are 8:00 a.m. and 4:30 p.m. Monday - Friday.  Please note that voicemails left after 4:00 p.m. may not be returned until the following business day.  We are closed weekends and major holidays.  You do have access to a nurse 24-7, just call the main number to the clinic [phone] and do not press any options, hold on the line and a nurse will answer the phone.    For prescription refill requests, have your pharmacy  contact our office and allow 72 hours.    Due to Covid, you will need to wear a mask upon entering the hospital. If you do not have a mask, a mask will be given to you at the Main Entrance upon arrival. For doctor visits, patients may have 1 support person age 9 or older with them. For treatment visits, patients can not have anyone with them due to social distancing guidelines and our immunocompromised population.

## 2023-02-26 ENCOUNTER — Inpatient Hospital Stay: Payer: 59

## 2023-02-26 VITALS — BP 131/75 | HR 95 | Temp 97.5°F | Resp 18

## 2023-02-26 DIAGNOSIS — Z5189 Encounter for other specified aftercare: Secondary | ICD-10-CM | POA: Diagnosis not present

## 2023-02-26 DIAGNOSIS — F17210 Nicotine dependence, cigarettes, uncomplicated: Secondary | ICD-10-CM | POA: Diagnosis not present

## 2023-02-26 DIAGNOSIS — I10 Essential (primary) hypertension: Secondary | ICD-10-CM | POA: Diagnosis not present

## 2023-02-26 DIAGNOSIS — R112 Nausea with vomiting, unspecified: Secondary | ICD-10-CM | POA: Diagnosis not present

## 2023-02-26 DIAGNOSIS — Z5112 Encounter for antineoplastic immunotherapy: Secondary | ICD-10-CM | POA: Diagnosis not present

## 2023-02-26 DIAGNOSIS — Z5111 Encounter for antineoplastic chemotherapy: Secondary | ICD-10-CM | POA: Diagnosis not present

## 2023-02-26 DIAGNOSIS — C3492 Malignant neoplasm of unspecified part of left bronchus or lung: Secondary | ICD-10-CM

## 2023-02-26 DIAGNOSIS — C3412 Malignant neoplasm of upper lobe, left bronchus or lung: Secondary | ICD-10-CM | POA: Diagnosis not present

## 2023-02-26 MED ORDER — PEGFILGRASTIM-JMDB 6 MG/0.6ML ~~LOC~~ SOSY
6.0000 mg | PREFILLED_SYRINGE | Freq: Once | SUBCUTANEOUS | Status: AC
  Administered 2023-02-26: 6 mg via SUBCUTANEOUS
  Filled 2023-02-26: qty 0.6

## 2023-02-26 NOTE — Patient Instructions (Signed)
[facility] [place]  Discharge Instructions: Thank you for choosing [facility] to provide your oncology and hematology care.  If you have a lab appointment with the Cancer Center - please note that after April 8th, 2024, all labs will be drawn in the cancer center.  You do not have to check in or register with the main entrance as you have in the past but will complete your check-in in the cancer center.  Wear comfortable clothing and clothing appropriate for easy access to any Portacath or PICC line.   We strive to give you quality time with your provider. You may need to reschedule your appointment if you arrive late (15 or more minutes).  Arriving late affects you and other patients whose appointments are after yours.  Also, if you miss three or more appointments without notifying the office, you may be dismissed from the clinic at the provider's discretion.      For prescription refill requests, have your pharmacy contact our office and allow 72 hours for refills to be completed.    Today you received the following fuphila injection   To help prevent nausea and vomiting after your treatment, we encourage you to take your nausea medication as directed.  BELOW ARE SYMPTOMS THAT SHOULD BE REPORTED IMMEDIATELY: *FEVER GREATER THAN 100.4 F (38 C) OR HIGHER *CHILLS OR SWEATING *NAUSEA AND VOMITING THAT IS NOT CONTROLLED WITH YOUR NAUSEA MEDICATION *UNUSUAL SHORTNESS OF BREATH *UNUSUAL BRUISING OR BLEEDING *URINARY PROBLEMS (pain or burning when urinating, or frequent urination) *BOWEL PROBLEMS (unusual diarrhea, constipation, pain near the anus) TENDERNESS IN MOUTH AND THROAT WITH OR WITHOUT PRESENCE OF ULCERS (sore throat, sores in mouth, or a toothache) UNUSUAL RASH, SWELLING OR PAIN  UNUSUAL VAGINAL DISCHARGE OR ITCHING   Items with * indicate a potential emergency and should be followed up as soon as possible or go to the Emergency Department if any problems  should occur.  Please show the CHEMOTHERAPY ALERT CARD or IMMUNOTHERAPY ALERT CARD at check-in to the Emergency Department and triage nurse.  Should you have questions after your visit or need to cancel or reschedule your appointment, please contact [facility] CENTER AT [facility] [phone]  and follow the prompts.  Office hours are 8:00 a.m. to 4:30 p.m. Monday - Friday. Please note that voicemails left after 4:00 p.m. may not be returned until the following business day.  We are closed weekends and major holidays. You have access to a nurse at all times for urgent questions. Please call the main number to the clinic [phone] and follow the prompts.  For any non-urgent questions, you may also contact your provider using MyChart. We now offer e-Visits for anyone 35 and older to request care online for non-urgent symptoms. For details visit [email or web address].   Also download the MyChart app! Go to the app store, search "MyChart", open the app, select [place], and log in with your MyChart username and password.

## 2023-02-26 NOTE — Progress Notes (Signed)
Fulphila injection given per orders. Patient tolerated it well without problems. Vitals stable and discharged home from clinic ambulatory. Follow up as scheduled.

## 2023-03-04 ENCOUNTER — Ambulatory Visit: Payer: 59 | Admitting: Internal Medicine

## 2023-03-08 ENCOUNTER — Encounter: Payer: Self-pay | Admitting: Internal Medicine

## 2023-03-09 ENCOUNTER — Ambulatory Visit (HOSPITAL_COMMUNITY): Admission: RE | Admit: 2023-03-09 | Payer: 59 | Source: Ambulatory Visit

## 2023-03-16 NOTE — Progress Notes (Signed)
[facility] [address] [place] [address]    Clinic Day:  03/17/2023  Referring physician: Billie Lade, MD  Patient Care Team: Billie Lade, MD as PCP - General (Internal Medicine) [name], MD as PCP - Cardiology (Cardiology) [name], MD as Consulting Physician (Internal Medicine) [name], RN as Oncology Nurse Navigator (Medical Oncology) [name], MD as Medical Oncologist (Medical Oncology)   ASSESSMENT & PLAN:   Assessment: 1.  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  No weight loss or hemoptysis. - CT angio chest (11/30/2022): Left upper lobe mass with central necrosis and bulky mediastinal, left hilar and left lower neck adenopathy and lytic lesion in the right superior sternum. - Bronchoscopy (12/01/2022): Narrowing of LUL apical and posterior segment.  Otherwise normal-appearing bronchial trees. - LUL upper lobe biopsy (12/01/2022): Invasive moderately differentiated squamous cell carcinoma. - PET scan (12/16/2022): Left supraclavicular 3.2 cm mass, direct extension from mediastinal adenopathy.  Small right supraclavicular lymph node.  Large left lung mass 7.4 x 6.1 cm extending into the left hilum and AP window.  Prevascular and paratracheal lymph nodes and right paratracheal node.  Extensive involvement of sternum manubrium and first rib close to manubrium.  Soft tissue extension into the medial border of the overlying pectoralis muscle.  Discrete hypermetabolic right upper lobe nodule 10 mm.  No abdominal or pelvic metastatic disease. - He has received 1 dose of concurrent chemo XRT on 12/23/2022. - Left lung XRT 12 Gray in 4 fractions and left bronchus radiation 18 Gray in 6 fractions completed - [employer] (01/07/2023): PIK3CA, T p53, MSI high not detected. - NGS (01/03/2023): TMB-high.  MSI-stable.  PIK3CA and T p53  mutations present.  No other targetable mutations.  PD-L1 TPS 0%.  HER2 IHC 0. - Carboplatin, paclitaxel, durvalumab and tremelimumab cycle 1 started on 01/13/2023   2.  Social/family history: - Lives at home by himself.  His wife helps him but they are separated.  He is independent of ADLs and IADLs.  Retired from work in April 2024.  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  Current active smoker, 1 pack/day started at age 65 and quit at the time of diagnosis. - Sister had cancer.  2 paternal cousins had cancer.    Plan: 1.  Stage IV (T4 N2 M1) squamous cell lung cancer: - He received 3 cycles of chemoimmunotherapy. - Did not have any major GI side effects.  However noted decreased appetite and taste.  He eats anyway. - Mild tingling in the fingertips and toes is stable. - He has missed his CT scan from last week. - Reviewed labs today: Normal LFTs.  Creatinine is normal.  CBC grossly normal. - Recommend proceeding with cycle 4 without any dose modifications.  We will reschedule CT scan prior to next visit.  Based on the CT scan findings I will see if we need to give 2 more cycles of chemotherapy.  If he has an excellent response we will continue with durvalumab maintenance.   2.  Chest and neck tightness/pain: - Pain has improved.  He does not require Norco on regular basis.   3.  Hypertension: - Continue Toprol-XL 25 mg daily.  Blood pressure today is 126/76.   4.  Nausea/vomiting: - Continue Zyprexa 5 mg daily.  Use Compazine as needed.    Orders Placed This Encounter  Procedures   CBC with Differential/Platelet   Comprehensive metabolic panel      I,Katie Daubenspeck,acting as a scribe for [name], MD.,have documented all relevant documentation on the behalf of [name], MD,as directed by  [name], MD while in the presence of [name], MD.   I, [name] MD, have reviewed the above documentation for  accuracy and completeness, and I agree with the above.   [name], MD   10/2/20244:02 PM  CHIEF COMPLAINT:   Diagnosis: metastatic squamous cell lung cancer    Cancer Staging  Squamous cell lung cancer, left (HCC) Staging form: Lung, AJCC 8th Edition - Clinical stage from 12/22/2022: Stage IVA (cT4, cN3, cM1a) - Unsigned    Prior Therapy: radiation therapy 12/15/22 - 12/29/22, with one dose carboplatin/taxol on 12/25/22   Current Therapy:  carboplatin/paclitaxel, durvalumab, tremelimumab     HISTORY OF PRESENT ILLNESS:   Oncology History  Squamous cell lung cancer, left (HCC)  12/22/2022 Initial Diagnosis   Squamous cell lung cancer, left (HCC)   12/25/2022 - 12/25/2022 Chemotherapy   Patient is on Treatment Plan : ESOPHAGUS Carboplatin + Paclitaxel Weekly X 6 Weeks with XRT     01/13/2023 -  Chemotherapy   Patient is on Treatment Plan : LUNG NSCLC Squamous Tremelimumab-actl (cycles 1,2,3,4,6) + Durvalumab + PAClitaxel + Carboplatin q21 days x 4 cycles / Durvalumab q28 days        INTERVAL HISTORY:   Rodney Mahoney is a 62 y.o. male presenting to clinic today for follow up of metastatic squamous cell lung cancer. He was last seen by me on 02/24/23.  Today, he states that he is doing well overall. His appetite level is at 50%. His energy level is at 50%.  PAST MEDICAL HISTORY:   Past Medical History: Past Medical History:  Diagnosis Date   Bronchitis    CAD (coronary artery disease)    a. cath 11/26/2014 EF 45%, moderate to severe inferior hypokinesis, 90% small D1, 100% mid to distal RCA with minimal collateral, 40% mid to distal LCx. Medical therapy as RCA occlusion appears to be completed event; DES to mid circumflex October 2019   Essential hypertension    Hyperlipidemia    Ischemic cardiomyopathy    NSTEMI (non-ST elevated myocardial infarction) (HCC)    11/26/14, 01/31/18   Squamous cell lung cancer, left [facility])     Surgical History: Past Surgical History:   Procedure Laterality Date   BRONCHIAL BIOPSY  12/01/2022   Procedure: BRONCHIAL BIOPSIES;  Surgeon: Martina Sinner, MD;  Location: [facility] ENDOSCOPY;  Service: Pulmonary;;   BRONCHIAL BRUSHINGS  12/01/2022   Procedure: BRONCHIAL BRUSHINGS;  Surgeon: Martina Sinner, MD;  Location: [facility] ENDOSCOPY;  Service: Pulmonary;;   BRONCHIAL WASHINGS  12/01/2022   Procedure: BRONCHIAL WASHINGS;  Surgeon: Martina Sinner, MD;  Location: [facility] ENDOSCOPY;  Service: Pulmonary;;   CARDIAC CATHETERIZATION N/A 11/26/2014   Procedure: Left Heart Cath and Coronary Angiography;  Surgeon: Peter M Swaziland, MD;  Location: [facility] INVASIVE CV LAB;  Service: Cardiovascular;  Laterality: N/A;   COLONOSCOPY     per patient: done in [place], age 49, couple of polyps.   COLONOSCOPY WITH PROPOFOL N/A 10/08/2022   Procedure: COLONOSCOPY WITH PROPOFOL;  Surgeon: Lanelle Bal, DO;  Location: [facility] ENDO SUITE;  Service: Endoscopy;  Laterality: N/A;  11:00 am   CORONARY STENT INTERVENTION N/A 01/31/2018   Procedure: CORONARY STENT INTERVENTION;  Surgeon: Runell Gess, MD;  Location: [facility] INVASIVE CV LAB;  Service: Cardiovascular;  Laterality: N/A;   HEMOSTASIS CONTROL  12/01/2022   Procedure: HEMOSTASIS CONTROL;  Surgeon: Martina Sinner, MD;  Location: [facility] ENDOSCOPY;  Service: Pulmonary;;  cold saline   IR IMAGING GUIDED PORT INSERTION  12/23/2022   LEFT HEART CATH AND CORONARY ANGIOGRAPHY N/A 01/31/2018   Procedure: LEFT HEART CATH AND CORONARY ANGIOGRAPHY;  Surgeon: Runell Gess, MD;  Location: [facility] INVASIVE CV LAB;  Service: Cardiovascular;  Laterality: N/A;   NECK SURGERY  2005   VIDEO BRONCHOSCOPY N/A 12/01/2022   Procedure: VIDEO BRONCHOSCOPY WITH FLUORO;  Surgeon: Martina Sinner, MD;  Location: [facility] ENDOSCOPY;  Service: Pulmonary;  Laterality: N/A;    Social History: Social History   Socioeconomic History   Marital status: Married    Spouse name: Not on file   Number of children: Not on file   Years of education: Not  on file   Highest education level: Not on file  Occupational History   Not on file  Tobacco Use   Smoking status: Every Day    Current packs/day: 1.00    Average packs/day: 1 pack/day for 42.0 years (42.0 ttl pk-yrs)    Types: Cigarettes   Smokeless tobacco: Never   Tobacco comments:    He used to quit and start back. He has not quit for the past 5 years.   Vaping Use   Vaping status: Never Used  Substance and Sexual Activity   Alcohol use: Yes    Comment: 10 cans of beer   Drug use: Yes    Types: Marijuana    Comment: smokes week here and there   Sexual activity: Yes    Birth control/protection: None  Other Topics Concern   Not on file  Social History Narrative   Works at [employer] and live with wife.    Social Determinants of Health   Financial Resource Strain: High Risk (12/31/2022)   Overall Financial Resource Strain (CARDIA)    Difficulty of Paying Living Expenses: Very hard  Food Insecurity: No Food Insecurity (01/20/2023)   Hunger Vital Sign    Worried About Running Out of Food in the Last Year: Never true    Ran Out of Food in the Last Year: Never true  Transportation Needs: No Transportation Needs (01/20/2023)   PRAPARE - Administrator, Civil Service (Medical): No    Lack of Transportation (Non-Medical): No  Physical Activity: Not on file  Stress: Not on file  Social Connections: Not on file  Intimate Partner Violence: Not At Risk (01/20/2023)   Humiliation, Afraid, Rape, and Kick questionnaire    Fear of Current or Ex-Partner: No    Emotionally Abused: No    Physically Abused: No    Sexually Abused: No    Family History: Family History  Problem Relation Age of Onset   Diabetes Mother    Heart attack Father    Cancer Sister    Hypertension Brother    Colon cancer Neg Hx    Prostate cancer Neg Hx    Lung cancer Neg Hx     Current Medications:  Current Outpatient Medications:    acetaminophen (TYLENOL) 500 MG tablet, Take 500 mg by  mouth every 6 (six) hours as needed for mild pain., Disp: , Rfl:    albuterol (VENTOLIN HFA) 108 (90 Base) MCG/ACT inhaler, Inhale 2 puffs into the lungs every 6 (six) hours as needed for wheezing or shortness of breath., Disp: 18 g, Rfl: 2   aluminum-magnesium hydroxide 200-200 MG/5ML suspension,  Take 10 mLs by mouth every 6 (six) hours as needed for indigestion., Disp: 480 mL, Rfl: 2   aspirin EC 81 MG tablet, Take 1 tablet (81 mg total) by mouth daily. Swallow whole., Disp: 90 tablet, Rfl: 3   atorvastatin (LIPITOR) 80 MG tablet, Take 1 tablet (80 mg total) by mouth daily. TAKE 1 TABLET BY MOUTH EVERY DAY AT 6 PM (Patient taking differently: Take 80 mg by mouth at bedtime.), Disp: 90 tablet, Rfl: 1   CARBOPLATIN IV, Inject into the vein once a week., Disp: , Rfl:    empagliflozin (JARDIANCE) 10 MG TABS tablet, Take 1 tablet (10 mg total) by mouth daily before breakfast., Disp: 30 tablet, Rfl: 3   ezetimibe (ZETIA) 10 MG tablet, Take 1 tablet by mouth once daily, Disp: 90 tablet, Rfl: 0   HYDROcodone-acetaminophen (NORCO) 7.5-325 MG tablet, Take 1 tablet by mouth every 6 (six) hours as needed for moderate pain., Disp: 120 tablet, Rfl: 0   lidocaine-prilocaine (EMLA) cream, Apply to affected area once, Disp: 30 g, Rfl: 3   lisinopril (ZESTRIL) 10 MG tablet, Take 1 tablet by mouth once daily, Disp: 30 tablet, Rfl: 0   Menthol, Topical Analgesic, (BIOFREEZE EX), Apply 1 Application topically daily as needed (pain)., Disp: , Rfl:    metoprolol succinate (TOPROL-XL) 25 MG 24 hr tablet, Take 1 tablet (25 mg total) by mouth daily., Disp: 90 tablet, Rfl: 3   nitroGLYCERIN (NITROSTAT) 0.4 MG SL tablet, Place 1 tablet (0.4 mg total) under the tongue every 5 (five) minutes as needed., Disp: 25 tablet, Rfl: 0   OLANZapine (ZYPREXA) 5 MG tablet, Take 1 tablet (5 mg total) by mouth at bedtime., Disp: 30 tablet, Rfl: 1   PACLITAXEL IV, Inject into the vein once a week., Disp: , Rfl:    prochlorperazine  (COMPAZINE) 10 MG tablet, Take 1 tablet (10 mg total) by mouth every 6 (six) hours as needed for nausea or vomiting., Disp: 60 tablet, Rfl: 3 No current facility-administered medications for this visit.  Facility-Administered Medications Ordered in Other Visits:    CARBOplatin (PARAPLATIN) 660 mg in sodium chloride 0.9 % 250 mL chemo infusion, 660 mg, Intravenous, Once, [name], MD   heparin lock flush 100 unit/mL, 500 Units, Intracatheter, Once PRN, [name], MD   PACLitaxel (TAXOL) 336 mg in sodium chloride 0.9 % 500 mL chemo infusion (> 80mg /m2), 175 mg/m2 (Treatment Plan Recorded), Intravenous, Once, [name], MD, Last Rate: 185 mL/hr at 03/17/23 1329, 336 mg at 03/17/23 1329   sodium chloride flush (NS) 0.9 % injection 10 mL, 10 mL, Intracatheter, PRN, [name], MD   Allergies: No Known Allergies  REVIEW OF SYSTEMS:   Review of Systems  Constitutional:  Negative for chills, fatigue and fever.  HENT:   Negative for lump/mass, mouth sores, nosebleeds, sore throat and trouble swallowing.   Eyes:  Negative for eye problems.  Respiratory:  Positive for cough and shortness of breath.   Cardiovascular:  Negative for chest pain, leg swelling and palpitations.  Gastrointestinal:  Negative for abdominal pain, constipation, diarrhea, nausea and vomiting.  Genitourinary:  Negative for bladder incontinence, difficulty urinating, dysuria, frequency, hematuria and nocturia.   Musculoskeletal:  Negative for arthralgias, back pain, flank pain, myalgias and neck pain.  Skin:  Negative for itching and rash.  Neurological:  Positive for dizziness and numbness. Negative for headaches.  Hematological:  Does not bruise/bleed easily.  Psychiatric/Behavioral:  Negative for depression, sleep disturbance and suicidal ideas. The patient is not nervous/anxious.  All other systems reviewed and are negative.    VITALS:   There were no vitals taken for this  visit.  Wt Readings from Last 3 Encounters:  03/17/23 174 lb (78.9 kg)  02/24/23 174 lb 1.6 oz (79 kg)  02/05/23 177 lb (80.3 kg)    There is no height or weight on file to calculate BMI.  Performance status (ECOG): 1 - Symptomatic but completely ambulatory  PHYSICAL EXAM:   Physical Exam Vitals and nursing note reviewed. Exam conducted with a chaperone present.  Constitutional:      Appearance: Normal appearance.  Cardiovascular:     Rate and Rhythm: Normal rate and regular rhythm.     Pulses: Normal pulses.     Heart sounds: Normal heart sounds.  Pulmonary:     Effort: Pulmonary effort is normal.     Breath sounds: Normal breath sounds.  Abdominal:     Palpations: Abdomen is soft. There is no hepatomegaly, splenomegaly or mass.     Tenderness: There is no abdominal tenderness.  Musculoskeletal:     Right lower leg: No edema.     Left lower leg: No edema.  Lymphadenopathy:     Cervical: No cervical adenopathy.     Right cervical: No superficial, deep or posterior cervical adenopathy.    Left cervical: No superficial, deep or posterior cervical adenopathy.     Upper Body:     Right upper body: No supraclavicular or axillary adenopathy.     Left upper body: No supraclavicular or axillary adenopathy.  Neurological:     General: No focal deficit present.     Mental Status: He is alert and oriented to person, place, and time.  Psychiatric:        Mood and Affect: Mood normal.        Behavior: Behavior normal.     LABS:      Latest Ref Rng & Units 03/17/2023    8:02 AM 02/24/2023    8:03 AM 02/03/2023    7:50 AM  CBC  WBC 4.0 - 10.5 K/uL 7.7  8.1  6.9   Hemoglobin 13.0 - 17.0 g/dL 16.1  09.6  04.5   Hematocrit 39.0 - 52.0 % 33.4  35.9  33.6   Platelets 150 - 400 K/uL 188  227  249       Latest Ref Rng & Units 03/17/2023    8:02 AM 02/24/2023    8:03 AM 02/03/2023    7:50 AM  CMP  Glucose 70 - 99 mg/dL 409  811  914   BUN 8 - 23 mg/dL 5  5  8    Creatinine 0.61 -  1.24 mg/dL 7.82  9.56  2.13   Sodium 135 - 145 mmol/L 137  138  137   Potassium 3.5 - 5.1 mmol/L 3.8  3.5  3.8   Chloride 98 - 111 mmol/L 102  102  104   CO2 22 - 32 mmol/L 26  27  27    Calcium 8.9 - 10.3 mg/dL 8.9  8.9  8.7   Total Protein 6.5 - 8.1 g/dL 6.9  6.9  6.3   Total Bilirubin 0.3 - 1.2 mg/dL 0.7  0.6  0.7   Alkaline Phos 38 - 126 U/L 103  104  88   AST 15 - 41 U/L 17  18  21    ALT 0 - 44 U/L 17  22  32      No results found for: "CEA1", "CEA" / No results  found for: "CEA1", "CEA" Lab Results  Component Value Date   PSA1 0.6 07/31/2021   No results found for: "CAN199" No results found for: "CAN125"  No results found for: "TOTALPROTELP", "ALBUMINELP", "A1GS", "A2GS", "BETS", "BETA2SER", "GAMS", "MSPIKE", "SPEI" No results found for: "TIBC", "FERRITIN", "IRONPCTSAT" No results found for: "LDH"   STUDIES:   No results found.

## 2023-03-17 ENCOUNTER — Inpatient Hospital Stay (HOSPITAL_BASED_OUTPATIENT_CLINIC_OR_DEPARTMENT_OTHER): Payer: 59 | Admitting: Hematology

## 2023-03-17 ENCOUNTER — Inpatient Hospital Stay: Payer: 59 | Attending: Hematology

## 2023-03-17 ENCOUNTER — Inpatient Hospital Stay: Payer: 59

## 2023-03-17 VITALS — BP 117/75 | HR 74 | Temp 97.6°F | Resp 18

## 2023-03-17 DIAGNOSIS — C3412 Malignant neoplasm of upper lobe, left bronchus or lung: Secondary | ICD-10-CM | POA: Diagnosis not present

## 2023-03-17 DIAGNOSIS — I10 Essential (primary) hypertension: Secondary | ICD-10-CM | POA: Insufficient documentation

## 2023-03-17 DIAGNOSIS — Z5189 Encounter for other specified aftercare: Secondary | ICD-10-CM | POA: Diagnosis not present

## 2023-03-17 DIAGNOSIS — R112 Nausea with vomiting, unspecified: Secondary | ICD-10-CM | POA: Insufficient documentation

## 2023-03-17 DIAGNOSIS — Z5111 Encounter for antineoplastic chemotherapy: Secondary | ICD-10-CM | POA: Insufficient documentation

## 2023-03-17 DIAGNOSIS — Z5112 Encounter for antineoplastic immunotherapy: Secondary | ICD-10-CM | POA: Insufficient documentation

## 2023-03-17 DIAGNOSIS — C3492 Malignant neoplasm of unspecified part of left bronchus or lung: Secondary | ICD-10-CM

## 2023-03-17 DIAGNOSIS — R079 Chest pain, unspecified: Secondary | ICD-10-CM | POA: Insufficient documentation

## 2023-03-17 LAB — CBC WITH DIFFERENTIAL/PLATELET
Abs Immature Granulocytes: 0.02 10*3/uL (ref 0.00–0.07)
Basophils Absolute: 0 10*3/uL (ref 0.0–0.1)
Basophils Relative: 1 %
Eosinophils Absolute: 0.2 10*3/uL (ref 0.0–0.5)
Eosinophils Relative: 2 %
HCT: 33.4 % — ABNORMAL LOW (ref 39.0–52.0)
Hemoglobin: 10.9 g/dL — ABNORMAL LOW (ref 13.0–17.0)
Immature Granulocytes: 0 %
Lymphocytes Relative: 26 %
Lymphs Abs: 2 10*3/uL (ref 0.7–4.0)
MCH: 30 pg (ref 26.0–34.0)
MCHC: 32.6 g/dL (ref 30.0–36.0)
MCV: 92 fL (ref 80.0–100.0)
Monocytes Absolute: 1 10*3/uL (ref 0.1–1.0)
Monocytes Relative: 13 %
Neutro Abs: 4.5 10*3/uL (ref 1.7–7.7)
Neutrophils Relative %: 58 %
Platelets: 188 10*3/uL (ref 150–400)
RBC: 3.63 MIL/uL — ABNORMAL LOW (ref 4.22–5.81)
RDW: 20.6 % — ABNORMAL HIGH (ref 11.5–15.5)
WBC: 7.7 10*3/uL (ref 4.0–10.5)
nRBC: 0 % (ref 0.0–0.2)

## 2023-03-17 LAB — COMPREHENSIVE METABOLIC PANEL
ALT: 17 U/L (ref 0–44)
AST: 17 U/L (ref 15–41)
Albumin: 3.6 g/dL (ref 3.5–5.0)
Alkaline Phosphatase: 103 U/L (ref 38–126)
Anion gap: 9 (ref 5–15)
BUN: <5 mg/dL — ABNORMAL LOW (ref 8–23)
CO2: 26 mmol/L (ref 22–32)
Calcium: 8.9 mg/dL (ref 8.9–10.3)
Chloride: 102 mmol/L (ref 98–111)
Creatinine, Ser: 0.5 mg/dL — ABNORMAL LOW (ref 0.61–1.24)
GFR, Estimated: >60 mL/min (ref >60)
Glucose, Bld: 115 mg/dL — ABNORMAL HIGH (ref 70–99)
Potassium: 3.8 mmol/L (ref 3.5–5.1)
Sodium: 137 mmol/L (ref 135–145)
Total Bilirubin: 0.7 mg/dL (ref 0.3–1.2)
Total Protein: 6.9 g/dL (ref 6.5–8.1)

## 2023-03-17 LAB — MAGNESIUM: Magnesium: 1.8 mg/dL (ref 1.7–2.4)

## 2023-03-17 MED ORDER — HEPARIN SOD (PORK) LOCK FLUSH 100 UNIT/ML IV SOLN
500.0000 [IU] | Freq: Once | INTRAVENOUS | Status: AC | PRN
  Administered 2023-03-17: 500 [IU]

## 2023-03-17 MED ORDER — SODIUM CHLORIDE 0.9 % IV SOLN
175.0000 mg/m2 | Freq: Once | INTRAVENOUS | Status: AC
  Administered 2023-03-17: 336 mg via INTRAVENOUS
  Filled 2023-03-17: qty 56

## 2023-03-17 MED ORDER — SODIUM CHLORIDE 0.9 % IV SOLN
664.5000 mg | Freq: Once | INTRAVENOUS | Status: AC
  Administered 2023-03-17: 660 mg via INTRAVENOUS
  Filled 2023-03-17: qty 66

## 2023-03-17 MED ORDER — TREMELIMUMAB-ACTL CHEMO INJECTION 25 MG/1.25ML
75.0000 mg | Freq: Once | INTRAVENOUS | Status: AC
  Administered 2023-03-17: 75 mg via INTRAVENOUS
  Filled 2023-03-17: qty 3.75

## 2023-03-17 MED ORDER — SODIUM CHLORIDE 0.9 % IV SOLN
150.0000 mg | Freq: Once | INTRAVENOUS | Status: AC
  Administered 2023-03-17: 150 mg via INTRAVENOUS
  Filled 2023-03-17: qty 150

## 2023-03-17 MED ORDER — FAMOTIDINE IN NACL 20-0.9 MG/50ML-% IV SOLN
20.0000 mg | Freq: Once | INTRAVENOUS | Status: AC
  Administered 2023-03-17: 20 mg via INTRAVENOUS
  Filled 2023-03-17: qty 50

## 2023-03-17 MED ORDER — SODIUM CHLORIDE 0.9 % IV SOLN
1500.0000 mg | Freq: Once | INTRAVENOUS | Status: AC
  Administered 2023-03-17: 1500 mg via INTRAVENOUS
  Filled 2023-03-17: qty 30

## 2023-03-17 MED ORDER — CETIRIZINE HCL 10 MG/ML IV SOLN
10.0000 mg | Freq: Once | INTRAVENOUS | Status: AC
  Administered 2023-03-17: 10 mg via INTRAVENOUS
  Filled 2023-03-17: qty 1

## 2023-03-17 MED ORDER — SODIUM CHLORIDE 0.9 % IV SOLN: Freq: Once | INTRAVENOUS | Status: AC

## 2023-03-17 MED ORDER — PALONOSETRON HCL INJECTION 0.25 MG/5ML
0.2500 mg | Freq: Once | INTRAVENOUS | Status: AC
  Administered 2023-03-17: 0.25 mg via INTRAVENOUS
  Filled 2023-03-17: qty 5

## 2023-03-17 MED ORDER — SODIUM CHLORIDE 0.9 % IV SOLN
20.0000 mg | Freq: Once | INTRAVENOUS | Status: AC
  Administered 2023-03-17: 20 mg via INTRAVENOUS
  Filled 2023-03-17: qty 20

## 2023-03-17 MED ORDER — SODIUM CHLORIDE 0.9% FLUSH
10.0000 mL | INTRAVENOUS | Status: DC | PRN
  Administered 2023-03-17: 10 mL

## 2023-03-17 NOTE — Patient Instructions (Signed)

## 2023-03-17 NOTE — Progress Notes (Signed)
Patient has been examined by Dr. Katragadda. Vital signs and labs have been reviewed by MD - ANC, Creatinine, LFTs, hemoglobin, and platelets are within treatment parameters per M.D. - pt may proceed with treatment.  Primary RN and pharmacy notified.  

## 2023-03-17 NOTE — Progress Notes (Signed)
Patient tolerated chemotherapy with no complaints voiced.  Side effects with management reviewed with understanding verbalized.  Port site clean and dry with no bruising or swelling noted at site.  Good blood return noted before and after administration of chemotherapy.  Band aid applied.  Patient left in satisfactory condition with VSS and no s/s of distress noted.   

## 2023-03-17 NOTE — Patient Instructions (Signed)
[facility] [place]  Discharge Instructions: Thank you for choosing [facility] to provide your oncology and hematology care.  If you have a lab appointment with the Cancer Center - please note that after April 8th, 2024, all labs will be drawn in the cancer center.  You do not have to check in or register with the main entrance as you have in the past but will complete your check-in in the cancer center.  Wear comfortable clothing and clothing appropriate for easy access to any Portacath or PICC line.   We strive to give you quality time with your provider. You may need to reschedule your appointment if you arrive late (15 or more minutes).  Arriving late affects you and other patients whose appointments are after yours.  Also, if you miss three or more appointments without notifying the office, you may be dismissed from the clinic at the provider's discretion.      For prescription refill requests, have your pharmacy contact our office and allow 72 hours for refills to be completed.    Today you received the following chemotherapy and/or immunotherapy agents imjudo, imfinzi, taxol, carbo.        To help prevent nausea and vomiting after your treatment, we encourage you to take your nausea medication as directed.  BELOW ARE SYMPTOMS THAT SHOULD BE REPORTED IMMEDIATELY: *FEVER GREATER THAN 100.4 F (38 C) OR HIGHER *CHILLS OR SWEATING *NAUSEA AND VOMITING THAT IS NOT CONTROLLED WITH YOUR NAUSEA MEDICATION *UNUSUAL SHORTNESS OF BREATH *UNUSUAL BRUISING OR BLEEDING *URINARY PROBLEMS (pain or burning when urinating, or frequent urination) *BOWEL PROBLEMS (unusual diarrhea, constipation, pain near the anus) TENDERNESS IN MOUTH AND THROAT WITH OR WITHOUT PRESENCE OF ULCERS (sore throat, sores in mouth, or a toothache) UNUSUAL RASH, SWELLING OR PAIN  UNUSUAL VAGINAL DISCHARGE OR ITCHING   Items with * indicate a potential emergency and should be followed up as soon as  possible or go to the Emergency Department if any problems should occur.  Please show the CHEMOTHERAPY ALERT CARD or IMMUNOTHERAPY ALERT CARD at check-in to the Emergency Department and triage nurse.  Should you have questions after your visit or need to cancel or reschedule your appointment, please contact [facility] CENTER AT [facility] [phone]  and follow the prompts.  Office hours are 8:00 a.m. to 4:30 p.m. Monday - Friday. Please note that voicemails left after 4:00 p.m. may not be returned until the following business day.  We are closed weekends and major holidays. You have access to a nurse at all times for urgent questions. Please call the main number to the clinic [phone] and follow the prompts.  For any non-urgent questions, you may also contact your provider using MyChart. We now offer e-Visits for anyone 77 and older to request care online for non-urgent symptoms. For details visit [email or web address].   Also download the MyChart app! Go to the app store, search "MyChart", open the app, select [place], and log in with your MyChart username and password.

## 2023-03-19 ENCOUNTER — Inpatient Hospital Stay: Payer: 59

## 2023-03-19 VITALS — BP 131/83 | HR 87 | Temp 97.4°F | Resp 18

## 2023-03-19 DIAGNOSIS — Z5111 Encounter for antineoplastic chemotherapy: Secondary | ICD-10-CM | POA: Diagnosis not present

## 2023-03-19 DIAGNOSIS — Z5189 Encounter for other specified aftercare: Secondary | ICD-10-CM | POA: Diagnosis not present

## 2023-03-19 DIAGNOSIS — C3492 Malignant neoplasm of unspecified part of left bronchus or lung: Secondary | ICD-10-CM

## 2023-03-19 DIAGNOSIS — Z5112 Encounter for antineoplastic immunotherapy: Secondary | ICD-10-CM | POA: Diagnosis not present

## 2023-03-19 DIAGNOSIS — R079 Chest pain, unspecified: Secondary | ICD-10-CM | POA: Diagnosis not present

## 2023-03-19 DIAGNOSIS — C3412 Malignant neoplasm of upper lobe, left bronchus or lung: Secondary | ICD-10-CM | POA: Diagnosis not present

## 2023-03-19 DIAGNOSIS — I10 Essential (primary) hypertension: Secondary | ICD-10-CM | POA: Diagnosis not present

## 2023-03-19 DIAGNOSIS — R112 Nausea with vomiting, unspecified: Secondary | ICD-10-CM | POA: Diagnosis not present

## 2023-03-19 MED ORDER — PEGFILGRASTIM-JMDB 6 MG/0.6ML ~~LOC~~ SOSY
6.0000 mg | PREFILLED_SYRINGE | Freq: Once | SUBCUTANEOUS | Status: AC
  Administered 2023-03-19: 6 mg via SUBCUTANEOUS
  Filled 2023-03-19: qty 0.6

## 2023-03-19 NOTE — Patient Instructions (Signed)
[facility]  Discharge Instructions: Thank you for choosing [facility] to provide your oncology and hematology care.  If you have a lab appointment with the Cancer Center - please note that after April 8th, 2024, all labs will be drawn in the cancer center.  You do not have to check in or register with the main entrance as you have in the past but will complete your check-in in the cancer center.  Wear comfortable clothing and clothing appropriate for easy access to any Portacath or PICC line.   We strive to give you quality time with your provider. You may need to reschedule your appointment if you arrive late (15 or more minutes).  Arriving late affects you and other patients whose appointments are after yours.  Also, if you miss three or more appointments without notifying the office, you may be dismissed from the clinic at the provider's discretion.      For prescription refill requests, have your pharmacy contact our office and allow 72 hours for refills to be completed.    Today you received the following Fulphila.  Pegfilgrastim Injection What is this medication? PEGFILGRASTIM (PEG fil gra stim) lowers the risk of infection in people who are receiving chemotherapy. It works by Systems analyst make more white blood cells, which protects your body from infection. It may also be used to help people who have been exposed to high doses of radiation. This medicine may be used for other purposes; ask your health care provider or pharmacist if you have questions. COMMON BRAND NAME(S): Cherly Hensen, Neulasta, Nyvepria, Stimufend, UDENYCA, UDENYCA ONBODY, Ziextenzo What should I tell my care team before I take this medication? They need to know if you have any of these conditions: Kidney disease Latex allergy Ongoing radiation therapy Sickle cell disease Skin reactions to acrylic adhesives (On-Body Injector only) An unusual or allergic reaction to  pegfilgrastim, filgrastim, other medications, foods, dyes, or preservatives Pregnant or trying to get pregnant Breast-feeding How should I use this medication? This medication is for injection under the skin. If you get this medication at home, you will be taught how to prepare and give the pre-filled syringe or how to use the On-body Injector. Refer to the patient Instructions for Use for detailed instructions. Use exactly as directed. Tell your care team immediately if you suspect that the On-body Injector may not have performed as intended or if you suspect the use of the On-body Injector resulted in a missed or partial dose. It is important that you put your used needles and syringes in a special sharps container. Do not put them in a trash can. If you do not have a sharps container, call your pharmacist or care team to get one. Talk to your care team about the use of this medication in children. While this medication may be prescribed for selected conditions, precautions do apply. Overdosage: If you think you have taken too much of this medicine contact a poison control center or emergency room at once. NOTE: This medicine is only for you. Do not share this medicine with others. What if I miss a dose? It is important not to miss your dose. Call your care team if you miss your dose. If you miss a dose due to an On-body Injector failure or leakage, a new dose should be administered as soon as possible using a single prefilled syringe for manual use. What may interact with this medication? Interactions have not been studied.  This list may not describe all possible interactions. Give your health care provider a list of all the medicines, herbs, non-prescription drugs, or dietary supplements you use. Also tell them if you smoke, drink alcohol, or use illegal drugs. Some items may interact with your medicine. What should I watch for while using this medication? Your condition will be monitored  carefully while you are receiving this medication. You may need blood work done while you are taking this medication. Talk to your care team about your risk of cancer. You may be more at risk for certain types of cancer if you take this medication. If you are going to need a MRI, CT scan, or other procedure, tell your care team that you are using this medication (On-Body Injector only). What side effects may I notice from receiving this medication? Side effects that you should report to your care team as soon as possible: Allergic reactions--skin rash, itching, hives, swelling of the face, lips, tongue, or throat Capillary leak syndrome--stomach or muscle pain, unusual weakness or fatigue, feeling faint or lightheaded, decrease in the amount of urine, swelling of the ankles, hands, or feet, trouble breathing High white blood cell level--fever, fatigue, trouble breathing, night sweats, change in vision, weight loss Inflammation of the aorta--fever, fatigue, back, chest, or stomach pain, severe headache Kidney injury (glomerulonephritis)--decrease in the amount of urine, red or dark brown urine, foamy or bubbly urine, swelling of the ankles, hands, or feet Shortness of breath or trouble breathing Spleen injury--pain in upper left stomach or shoulder Unusual bruising or bleeding Side effects that usually do not require medical attention (report to your care team if they continue or are bothersome): Bone pain Pain in the hands or feet This list may not describe all possible side effects. Call your doctor for medical advice about side effects. You may report side effects to FDA at 1-800-FDA-1088. Where should I keep my medication? Keep out of the reach of children. If you are using this medication at home, you will be instructed on how to store it. Throw away any unused medication after the expiration date on the label. NOTE: This sheet is a summary. It may not cover all possible information. If you  have questions about this medicine, talk to your doctor, pharmacist, or health care provider.  2024 Elsevier/Gold Standard (2021-05-02 00:00:00)     To help prevent nausea and vomiting after your treatment, we encourage you to take your nausea medication as directed.  BELOW ARE SYMPTOMS THAT SHOULD BE REPORTED IMMEDIATELY: *FEVER GREATER THAN 100.4 F (38 C) OR HIGHER *CHILLS OR SWEATING *NAUSEA AND VOMITING THAT IS NOT CONTROLLED WITH YOUR NAUSEA MEDICATION *UNUSUAL SHORTNESS OF BREATH *UNUSUAL BRUISING OR BLEEDING *URINARY PROBLEMS (pain or burning when urinating, or frequent urination) *BOWEL PROBLEMS (unusual diarrhea, constipation, pain near the anus) TENDERNESS IN MOUTH AND THROAT WITH OR WITHOUT PRESENCE OF ULCERS (sore throat, sores in mouth, or a toothache) UNUSUAL RASH, SWELLING OR PAIN  UNUSUAL VAGINAL DISCHARGE OR ITCHING   Items with * indicate a potential emergency and should be followed up as soon as possible or go to the Emergency Department if any problems should occur.  Please show the CHEMOTHERAPY ALERT CARD or IMMUNOTHERAPY ALERT CARD at check-in to the Emergency Department and triage nurse.  Should you have questions after your visit or need to cancel or reschedule your appointment, please contact [facility] [address] [phone]  and follow the prompts.  Office hours are 8:00 a.m. to 4:30 p.m. Monday -  Friday. Please note that voicemails left after 4:00 p.m. may not be returned until the following business day.  We are closed weekends and major holidays. You have access to a nurse at all times for urgent questions. Please call the main number to the clinic [phone] and follow the prompts.  For any non-urgent questions, you may also contact your provider using MyChart. We now offer e-Visits for anyone 25 and older to request care online for non-urgent symptoms. For details visit [email or web address].   Also download the MyChart app! Go to the app  store, search "MyChart", open the app, select , and log in with your MyChart username and password.

## 2023-03-19 NOTE — Progress Notes (Signed)
Patient here today Rodney Mahoney. Patient tolerated injection in left arm with no complaints voiced.  Site clean and dry with no bruising or swelling noted.  No complaints of pain.  Discharged with vital signs stable and no signs or symptoms of distress noted.

## 2023-03-22 ENCOUNTER — Other Ambulatory Visit: Payer: Self-pay | Admitting: Internal Medicine

## 2023-03-22 DIAGNOSIS — E782 Mixed hyperlipidemia: Secondary | ICD-10-CM

## 2023-03-22 DIAGNOSIS — I2510 Atherosclerotic heart disease of native coronary artery without angina pectoris: Secondary | ICD-10-CM

## 2023-03-23 ENCOUNTER — Other Ambulatory Visit: Payer: Self-pay

## 2023-03-23 ENCOUNTER — Telehealth: Payer: Self-pay | Admitting: Internal Medicine

## 2023-03-23 NOTE — Telephone Encounter (Signed)
Refills are at [facility] , patient advised to contact his pharmacy

## 2023-03-23 NOTE — Telephone Encounter (Signed)
Patient came into the office asking can he switch providers to see Rodney Mahoney. Reason for switching does not make decision fast enough. Patient call back # [phone].

## 2023-03-23 NOTE — Telephone Encounter (Signed)
Dr. Durwin Nora is an excellent provider, and I think it would be beneficial for the two to continue their relationship as it is.

## 2023-03-23 NOTE — Telephone Encounter (Signed)
Prescription Request  03/23/2023  LOV: 01/19/2023  What is the name of the medication or equipment?   atorvastatin (LIPITOR) 80 MG tablet   ezetimibe (ZETIA) 10 MG tablet   metoprolol succinate (TOPROL-XL) 25 MG 24 hr tablet (was given by Dr Ellin Saba in September) (patient has been out for a month) said should come from his pcp  empagliflozin (JARDIANCE) 10 MG TABS tablet [[id number]    Per patient it is a pill that looks like a diamond does not know name of it, he said has not taken it for 30 days? He thinks it is for diabetes medicine.  He said he is tired of running around to get his meds refill he tired all the time due to cancer treatments.   Have you contacted your pharmacy to request a refill? Yes   Which pharmacy would you like this sent to?  [facility] [address] [place] - [address] [place] [address] Phone: [phone] Fax: [phone]    Patient notified that their request is being sent to the clinical staff for review and that they should receive a response within 2 business days.   Please advise at  walked into the office

## 2023-03-24 ENCOUNTER — Telehealth: Payer: Self-pay | Admitting: Internal Medicine

## 2023-03-24 ENCOUNTER — Other Ambulatory Visit: Payer: Self-pay

## 2023-03-24 MED ORDER — OLANZAPINE 5 MG PO TABS: 5.0000 mg | ORAL_TABLET | Freq: Every day | ORAL | 1 refills | Status: DC

## 2023-03-24 NOTE — Telephone Encounter (Signed)
Refills sent to pharmacy. 

## 2023-03-24 NOTE — Telephone Encounter (Signed)
Scheduled appointment with Rodney Mahoney

## 2023-03-24 NOTE — Telephone Encounter (Signed)
Prescription Request  03/24/2023  LOV: 01/19/2023  What is the name of the medication or equipment? OLANZapine (ZYPREXA) 5 MG tablet [[id number]]    Have you contacted your pharmacy to request a refill? Yes   Which pharmacy would you like this sent to?  [facility] [address] [place] - [address] [place] [address] Phone: [phone] Fax: [phone]    Patient notified that their request is being sent to the clinical staff for review and that they should receive a response within 2 business days.   Please advise at Mobile [phone] (mobile)

## 2023-03-29 ENCOUNTER — Ambulatory Visit (HOSPITAL_COMMUNITY)
Admission: RE | Admit: 2023-03-29 | Discharge: 2023-03-29 | Disposition: A | Discharge status: Home / Self Care | Payer: 59 | Source: Ambulatory Visit | Attending: Hematology | Admitting: Hematology

## 2023-03-29 DIAGNOSIS — J439 Emphysema, unspecified: Secondary | ICD-10-CM | POA: Diagnosis not present

## 2023-03-29 DIAGNOSIS — K760 Fatty (change of) liver, not elsewhere classified: Secondary | ICD-10-CM | POA: Diagnosis not present

## 2023-03-29 DIAGNOSIS — C3490 Malignant neoplasm of unspecified part of unspecified bronchus or lung: Secondary | ICD-10-CM | POA: Diagnosis not present

## 2023-03-29 DIAGNOSIS — C3492 Malignant neoplasm of unspecified part of left bronchus or lung: Secondary | ICD-10-CM | POA: Insufficient documentation

## 2023-03-29 DIAGNOSIS — K5730 Diverticulosis of large intestine without perforation or abscess without bleeding: Secondary | ICD-10-CM | POA: Diagnosis not present

## 2023-03-29 MED ORDER — IOHEXOL 300 MG/ML  SOLN
100.0000 mL | Freq: Once | INTRAMUSCULAR | Status: AC | PRN
  Administered 2023-03-29: 100 mL via INTRAVENOUS

## 2023-03-29 NOTE — Telephone Encounter (Signed)
Rodney Mahoney came to [facility] okay to switch provider to her. Lawson Fiscal called the patient cell voicemail is full , home number left a voicemail to contact our office.

## 2023-04-01 NOTE — Progress Notes (Signed)
Disability forms completed as requested by Patient. Fax transmission confirmation received. Copy of forms at [facility] for pick-up as requested. No other needs or concerns noted at this time.

## 2023-04-06 NOTE — Progress Notes (Signed)
[facility] [address] [place] [address]    Clinic Day:  04/07/2023  Referring physician: Billie Lade, MD  Patient Care Team: Billie Lade, MD as PCP - General (Internal Medicine) [name], MD as PCP - Cardiology (Cardiology) [name], MD as Consulting Physician (Internal Medicine) [name], RN as Oncology Nurse Navigator (Medical Oncology) [name], MD as Medical Oncologist (Medical Oncology)   ASSESSMENT & PLAN:   Assessment: 1.  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  No weight loss or hemoptysis. - CT angio chest (11/30/2022): Left upper lobe mass with central necrosis and bulky mediastinal, left hilar and left lower neck adenopathy and lytic lesion in the right superior sternum. - Bronchoscopy (12/01/2022): Narrowing of LUL apical and posterior segment.  Otherwise normal-appearing bronchial trees. - LUL upper lobe biopsy (12/01/2022): Invasive moderately differentiated squamous cell carcinoma. - PET scan (12/16/2022): Left supraclavicular 3.2 cm mass, direct extension from mediastinal adenopathy.  Small right supraclavicular lymph node.  Large left lung mass 7.4 x 6.1 cm extending into the left hilum and AP window.  Prevascular and paratracheal lymph nodes and right paratracheal node.  Extensive involvement of sternum manubrium and first rib close to manubrium.  Soft tissue extension into the medial border of the overlying pectoralis muscle.  Discrete hypermetabolic right upper lobe nodule 10 mm.  No abdominal or pelvic metastatic disease. - He has received 1 dose of concurrent chemo XRT on 12/23/2022. - Left lung XRT 12 Gray in 4 fractions and left bronchus radiation 18 Gray in 6 fractions completed - [employer] (01/07/2023): PIK3CA, T p53, MSI high not detected. - NGS (01/03/2023): TMB-high.  MSI-stable.  PIK3CA and T p53  mutations present.  No other targetable mutations.  PD-L1 TPS 0%.  HER2 IHC 0. - Carboplatin, paclitaxel, durvalumab and tremelimumab cycle 1 started on 01/13/2023   2.  Social/family history: - Lives at home by himself.  His wife helps him but they are separated.  He is independent of ADLs and IADLs.  Retired from work in April 2024.  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  Current active smoker, 1 pack/day started at age 22 and quit at the time of diagnosis. - Sister had cancer.  2 paternal cousins had cancer.    Plan: 1.  Stage IV (T4 N2 M1) squamous cell lung cancer: - He completed 4 cycles of chemoimmunotherapy. - He denies any GI side effects.  His only side effect was decreased taste and appetite.  However he is continuing to eat and not losing weight. - He reported numbness in the toes on and off. - Reviewed CT CAP from 03/29/2023: Significant interval reduction in the size of the left suprahilar mass and improvement in adenopathy.  No new areas seen. - Reviewed labs from today: Normal LFTs.  CBC grossly normal.  TSH is 0.7. - We discussed giving him 2 more cycles of chemotherapy along with immunotherapy to consolidate the response seen.  As he is tolerating it very well, he is okay to continue with chemotherapy cycles 5 and 6.  He will proceed with cycle 5 today.  RTC 3 weeks for follow-up.   2.  Chest and neck tightness/pain: - Pain has significantly improved.  Does not require Norco on regular basis.   3.  Hypertension: - Continue Toprol-XL 25 mg daily.  Blood pressure today is 134/85.  4.  Nausea/vomiting: - Zyprexa 5 mg daily.  Use Compazine as needed.    Orders Placed This Encounter  Procedures   CBC with Differential/Platelet   Comprehensive metabolic panel   CBC with Differential (Cancer Center Only)    Standing Status:   Future    Standing Expiration Date:   04/27/2024   CMP (Cancer Center only)    Standing Status:   Future    Standing Expiration  Date:   04/27/2024   Magnesium    Standing Status:   Future    Standing Expiration Date:   04/27/2024   CBC with Differential (Cancer Center Only)    Standing Status:   Future    Standing Expiration Date:   07/13/2024   CMP (Cancer Center only)    Standing Status:   Future    Standing Expiration Date:   07/13/2024   Magnesium    Standing Status:   Future    Standing Expiration Date:   07/13/2024   TSH    Standing Status:   Future    Standing Expiration Date:   08/10/2024   CBC with Differential (Cancer Center Only)    Standing Status:   Future    Standing Expiration Date:   08/10/2024   CMP (Cancer Center only)    Standing Status:   Future    Standing Expiration Date:   08/10/2024   Magnesium    Standing Status:   Future    Standing Expiration Date:   08/10/2024      I,Katie Daubenspeck,acting as a scribe for [name], MD.,have documented all relevant documentation on the behalf of [name], MD,as directed by  [name], MD while in the presence of [name], MD.   I, [name] MD, have reviewed the above documentation for accuracy and completeness, and I agree with the above.   [name], MD   10/23/20245:27 PM  CHIEF COMPLAINT:   Diagnosis: metastatic squamous cell lung cancer    Cancer Staging  Squamous cell lung cancer, left (HCC) Staging form: Lung, AJCC 8th Edition - Clinical stage from 12/22/2022: Stage IVA (cT4, cN3, cM1a) - Unsigned    Prior Therapy: radiation therapy 12/15/22 - 12/29/22, with one dose carboplatin/taxol on 12/25/22   Current Therapy:  carboplatin/paclitaxel, durvalumab, tremelimumab     HISTORY OF PRESENT ILLNESS:   Oncology History  Squamous cell lung cancer, left (HCC)  12/22/2022 Initial Diagnosis   Squamous cell lung cancer, left (HCC)   12/25/2022 - 12/25/2022 Chemotherapy   Patient is on Treatment Plan : ESOPHAGUS Carboplatin + Paclitaxel Weekly X 6 Weeks with XRT     01/13/2023  -  Chemotherapy   Patient is on Treatment Plan : LUNG NSCLC Squamous Tremelimumab-actl (cycles 1,2,3,4,6) + Durvalumab + PAClitaxel + Carboplatin q21 days x 4 cycles / Durvalumab q28 days        INTERVAL HISTORY:   Tre is a 62 y.o. male presenting to clinic today for follow up of metastatic squamous cell lung cancer. He was last seen by me on 03/17/23.  Since his last visit, he underwent restaging CT C/A/P on 03/29/23 showing: significant interval reduction in size of treated partially cavitary mass of suprahilar left lung; complete resolution of previously-seen anterior RUL FDG-avid pulmonary nodules; significant improvement of previously-seen bulky FDG-avid lymphadenopathy; no evidence of lymphadenopathy or metastatic disease in abdomen or pelvis; sclerosis of a manubrial metastasis.  Today, he states that he is doing well overall. His appetite level is at 80%. His energy level  is at 50%.  PAST MEDICAL HISTORY:   Past Medical History: Past Medical History:  Diagnosis Date   Bronchitis    CAD (coronary artery disease)    a. cath 11/26/2014 EF 45%, moderate to severe inferior hypokinesis, 90% small D1, 100% mid to distal RCA with minimal collateral, 40% mid to distal LCx. Medical therapy as RCA occlusion appears to be completed event; DES to mid circumflex October 2019   Essential hypertension    Hyperlipidemia    Ischemic cardiomyopathy    NSTEMI (non-ST elevated myocardial infarction) (HCC)    11/26/14, 01/31/18   Squamous cell lung cancer, left [facility])     Surgical History: Past Surgical History:  Procedure Laterality Date   BRONCHIAL BIOPSY  12/01/2022   Procedure: BRONCHIAL BIOPSIES;  Surgeon: Martina Sinner, MD;  Location: [facility] ENDOSCOPY;  Service: Pulmonary;;   BRONCHIAL BRUSHINGS  12/01/2022   Procedure: BRONCHIAL BRUSHINGS;  Surgeon: Martina Sinner, MD;  Location: [facility] ENDOSCOPY;  Service: Pulmonary;;   BRONCHIAL WASHINGS  12/01/2022   Procedure: BRONCHIAL WASHINGS;   Surgeon: Martina Sinner, MD;  Location: [facility] ENDOSCOPY;  Service: Pulmonary;;   CARDIAC CATHETERIZATION N/A 11/26/2014   Procedure: Left Heart Cath and Coronary Angiography;  Surgeon: Peter M Swaziland, MD;  Location: [facility] INVASIVE CV LAB;  Service: Cardiovascular;  Laterality: N/A;   COLONOSCOPY     per patient: done in [place], age 89, couple of polyps.   COLONOSCOPY WITH PROPOFOL N/A 10/08/2022   Procedure: COLONOSCOPY WITH PROPOFOL;  Surgeon: Lanelle Bal, DO;  Location: [facility] ENDO SUITE;  Service: Endoscopy;  Laterality: N/A;  11:00 am   CORONARY STENT INTERVENTION N/A 01/31/2018   Procedure: CORONARY STENT INTERVENTION;  Surgeon: Runell Gess, MD;  Location: [facility] INVASIVE CV LAB;  Service: Cardiovascular;  Laterality: N/A;   HEMOSTASIS CONTROL  12/01/2022   Procedure: HEMOSTASIS CONTROL;  Surgeon: Martina Sinner, MD;  Location: [facility] ENDOSCOPY;  Service: Pulmonary;;  cold saline   IR IMAGING GUIDED PORT INSERTION  12/23/2022   LEFT HEART CATH AND CORONARY ANGIOGRAPHY N/A 01/31/2018   Procedure: LEFT HEART CATH AND CORONARY ANGIOGRAPHY;  Surgeon: Runell Gess, MD;  Location: [facility] INVASIVE CV LAB;  Service: Cardiovascular;  Laterality: N/A;   NECK SURGERY  2005   VIDEO BRONCHOSCOPY N/A 12/01/2022   Procedure: VIDEO BRONCHOSCOPY WITH FLUORO;  Surgeon: Martina Sinner, MD;  Location: [facility] ENDOSCOPY;  Service: Pulmonary;  Laterality: N/A;    Social History: Social History   Socioeconomic History   Marital status: Married    Spouse name: Not on file   Number of children: Not on file   Years of education: Not on file   Highest education level: Not on file  Occupational History   Not on file  Tobacco Use   Smoking status: Every Day    Current packs/day: 1.00    Average packs/day: 1 pack/day for 42.0 years (42.0 ttl pk-yrs)    Types: Cigarettes   Smokeless tobacco: Never   Tobacco comments:    He used to quit and start back. He has not quit for the past 5 years.   Vaping Use   Vaping  status: Never Used  Substance and Sexual Activity   Alcohol use: Yes    Comment: 10 cans of beer   Drug use: Yes    Types: Marijuana    Comment: smokes week here and there   Sexual activity: Yes    Birth control/protection: None  Other Topics Concern   Not on  file  Social History Narrative   Works at [employer] and live with wife.    Social Determinants of Health   Financial Resource Strain: High Risk (12/31/2022)   Overall Financial Resource Strain (CARDIA)    Difficulty of Paying Living Expenses: Very hard  Food Insecurity: No Food Insecurity (01/20/2023)   Hunger Vital Sign    Worried About Running Out of Food in the Last Year: Never true    Ran Out of Food in the Last Year: Never true  Transportation Needs: No Transportation Needs (01/20/2023)   PRAPARE - Administrator, Civil Service (Medical): No    Lack of Transportation (Non-Medical): No  Physical Activity: Not on file  Stress: Not on file  Social Connections: Not on file  Intimate Partner Violence: Not At Risk (01/20/2023)   Humiliation, Afraid, Rape, and Kick questionnaire    Fear of Current or Ex-Partner: No    Emotionally Abused: No    Physically Abused: No    Sexually Abused: No    Family History: Family History  Problem Relation Age of Onset   Diabetes Mother    Heart attack Father    Cancer Sister    Hypertension Brother    Colon cancer Neg Hx    Prostate cancer Neg Hx    Lung cancer Neg Hx     Current Medications:  Current Outpatient Medications:    acetaminophen (TYLENOL) 500 MG tablet, Take 500 mg by mouth every 6 (six) hours as needed for mild pain., Disp: , Rfl:    albuterol (VENTOLIN HFA) 108 (90 Base) MCG/ACT inhaler, Inhale 2 puffs into the lungs every 6 (six) hours as needed for wheezing or shortness of breath., Disp: 18 g, Rfl: 2   aluminum-magnesium hydroxide 200-200 MG/5ML suspension, Take 10 mLs by mouth every 6 (six) hours as needed for indigestion., Disp: 480 mL, Rfl: 2    aspirin EC 81 MG tablet, Take 1 tablet (81 mg total) by mouth daily. Swallow whole., Disp: 90 tablet, Rfl: 3   atorvastatin (LIPITOR) 80 MG tablet, TAKE 1 TABLET BY MOUTH ONCE DAILY AT 6PM., Disp: 90 tablet, Rfl: 0   CARBOPLATIN IV, Inject into the vein once a week., Disp: , Rfl:    empagliflozin (JARDIANCE) 10 MG TABS tablet, Take 1 tablet (10 mg total) by mouth daily before breakfast., Disp: 30 tablet, Rfl: 3   ezetimibe (ZETIA) 10 MG tablet, Take 1 tablet by mouth once daily, Disp: 90 tablet, Rfl: 0   HYDROcodone-acetaminophen (NORCO) 7.5-325 MG tablet, Take 1 tablet by mouth every 6 (six) hours as needed for moderate pain., Disp: 120 tablet, Rfl: 0   lidocaine-prilocaine (EMLA) cream, Apply to affected area once, Disp: 30 g, Rfl: 3   lisinopril (ZESTRIL) 10 MG tablet, Take 1 tablet by mouth once daily, Disp: 30 tablet, Rfl: 0   Menthol, Topical Analgesic, (BIOFREEZE EX), Apply 1 Application topically daily as needed (pain)., Disp: , Rfl:    metoprolol succinate (TOPROL-XL) 25 MG 24 hr tablet, Take 1 tablet (25 mg total) by mouth daily., Disp: 90 tablet, Rfl: 3   nitroGLYCERIN (NITROSTAT) 0.4 MG SL tablet, Place 1 tablet (0.4 mg total) under the tongue every 5 (five) minutes as needed., Disp: 25 tablet, Rfl: 0   OLANZapine (ZYPREXA) 5 MG tablet, Take 1 tablet (5 mg total) by mouth at bedtime., Disp: 30 tablet, Rfl: 1   PACLITAXEL IV, Inject into the vein once a week., Disp: , Rfl:    prochlorperazine (COMPAZINE) 10 MG  tablet, Take 1 tablet (10 mg total) by mouth every 6 (six) hours as needed for nausea or vomiting., Disp: 60 tablet, Rfl: 3 No current facility-administered medications for this visit.  Facility-Administered Medications Ordered in Other Visits:    0.9 %  sodium chloride infusion, , Intravenous, Continuous, [name], MD, Stopped at 04/07/23 1707   sodium chloride flush (NS) 0.9 % injection 10 mL, 10 mL, Intracatheter, PRN, [name], MD, 10 mL at 04/07/23 1706    Allergies: No Known Allergies  REVIEW OF SYSTEMS:   Review of Systems  Constitutional:  Negative for chills, fatigue and fever.  HENT:   Negative for lump/mass, mouth sores, nosebleeds, sore throat and trouble swallowing.   Eyes:  Negative for eye problems.  Respiratory:  Positive for cough. Negative for shortness of breath.   Cardiovascular:  Negative for chest pain, leg swelling and palpitations.  Gastrointestinal:  Negative for abdominal pain, constipation, diarrhea, nausea and vomiting.  Genitourinary:  Negative for bladder incontinence, difficulty urinating, dysuria, frequency, hematuria and nocturia.   Musculoskeletal:  Negative for arthralgias, back pain, flank pain, myalgias and neck pain.  Skin:  Negative for itching and rash.  Neurological:  Positive for numbness. Negative for dizziness and headaches.  Hematological:  Does not bruise/bleed easily.  Psychiatric/Behavioral:  Negative for depression, sleep disturbance and suicidal ideas. The patient is not nervous/anxious.   All other systems reviewed and are negative.    VITALS:   There were no vitals taken for this visit.  Wt Readings from Last 3 Encounters:  04/07/23 172 lb 3.2 oz (78.1 kg)  03/17/23 174 lb (78.9 kg)  02/24/23 174 lb 1.6 oz (79 kg)    There is no height or weight on file to calculate BMI.  Performance status (ECOG): 1 - Symptomatic but completely ambulatory  PHYSICAL EXAM:   Physical Exam Vitals and nursing note reviewed. Exam conducted with a chaperone present.  Constitutional:      Appearance: Normal appearance.  Cardiovascular:     Rate and Rhythm: Normal rate and regular rhythm.     Pulses: Normal pulses.     Heart sounds: Normal heart sounds.  Pulmonary:     Effort: Pulmonary effort is normal.     Breath sounds: Normal breath sounds.  Abdominal:     Palpations: Abdomen is soft. There is no hepatomegaly, splenomegaly or mass.     Tenderness: There is no abdominal tenderness.   Musculoskeletal:     Right lower leg: No edema.     Left lower leg: No edema.  Lymphadenopathy:     Cervical: No cervical adenopathy.     Right cervical: No superficial, deep or posterior cervical adenopathy.    Left cervical: No superficial, deep or posterior cervical adenopathy.     Upper Body:     Right upper body: No supraclavicular or axillary adenopathy.     Left upper body: No supraclavicular or axillary adenopathy.  Neurological:     General: No focal deficit present.     Mental Status: He is alert and oriented to person, place, and time.  Psychiatric:        Mood and Affect: Mood normal.        Behavior: Behavior normal.     LABS:      Latest Ref Rng & Units 04/07/2023    8:00 AM 03/17/2023    8:02 AM 02/24/2023    8:03 AM  CBC  WBC 4.0 - 10.5 K/uL 7.5  7.7  8.1  Hemoglobin 13.0 - 17.0 g/dL 95.2  84.1  32.4   Hematocrit 39.0 - 52.0 % 32.3  33.4  35.9   Platelets 150 - 400 K/uL 204  188  227       Latest Ref Rng & Units 04/07/2023    8:00 AM 03/17/2023    8:02 AM 02/24/2023    8:03 AM  CMP  Glucose 70 - 99 mg/dL 96  401  027   BUN 8 - 23 mg/dL 6  <5  5   Creatinine 2.53 - 1.24 mg/dL 6.64  4.03  4.74   Sodium 135 - 145 mmol/L 137  137  138   Potassium 3.5 - 5.1 mmol/L 3.5  3.8  3.5   Chloride 98 - 111 mmol/L 101  102  102   CO2 22 - 32 mmol/L 24  26  27    Calcium 8.9 - 10.3 mg/dL 8.9  8.9  8.9   Total Protein 6.5 - 8.1 g/dL 7.1  6.9  6.9   Total Bilirubin 0.3 - 1.2 mg/dL 0.5  0.7  0.6   Alkaline Phos 38 - 126 U/L 100  103  104   AST 15 - 41 U/L 19  17  18    ALT 0 - 44 U/L 24  17  22       No results found for: "CEA1", "CEA" / No results found for: "CEA1", "CEA" Lab Results  Component Value Date   PSA1 0.6 07/31/2021   No results found for: "QVZ563" No results found for: "CAN125"  No results found for: "TOTALPROTELP", "ALBUMINELP", "A1GS", "A2GS", "BETS", "BETA2SER", "GAMS", "MSPIKE", "SPEI" No results found for: "TIBC", "FERRITIN", "IRONPCTSAT" No  results found for: "LDH"   STUDIES:   CT CHEST ABDOMEN PELVIS W CONTRAST  Result Date: 04/05/2023 CLINICAL DATA:  Lung cancer restaging * Tracking Code: BO * EXAM: CT CHEST, ABDOMEN, AND PELVIS WITH CONTRAST TECHNIQUE: Multidetector CT imaging of the chest, abdomen and pelvis was performed following the standard protocol during bolus administration of intravenous contrast. RADIATION DOSE REDUCTION: This exam was performed according to the departmental dose-optimization program which includes automated exposure control, adjustment of the mA and/or kV according to patient size and/or use of iterative reconstruction technique. CONTRAST:  OMNIPAQUE IOHEXOL 300 MG/ML  SOLN COMPARISON:  CT abdomen pelvis, 01/20/2023, PET-CT, 12/16/2022 FINDINGS: CT CHEST FINDINGS Cardiovascular: Right chest port catheter. Aortic atherosclerosis. Normal heart size. Three-vessel coronary artery calcifications. No pericardial effusion. Mediastinum/Nodes: Significant improvement of previously seen FDG avid lymphadenopathy throughout the mediastinum. Matted, treated soft tissue of the left superior mediastinal and supraclavicular stations about the left carotid and subclavian artery origins (series 2, image 11). High right paratracheal lymph node measures 1.2 x 0.7 cm, previously 2.8 x 2.0 cm (series 2, image 19). Necrotic appearing left para-aortic lymph nodes, previously confluence with large mass, measuring 3.1 x 1.9 cm (series 2, image 23). Thyroid gland, trachea, and esophagus demonstrate no significant findings. Lungs/Pleura: Significant interval reduction in size of a treated partially cavitary of the suprahilar left lung, measuring 5.4 x 3.1 cm, previously 7.4 x 6.1 cm (series 3, image 44). Complete resolution of a previously seen anterior right upper lobe FDG avid pulmonary nodule (series 3, image 43). 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 Minimal  paraseptal emphysema. Elevation of the left hemidiaphragm. Musculoskeletal: No chest wall abnormality. No acute osseous findings. Sclerosis of the manubrium, in keeping with treated osseous metastasis (series 5, image 106). CT ABDOMEN PELVIS FINDINGS Hepatobiliary:  No solid liver abnormality is seen. Hepatic steatosis. No gallstones, gallbladder wall thickening, or biliary dilatation. Pancreas: Unremarkable. No pancreatic ductal dilatation or surrounding inflammatory changes. Spleen: Normal in size without significant abnormality. Adrenals/Urinary Tract: Adrenal glands are unremarkable. Kidneys are normal, without renal calculi, solid lesion, or hydronephrosis. Bladder is unremarkable. Stomach/Bowel: Stomach is within normal limits. Appendix appears normal. No evidence of bowel wall thickening, distention, or inflammatory changes. Sigmoid diverticulosis. Vascular/Lymphatic: Severe aortic atherosclerosis. No enlarged abdominal or pelvic lymph nodes. Reproductive: Prostatomegaly. Other: No abdominal wall hernia or abnormality. No ascites. Musculoskeletal: No acute osseous findings. IMPRESSION: 1. Significant interval reduction in size of a treated partially cavitary mass of the suprahilar left lung. 2. Complete resolution of a previously seen anterior right upper lobe FDG avid pulmonary nodule. 3. Significant improvement of previously seen bulky FDG avid lymphadenopathy throughout the mediastinum. 4. No evidence of lymphadenopathy or metastatic disease in the abdomen or pelvis. 5. Sclerosis of a manubrial metastasis. 6. Constellation of findings is consistent with treatment response. 7. 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 Attention on follow-up. 8. Hepatic steatosis. 9. Coronary artery disease. Aortic Atherosclerosis (ICD10-I70.0) and Emphysema (ICD10-J43.9). Electronically Signed   By: Jearld Lesch M.D.   On: 04/05/2023 15:18

## 2023-04-07 ENCOUNTER — Inpatient Hospital Stay: Payer: 59

## 2023-04-07 ENCOUNTER — Inpatient Hospital Stay (HOSPITAL_BASED_OUTPATIENT_CLINIC_OR_DEPARTMENT_OTHER): Payer: 59 | Admitting: Hematology

## 2023-04-07 VITALS — BP 139/65 | HR 91 | Temp 97.9°F | Resp 18

## 2023-04-07 DIAGNOSIS — C3412 Malignant neoplasm of upper lobe, left bronchus or lung: Secondary | ICD-10-CM | POA: Diagnosis not present

## 2023-04-07 DIAGNOSIS — R079 Chest pain, unspecified: Secondary | ICD-10-CM | POA: Diagnosis not present

## 2023-04-07 DIAGNOSIS — C3492 Malignant neoplasm of unspecified part of left bronchus or lung: Secondary | ICD-10-CM | POA: Diagnosis not present

## 2023-04-07 DIAGNOSIS — I10 Essential (primary) hypertension: Secondary | ICD-10-CM | POA: Diagnosis not present

## 2023-04-07 DIAGNOSIS — Z5112 Encounter for antineoplastic immunotherapy: Secondary | ICD-10-CM | POA: Diagnosis not present

## 2023-04-07 DIAGNOSIS — R112 Nausea with vomiting, unspecified: Secondary | ICD-10-CM | POA: Diagnosis not present

## 2023-04-07 DIAGNOSIS — Z5111 Encounter for antineoplastic chemotherapy: Secondary | ICD-10-CM | POA: Diagnosis not present

## 2023-04-07 DIAGNOSIS — Z5189 Encounter for other specified aftercare: Secondary | ICD-10-CM | POA: Diagnosis not present

## 2023-04-07 LAB — CBC WITH DIFFERENTIAL/PLATELET
Abs Immature Granulocytes: 0.01 10*3/uL (ref 0.00–0.07)
Basophils Absolute: 0 10*3/uL (ref 0.0–0.1)
Basophils Relative: 0 %
Eosinophils Absolute: 0.2 10*3/uL (ref 0.0–0.5)
Eosinophils Relative: 2 %
HCT: 32.3 % — ABNORMAL LOW (ref 39.0–52.0)
Hemoglobin: 10.7 g/dL — ABNORMAL LOW (ref 13.0–17.0)
Immature Granulocytes: 0 %
Lymphocytes Relative: 24 %
Lymphs Abs: 1.8 10*3/uL (ref 0.7–4.0)
MCH: 31.2 pg (ref 26.0–34.0)
MCHC: 33.1 g/dL (ref 30.0–36.0)
MCV: 94.2 fL (ref 80.0–100.0)
Monocytes Absolute: 0.9 10*3/uL (ref 0.1–1.0)
Monocytes Relative: 12 %
Neutro Abs: 4.6 10*3/uL (ref 1.7–7.7)
Neutrophils Relative %: 62 %
Platelets: 204 10*3/uL (ref 150–400)
RBC: 3.43 MIL/uL — ABNORMAL LOW (ref 4.22–5.81)
RDW: 19.4 % — ABNORMAL HIGH (ref 11.5–15.5)
WBC: 7.5 10*3/uL (ref 4.0–10.5)
nRBC: 0 % (ref 0.0–0.2)

## 2023-04-07 LAB — COMPREHENSIVE METABOLIC PANEL
ALT: 24 U/L (ref 0–44)
AST: 19 U/L (ref 15–41)
Albumin: 3.6 g/dL (ref 3.5–5.0)
Alkaline Phosphatase: 100 U/L (ref 38–126)
Anion gap: 12 (ref 5–15)
BUN: 6 mg/dL — ABNORMAL LOW (ref 8–23)
CO2: 24 mmol/L (ref 22–32)
Calcium: 8.9 mg/dL (ref 8.9–10.3)
Chloride: 101 mmol/L (ref 98–111)
Creatinine, Ser: 0.49 mg/dL — ABNORMAL LOW (ref 0.61–1.24)
GFR, Estimated: >60 mL/min (ref >60)
Glucose, Bld: 96 mg/dL (ref 70–99)
Potassium: 3.5 mmol/L (ref 3.5–5.1)
Sodium: 137 mmol/L (ref 135–145)
Total Bilirubin: 0.5 mg/dL (ref 0.3–1.2)
Total Protein: 7.1 g/dL (ref 6.5–8.1)

## 2023-04-07 LAB — TSH: TSH: 0.784 u[IU]/mL (ref 0.350–4.500)

## 2023-04-07 LAB — MAGNESIUM: Magnesium: 1.6 mg/dL — ABNORMAL LOW (ref 1.7–2.4)

## 2023-04-07 MED ORDER — CETIRIZINE HCL 10 MG/ML IV SOLN
10.0000 mg | Freq: Once | INTRAVENOUS | Status: AC
  Administered 2023-04-07: 10 mg via INTRAVENOUS
  Filled 2023-04-07: qty 1

## 2023-04-07 MED ORDER — PALONOSETRON HCL INJECTION 0.25 MG/5ML
0.2500 mg | Freq: Once | INTRAVENOUS | Status: AC
  Administered 2023-04-07: 0.25 mg via INTRAVENOUS
  Filled 2023-04-07: qty 5

## 2023-04-07 MED ORDER — SODIUM CHLORIDE 0.9 % IV SOLN: Freq: Once | INTRAVENOUS | Status: AC

## 2023-04-07 MED ORDER — HEPARIN SOD (PORK) LOCK FLUSH 100 UNIT/ML IV SOLN
500.0000 [IU] | Freq: Once | INTRAVENOUS | Status: AC | PRN
  Administered 2023-04-07: 500 [IU]

## 2023-04-07 MED ORDER — SODIUM CHLORIDE 0.9 % IV SOLN
150.0000 mg | Freq: Once | INTRAVENOUS | Status: AC
  Administered 2023-04-07: 150 mg via INTRAVENOUS
  Filled 2023-04-07: qty 5

## 2023-04-07 MED ORDER — CARBOPLATIN CHEMO INJECTION 600 MG/60ML
664.5000 mg | Freq: Once | INTRAVENOUS | Status: AC
  Administered 2023-04-07: 660 mg via INTRAVENOUS
  Filled 2023-04-07: qty 66

## 2023-04-07 MED ORDER — SODIUM CHLORIDE 0.9 % IV SOLN
175.0000 mg/m2 | Freq: Once | INTRAVENOUS | Status: AC
  Administered 2023-04-07: 336 mg via INTRAVENOUS
  Filled 2023-04-07: qty 56

## 2023-04-07 MED ORDER — SODIUM CHLORIDE 0.9 % IV SOLN
20.0000 mg | Freq: Once | INTRAVENOUS | Status: AC
  Administered 2023-04-07: 20 mg via INTRAVENOUS
  Filled 2023-04-07: qty 2

## 2023-04-07 MED ORDER — SODIUM CHLORIDE 0.9 % IV SOLN
1500.0000 mg | Freq: Once | INTRAVENOUS | Status: AC
  Administered 2023-04-07: 1500 mg via INTRAVENOUS
  Filled 2023-04-07: qty 30

## 2023-04-07 MED ORDER — SODIUM CHLORIDE 0.9 % IV SOLN
INTRAVENOUS | Status: DC
Start: 2023-04-07 — End: 2023-04-07

## 2023-04-07 MED ORDER — SODIUM CHLORIDE 0.9% FLUSH
10.0000 mL | INTRAVENOUS | Status: DC | PRN
Start: 2023-04-07 — End: 2023-04-07
  Administered 2023-04-07: 10 mL

## 2023-04-07 MED ORDER — FAMOTIDINE IN NACL 20-0.9 MG/50ML-% IV SOLN
20.0000 mg | Freq: Once | INTRAVENOUS | Status: AC
  Administered 2023-04-07: 20 mg via INTRAVENOUS
  Filled 2023-04-07: qty 50

## 2023-04-07 MED ORDER — MAGNESIUM SULFATE 2 GM/50ML IV SOLN
2.0000 g | Freq: Once | INTRAVENOUS | Status: AC
  Administered 2023-04-07: 2 g via INTRAVENOUS
  Filled 2023-04-07: qty 50

## 2023-04-07 NOTE — Patient Instructions (Signed)
[facility]  Discharge Instructions: Thank you for choosing [facility] to provide your oncology and hematology care.  If you have a lab appointment with the Cancer Center - please note that after April 8th, 2024, all labs will be drawn in the cancer center.  You do not have to check in or register with the main entrance as you have in the past but will complete your check-in in the cancer center.  Wear comfortable clothing and clothing appropriate for easy access to any Portacath or PICC line.   We strive to give you quality time with your provider. You may need to reschedule your appointment if you arrive late (15 or more minutes).  Arriving late affects you and other patients whose appointments are after yours.  Also, if you miss three or more appointments without notifying the office, you may be dismissed from the clinic at the provider's discretion.      For prescription refill requests, have your pharmacy contact our office and allow 72 hours for refills to be completed.    Today you received the following chemotherapy and/or immunotherapy agents Imfinzi/Taxol/Carboplatin.  Durvalumab Injection What is this medication? DURVALUMAB (dur VAL ue mab) treats some types of cancer. It works by helping your immune system slow or stop the spread of cancer cells. It is a monoclonal antibody. This medicine may be used for other purposes; ask your health care provider or pharmacist if you have questions. COMMON BRAND NAME(S): IMFINZI What should I tell my care team before I take this medication? They need to know if you have any of these conditions: Allogeneic stem cell transplant (uses someone else's stem cells) Autoimmune diseases, such as Crohn disease, ulcerative colitis, lupus History of chest radiation Nervous system problems, such as Guillain-Barre syndrome, myasthenia gravis Organ transplant An unusual or allergic reaction to durvalumab, other  medications, foods, dyes, or preservatives Pregnant or trying to get pregnant Breast-feeding How should I use this medication? This medication is infused into a vein. It is given by your care team in a hospital or clinic setting. A special MedGuide will be given to you before each treatment. Be sure to read this information carefully each time. Talk to your care team about the use of this medication in children. Special care may be needed. Overdosage: If you think you have taken too much of this medicine contact a poison control center or emergency room at once. NOTE: This medicine is only for you. Do not share this medicine with others. What if I miss a dose? Keep appointments for follow-up doses. It is important not to miss your dose. Call your care team if you are unable to keep an appointment. What may interact with this medication? Interactions have not been studied. This list may not describe all possible interactions. Give your health care provider a list of all the medicines, herbs, non-prescription drugs, or dietary supplements you use. Also tell them if you smoke, drink alcohol, or use illegal drugs. Some items may interact with your medicine. What should I watch for while using this medication? Your condition will be monitored carefully while you are receiving this medication. You may need blood work while taking this medication. This medication may cause serious skin reactions. They can happen weeks to months after starting the medication. Contact your care team right away if you notice fevers or flu-like symptoms with a rash. The rash may be red or purple and then turn into blisters or peeling of  the skin. You may also notice a red rash with swelling of the face, lips, or lymph nodes in your neck or under your arms. Tell your care team right away if you have any change in your eyesight. Talk to your care team if you may be pregnant. Serious birth defects can occur if you take this  medication during pregnancy and for 3 months after the last dose. You will need a negative pregnancy test before starting this medication. Contraception is recommended while taking this medication and for 3 months after the last dose. Your care team can help you find the option that works for you. Do not breastfeed while taking this medication and for 3 months after the last dose. What side effects may I notice from receiving this medication? Side effects that you should report to your care team as soon as possible: Allergic reactions--skin rash, itching, hives, swelling of the face, lips, tongue, or throat Dry cough, shortness of breath or trouble breathing Eye pain, redness, irritation, or discharge with blurry or decreased vision Heart muscle inflammation--unusual weakness or fatigue, shortness of breath, chest pain, fast or irregular heartbeat, dizziness, swelling of the ankles, feet, or hands Hormone gland problems--headache, sensitivity to light, unusual weakness or fatigue, dizziness, fast or irregular heartbeat, increased sensitivity to cold or heat, excessive sweating, constipation, hair loss, increased thirst or amount of urine, tremors or shaking, irritability Infusion reactions--chest pain, shortness of breath or trouble breathing, feeling faint or lightheaded Kidney injury (glomerulonephritis)--decrease in the amount of urine, red or dark Jowell Bossi urine, foamy or bubbly urine, swelling of the ankles, hands, or feet Liver injury--right upper belly pain, loss of appetite, nausea, light-colored stool, dark yellow or Enolia Koepke urine, yellowing skin or eyes, unusual weakness or fatigue Pain, tingling, or numbness in the hands or feet, muscle weakness, change in vision, confusion or trouble speaking, loss of balance or coordination, trouble walking, seizures Rash, fever, and swollen lymph nodes Redness, blistering, peeling, or loosening of the skin, including inside the mouth Sudden or severe stomach  pain, bloody diarrhea, fever, nausea, vomiting Side effects that usually do not require medical attention (report these to your care team if they continue or are bothersome): Bone, joint, or muscle pain Diarrhea Fatigue Loss of appetite Nausea Skin rash This list may not describe all possible side effects. Call your doctor for medical advice about side effects. You may report side effects to FDA at 1-800-FDA-1088. Where should I keep my medication? This medication is given in a hospital or clinic. It will not be stored at home. NOTE: This sheet is a summary. It may not cover all possible information. If you have questions about this medicine, talk to your doctor, pharmacist, or health care provider.  2024 Elsevier/Gold Standard (2021-10-14 00:00:00)    Paclitaxel Injection What is this medication? PACLITAXEL (PAK li TAX el) treats some types of cancer. It works by slowing down the growth of cancer cells. This medicine may be used for other purposes; ask your health care provider or pharmacist if you have questions. COMMON BRAND NAME(S): Onxol, Taxol What should I tell my care team before I take this medication? They need to know if you have any of these conditions: Heart disease Liver disease Low white blood cell levels An unusual or allergic reaction to paclitaxel, other medications, foods, dyes, or preservatives If you or your partner are pregnant or trying to get pregnant Breast-feeding How should I use this medication? This medication is injected into a vein. It is given  by your care team in a hospital or clinic setting. Talk to your care team about the use of this medication in children. While it may be given to children for selected conditions, precautions do apply. Overdosage: If you think you have taken too much of this medicine contact a poison control center or emergency room at once. NOTE: This medicine is only for you. Do not share this medicine with others. What if I  miss a dose? Keep appointments for follow-up doses. It is important not to miss your dose. Call your care team if you are unable to keep an appointment. What may interact with this medication? Do not take this medication with any of the following: Live virus vaccines Other medications may affect the way this medication works. Talk with your care team about all of the medications you take. They may suggest changes to your treatment plan to lower the risk of side effects and to make sure your medications work as intended. This list may not describe all possible interactions. Give your health care provider a list of all the medicines, herbs, non-prescription drugs, or dietary supplements you use. Also tell them if you smoke, drink alcohol, or use illegal drugs. Some items may interact with your medicine. What should I watch for while using this medication? Your condition will be monitored carefully while you are receiving this medication. You may need blood work while taking this medication. This medication may make you feel generally unwell. This is not uncommon as chemotherapy can affect healthy cells as well as cancer cells. Report any side effects. Continue your course of treatment even though you feel ill unless your care team tells you to stop. This medication can cause serious allergic reactions. To reduce the risk, your care team may give you other medications to take before receiving this one. Be sure to follow the directions from your care team. This medication may increase your risk of getting an infection. Call your care team for advice if you get a fever, chills, sore throat, or other symptoms of a cold or flu. Do not treat yourself. Try to avoid being around people who are sick. This medication may increase your risk to bruise or bleed. Call your care team if you notice any unusual bleeding. Be careful brushing or flossing your teeth or using a toothpick because you may get an infection or  bleed more easily. If you have any dental work done, tell your dentist you are receiving this medication. Talk to your care team if you may be pregnant. Serious birth defects can occur if you take this medication during pregnancy. Talk to your care team before breastfeeding. Changes to your treatment plan may be needed. What side effects may I notice from receiving this medication? Side effects that you should report to your care team as soon as possible: Allergic reactions--skin rash, itching, hives, swelling of the face, lips, tongue, or throat Heart rhythm changes--fast or irregular heartbeat, dizziness, feeling faint or lightheaded, chest pain, trouble breathing Increase in blood pressure Infection--fever, chills, cough, sore throat, wounds that don't heal, pain or trouble when passing urine, general feeling of discomfort or being unwell Low blood pressure--dizziness, feeling faint or lightheaded, blurry vision Low red blood cell level--unusual weakness or fatigue, dizziness, headache, trouble breathing Painful swelling, warmth, or redness of the skin, blisters or sores at the infusion site Pain, tingling, or numbness in the hands or feet Slow heartbeat--dizziness, feeling faint or lightheaded, confusion, trouble breathing, unusual weakness or fatigue Unusual  bruising or bleeding Side effects that usually do not require medical attention (report to your care team if they continue or are bothersome): Diarrhea Hair loss Joint pain Loss of appetite Muscle pain Nausea Vomiting This list may not describe all possible side effects. Call your doctor for medical advice about side effects. You may report side effects to FDA at 1-800-FDA-1088. Where should I keep my medication? This medication is given in a hospital or clinic. It will not be stored at home. NOTE: This sheet is a summary. It may not cover all possible information. If you have questions about this medicine, talk to your doctor,  pharmacist, or health care provider.  2024 Elsevier/Gold Standard (2021-10-21 00:00:00)    Carboplatin Injection What is this medication? CARBOPLATIN (KAR boe pla tin) treats some types of cancer. It works by slowing down the growth of cancer cells. This medicine may be used for other purposes; ask your health care provider or pharmacist if you have questions. COMMON BRAND NAME(S): Paraplatin What should I tell my care team before I take this medication? They need to know if you have any of these conditions: Blood disorders Hearing problems Kidney disease Recent or ongoing radiation therapy An unusual or allergic reaction to carboplatin, cisplatin, other medications, foods, dyes, or preservatives Pregnant or trying to get pregnant Breast-feeding How should I use this medication? This medication is injected into a vein. It is given by your care team in a hospital or clinic setting. Talk to your care team about the use of this medication in children. Special care may be needed. Overdosage: If you think you have taken too much of this medicine contact a poison control center or emergency room at once. NOTE: This medicine is only for you. Do not share this medicine with others. What if I miss a dose? Keep appointments for follow-up doses. It is important not to miss your dose. Call your care team if you are unable to keep an appointment. What may interact with this medication? Medications for seizures Some antibiotics, such as amikacin, gentamicin, neomycin, streptomycin, tobramycin Vaccines This list may not describe all possible interactions. Give your health care provider a list of all the medicines, herbs, non-prescription drugs, or dietary supplements you use. Also tell them if you smoke, drink alcohol, or use illegal drugs. Some items may interact with your medicine. What should I watch for while using this medication? Your condition will be monitored carefully while you are  receiving this medication. You may need blood work while taking this medication. This medication may make you feel generally unwell. This is not uncommon, as chemotherapy can affect healthy cells as well as cancer cells. Report any side effects. Continue your course of treatment even though you feel ill unless your care team tells you to stop. In some cases, you may be given additional medications to help with side effects. Follow all directions for their use. This medication may increase your risk of getting an infection. Call your care team for advice if you get a fever, chills, sore throat, or other symptoms of a cold or flu. Do not treat yourself. Try to avoid being around people who are sick. Avoid taking medications that contain aspirin, acetaminophen, ibuprofen, naproxen, or ketoprofen unless instructed by your care team. These medications may hide a fever. Be careful brushing or flossing your teeth or using a toothpick because you may get an infection or bleed more easily. If you have any dental work done, tell your dentist you are  receiving this medication. Talk to your care team if you wish to become pregnant or think you might be pregnant. This medication can cause serious birth defects. Talk to your care team about effective forms of contraception. Do not breast-feed while taking this medication. What side effects may I notice from receiving this medication? Side effects that you should report to your care team as soon as possible: Allergic reactions--skin rash, itching, hives, swelling of the face, lips, tongue, or throat Infection--fever, chills, cough, sore throat, wounds that don't heal, pain or trouble when passing urine, general feeling of discomfort or being unwell Low red blood cell level--unusual weakness or fatigue, dizziness, headache, trouble breathing Pain, tingling, or numbness in the hands or feet, muscle weakness, change in vision, confusion or trouble speaking, loss of  balance or coordination, trouble walking, seizures Unusual bruising or bleeding Side effects that usually do not require medical attention (report to your care team if they continue or are bothersome): Hair loss Nausea Unusual weakness or fatigue Vomiting This list may not describe all possible side effects. Call your doctor for medical advice about side effects. You may report side effects to FDA at 1-800-FDA-1088. Where should I keep my medication? This medication is given in a hospital or clinic. It will not be stored at home. NOTE: This sheet is a summary. It may not cover all possible information. If you have questions about this medicine, talk to your doctor, pharmacist, or health care provider.  2024 Elsevier/Gold Standard (2021-09-23 00:00:00)        To help prevent nausea and vomiting after your treatment, we encourage you to take your nausea medication as directed.  BELOW ARE SYMPTOMS THAT SHOULD BE REPORTED IMMEDIATELY: *FEVER GREATER THAN 100.4 F (38 C) OR HIGHER *CHILLS OR SWEATING *NAUSEA AND VOMITING THAT IS NOT CONTROLLED WITH YOUR NAUSEA MEDICATION *UNUSUAL SHORTNESS OF BREATH *UNUSUAL BRUISING OR BLEEDING *URINARY PROBLEMS (pain or burning when urinating, or frequent urination) *BOWEL PROBLEMS (unusual diarrhea, constipation, pain near the anus) TENDERNESS IN MOUTH AND THROAT WITH OR WITHOUT PRESENCE OF ULCERS (sore throat, sores in mouth, or a toothache) UNUSUAL RASH, SWELLING OR PAIN  UNUSUAL VAGINAL DISCHARGE OR ITCHING   Items with * indicate a potential emergency and should be followed up as soon as possible or go to the Emergency Department if any problems should occur.  Please show the CHEMOTHERAPY ALERT CARD or IMMUNOTHERAPY ALERT CARD at check-in to the Emergency Department and triage nurse.  Should you have questions after your visit or need to cancel or reschedule your appointment, please contact [facility] [phone]  and  follow the prompts.  Office hours are 8:00 a.m. to 4:30 p.m. Monday - Friday. Please note that voicemails left after 4:00 p.m. may not be returned until the following business day.  We are closed weekends and major holidays. You have access to a nurse at all times for urgent questions. Please call the main number to the clinic [phone] and follow the prompts.  For any non-urgent questions, you may also contact your provider using MyChart. We now offer e-Visits for anyone 36 and older to request care online for non-urgent symptoms. For details visit [email or web address].   Also download the MyChart app! Go to the app store, search "MyChart", open the app, select [facility], and log in with your MyChart username and password.

## 2023-04-07 NOTE — Patient Instructions (Signed)
[facility] at [facility] Discharge Instructions   You were seen and examined today by Dr. Ellin Saba.  He reviewed the results of your lab work which are normal/stable.   He reviewed the results of your CT scan which shows a very good response to treatment. 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   We will proceed with your treatment today.   Return as scheduled.    Thank you for choosing [facility] at [facility] to provide your oncology and hematology care.  To afford each patient quality time with our provider, please arrive at least 15 minutes before your scheduled appointment time.   If you have a lab appointment with the Cancer Center please come in thru the Main Entrance and check in at the main information desk.  You need to re-schedule your appointment should you arrive 10 or more minutes late.  We strive to give you quality time with our providers, and arriving late affects you and other patients whose appointments are after yours.  Also, if you no show three or more times for appointments you may be dismissed from the clinic at the providers discretion.     Again, thank you for choosing [facility]  Our hope is that these requests will decrease the amount of time that you wait before being seen by our physicians.       _____________________________________________________________  Should you have questions after your visit to [facility], please contact our office at [phone] and follow the prompts.  Our office hours are 8:00 a.m. and 4:30 p.m. Monday - Friday.  Please note that voicemails left after 4:00 p.m. may not be returned until the following business day.  We are closed weekends and major holidays.  You do have access to a nurse 24-7, just call the main number to the clinic [phone] and do not press any options, hold on the line and a nurse will answer the phone.    For  prescription refill requests, have your pharmacy contact our office and allow 72 hours.    Due to Covid, you will need to wear a mask upon entering the hospital. If you do not have a mask, a mask will be given to you at the Main Entrance upon arrival. For doctor visits, patients may have 1 support person age 67 or older with them. For treatment visits, patients can not have anyone with them due to social distancing guidelines and our immunocompromised population.

## 2023-04-07 NOTE — Progress Notes (Signed)
Patient presents today for chemotherapy infusion. Patient is in satisfactory condition with no new complaints voiced.  Vital signs are stable.  Labs reviewed by Dr. Ellin Saba during the office visit and all labs are within treatment parameters.  Magnesium today is 1.6.  We will give magnesium sulfate 2 grams IV x one dose today per standing orders by Dr. Ellin Saba.  We will proceed with treatment per MD orders.

## 2023-04-07 NOTE — Progress Notes (Signed)
Treatment given today per MD orders. Tolerated infusion without adverse affects. Vital signs stable. No complaints at this time. Discharged from clinic ambulatory in stable condition. Alert and oriented x 3. F/U with [facility] as scheduled.

## 2023-04-09 ENCOUNTER — Ambulatory Visit: Payer: 59

## 2023-04-09 ENCOUNTER — Inpatient Hospital Stay: Payer: 59

## 2023-04-09 VITALS — BP 132/78 | HR 93 | Temp 97.8°F | Resp 18

## 2023-04-09 DIAGNOSIS — Z5111 Encounter for antineoplastic chemotherapy: Secondary | ICD-10-CM | POA: Diagnosis not present

## 2023-04-09 DIAGNOSIS — C3412 Malignant neoplasm of upper lobe, left bronchus or lung: Secondary | ICD-10-CM | POA: Diagnosis not present

## 2023-04-09 DIAGNOSIS — R112 Nausea with vomiting, unspecified: Secondary | ICD-10-CM | POA: Diagnosis not present

## 2023-04-09 DIAGNOSIS — R079 Chest pain, unspecified: Secondary | ICD-10-CM | POA: Diagnosis not present

## 2023-04-09 DIAGNOSIS — Z5112 Encounter for antineoplastic immunotherapy: Secondary | ICD-10-CM | POA: Diagnosis not present

## 2023-04-09 DIAGNOSIS — Z5189 Encounter for other specified aftercare: Secondary | ICD-10-CM | POA: Diagnosis not present

## 2023-04-09 DIAGNOSIS — I10 Essential (primary) hypertension: Secondary | ICD-10-CM | POA: Diagnosis not present

## 2023-04-09 DIAGNOSIS — C3492 Malignant neoplasm of unspecified part of left bronchus or lung: Secondary | ICD-10-CM

## 2023-04-09 MED ORDER — PEGFILGRASTIM-JMDB 6 MG/0.6ML ~~LOC~~ SOSY
6.0000 mg | PREFILLED_SYRINGE | Freq: Once | SUBCUTANEOUS | Status: AC
  Administered 2023-04-09: 6 mg via SUBCUTANEOUS
  Filled 2023-04-09: qty 0.6

## 2023-04-09 NOTE — Progress Notes (Signed)
Retacrit injection given per orders. Patient tolerated it well without problems. Vitals stable and discharged home from clinic ambulatory. Follow up as scheduled.  

## 2023-04-09 NOTE — Patient Instructions (Signed)
[facility]  Discharge Instructions: Thank you for choosing [facility] to provide your oncology and hematology care.  If you have a lab appointment with the Cancer Center - please note that after April 8th, 2024, all labs will be drawn in the cancer center.  You do not have to check in or register with the main entrance as you have in the past but will complete your check-in in the cancer center.  Wear comfortable clothing and clothing appropriate for easy access to any Portacath or PICC line.   We strive to give you quality time with your provider. You may need to reschedule your appointment if you arrive late (15 or more minutes).  Arriving late affects you and other patients whose appointments are after yours.  Also, if you miss three or more appointments without notifying the office, you may be dismissed from the clinic at the provider's discretion.      For prescription refill requests, have your pharmacy contact our office and allow 72 hours for refills to be completed.    Today you received the following injection:Retacrit   To help prevent nausea and vomiting after your treatment, we encourage you to take your nausea medication as directed.  BELOW ARE SYMPTOMS THAT SHOULD BE REPORTED IMMEDIATELY: *FEVER GREATER THAN 100.4 F (38 C) OR HIGHER *CHILLS OR SWEATING *NAUSEA AND VOMITING THAT IS NOT CONTROLLED WITH YOUR NAUSEA MEDICATION *UNUSUAL SHORTNESS OF BREATH *UNUSUAL BRUISING OR BLEEDING *URINARY PROBLEMS (pain or burning when urinating, or frequent urination) *BOWEL PROBLEMS (unusual diarrhea, constipation, pain near the anus) TENDERNESS IN MOUTH AND THROAT WITH OR WITHOUT PRESENCE OF ULCERS (sore throat, sores in mouth, or a toothache) UNUSUAL RASH, SWELLING OR PAIN  UNUSUAL VAGINAL DISCHARGE OR ITCHING   Items with * indicate a potential emergency and should be followed up as soon as possible or go to the Emergency Department if any problems  should occur.  Please show the CHEMOTHERAPY ALERT CARD or IMMUNOTHERAPY ALERT CARD at check-in to the Emergency Department and triage nurse.  Should you have questions after your visit or need to cancel or reschedule your appointment, please contact [facility] [phone]  and follow the prompts.  Office hours are 8:00 a.m. to 4:30 p.m. Monday - Friday. Please note that voicemails left after 4:00 p.m. may not be returned until the following business day.  We are closed weekends and major holidays. You have access to a nurse at all times for urgent questions. Please call the main number to the clinic [phone] and follow the prompts.  For any non-urgent questions, you may also contact your provider using MyChart. We now offer e-Visits for anyone 64 and older to request care online for non-urgent symptoms. For details visit [email or web address].   Also download the MyChart app! Go to the app store, search "MyChart", open the app, select , and log in with your MyChart username and password.

## 2023-04-20 ENCOUNTER — Other Ambulatory Visit: Payer: Self-pay | Admitting: Internal Medicine

## 2023-04-20 DIAGNOSIS — N529 Male erectile dysfunction, unspecified: Secondary | ICD-10-CM

## 2023-04-20 DIAGNOSIS — E782 Mixed hyperlipidemia: Secondary | ICD-10-CM

## 2023-04-27 ENCOUNTER — Other Ambulatory Visit: Payer: Self-pay

## 2023-04-27 NOTE — Progress Notes (Signed)
[facility] [address], [place] [address]    Clinic Day:  04/29/2023  Referring physician: Billie Lade, MD  Patient Care Team: Billie Lade, MD as PCP - General (Internal Medicine) [name], MD as PCP - Cardiology (Cardiology) [name], MD as Consulting Physician (Internal Medicine) [name], RN as Oncology Nurse Navigator (Medical Oncology) [name], MD as Medical Oncologist (Medical Oncology)   ASSESSMENT & PLAN:   Assessment: 1.  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  No weight loss or hemoptysis. - CT angio chest (11/30/2022): Left upper lobe mass with central necrosis and bulky mediastinal, left hilar and left lower neck adenopathy and lytic lesion in the right superior sternum. - Bronchoscopy (12/01/2022): Narrowing of LUL apical and posterior segment.  Otherwise normal-appearing bronchial trees. - LUL upper lobe biopsy (12/01/2022): Invasive moderately differentiated squamous cell carcinoma. - PET scan (12/16/2022): Left supraclavicular 3.2 cm mass, direct extension from mediastinal adenopathy.  Small right supraclavicular lymph node.  Large left lung mass 7.4 x 6.1 cm extending into the left hilum and AP window.  Prevascular and paratracheal lymph nodes and right paratracheal node.  Extensive involvement of sternum manubrium and first rib close to manubrium.  Soft tissue extension into the medial border of the overlying pectoralis muscle.  Discrete hypermetabolic right upper lobe nodule 10 mm.  No abdominal or pelvic metastatic disease. - He has received 1 dose of concurrent chemo XRT on 12/23/2022. - Left lung XRT 12 Gray in 4 fractions and left bronchus radiation 18 Gray in 6 fractions completed - [employer] (01/07/2023): PIK3CA, T p53, MSI high not detected. - NGS (01/03/2023): TMB-high.  MSI-stable.  PIK3CA and T p53  mutations present.  No other targetable mutations.  PD-L1 TPS 0%.  HER2 IHC 0. - Carboplatin, paclitaxel, durvalumab and tremelimumab cycle 1 started on 01/13/2023   2.  Social/family history: - Lives at home by himself.  His wife helps him but they are separated.  He is independent of ADLs and IADLs.  Retired from work in April 2024.  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  Current active smoker, 1 pack/day started at age 16 and quit at the time of diagnosis. - Sister had cancer.  2 paternal cousins had cancer.    Plan: 1.  Stage IV (T4 N2 M1) squamous cell lung cancer: - CT CAP on 03/29/1999 4:24 cycles of chemoimmunotherapy showed significant interval reduction in the size of the left suprahilar mass and improvement in the adenopathy. - He has tolerated cycle 5 on 04/07/2023 reasonably well. - He does not have a taste for food and also has decreased appetite but is eating and maintaining weight. - Reviewed labs today: Normal LFTs and creatinine.  CBC grossly normal with mild thrombocytopenia of 104.  TSH is 0.7. - He will proceed with cycle 6 today without any dose modifications.  After this cycle, he will continue durvalumab every 4 weeks.  RTC 7 weeks for follow-up with repeat labs.   2.  Peripheral neuropathy: - He has are not of tingling in the toes which is new.  He has numbness in the fingertips from neck surgery which is stable.  Will closely monitor.   3.  Hypertension: - Continue Toprol-XL 25 mg once daily.  Blood pressure is 1 3485.   4.  Nausea/vomiting: - Continue Zyprexa 5 mg daily.  Use Compazine as needed.    No orders of the defined types were placed in this encounter.     I,Katie Daubenspeck,acting as a Neurosurgeon for [name], MD.,have documented all relevant documentation on the behalf of [name], MD,as directed by  [name], MD while in the presence of [name], MD.   I, [name] MD, have  reviewed the above documentation for accuracy and completeness, and I agree with the above.   [name], MD   11/14/20248:00 AM  CHIEF COMPLAINT:   Diagnosis: metastatic squamous cell lung cancer    Cancer Staging  Squamous cell lung cancer, left (HCC) Staging form: Lung, AJCC 8th Edition - Clinical stage from 12/22/2022: Stage IVA (cT4, cN3, cM1a) - Unsigned    Prior Therapy: radiation therapy 12/15/22 - 12/29/22, with one dose carboplatin/taxol on 12/25/22   Current Therapy:  carboplatin/paclitaxel, durvalumab, tremelimumab     HISTORY OF PRESENT ILLNESS:   Oncology History  Squamous cell lung cancer, left (HCC)  12/22/2022 Initial Diagnosis   Squamous cell lung cancer, left (HCC)   12/25/2022 - 12/25/2022 Chemotherapy   Patient is on Treatment Plan : ESOPHAGUS Carboplatin + Paclitaxel Weekly X 6 Weeks with XRT     01/13/2023 -  Chemotherapy   Patient is on Treatment Plan : LUNG NSCLC Squamous Tremelimumab-actl (cycles 1,2,3,4,6) + Durvalumab + PAClitaxel + Carboplatin q21 days x 4 cycles / Durvalumab q28 days        INTERVAL HISTORY:   Rodney Mahoney is a 63 y.o. male presenting to clinic today for follow up of metastatic squamous cell lung cancer. He was last seen by me on 04/07/23.  Today, he states that he is doing well overall. His appetite level is at 65%. His energy level is at 70%.  PAST MEDICAL HISTORY:   Past Medical History: Past Medical History:  Diagnosis Date   Bronchitis    CAD (coronary artery disease)    a. cath 11/26/2014 EF 45%, moderate to severe inferior hypokinesis, 90% small D1, 100% mid to distal RCA with minimal collateral, 40% mid to distal LCx. Medical therapy as RCA occlusion appears to be completed event; DES to mid circumflex October 2019   Essential hypertension    Hyperlipidemia    Ischemic cardiomyopathy    NSTEMI (non-ST elevated myocardial infarction) (HCC)    11/26/14, 01/31/18   Squamous cell lung cancer, left [facility])     Surgical  History: Past Surgical History:  Procedure Laterality Date   BRONCHIAL BIOPSY  12/01/2022   Procedure: BRONCHIAL BIOPSIES;  Surgeon: Martina Sinner, MD;  Location: [facility] ENDOSCOPY;  Service: Pulmonary;;   BRONCHIAL BRUSHINGS  12/01/2022   Procedure: BRONCHIAL BRUSHINGS;  Surgeon: Martina Sinner, MD;  Location: [facility] ENDOSCOPY;  Service: Pulmonary;;   BRONCHIAL WASHINGS  12/01/2022   Procedure: BRONCHIAL WASHINGS;  Surgeon: Martina Sinner, MD;  Location: [facility] ENDOSCOPY;  Service: Pulmonary;;   CARDIAC CATHETERIZATION N/A 11/26/2014   Procedure: Left Heart Cath and Coronary Angiography;  Surgeon: Peter M Swaziland, MD;  Location: [facility] INVASIVE CV LAB;  Service: Cardiovascular;  Laterality: N/A;   COLONOSCOPY     per patient: done in [place], age 21, couple of polyps.   COLONOSCOPY WITH PROPOFOL N/A 10/08/2022   Procedure: COLONOSCOPY WITH PROPOFOL;  Surgeon: Lanelle Bal, DO;  Location: [facility] ENDO SUITE;  Service: Endoscopy;  Laterality: N/A;  11:00 am   CORONARY STENT INTERVENTION N/A 01/31/2018   Procedure: CORONARY STENT INTERVENTION;  Surgeon: Runell Gess, MD;  Location: [facility]  INVASIVE CV LAB;  Service: Cardiovascular;  Laterality: N/A;   HEMOSTASIS CONTROL  12/01/2022   Procedure: HEMOSTASIS CONTROL;  Surgeon: Martina Sinner, MD;  Location: [facility] ENDOSCOPY;  Service: Pulmonary;;  cold saline   IR IMAGING GUIDED PORT INSERTION  12/23/2022   LEFT HEART CATH AND CORONARY ANGIOGRAPHY N/A 01/31/2018   Procedure: LEFT HEART CATH AND CORONARY ANGIOGRAPHY;  Surgeon: Runell Gess, MD;  Location: [facility] INVASIVE CV LAB;  Service: Cardiovascular;  Laterality: N/A;   NECK SURGERY  2005   VIDEO BRONCHOSCOPY N/A 12/01/2022   Procedure: VIDEO BRONCHOSCOPY WITH FLUORO;  Surgeon: Martina Sinner, MD;  Location: [facility] ENDOSCOPY;  Service: Pulmonary;  Laterality: N/A;    Social History: Social History   Socioeconomic History   Marital status: Married    Spouse name: Not on file   Number of children: Not on  file   Years of education: Not on file   Highest education level: Not on file  Occupational History   Not on file  Tobacco Use   Smoking status: Every Day    Current packs/day: 1.00    Average packs/day: 1 pack/day for 42.0 years (42.0 ttl pk-yrs)    Types: Cigarettes   Smokeless tobacco: Never   Tobacco comments:    He used to quit and start back. He has not quit for the past 5 years.   Vaping Use   Vaping status: Never Used  Substance and Sexual Activity   Alcohol use: Yes    Comment: 10 cans of beer   Drug use: Yes    Types: Marijuana    Comment: smokes week here and there   Sexual activity: Yes    Birth control/protection: None  Other Topics Concern   Not on file  Social History Narrative   Works at [employer] and live with wife.    Social Determinants of Health   Financial Resource Strain: High Risk (12/31/2022)   Overall Financial Resource Strain (CARDIA)    Difficulty of Paying Living Expenses: Very hard  Food Insecurity: No Food Insecurity (01/20/2023)   Hunger Vital Sign    Worried About Running Out of Food in the Last Year: Never true    Ran Out of Food in the Last Year: Never true  Transportation Needs: No Transportation Needs (01/20/2023)   PRAPARE - Administrator, Civil Service (Medical): No    Lack of Transportation (Non-Medical): No  Physical Activity: Not on file  Stress: Not on file  Social Connections: Not on file  Intimate Partner Violence: Not At Risk (01/20/2023)   Humiliation, Afraid, Rape, and Kick questionnaire    Fear of Current or Ex-Partner: No    Emotionally Abused: No    Physically Abused: No    Sexually Abused: No    Family History: Family History  Problem Relation Age of Onset   Diabetes Mother    Heart attack Father    Cancer Sister    Hypertension Brother    Colon cancer Neg Hx    Prostate cancer Neg Hx    Lung cancer Neg Hx     Current Medications:  Current Outpatient Medications:    acetaminophen (TYLENOL)  500 MG tablet, Take 500 mg by mouth every 6 (six) hours as needed for mild pain., Disp: , Rfl:    albuterol (VENTOLIN HFA) 108 (90 Base) MCG/ACT inhaler, Inhale 2 puffs into the lungs every 6 (six) hours as needed for wheezing or shortness of breath., Disp: 18 g, Rfl: 2  aluminum-magnesium hydroxide 200-200 MG/5ML suspension, Take 10 mLs by mouth every 6 (six) hours as needed for indigestion., Disp: 480 mL, Rfl: 2   aspirin EC 81 MG tablet, Take 1 tablet (81 mg total) by mouth daily. Swallow whole., Disp: 90 tablet, Rfl: 3   atorvastatin (LIPITOR) 80 MG tablet, TAKE 1 TABLET BY MOUTH ONCE DAILY AT 6PM., Disp: 90 tablet, Rfl: 0   CARBOPLATIN IV, Inject into the vein once a week., Disp: , Rfl:    empagliflozin (JARDIANCE) 10 MG TABS tablet, Take 1 tablet (10 mg total) by mouth daily before breakfast., Disp: 30 tablet, Rfl: 3   ezetimibe (ZETIA) 10 MG tablet, Take 1 tablet by mouth once daily, Disp: 30 tablet, Rfl: 0   HYDROcodone-acetaminophen (NORCO) 7.5-325 MG tablet, Take 1 tablet by mouth every 6 (six) hours as needed for moderate pain., Disp: 120 tablet, Rfl: 0   lidocaine-prilocaine (EMLA) cream, Apply to affected area once, Disp: 30 g, Rfl: 3   lisinopril (ZESTRIL) 10 MG tablet, Take 1 tablet by mouth once daily, Disp: 30 tablet, Rfl: 0   Menthol, Topical Analgesic, (BIOFREEZE EX), Apply 1 Application topically daily as needed (pain)., Disp: , Rfl:    metoprolol succinate (TOPROL-XL) 25 MG 24 hr tablet, Take 1 tablet (25 mg total) by mouth daily., Disp: 90 tablet, Rfl: 3   nitroGLYCERIN (NITROSTAT) 0.4 MG SL tablet, Place 1 tablet (0.4 mg total) under the tongue every 5 (five) minutes as needed., Disp: 25 tablet, Rfl: 0   OLANZapine (ZYPREXA) 5 MG tablet, Take 1 tablet (5 mg total) by mouth at bedtime., Disp: 30 tablet, Rfl: 1   PACLITAXEL IV, Inject into the vein once a week., Disp: , Rfl:    prochlorperazine (COMPAZINE) 10 MG tablet, Take 1 tablet (10 mg total) by mouth every 6 (six) hours as  needed for nausea or vomiting., Disp: 60 tablet, Rfl: 3   sildenafil (VIAGRA) 50 MG tablet, TAKE 1 TABLET BY MOUTH ONCE DAILY AS NEEDED FOR ERECTILE DYSFUNCTION, Disp: 4 tablet, Rfl: 0   Allergies: No Known Allergies  REVIEW OF SYSTEMS:   Review of Systems  Constitutional:  Negative for chills, fatigue and fever.  HENT:   Negative for lump/mass, mouth sores, nosebleeds, sore throat and trouble swallowing.   Eyes:  Negative for eye problems.  Respiratory:  Positive for shortness of breath. Negative for cough.   Cardiovascular:  Negative for chest pain, leg swelling and palpitations.  Gastrointestinal:  Negative for abdominal pain, constipation, diarrhea, nausea and vomiting.  Genitourinary:  Negative for bladder incontinence, difficulty urinating, dysuria, frequency, hematuria and nocturia.   Musculoskeletal:  Negative for arthralgias, back pain, flank pain, myalgias and neck pain.  Skin:  Negative for itching and rash.  Neurological:  Positive for dizziness and numbness. Negative for headaches.  Hematological:  Does not bruise/bleed easily.  Psychiatric/Behavioral:  Negative for depression, sleep disturbance and suicidal ideas. The patient is not nervous/anxious.   All other systems reviewed and are negative.    VITALS:   There were no vitals taken for this visit.  Wt Readings from Last 3 Encounters:  04/28/23 172 lb 9.6 oz (78.3 kg)  04/07/23 172 lb 3.2 oz (78.1 kg)  03/17/23 174 lb (78.9 kg)    There is no height or weight on file to calculate BMI.  Performance status (ECOG): 1 - Symptomatic but completely ambulatory  PHYSICAL EXAM:   Physical Exam Vitals and nursing note reviewed. Exam conducted with a chaperone present.  Constitutional:  Appearance: Normal appearance.  Cardiovascular:     Rate and Rhythm: Normal rate and regular rhythm.     Pulses: Normal pulses.     Heart sounds: Normal heart sounds.  Pulmonary:     Effort: Pulmonary effort is normal.      Breath sounds: Normal breath sounds.  Abdominal:     Palpations: Abdomen is soft. There is no hepatomegaly, splenomegaly or mass.     Tenderness: There is no abdominal tenderness.  Musculoskeletal:     Right lower leg: No edema.     Left lower leg: No edema.  Lymphadenopathy:     Cervical: No cervical adenopathy.     Right cervical: No superficial, deep or posterior cervical adenopathy.    Left cervical: No superficial, deep or posterior cervical adenopathy.     Upper Body:     Right upper body: No supraclavicular or axillary adenopathy.     Left upper body: No supraclavicular or axillary adenopathy.  Neurological:     General: No focal deficit present.     Mental Status: He is alert and oriented to person, place, and time.  Psychiatric:        Mood and Affect: Mood normal.        Behavior: Behavior normal.     LABS:      Latest Ref Rng & Units 04/28/2023    7:54 AM 04/07/2023    8:00 AM 03/17/2023    8:02 AM  CBC  WBC 4.0 - 10.5 K/uL 5.8  7.5  7.7   Hemoglobin 13.0 - 17.0 g/dL 16.1  09.6  04.5   Hematocrit 39.0 - 52.0 % 33.7  32.3  33.4   Platelets 150 - 400 K/uL 104  204  188       Latest Ref Rng & Units 04/28/2023    7:54 AM 04/07/2023    8:00 AM 03/17/2023    8:02 AM  CMP  Glucose 70 - 99 mg/dL 409  96  811   BUN 8 - 23 mg/dL 7  6  <5   Creatinine 9.14 - 1.24 mg/dL 7.82  9.56  2.13   Sodium 135 - 145 mmol/L 137  137  137   Potassium 3.5 - 5.1 mmol/L 3.5  3.5  3.8   Chloride 98 - 111 mmol/L 102  101  102   CO2 22 - 32 mmol/L 25  24  26    Calcium 8.9 - 10.3 mg/dL 9.1  8.9  8.9   Total Protein 6.5 - 8.1 g/dL 7.2  7.1  6.9   Total Bilirubin <1.2 mg/dL 0.3  0.5  0.7   Alkaline Phos 38 - 126 U/L 97  100  103   AST 15 - 41 U/L 21  19  17    ALT 0 - 44 U/L 21  24  17       No results found for: "CEA1", "CEA" / No results found for: "CEA1", "CEA" Lab Results  Component Value Date   PSA1 0.6 07/31/2021   No results found for: "YQM578" No results found for:  "CAN125"  No results found for: "TOTALPROTELP", "ALBUMINELP", "A1GS", "A2GS", "BETS", "BETA2SER", "GAMS", "MSPIKE", "SPEI" No results found for: "TIBC", "FERRITIN", "IRONPCTSAT" No results found for: "LDH"   STUDIES:   No results found.

## 2023-04-28 ENCOUNTER — Inpatient Hospital Stay: Payer: 59

## 2023-04-28 ENCOUNTER — Inpatient Hospital Stay: Payer: 59 | Attending: Hematology

## 2023-04-28 ENCOUNTER — Inpatient Hospital Stay (HOSPITAL_BASED_OUTPATIENT_CLINIC_OR_DEPARTMENT_OTHER): Payer: 59 | Admitting: Hematology

## 2023-04-28 VITALS — BP 139/70 | HR 95 | Temp 98.3°F | Resp 18

## 2023-04-28 DIAGNOSIS — C3492 Malignant neoplasm of unspecified part of left bronchus or lung: Secondary | ICD-10-CM | POA: Diagnosis not present

## 2023-04-28 DIAGNOSIS — G629 Polyneuropathy, unspecified: Secondary | ICD-10-CM | POA: Diagnosis not present

## 2023-04-28 DIAGNOSIS — Z5189 Encounter for other specified aftercare: Secondary | ICD-10-CM | POA: Insufficient documentation

## 2023-04-28 DIAGNOSIS — C3412 Malignant neoplasm of upper lobe, left bronchus or lung: Secondary | ICD-10-CM | POA: Insufficient documentation

## 2023-04-28 DIAGNOSIS — I10 Essential (primary) hypertension: Secondary | ICD-10-CM | POA: Diagnosis not present

## 2023-04-28 DIAGNOSIS — D696 Thrombocytopenia, unspecified: Secondary | ICD-10-CM | POA: Insufficient documentation

## 2023-04-28 DIAGNOSIS — Z5112 Encounter for antineoplastic immunotherapy: Secondary | ICD-10-CM | POA: Insufficient documentation

## 2023-04-28 DIAGNOSIS — Z5111 Encounter for antineoplastic chemotherapy: Secondary | ICD-10-CM | POA: Diagnosis not present

## 2023-04-28 LAB — CBC WITH DIFFERENTIAL/PLATELET
Abs Immature Granulocytes: 0.01 10*3/uL (ref 0.00–0.07)
Basophils Absolute: 0 10*3/uL (ref 0.0–0.1)
Basophils Relative: 1 %
Eosinophils Absolute: 0.2 10*3/uL (ref 0.0–0.5)
Eosinophils Relative: 3 %
HCT: 33.7 % — ABNORMAL LOW (ref 39.0–52.0)
Hemoglobin: 10.9 g/dL — ABNORMAL LOW (ref 13.0–17.0)
Immature Granulocytes: 0 %
Lymphocytes Relative: 32 %
Lymphs Abs: 1.8 10*3/uL (ref 0.7–4.0)
MCH: 31.6 pg (ref 26.0–34.0)
MCHC: 32.3 g/dL (ref 30.0–36.0)
MCV: 97.7 fL (ref 80.0–100.0)
Monocytes Absolute: 0.7 10*3/uL (ref 0.1–1.0)
Monocytes Relative: 12 %
Neutro Abs: 3 10*3/uL (ref 1.7–7.7)
Neutrophils Relative %: 52 %
Platelets: 104 10*3/uL — ABNORMAL LOW (ref 150–400)
RBC: 3.45 MIL/uL — ABNORMAL LOW (ref 4.22–5.81)
RDW: 18.4 % — ABNORMAL HIGH (ref 11.5–15.5)
WBC: 5.8 10*3/uL (ref 4.0–10.5)
nRBC: 0 % (ref 0.0–0.2)

## 2023-04-28 LAB — COMPREHENSIVE METABOLIC PANEL
ALT: 21 U/L (ref 0–44)
AST: 21 U/L (ref 15–41)
Albumin: 3.9 g/dL (ref 3.5–5.0)
Alkaline Phosphatase: 97 U/L (ref 38–126)
Anion gap: 10 (ref 5–15)
BUN: 7 mg/dL — ABNORMAL LOW (ref 8–23)
CO2: 25 mmol/L (ref 22–32)
Calcium: 9.1 mg/dL (ref 8.9–10.3)
Chloride: 102 mmol/L (ref 98–111)
Creatinine, Ser: 0.5 mg/dL — ABNORMAL LOW (ref 0.61–1.24)
GFR, Estimated: >60 mL/min (ref >60)
Glucose, Bld: 105 mg/dL — ABNORMAL HIGH (ref 70–99)
Potassium: 3.5 mmol/L (ref 3.5–5.1)
Sodium: 137 mmol/L (ref 135–145)
Total Bilirubin: 0.3 mg/dL (ref <1.2)
Total Protein: 7.2 g/dL (ref 6.5–8.1)

## 2023-04-28 LAB — MAGNESIUM: Magnesium: 1.7 mg/dL (ref 1.7–2.4)

## 2023-04-28 MED ORDER — CETIRIZINE HCL 10 MG/ML IV SOLN
10.0000 mg | Freq: Once | INTRAVENOUS | Status: AC
Start: 2023-04-28 — End: 2023-04-28
  Administered 2023-04-28: 10 mg via INTRAVENOUS
  Filled 2023-04-28: qty 1

## 2023-04-28 MED ORDER — SODIUM CHLORIDE 0.9% FLUSH
10.0000 mL | INTRAVENOUS | Status: DC | PRN
  Administered 2023-04-28: 10 mL

## 2023-04-28 MED ORDER — PALONOSETRON HCL INJECTION 0.25 MG/5ML
0.2500 mg | Freq: Once | INTRAVENOUS | Status: AC
  Administered 2023-04-28: 0.25 mg via INTRAVENOUS
  Filled 2023-04-28: qty 5

## 2023-04-28 MED ORDER — SODIUM CHLORIDE 0.9 % IV SOLN
175.0000 mg/m2 | Freq: Once | INTRAVENOUS | Status: AC
  Administered 2023-04-28: 336 mg via INTRAVENOUS
  Filled 2023-04-28: qty 56

## 2023-04-28 MED ORDER — SODIUM CHLORIDE 0.9 % IV SOLN: Freq: Once | INTRAVENOUS | Status: AC

## 2023-04-28 MED ORDER — CARBOPLATIN CHEMO INJECTION 600 MG/60ML
664.5000 mg | Freq: Once | INTRAVENOUS | Status: AC
  Administered 2023-04-28: 660 mg via INTRAVENOUS
  Filled 2023-04-28: qty 66

## 2023-04-28 MED ORDER — SODIUM CHLORIDE 0.9 % IV SOLN
150.0000 mg | Freq: Once | INTRAVENOUS | Status: AC
  Administered 2023-04-28: 150 mg via INTRAVENOUS
  Filled 2023-04-28: qty 150

## 2023-04-28 MED ORDER — SODIUM CHLORIDE 0.9 % IV SOLN
1500.0000 mg | Freq: Once | INTRAVENOUS | Status: AC
  Administered 2023-04-28: 1500 mg via INTRAVENOUS
  Filled 2023-04-28: qty 30

## 2023-04-28 MED ORDER — HEPARIN SOD (PORK) LOCK FLUSH 100 UNIT/ML IV SOLN
500.0000 [IU] | Freq: Once | INTRAVENOUS | Status: AC | PRN
Start: 2023-04-28 — End: 2023-04-28
  Administered 2023-04-28: 500 [IU]

## 2023-04-28 MED ORDER — SODIUM CHLORIDE 0.9 % IV SOLN
20.0000 mg | Freq: Once | INTRAVENOUS | Status: AC
  Administered 2023-04-28: 20 mg via INTRAVENOUS
  Filled 2023-04-28: qty 2

## 2023-04-28 MED ORDER — FAMOTIDINE IN NACL 20-0.9 MG/50ML-% IV SOLN
20.0000 mg | Freq: Once | INTRAVENOUS | Status: AC
Start: 2023-04-28 — End: 2023-04-28
  Administered 2023-04-28: 20 mg via INTRAVENOUS
  Filled 2023-04-28: qty 50

## 2023-04-28 MED ORDER — SODIUM CHLORIDE 0.9 % IV SOLN
75.0000 mg | Freq: Once | INTRAVENOUS | Status: AC
  Administered 2023-04-28: 75 mg via INTRAVENOUS
  Filled 2023-04-28: qty 3.75

## 2023-04-28 NOTE — Progress Notes (Signed)
Patient presents today for chemotherapy infusion. Patient is in satisfactory condition with no new complaints voiced.  Vital signs are stable.  Labs reviewed by Dr. Katragadda during the office visit and all labs are within treatment parameters.  We will proceed with treatment per MD orders.   Patient tolerated treatment well with no complaints voiced.  Patient left ambulatory in stable condition.  Vital signs stable at discharge.  Follow up as scheduled.       

## 2023-04-28 NOTE — Patient Instructions (Signed)

## 2023-04-28 NOTE — Patient Instructions (Signed)
[facility] - A DEPT OF [facility]  Discharge Instructions: Thank you for choosing [facility] to provide your oncology and hematology care.  If you have a lab appointment with the Cancer Center - please note that after April 8th, 2024, all labs will be drawn in the cancer center.  You do not have to check in or register with the main entrance as you have in the past but will complete your check-in in the cancer center.  Wear comfortable clothing and clothing appropriate for easy access to any Portacath or PICC line.   We strive to give you quality time with your provider. You may need to reschedule your appointment if you arrive late (15 or more minutes).  Arriving late affects you and other patients whose appointments are after yours.  Also, if you miss three or more appointments without notifying the office, you may be dismissed from the clinic at the provider's discretion.      For prescription refill requests, have your pharmacy contact our office and allow 72 hours for refills to be completed.    Today you received the following chemotherapy and/or immunotherapy agents Imjudo/Imfinzi/Taxol/Carboplatin.  Tremelimumab Injection What is this medication? TREMELIMUMAB (tre mel IM ue mab) treats liver cancer and lung cancer. It works by helping your immune system slow or stop the spread of cancer cells. It is a monoclonal antibody. This medicine may be used for other purposes; ask your health care provider or pharmacist if you have questions. COMMON BRAND NAME(S): IMJUDO What should I tell my care team before I take this medication? They need to know if you have any of these conditions: Autoimmune conditions, such as Crohn disease, ulcerative colitis, lupus Nervous system conditions, such as Guillain-Barre syndrome or myasthenia gravis An unusual or allergic reaction to tremelimumab, other medications, foods, dyes, or preservatives Pregnant or  trying to get pregnant Breastfeeding How should I use this medication? This medication is infused into a vein. It is given by your care team in a hospital or clinic setting. A special MedGuide will be given to you before each treatment. Be sure to read this information carefully each time. Talk to your care team about the use of this medication in children. Special care may be needed. Overdosage: If you think you have taken too much of this medicine contact a poison control center or emergency room at once. NOTE: This medicine is only for you. Do not share this medicine with others. What if I miss a dose? Keep appointments for follow-up doses. It is important not to miss your dose. Call your care team if you are unable to keep an appointment. What may interact with this medication? Interactions have not been studied. This list may not describe all possible interactions. Give your health care provider a list of all the medicines, herbs, non-prescription drugs, or dietary supplements you use. Also tell them if you smoke, drink alcohol, or use illegal drugs. Some items may interact with your medicine. What should I watch for while using this medication? Your condition will be monitored carefully while you are receiving this medication. You may need blood work done while you are taking this medication. This medication may cause serious skin reactions. They can happen weeks to months after starting the medication. Contact your care team right away if you notice fevers or flu-like symptoms with a rash. The rash may be red or purple and then turn into blisters or peeling of the  skin. You may also notice a red rash with swelling of the face, lips, or lymph nodes in your neck or under your arms. Tell your care team right away if you have any change in your eyesight. Talk to your care team if you may be pregnant. Serious birth defects can occur if you take this medication during pregnancy and for 3 months  after the last dose. You will need a negative pregnancy test before starting this medication. Contraception is recommended while taking this medication and for 3 months after the last dose. Your care team can help you find the option that works for you. Do not breastfeed while taking this medication and for 3 months after the last dose. What side effects may I notice from receiving this medication? Side effects that you should report to your care team as soon as possible: Allergic reactions--skin rash, itching, hives, swelling of the face, lips, tongue, or throat Dry cough, shortness of breath or trouble breathing Eye pain, redness, irritation, or discharge with blurry or decreased vision Heart muscle inflammation--unusual weakness or fatigue, shortness of breath, chest pain, fast or irregular heartbeat, dizziness, swelling of the ankles, feet, or hands Hormone gland problems--headache, sensitivity to light, unusual weakness or fatigue, dizziness, fast or irregular heartbeat, increased sensitivity to cold or heat, excessive sweating, constipation, hair loss, increased thirst or amount of urine, tremors or shaking, irritability Infusion reactions--chest pain, shortness of breath or trouble breathing, feeling faint or lightheaded Kidney injury (glomerulonephritis)--decrease in the amount of urine, red or dark Aditya Nastasi urine, foamy or bubbly urine, swelling of the ankles, hands, or feet Liver injury--right upper belly pain, loss of appetite, nausea, light-colored stool, dark yellow or Granger Chui urine, yellowing skin or eyes, unusual weakness or fatigue Pain, tingling, or numbness in the hands or feet, muscle weakness, change in vision, confusion or trouble speaking, loss of balance or coordination, trouble walking, seizures Pancreatitis--severe stomach pain that spreads to your back or gets worse after eating or when touched, fever, nausea, vomiting Rash, fever, and swollen lymph nodes Redness, blistering,  peeling, or loosening of the skin, including inside the mouth Stomach pain that is severe, does not go away, or gets worse Sudden or severe stomach pain, bloody diarrhea, fever, nausea, vomiting Side effects that usually do not require medical attention (report these to your care team if they continue or are bothersome): Bone, joint, or muscle pain Diarrhea Fatigue Loss of appetite Nausea Skin rash This list may not describe all possible side effects. Call your doctor for medical advice about side effects. You may report side effects to FDA at 1-800-FDA-1088. Where should I keep my medication? This medication is given in a hospital or clinic. It will not be stored at home. NOTE: This sheet is a summary. It may not cover all possible information. If you have questions about this medicine, talk to your doctor, pharmacist, or health care provider.  2024 Elsevier/Gold Standard (2022-07-20 00:00:00)    Durvalumab Injection What is this medication? DURVALUMAB (dur VAL ue mab) treats some types of cancer. It works by helping your immune system slow or stop the spread of cancer cells. It is a monoclonal antibody. This medicine may be used for other purposes; ask your health care provider or pharmacist if you have questions. COMMON BRAND NAME(S): IMFINZI What should I tell my care team before I take this medication? They need to know if you have any of these conditions: Allogeneic stem cell transplant (uses someone else's stem cells) Autoimmune diseases, such  as Crohn disease, ulcerative colitis, lupus History of chest radiation Nervous system problems, such as Guillain-Barre syndrome, myasthenia gravis Organ transplant An unusual or allergic reaction to durvalumab, other medications, foods, dyes, or preservatives Pregnant or trying to get pregnant Breast-feeding How should I use this medication? This medication is infused into a vein. It is given by your care team in a hospital or clinic  setting. A special MedGuide will be given to you before each treatment. Be sure to read this information carefully each time. Talk to your care team about the use of this medication in children. Special care may be needed. Overdosage: If you think you have taken too much of this medicine contact a poison control center or emergency room at once. NOTE: This medicine is only for you. Do not share this medicine with others. What if I miss a dose? Keep appointments for follow-up doses. It is important not to miss your dose. Call your care team if you are unable to keep an appointment. What may interact with this medication? Interactions have not been studied. This list may not describe all possible interactions. Give your health care provider a list of all the medicines, herbs, non-prescription drugs, or dietary supplements you use. Also tell them if you smoke, drink alcohol, or use illegal drugs. Some items may interact with your medicine. What should I watch for while using this medication? Your condition will be monitored carefully while you are receiving this medication. You may need blood work while taking this medication. This medication may cause serious skin reactions. They can happen weeks to months after starting the medication. Contact your care team right away if you notice fevers or flu-like symptoms with a rash. The rash may be red or purple and then turn into blisters or peeling of the skin. You may also notice a red rash with swelling of the face, lips, or lymph nodes in your neck or under your arms. Tell your care team right away if you have any change in your eyesight. Talk to your care team if you may be pregnant. Serious birth defects can occur if you take this medication during pregnancy and for 3 months after the last dose. You will need a negative pregnancy test before starting this medication. Contraception is recommended while taking this medication and for 3 months after the last  dose. Your care team can help you find the option that works for you. Do not breastfeed while taking this medication and for 3 months after the last dose. What side effects may I notice from receiving this medication? Side effects that you should report to your care team as soon as possible: Allergic reactions--skin rash, itching, hives, swelling of the face, lips, tongue, or throat Dry cough, shortness of breath or trouble breathing Eye pain, redness, irritation, or discharge with blurry or decreased vision Heart muscle inflammation--unusual weakness or fatigue, shortness of breath, chest pain, fast or irregular heartbeat, dizziness, swelling of the ankles, feet, or hands Hormone gland problems--headache, sensitivity to light, unusual weakness or fatigue, dizziness, fast or irregular heartbeat, increased sensitivity to cold or heat, excessive sweating, constipation, hair loss, increased thirst or amount of urine, tremors or shaking, irritability Infusion reactions--chest pain, shortness of breath or trouble breathing, feeling faint or lightheaded Kidney injury (glomerulonephritis)--decrease in the amount of urine, red or dark Alannah Averhart urine, foamy or bubbly urine, swelling of the ankles, hands, or feet Liver injury--right upper belly pain, loss of appetite, nausea, light-colored stool, dark yellow or Tyton Abdallah urine,  yellowing skin or eyes, unusual weakness or fatigue Pain, tingling, or numbness in the hands or feet, muscle weakness, change in vision, confusion or trouble speaking, loss of balance or coordination, trouble walking, seizures Rash, fever, and swollen lymph nodes Redness, blistering, peeling, or loosening of the skin, including inside the mouth Sudden or severe stomach pain, bloody diarrhea, fever, nausea, vomiting Side effects that usually do not require medical attention (report these to your care team if they continue or are bothersome): Bone, joint, or muscle pain Diarrhea Fatigue Loss  of appetite Nausea Skin rash This list may not describe all possible side effects. Call your doctor for medical advice about side effects. You may report side effects to FDA at 1-800-FDA-1088. Where should I keep my medication? This medication is given in a hospital or clinic. It will not be stored at home. NOTE: This sheet is a summary. It may not cover all possible information. If you have questions about this medicine, talk to your doctor, pharmacist, or health care provider.  2024 Elsevier/Gold Standard (2021-10-14 00:00:00)    Paclitaxel Injection What is this medication? PACLITAXEL (PAK li TAX el) treats some types of cancer. It works by slowing down the growth of cancer cells. This medicine may be used for other purposes; ask your health care provider or pharmacist if you have questions. COMMON BRAND NAME(S): Onxol, Taxol What should I tell my care team before I take this medication? They need to know if you have any of these conditions: Heart disease Liver disease Low white blood cell levels An unusual or allergic reaction to paclitaxel, other medications, foods, dyes, or preservatives If you or your partner are pregnant or trying to get pregnant Breast-feeding How should I use this medication? This medication is injected into a vein. It is given by your care team in a hospital or clinic setting. Talk to your care team about the use of this medication in children. While it may be given to children for selected conditions, precautions do apply. Overdosage: If you think you have taken too much of this medicine contact a poison control center or emergency room at once. NOTE: This medicine is only for you. Do not share this medicine with others. What if I miss a dose? Keep appointments for follow-up doses. It is important not to miss your dose. Call your care team if you are unable to keep an appointment. What may interact with this medication? Do not take this medication with  any of the following: Live virus vaccines Other medications may affect the way this medication works. Talk with your care team about all of the medications you take. They may suggest changes to your treatment plan to lower the risk of side effects and to make sure your medications work as intended. This list may not describe all possible interactions. Give your health care provider a list of all the medicines, herbs, non-prescription drugs, or dietary supplements you use. Also tell them if you smoke, drink alcohol, or use illegal drugs. Some items may interact with your medicine. What should I watch for while using this medication? Your condition will be monitored carefully while you are receiving this medication. You may need blood work while taking this medication. This medication may make you feel generally unwell. This is not uncommon as chemotherapy can affect healthy cells as well as cancer cells. Report any side effects. Continue your course of treatment even though you feel ill unless your care team tells you to stop. This medication can  cause serious allergic reactions. To reduce the risk, your care team may give you other medications to take before receiving this one. Be sure to follow the directions from your care team. This medication may increase your risk of getting an infection. Call your care team for advice if you get a fever, chills, sore throat, or other symptoms of a cold or flu. Do not treat yourself. Try to avoid being around people who are sick. This medication may increase your risk to bruise or bleed. Call your care team if you notice any unusual bleeding. Be careful brushing or flossing your teeth or using a toothpick because you may get an infection or bleed more easily. If you have any dental work done, tell your dentist you are receiving this medication. Talk to your care team if you may be pregnant. Serious birth defects can occur if you take this medication during  pregnancy. Talk to your care team before breastfeeding. Changes to your treatment plan may be needed. What side effects may I notice from receiving this medication? Side effects that you should report to your care team as soon as possible: Allergic reactions--skin rash, itching, hives, swelling of the face, lips, tongue, or throat Heart rhythm changes--fast or irregular heartbeat, dizziness, feeling faint or lightheaded, chest pain, trouble breathing Increase in blood pressure Infection--fever, chills, cough, sore throat, wounds that don't heal, pain or trouble when passing urine, general feeling of discomfort or being unwell Low blood pressure--dizziness, feeling faint or lightheaded, blurry vision Low red blood cell level--unusual weakness or fatigue, dizziness, headache, trouble breathing Painful swelling, warmth, or redness of the skin, blisters or sores at the infusion site Pain, tingling, or numbness in the hands or feet Slow heartbeat--dizziness, feeling faint or lightheaded, confusion, trouble breathing, unusual weakness or fatigue Unusual bruising or bleeding Side effects that usually do not require medical attention (report to your care team if they continue or are bothersome): Diarrhea Hair loss Joint pain Loss of appetite Muscle pain Nausea Vomiting This list may not describe all possible side effects. Call your doctor for medical advice about side effects. You may report side effects to FDA at 1-800-FDA-1088. Where should I keep my medication? This medication is given in a hospital or clinic. It will not be stored at home. NOTE: This sheet is a summary. It may not cover all possible information. If you have questions about this medicine, talk to your doctor, pharmacist, or health care provider.  2024 Elsevier/Gold Standard (2021-10-21 00:00:00)    Carboplatin Injection What is this medication? CARBOPLATIN (KAR boe pla tin) treats some types of cancer. It works by slowing  down the growth of cancer cells. This medicine may be used for other purposes; ask your health care provider or pharmacist if you have questions. COMMON BRAND NAME(S): Paraplatin What should I tell my care team before I take this medication? They need to know if you have any of these conditions: Blood disorders Hearing problems Kidney disease Recent or ongoing radiation therapy An unusual or allergic reaction to carboplatin, cisplatin, other medications, foods, dyes, or preservatives Pregnant or trying to get pregnant Breast-feeding How should I use this medication? This medication is injected into a vein. It is given by your care team in a hospital or clinic setting. Talk to your care team about the use of this medication in children. Special care may be needed. Overdosage: If you think you have taken too much of this medicine contact a poison control center or emergency room at  once. NOTE: This medicine is only for you. Do not share this medicine with others. What if I miss a dose? Keep appointments for follow-up doses. It is important not to miss your dose. Call your care team if you are unable to keep an appointment. What may interact with this medication? Medications for seizures Some antibiotics, such as amikacin, gentamicin, neomycin, streptomycin, tobramycin Vaccines This list may not describe all possible interactions. Give your health care provider a list of all the medicines, herbs, non-prescription drugs, or dietary supplements you use. Also tell them if you smoke, drink alcohol, or use illegal drugs. Some items may interact with your medicine. What should I watch for while using this medication? Your condition will be monitored carefully while you are receiving this medication. You may need blood work while taking this medication. This medication may make you feel generally unwell. This is not uncommon, as chemotherapy can affect healthy cells as well as cancer cells. Report  any side effects. Continue your course of treatment even though you feel ill unless your care team tells you to stop. In some cases, you may be given additional medications to help with side effects. Follow all directions for their use. This medication may increase your risk of getting an infection. Call your care team for advice if you get a fever, chills, sore throat, or other symptoms of a cold or flu. Do not treat yourself. Try to avoid being around people who are sick. Avoid taking medications that contain aspirin, acetaminophen, ibuprofen, naproxen, or ketoprofen unless instructed by your care team. These medications may hide a fever. Be careful brushing or flossing your teeth or using a toothpick because you may get an infection or bleed more easily. If you have any dental work done, tell your dentist you are receiving this medication. Talk to your care team if you wish to become pregnant or think you might be pregnant. This medication can cause serious birth defects. Talk to your care team about effective forms of contraception. Do not breast-feed while taking this medication. What side effects may I notice from receiving this medication? Side effects that you should report to your care team as soon as possible: Allergic reactions--skin rash, itching, hives, swelling of the face, lips, tongue, or throat Infection--fever, chills, cough, sore throat, wounds that don't heal, pain or trouble when passing urine, general feeling of discomfort or being unwell Low red blood cell level--unusual weakness or fatigue, dizziness, headache, trouble breathing Pain, tingling, or numbness in the hands or feet, muscle weakness, change in vision, confusion or trouble speaking, loss of balance or coordination, trouble walking, seizures Unusual bruising or bleeding Side effects that usually do not require medical attention (report to your care team if they continue or are bothersome): Hair loss Nausea Unusual  weakness or fatigue Vomiting This list may not describe all possible side effects. Call your doctor for medical advice about side effects. You may report side effects to FDA at 1-800-FDA-1088. Where should I keep my medication? This medication is given in a hospital or clinic. It will not be stored at home. NOTE: This sheet is a summary. It may not cover all possible information. If you have questions about this medicine, talk to your doctor, pharmacist, or health care provider.  2024 Elsevier/Gold Standard (2021-09-23 00:00:00)        To help prevent nausea and vomiting after your treatment, we encourage you to take your nausea medication as directed.  BELOW ARE SYMPTOMS THAT SHOULD BE REPORTED  IMMEDIATELY: *FEVER GREATER THAN 100.4 F (38 C) OR HIGHER *CHILLS OR SWEATING *NAUSEA AND VOMITING THAT IS NOT CONTROLLED WITH YOUR NAUSEA MEDICATION *UNUSUAL SHORTNESS OF BREATH *UNUSUAL BRUISING OR BLEEDING *URINARY PROBLEMS (pain or burning when urinating, or frequent urination) *BOWEL PROBLEMS (unusual diarrhea, constipation, pain near the anus) TENDERNESS IN MOUTH AND THROAT WITH OR WITHOUT PRESENCE OF ULCERS (sore throat, sores in mouth, or a toothache) UNUSUAL RASH, SWELLING OR PAIN  UNUSUAL VAGINAL DISCHARGE OR ITCHING   Items with * indicate a potential emergency and should be followed up as soon as possible or go to the Emergency Department if any problems should occur.  Please show the CHEMOTHERAPY ALERT CARD or IMMUNOTHERAPY ALERT CARD at check-in to the Emergency Department and triage nurse.  Should you have questions after your visit or need to cancel or reschedule your appointment, please contact [facility] - A DEPT OF [facility] [phone]  and follow the prompts.  Office hours are 8:00 a.m. to 4:30 p.m. Monday - Friday. Please note that voicemails left after 4:00 p.m. may not be returned until the following business day.  We are closed  weekends and major holidays. You have access to a nurse at all times for urgent questions. Please call the main number to the clinic [phone] and follow the prompts.  For any non-urgent questions, you may also contact your provider using MyChart. We now offer e-Visits for anyone 62 and older to request care online for non-urgent symptoms. For details visit [email or web address].   Also download the MyChart app! Go to the app store, search "MyChart", open the app, select [facility], and log in with your MyChart username and password.

## 2023-04-28 NOTE — Progress Notes (Unsigned)
Patient has been examined by Dr. Katragadda. Vital signs and labs have been reviewed by MD - ANC, Creatinine, LFTs, hemoglobin, and platelets are within treatment parameters per M.D. - pt may proceed with treatment.  Primary RN and pharmacy notified.  

## 2023-04-29 ENCOUNTER — Ambulatory Visit: Payer: 59 | Admitting: Internal Medicine

## 2023-04-29 ENCOUNTER — Encounter (HOSPITAL_COMMUNITY): Payer: Self-pay | Admitting: Hematology

## 2023-04-29 ENCOUNTER — Other Ambulatory Visit: Payer: Self-pay

## 2023-04-30 ENCOUNTER — Encounter: Payer: Self-pay | Admitting: Internal Medicine

## 2023-04-30 ENCOUNTER — Inpatient Hospital Stay: Payer: 59

## 2023-04-30 VITALS — BP 142/89 | HR 93 | Temp 98.0°F | Resp 20

## 2023-04-30 DIAGNOSIS — I10 Essential (primary) hypertension: Secondary | ICD-10-CM | POA: Diagnosis not present

## 2023-04-30 DIAGNOSIS — Z5112 Encounter for antineoplastic immunotherapy: Secondary | ICD-10-CM | POA: Diagnosis not present

## 2023-04-30 DIAGNOSIS — C3412 Malignant neoplasm of upper lobe, left bronchus or lung: Secondary | ICD-10-CM | POA: Diagnosis not present

## 2023-04-30 DIAGNOSIS — Z5189 Encounter for other specified aftercare: Secondary | ICD-10-CM | POA: Diagnosis not present

## 2023-04-30 DIAGNOSIS — Z5111 Encounter for antineoplastic chemotherapy: Secondary | ICD-10-CM | POA: Diagnosis not present

## 2023-04-30 DIAGNOSIS — D696 Thrombocytopenia, unspecified: Secondary | ICD-10-CM | POA: Diagnosis not present

## 2023-04-30 DIAGNOSIS — C3492 Malignant neoplasm of unspecified part of left bronchus or lung: Secondary | ICD-10-CM

## 2023-04-30 DIAGNOSIS — G629 Polyneuropathy, unspecified: Secondary | ICD-10-CM | POA: Diagnosis not present

## 2023-04-30 MED ORDER — PEGFILGRASTIM-JMDB 6 MG/0.6ML ~~LOC~~ SOSY
6.0000 mg | PREFILLED_SYRINGE | Freq: Once | SUBCUTANEOUS | Status: AC
  Administered 2023-04-30: 6 mg via SUBCUTANEOUS
  Filled 2023-04-30: qty 0.6

## 2023-04-30 NOTE — Patient Instructions (Signed)

## 2023-04-30 NOTE — Progress Notes (Signed)
Patient tolerated  Fulphila injection with no complaints voiced.  Site clean and dry with no bruising or swelling noted at site.  See MAR for details.  Band aid applied.  Patient stable during and after injection.  Vss with discharge and left in satisfactory condition with no s/s of distress noted. All follow ups as scheduled.   Kaithlyn Teagle [employer]

## 2023-05-05 ENCOUNTER — Ambulatory Visit: Payer: 59 | Admitting: Hematology

## 2023-05-05 ENCOUNTER — Inpatient Hospital Stay: Payer: 59

## 2023-05-12 ENCOUNTER — Other Ambulatory Visit: Payer: Self-pay

## 2023-05-20 ENCOUNTER — Inpatient Hospital Stay: Payer: 59 | Attending: Hematology

## 2023-05-20 ENCOUNTER — Inpatient Hospital Stay: Payer: 59

## 2023-05-20 VITALS — BP 131/64 | HR 93 | Temp 98.2°F | Resp 18

## 2023-05-20 VITALS — BP 143/85 | HR 103 | Temp 98.1°F | Resp 19

## 2023-05-20 DIAGNOSIS — C3412 Malignant neoplasm of upper lobe, left bronchus or lung: Secondary | ICD-10-CM | POA: Insufficient documentation

## 2023-05-20 DIAGNOSIS — Z79620 Long term (current) use of immunosuppressive biologic: Secondary | ICD-10-CM | POA: Diagnosis not present

## 2023-05-20 DIAGNOSIS — C3492 Malignant neoplasm of unspecified part of left bronchus or lung: Secondary | ICD-10-CM

## 2023-05-20 DIAGNOSIS — Z5112 Encounter for antineoplastic immunotherapy: Secondary | ICD-10-CM | POA: Insufficient documentation

## 2023-05-20 LAB — CBC WITH DIFFERENTIAL/PLATELET
Abs Immature Granulocytes: 0.02 10*3/uL (ref 0.00–0.07)
Basophils Absolute: 0 10*3/uL (ref 0.0–0.1)
Basophils Relative: 0 %
Eosinophils Absolute: 0.1 10*3/uL (ref 0.0–0.5)
Eosinophils Relative: 2 %
HCT: 31.9 % — ABNORMAL LOW (ref 39.0–52.0)
Hemoglobin: 10.8 g/dL — ABNORMAL LOW (ref 13.0–17.0)
Immature Granulocytes: 0 %
Lymphocytes Relative: 27 %
Lymphs Abs: 1.6 10*3/uL (ref 0.7–4.0)
MCH: 32.9 pg (ref 26.0–34.0)
MCHC: 33.9 g/dL (ref 30.0–36.0)
MCV: 97.3 fL (ref 80.0–100.0)
Monocytes Absolute: 0.8 10*3/uL (ref 0.1–1.0)
Monocytes Relative: 14 %
Neutro Abs: 3.3 10*3/uL (ref 1.7–7.7)
Neutrophils Relative %: 57 %
Platelets: 141 10*3/uL — ABNORMAL LOW (ref 150–400)
RBC: 3.28 MIL/uL — ABNORMAL LOW (ref 4.22–5.81)
RDW: 18.4 % — ABNORMAL HIGH (ref 11.5–15.5)
WBC: 5.8 10*3/uL (ref 4.0–10.5)
nRBC: 0 % (ref 0.0–0.2)

## 2023-05-20 LAB — COMPREHENSIVE METABOLIC PANEL
ALT: 26 U/L (ref 0–44)
AST: 28 U/L (ref 15–41)
Albumin: 3.9 g/dL (ref 3.5–5.0)
Alkaline Phosphatase: 89 U/L (ref 38–126)
Anion gap: 10 (ref 5–15)
BUN: 6 mg/dL — ABNORMAL LOW (ref 8–23)
CO2: 26 mmol/L (ref 22–32)
Calcium: 9.3 mg/dL (ref 8.9–10.3)
Chloride: 102 mmol/L (ref 98–111)
Creatinine, Ser: 0.49 mg/dL — ABNORMAL LOW (ref 0.61–1.24)
GFR, Estimated: >60 mL/min (ref >60)
Glucose, Bld: 92 mg/dL (ref 70–99)
Potassium: 3.8 mmol/L (ref 3.5–5.1)
Sodium: 138 mmol/L (ref 135–145)
Total Bilirubin: 0.4 mg/dL (ref <1.2)
Total Protein: 7.3 g/dL (ref 6.5–8.1)

## 2023-05-20 LAB — TSH: TSH: 0.783 u[IU]/mL (ref 0.350–4.500)

## 2023-05-20 LAB — MAGNESIUM: Magnesium: 1.7 mg/dL (ref 1.7–2.4)

## 2023-05-20 MED ORDER — SODIUM CHLORIDE 0.9% FLUSH
10.0000 mL | Freq: Once | INTRAVENOUS | Status: AC
Start: 2023-05-20 — End: 2023-05-20
  Administered 2023-05-20: 10 mL via INTRAVENOUS

## 2023-05-20 MED ORDER — SODIUM CHLORIDE 0.9 % IV SOLN
1500.0000 mg | Freq: Once | INTRAVENOUS | Status: AC
  Administered 2023-05-20: 1500 mg via INTRAVENOUS
  Filled 2023-05-20: qty 30

## 2023-05-20 MED ORDER — SODIUM CHLORIDE 0.9% FLUSH
10.0000 mL | INTRAVENOUS | Status: DC | PRN
  Administered 2023-05-20: 10 mL

## 2023-05-20 MED ORDER — HEPARIN SOD (PORK) LOCK FLUSH 100 UNIT/ML IV SOLN
500.0000 [IU] | Freq: Once | INTRAVENOUS | Status: AC | PRN
  Administered 2023-05-20: 500 [IU]

## 2023-05-20 MED ORDER — SODIUM CHLORIDE 0.9 % IV SOLN: Freq: Once | INTRAVENOUS | Status: AC

## 2023-05-20 NOTE — Patient Instructions (Signed)
[facility] [place] - A DEPT OF [facility]  Discharge Instructions: Thank you for choosing [facility] to provide your oncology and hematology care.  If you have a lab appointment with the Cancer Center - please note that after April 8th, 2024, all labs will be drawn in the cancer center.  You do not have to check in or register with the main entrance as you have in the past but will complete your check-in in the cancer center.  Wear comfortable clothing and clothing appropriate for easy access to any Portacath or PICC line.   We strive to give you quality time with your provider. You may need to reschedule your appointment if you arrive late (15 or more minutes).  Arriving late affects you and other patients whose appointments are after yours.  Also, if you miss three or more appointments without notifying the office, you may be dismissed from the clinic at the provider's discretion.      For prescription refill requests, have your pharmacy contact our office and allow 72 hours for refills to be completed.    Today you received the following chemotherapy and/or immunotherapy agents Imfinzi.  Durvalumab Injection What is this medication? DURVALUMAB (dur VAL ue mab) treats some types of cancer. It works by helping your immune system slow or stop the spread of cancer cells. It is a monoclonal antibody. This medicine may be used for other purposes; ask your health care provider or pharmacist if you have questions. COMMON BRAND NAME(S): IMFINZI What should I tell my care team before I take this medication? They need to know if you have any of these conditions: Allogeneic stem cell transplant (uses someone else's stem cells) Autoimmune diseases, such as Crohn disease, ulcerative colitis, lupus History of chest radiation Nervous system problems, such as Guillain-Barre syndrome, myasthenia gravis Organ transplant An unusual or allergic reaction to durvalumab,  other medications, foods, dyes, or preservatives Pregnant or trying to get pregnant Breast-feeding How should I use this medication? This medication is infused into a vein. It is given by your care team in a hospital or clinic setting. A special MedGuide will be given to you before each treatment. Be sure to read this information carefully each time. Talk to your care team about the use of this medication in children. Special care may be needed. Overdosage: If you think you have taken too much of this medicine contact a poison control center or emergency room at once. NOTE: This medicine is only for you. Do not share this medicine with others. What if I miss a dose? Keep appointments for follow-up doses. It is important not to miss your dose. Call your care team if you are unable to keep an appointment. What may interact with this medication? Interactions have not been studied. This list may not describe all possible interactions. Give your health care provider a list of all the medicines, herbs, non-prescription drugs, or dietary supplements you use. Also tell them if you smoke, drink alcohol, or use illegal drugs. Some items may interact with your medicine. What should I watch for while using this medication? Your condition will be monitored carefully while you are receiving this medication. You may need blood work while taking this medication. This medication may cause serious skin reactions. They can happen weeks to months after starting the medication. Contact your care team right away if you notice fevers or flu-like symptoms with a rash. The rash may be red or  purple and then turn into blisters or peeling of the skin. You may also notice a red rash with swelling of the face, lips, or lymph nodes in your neck or under your arms. Tell your care team right away if you have any change in your eyesight. Talk to your care team if you may be pregnant. Serious birth defects can occur if you take  this medication during pregnancy and for 3 months after the last dose. You will need a negative pregnancy test before starting this medication. Contraception is recommended while taking this medication and for 3 months after the last dose. Your care team can help you find the option that works for you. Do not breastfeed while taking this medication and for 3 months after the last dose. What side effects may I notice from receiving this medication? Side effects that you should report to your care team as soon as possible: Allergic reactions--skin rash, itching, hives, swelling of the face, lips, tongue, or throat Dry cough, shortness of breath or trouble breathing Eye pain, redness, irritation, or discharge with blurry or decreased vision Heart muscle inflammation--unusual weakness or fatigue, shortness of breath, chest pain, fast or irregular heartbeat, dizziness, swelling of the ankles, feet, or hands Hormone gland problems--headache, sensitivity to light, unusual weakness or fatigue, dizziness, fast or irregular heartbeat, increased sensitivity to cold or heat, excessive sweating, constipation, hair loss, increased thirst or amount of urine, tremors or shaking, irritability Infusion reactions--chest pain, shortness of breath or trouble breathing, feeling faint or lightheaded Kidney injury (glomerulonephritis)--decrease in the amount of urine, red or dark Gavyn Zoss urine, foamy or bubbly urine, swelling of the ankles, hands, or feet Liver injury--right upper belly pain, loss of appetite, nausea, light-colored stool, dark yellow or Sheamus Hasting urine, yellowing skin or eyes, unusual weakness or fatigue Pain, tingling, or numbness in the hands or feet, muscle weakness, change in vision, confusion or trouble speaking, loss of balance or coordination, trouble walking, seizures Rash, fever, and swollen lymph nodes Redness, blistering, peeling, or loosening of the skin, including inside the mouth Sudden or severe  stomach pain, bloody diarrhea, fever, nausea, vomiting Side effects that usually do not require medical attention (report these to your care team if they continue or are bothersome): Bone, joint, or muscle pain Diarrhea Fatigue Loss of appetite Nausea Skin rash This list may not describe all possible side effects. Call your doctor for medical advice about side effects. You may report side effects to FDA at 1-800-FDA-1088. Where should I keep my medication? This medication is given in a hospital or clinic. It will not be stored at home. NOTE: This sheet is a summary. It may not cover all possible information. If you have questions about this medicine, talk to your doctor, pharmacist, or health care provider.  2024 Elsevier/Gold Standard (2021-10-14 00:00:00)        To help prevent nausea and vomiting after your treatment, we encourage you to take your nausea medication as directed.  BELOW ARE SYMPTOMS THAT SHOULD BE REPORTED IMMEDIATELY: *FEVER GREATER THAN 100.4 F (38 C) OR HIGHER *CHILLS OR SWEATING *NAUSEA AND VOMITING THAT IS NOT CONTROLLED WITH YOUR NAUSEA MEDICATION *UNUSUAL SHORTNESS OF BREATH *UNUSUAL BRUISING OR BLEEDING *URINARY PROBLEMS (pain or burning when urinating, or frequent urination) *BOWEL PROBLEMS (unusual diarrhea, constipation, pain near the anus) TENDERNESS IN MOUTH AND THROAT WITH OR WITHOUT PRESENCE OF ULCERS (sore throat, sores in mouth, or a toothache) UNUSUAL RASH, SWELLING OR PAIN  UNUSUAL VAGINAL DISCHARGE OR ITCHING   Items  with * indicate a potential emergency and should be followed up as soon as possible or go to the Emergency Department if any problems should occur.  Please show the CHEMOTHERAPY ALERT CARD or IMMUNOTHERAPY ALERT CARD at check-in to the Emergency Department and triage nurse.  Should you have questions after your visit or need to cancel or reschedule your appointment, please contact [facility] [place] - A DEPT OF [facility] [phone]  and follow the prompts.  Office hours are 8:00 a.m. to 4:30 p.m. Monday - Friday. Please note that voicemails left after 4:00 p.m. may not be returned until the following business day.  We are closed weekends and major holidays. You have access to a nurse at all times for urgent questions. Please call the main number to the clinic [phone] and follow the prompts.  For any non-urgent questions, you may also contact your provider using MyChart. We now offer e-Visits for anyone 54 and older to request care online for non-urgent symptoms. For details visit [email or web address].   Also download the MyChart app! Go to the app store, search "MyChart", open the app, select [place], and log in with your MyChart username and password.

## 2023-05-20 NOTE — Progress Notes (Signed)
Patient presents today for Imfinzi infusion.  Patient is in satisfactory condition with no new complaints voiced.  Vital signs are stable.  Labs reviewed and all labs are within treatment parameters.  We will proceed with treatment per MD orders.    Patient tolerated Imfinzi treatment well with no complaints voiced.  Patient left ambulatory sister in stable condition.  Vital signs stable at discharge.  Follow up as scheduled.

## 2023-05-24 ENCOUNTER — Other Ambulatory Visit: Payer: Self-pay

## 2023-05-27 ENCOUNTER — Other Ambulatory Visit: Payer: Self-pay | Admitting: Internal Medicine

## 2023-05-27 DIAGNOSIS — E782 Mixed hyperlipidemia: Secondary | ICD-10-CM

## 2023-06-01 ENCOUNTER — Other Ambulatory Visit: Payer: Self-pay

## 2023-06-21 ENCOUNTER — Inpatient Hospital Stay: Payer: 59

## 2023-06-21 ENCOUNTER — Inpatient Hospital Stay: Payer: 59 | Attending: Hematology | Admitting: Hematology

## 2023-06-21 ENCOUNTER — Other Ambulatory Visit: Payer: Self-pay | Admitting: Internal Medicine

## 2023-06-21 VITALS — BP 112/61 | HR 87 | Temp 97.1°F | Resp 20

## 2023-06-21 DIAGNOSIS — I2510 Atherosclerotic heart disease of native coronary artery without angina pectoris: Secondary | ICD-10-CM

## 2023-06-21 DIAGNOSIS — Z79620 Long term (current) use of immunosuppressive biologic: Secondary | ICD-10-CM | POA: Insufficient documentation

## 2023-06-21 DIAGNOSIS — C3492 Malignant neoplasm of unspecified part of left bronchus or lung: Secondary | ICD-10-CM

## 2023-06-21 DIAGNOSIS — R112 Nausea with vomiting, unspecified: Secondary | ICD-10-CM | POA: Insufficient documentation

## 2023-06-21 DIAGNOSIS — C3412 Malignant neoplasm of upper lobe, left bronchus or lung: Secondary | ICD-10-CM | POA: Diagnosis not present

## 2023-06-21 DIAGNOSIS — G629 Polyneuropathy, unspecified: Secondary | ICD-10-CM | POA: Insufficient documentation

## 2023-06-21 DIAGNOSIS — I10 Essential (primary) hypertension: Secondary | ICD-10-CM | POA: Insufficient documentation

## 2023-06-21 DIAGNOSIS — Z5112 Encounter for antineoplastic immunotherapy: Secondary | ICD-10-CM | POA: Insufficient documentation

## 2023-06-21 DIAGNOSIS — Z923 Personal history of irradiation: Secondary | ICD-10-CM | POA: Diagnosis not present

## 2023-06-21 DIAGNOSIS — F17210 Nicotine dependence, cigarettes, uncomplicated: Secondary | ICD-10-CM | POA: Diagnosis not present

## 2023-06-21 DIAGNOSIS — E782 Mixed hyperlipidemia: Secondary | ICD-10-CM

## 2023-06-21 LAB — COMPREHENSIVE METABOLIC PANEL
ALT: 31 U/L (ref 0–44)
AST: 28 U/L (ref 15–41)
Albumin: 4 g/dL (ref 3.5–5.0)
Alkaline Phosphatase: 73 U/L (ref 38–126)
Anion gap: 8 (ref 5–15)
BUN: 9 mg/dL (ref 8–23)
CO2: 28 mmol/L (ref 22–32)
Calcium: 9.4 mg/dL (ref 8.9–10.3)
Chloride: 101 mmol/L (ref 98–111)
Creatinine, Ser: 0.49 mg/dL — ABNORMAL LOW (ref 0.61–1.24)
GFR, Estimated: >60 mL/min (ref >60)
Glucose, Bld: 93 mg/dL (ref 70–99)
Potassium: 4 mmol/L (ref 3.5–5.1)
Sodium: 137 mmol/L (ref 135–145)
Total Bilirubin: 0.5 mg/dL (ref 0.0–1.2)
Total Protein: 7.2 g/dL (ref 6.5–8.1)

## 2023-06-21 LAB — CBC WITH DIFFERENTIAL/PLATELET
Abs Immature Granulocytes: 0.01 10*3/uL (ref 0.00–0.07)
Basophils Absolute: 0 10*3/uL (ref 0.0–0.1)
Basophils Relative: 1 %
Eosinophils Absolute: 0.2 10*3/uL (ref 0.0–0.5)
Eosinophils Relative: 3 %
HCT: 37.4 % — ABNORMAL LOW (ref 39.0–52.0)
Hemoglobin: 12 g/dL — ABNORMAL LOW (ref 13.0–17.0)
Immature Granulocytes: 0 %
Lymphocytes Relative: 29 %
Lymphs Abs: 1.7 10*3/uL (ref 0.7–4.0)
MCH: 31.9 pg (ref 26.0–34.0)
MCHC: 32.1 g/dL (ref 30.0–36.0)
MCV: 99.5 fL (ref 80.0–100.0)
Monocytes Absolute: 0.8 10*3/uL (ref 0.1–1.0)
Monocytes Relative: 13 %
Neutro Abs: 3.2 10*3/uL (ref 1.7–7.7)
Neutrophils Relative %: 54 %
Platelets: 221 10*3/uL (ref 150–400)
RBC: 3.76 MIL/uL — ABNORMAL LOW (ref 4.22–5.81)
RDW: 15.9 % — ABNORMAL HIGH (ref 11.5–15.5)
WBC: 5.9 10*3/uL (ref 4.0–10.5)
nRBC: 0 % (ref 0.0–0.2)

## 2023-06-21 LAB — MAGNESIUM: Magnesium: 1.9 mg/dL (ref 1.7–2.4)

## 2023-06-21 LAB — TSH: TSH: 1.534 u[IU]/mL (ref 0.350–4.500)

## 2023-06-21 MED ORDER — SODIUM CHLORIDE 0.9 % IV SOLN: Freq: Once | INTRAVENOUS | Status: AC

## 2023-06-21 MED ORDER — SODIUM CHLORIDE 0.9 % IV SOLN
1500.0000 mg | Freq: Once | INTRAVENOUS | Status: AC
  Administered 2023-06-21: 1500 mg via INTRAVENOUS
  Filled 2023-06-21: qty 30

## 2023-06-21 MED ORDER — HEPARIN SOD (PORK) LOCK FLUSH 100 UNIT/ML IV SOLN
500.0000 [IU] | Freq: Once | INTRAVENOUS | Status: AC | PRN
  Administered 2023-06-21: 500 [IU]

## 2023-06-21 MED ORDER — SODIUM CHLORIDE 0.9% FLUSH
10.0000 mL | INTRAVENOUS | Status: DC | PRN
  Administered 2023-06-21: 10 mL

## 2023-06-21 MED ORDER — SODIUM CHLORIDE 0.9% FLUSH
10.0000 mL | INTRAVENOUS | Status: DC | PRN
  Administered 2023-06-21: 10 mL via INTRAVENOUS

## 2023-06-21 NOTE — Progress Notes (Signed)
 Patient presents today for Imfinzi  infusion with follow up with Dr.Katragadda . Labs pending. Vital signs within parameters for treatment.   Labs within parameters for treatment.   Message received from A. Lenon RN / Dr. Katragadda proceed with treatment.   Imfinzi   given today per MD orders. Tolerated infusion without adverse affects. Vital signs stable. No complaints at this time. Discharged from clinic ambulatory in stable condition. Alert and oriented x 3. F/U with [facility] as scheduled.

## 2023-06-21 NOTE — Progress Notes (Signed)
 [facility] [address] [place] [address]    Clinic Day:  06/24/2023  Referring physician: Melvenia Manus BRAVO, MD  Patient Care Team: Rodney Manus BRAVO, MD as PCP - General (Internal Medicine) Rodney Jayson MATSU, MD as PCP - Cardiology (Cardiology) Rodney Pac, MD as Consulting Physician (Internal Medicine) Rodney Joesph SQUIBB, RN as Oncology Nurse Navigator (Medical Oncology) Rodney Hai, MD as Medical Oncologist (Medical Oncology)   ASSESSMENT & PLAN:   Assessment: 1.  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  No weight loss or hemoptysis. - CT angio chest (11/30/2022): Left upper lobe mass with central necrosis and bulky mediastinal, left hilar and left lower neck adenopathy and lytic lesion in the right superior sternum. - Bronchoscopy (12/01/2022): Narrowing of LUL apical and posterior segment.  Otherwise normal-appearing bronchial trees. - LUL upper lobe biopsy (12/01/2022): Invasive moderately differentiated squamous cell carcinoma. - PET scan (12/16/2022): Left supraclavicular 3.2 cm mass, direct extension from mediastinal adenopathy.  Small right supraclavicular lymph node.  Large left lung mass 7.4 x 6.1 cm extending into the left hilum and AP window.  Prevascular and paratracheal lymph nodes and right paratracheal node.  Extensive involvement of sternum manubrium and first rib close to manubrium.  Soft tissue extension into the medial border of the overlying pectoralis muscle.  Discrete hypermetabolic right upper lobe nodule 10 mm.  No abdominal or pelvic metastatic disease. - He has received 1 dose of concurrent chemo XRT on 12/23/2022. - Left lung XRT 12 Gray in 4 fractions and left bronchus radiation 18 Gray in 6 fractions completed - [employer] (01/07/2023): PIK3CA, T p53, MSI high not detected. - NGS (01/03/2023): TMB-high.  MSI-stable.  PIK3CA and T p53  mutations present.  No other targetable mutations.  PD-L1 TPS 0%.  HER2 IHC 0. - Carboplatin , paclitaxel , durvalumab  and tremelimumab  cycle 1 started on 01/13/2023, cycle 6 completed on 04/28/2023, maintenance Imfinzi  started on 05/20/2023   2.  Social/family history: - Lives at home by himself.  His wife helps him but they are separated.  He is independent of ADLs and IADLs.  Retired from work in April 2024.  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  Current active smoker, 1 pack/day started at age 8 and quit at the time of diagnosis. - Sister had cancer.  2 paternal cousins had cancer.    Plan: 1.  Stage IV (T4 N2 M1) squamous cell lung cancer: - CT CAP on 03/29/2023 showed significant interval response. - He reports decrease in energy levels and has some dyspnea on exertion. - His appetite has improved but taste has not come back.  He has gained some weight. - Reviewed labs today: Normal LFTs and creatinine.  CBC grossly normal.  TSH is 1.5 and normal T4. - Proceed with treatment today.  RTC 4 weeks for follow-up with repeat CT CAP with contrast.   2.  Peripheral neuropathy: - He has fingertip numbness prior to chemotherapy which is stable.  He now has constant numbness in the toes from chemotherapy.  No medical intervention needed since we stopped taxane.   3.  Hypertension: - Continue Toprol -XL 25 mg daily.  Blood pressure is 140/80.   4.  Nausea/vomiting: - This has resolved completely since chemotherapy was stopped.  Stop Zyprexa .    Orders Placed This Encounter  Procedures   CT CHEST ABDOMEN PELVIS W CONTRAST  Standing Status:   Future    Expected Date:   07/22/2023    Expiration Date:   06/20/2024    If indicated for the ordered procedure, I authorize the administration of contrast media per Radiology protocol:   Yes    Does the patient have a contrast media/X-ray dye allergy?:   No    Preferred imaging location?:   [facility]    If indicated for the  ordered procedure, I authorize the administration of oral contrast media per Radiology protocol:   Yes      I,Rodney Mahoney,acting as a scribe for Rodney Stands, MD.,have documented all relevant documentation on the behalf of Rodney Stands, MD,as directed by  Rodney Stands, MD while in the presence of Rodney Stands, MD.   I, Rodney Stands MD, have reviewed the above documentation for accuracy and completeness, and I agree with the above.   Rodney Stands, MD   1/9/20256:38 PM  CHIEF COMPLAINT:   Diagnosis: metastatic squamous cell lung cancer    Cancer Staging  Squamous cell lung cancer, left (HCC) Staging form: Lung, AJCC 8th Edition - Clinical stage from 12/22/2022: Stage IVA (cT4, cN3, cM1a) - Unsigned    Prior Therapy: 1. radiation therapy 12/15/22 - 12/29/22, with one dose carboplatin /taxol  on 12/25/22  2. carboplatin /paclitaxel , durvalumab , tremelimumab , 6 cycles, 01/13/23 - 04/29/23  Current Therapy: maintenance durvalumab    HISTORY OF PRESENT ILLNESS:   Oncology History  Squamous cell lung cancer, left (HCC)  12/22/2022 Initial Diagnosis   Squamous cell lung cancer, left (HCC)   12/25/2022 - 12/25/2022 Chemotherapy   Patient is on Treatment Plan : ESOPHAGUS Carboplatin  + Paclitaxel  Weekly X 6 Weeks with XRT     01/13/2023 -  Chemotherapy   Patient is on Treatment Plan : LUNG NSCLC Squamous Tremelimumab -actl (cycles 1,2,3,4,6) + Durvalumab  + PAClitaxel  + Carboplatin  q21 days x 4 cycles / Durvalumab  q28 days        INTERVAL HISTORY:   Rodney Mahoney is a 63 y.o. male presenting to clinic today for follow up of metastatic squamous cell lung cancer. He was last seen by me on 04/28/23.  Today, he states that he is doing well overall. His appetite level is at 70%. His energy level is at 40%.  PAST MEDICAL HISTORY:   Past Medical History: Past Medical History:  Diagnosis Date   Bronchitis    CAD (coronary artery disease)    a. cath  11/26/2014 EF 45%, moderate to severe inferior hypokinesis, 90% small D1, 100% mid to distal RCA with minimal collateral, 40% mid to distal LCx. Medical therapy as RCA occlusion appears to be completed event; DES to mid circumflex October 2019   Essential hypertension    Hyperlipidemia    Ischemic cardiomyopathy    NSTEMI (non-ST elevated myocardial infarction) (HCC)    11/26/14, 01/31/18   Squamous cell lung cancer, left [facility])     Surgical History: Past Surgical History:  Procedure Laterality Date   BRONCHIAL BIOPSY  12/01/2022   Procedure: BRONCHIAL BIOPSIES;  Surgeon: Kara Dorn NOVAK, MD;  Location: [facility] ENDOSCOPY;  Service: Pulmonary;;   BRONCHIAL BRUSHINGS  12/01/2022   Procedure: BRONCHIAL BRUSHINGS;  Surgeon: Kara Dorn NOVAK, MD;  Location: [facility] ENDOSCOPY;  Service: Pulmonary;;   BRONCHIAL WASHINGS  12/01/2022   Procedure: BRONCHIAL WASHINGS;  Surgeon: Kara Dorn NOVAK, MD;  Location: [facility] ENDOSCOPY;  Service: Pulmonary;;   CARDIAC CATHETERIZATION N/A 11/26/2014   Procedure: Left Heart Cath and Coronary Angiography;  Surgeon: Peter M Jordan, MD;  Location: [facility]  INVASIVE CV LAB;  Service: Cardiovascular;  Laterality: N/A;   COLONOSCOPY     per patient: done in [place], age 4, couple of polyps.   COLONOSCOPY WITH PROPOFOL  N/A 10/08/2022   Procedure: COLONOSCOPY WITH PROPOFOL ;  Surgeon: Cindie Carlin POUR, DO;  Location: [facility] ENDO SUITE;  Service: Endoscopy;  Laterality: N/A;  11:00 am   CORONARY STENT INTERVENTION N/A 01/31/2018   Procedure: CORONARY STENT INTERVENTION;  Surgeon: Court Dorn PARAS, MD;  Location: [facility] INVASIVE CV LAB;  Service: Cardiovascular;  Laterality: N/A;   HEMOSTASIS CONTROL  12/01/2022   Procedure: HEMOSTASIS CONTROL;  Surgeon: Kara Dorn NOVAK, MD;  Location: [facility] ENDOSCOPY;  Service: Pulmonary;;  cold saline   IR IMAGING GUIDED PORT INSERTION  12/23/2022   LEFT HEART CATH AND CORONARY ANGIOGRAPHY N/A 01/31/2018   Procedure: LEFT HEART CATH AND CORONARY ANGIOGRAPHY;  Surgeon:  Court Dorn PARAS, MD;  Location: [facility] INVASIVE CV LAB;  Service: Cardiovascular;  Laterality: N/A;   NECK SURGERY  2005   VIDEO BRONCHOSCOPY N/A 12/01/2022   Procedure: VIDEO BRONCHOSCOPY WITH FLUORO;  Surgeon: Kara Dorn NOVAK, MD;  Location: [facility] ENDOSCOPY;  Service: Pulmonary;  Laterality: N/A;    Social History: Social History   Socioeconomic History   Marital status: Married    Spouse name: Not on file   Number of children: Not on file   Years of education: Not on file   Highest education level: Not on file  Occupational History   Not on file  Tobacco Use   Smoking status: Every Day    Current packs/day: 1.00    Average packs/day: 1 pack/day for 42.0 years (42.0 ttl pk-yrs)    Types: Cigarettes   Smokeless tobacco: Never   Tobacco comments:    He used to quit and start back. He has not quit for the past 5 years.   Vaping Use   Vaping status: Never Used  Substance and Sexual Activity   Alcohol use: Yes    Comment: 10 cans of beer   Drug use: Yes    Types: Marijuana    Comment: smokes week here and there   Sexual activity: Yes    Birth control/protection: None  Other Topics Concern   Not on file  Social History Narrative   Works at [employer] and live with wife.    Social Drivers of Corporate Investment Banker Strain: High Risk (12/31/2022)   Overall Financial Resource Strain (CARDIA)    Difficulty of Paying Living Expenses: Very hard  Food Insecurity: No Food Insecurity (01/20/2023)   Hunger Vital Sign    Worried About Running Out of Food in the Last Year: Never true    Ran Out of Food in the Last Year: Never true  Transportation Needs: No Transportation Needs (01/20/2023)   PRAPARE - Administrator, Civil Service (Medical): No    Lack of Transportation (Non-Medical): No  Physical Activity: Not on file  Stress: Not on file  Social Connections: Not on file  Intimate Partner Violence: Not At Risk (01/20/2023)   Humiliation, Afraid, Rape, and Kick  questionnaire    Fear of Current or Ex-Partner: No    Emotionally Abused: No    Physically Abused: No    Sexually Abused: No    Family History: Family History  Problem Relation Age of Onset   Diabetes Mother    Heart attack Father    Cancer Sister    Hypertension Brother    Colon cancer Neg Hx  Prostate cancer Neg Hx    Lung cancer Neg Hx     Current Medications:  Current Outpatient Medications:    acetaminophen  (TYLENOL ) 500 MG tablet, Take 500 mg by mouth every 6 (six) hours as needed for mild pain., Disp: , Rfl:    albuterol  (VENTOLIN  HFA) 108 (90 Base) MCG/ACT inhaler, Inhale 2 puffs into the lungs every 6 (six) hours as needed for wheezing or shortness of breath., Disp: 18 g, Rfl: 2   aluminum -magnesium  hydroxide 200-200 MG/5ML suspension, Take 10 mLs by mouth every 6 (six) hours as needed for indigestion., Disp: 480 mL, Rfl: 2   aspirin  EC 81 MG tablet, Take 1 tablet (81 mg total) by mouth daily. Swallow whole., Disp: 90 tablet, Rfl: 3   atorvastatin  (LIPITOR ) 80 MG tablet, TAKE 1 TABLET BY MOUTH ONCE DAILY AT 6PM, Disp: 90 tablet, Rfl: 0   CARBOPLATIN  IV, Inject into the vein once a week., Disp: , Rfl:    empagliflozin  (JARDIANCE ) 10 MG TABS tablet, Take 1 tablet (10 mg total) by mouth daily before breakfast., Disp: 30 tablet, Rfl: 3   ezetimibe  (ZETIA ) 10 MG tablet, Take 1 tablet by mouth once daily, Disp: 30 tablet, Rfl: 0   HYDROcodone -acetaminophen  (NORCO) 7.5-325 MG tablet, Take 1 tablet by mouth every 6 (six) hours as needed for moderate pain., Disp: 120 tablet, Rfl: 0   lidocaine -prilocaine  (EMLA ) cream, Apply to affected area once, Disp: 30 g, Rfl: 3   lisinopril  (ZESTRIL ) 10 MG tablet, Take 1 tablet by mouth once daily, Disp: 30 tablet, Rfl: 0   Menthol, Topical Analgesic, (BIOFREEZE EX), Apply 1 Application topically daily as needed (pain)., Disp: , Rfl:    metoprolol  succinate (TOPROL -XL) 25 MG 24 hr tablet, Take 1 tablet (25 mg total) by mouth daily., Disp: 90  tablet, Rfl: 3   nitroGLYCERIN  (NITROSTAT ) 0.4 MG SL tablet, Place 1 tablet (0.4 mg total) under the tongue every 5 (five) minutes as needed., Disp: 25 tablet, Rfl: 0   OLANZapine  (ZYPREXA ) 5 MG tablet, TAKE 1 TABLET BY MOUTH AT BEDTIME, Disp: 30 tablet, Rfl: 0   PACLITAXEL  IV, Inject into the vein once a week., Disp: , Rfl:    prochlorperazine  (COMPAZINE ) 10 MG tablet, Take 1 tablet (10 mg total) by mouth every 6 (six) hours as needed for nausea or vomiting., Disp: 60 tablet, Rfl: 3   sildenafil  (VIAGRA ) 50 MG tablet, TAKE 1 TABLET BY MOUTH ONCE DAILY AS NEEDED FOR ERECTILE DYSFUNCTION, Disp: 4 tablet, Rfl: 0   Allergies: No Known Allergies  REVIEW OF SYSTEMS:   Review of Systems  Constitutional:  Negative for chills, fatigue and fever.  HENT:   Negative for lump/mass, mouth sores, nosebleeds, sore throat and trouble swallowing.   Eyes:  Negative for eye problems.  Respiratory:  Positive for cough and shortness of breath.   Cardiovascular:  Negative for chest pain, leg swelling and palpitations.  Gastrointestinal:  Negative for abdominal pain, constipation, diarrhea, nausea and vomiting.  Genitourinary:  Negative for bladder incontinence, difficulty urinating, dysuria, frequency, hematuria and nocturia.   Musculoskeletal:  Negative for arthralgias, back pain, flank pain, myalgias and neck pain.  Skin:  Negative for itching and rash.  Neurological:  Negative for dizziness, headaches and numbness.  Hematological:  Does not bruise/bleed easily.  Psychiatric/Behavioral:  Negative for depression, sleep disturbance and suicidal ideas. The patient is not nervous/anxious.   All other systems reviewed and are negative.    VITALS:   There were no vitals taken for this visit.  Wt Readings from Last 3 Encounters:  06/21/23 182 lb 12.2 oz (82.9 kg)  04/28/23 172 lb 9.6 oz (78.3 kg)  04/07/23 172 lb 3.2 oz (78.1 kg)    There is no height or weight on file to calculate BMI.  Performance  status (ECOG): 1 - Symptomatic but completely ambulatory  PHYSICAL EXAM:   Physical Exam Vitals and nursing note reviewed. Exam conducted with a chaperone present.  Constitutional:      Appearance: Normal appearance.  Cardiovascular:     Rate and Rhythm: Normal rate and regular rhythm.     Pulses: Normal pulses.     Heart sounds: Normal heart sounds.  Pulmonary:     Effort: Pulmonary effort is normal.     Breath sounds: Normal breath sounds.  Abdominal:     Palpations: Abdomen is soft. There is no hepatomegaly, splenomegaly or mass.     Tenderness: There is no abdominal tenderness.  Musculoskeletal:     Right lower leg: No edema.     Left lower leg: No edema.  Lymphadenopathy:     Cervical: No cervical adenopathy.     Right cervical: No superficial, deep or posterior cervical adenopathy.    Left cervical: No superficial, deep or posterior cervical adenopathy.     Upper Body:     Right upper body: No supraclavicular or axillary adenopathy.     Left upper body: No supraclavicular or axillary adenopathy.  Neurological:     General: No focal deficit present.     Mental Status: He is alert and oriented to person, place, and time.  Psychiatric:        Mood and Affect: Mood normal.        Behavior: Behavior normal.     LABS:   CBC     Component Value Date/Time   WBC 5.9 06/21/2023 1023   RBC 3.76 (L) 06/21/2023 1023   HGB 12.0 (L) 06/21/2023 1023   HGB 13.1 10/28/2022 1501   HCT 37.4 (L) 06/21/2023 1023   HCT 40.1 10/28/2022 1501   PLT 221 06/21/2023 1023   PLT 363 10/28/2022 1501   MCV 99.5 06/21/2023 1023   MCV 90 10/28/2022 1501   MCH 31.9 06/21/2023 1023   MCHC 32.1 06/21/2023 1023   RDW 15.9 (H) 06/21/2023 1023   RDW 13.3 10/28/2022 1501   LYMPHSABS 1.7 06/21/2023 1023   LYMPHSABS 3.2 (H) 10/28/2022 1501   MONOABS 0.8 06/21/2023 1023   EOSABS 0.2 06/21/2023 1023   EOSABS 0.2 10/28/2022 1501   BASOSABS 0.0 06/21/2023 1023   BASOSABS 0.1 10/28/2022 1501     CMP      Component Value Date/Time   NA 137 06/21/2023 1023   NA 137 10/28/2022 1501   K 4.0 06/21/2023 1023   CL 101 06/21/2023 1023   CO2 28 06/21/2023 1023   GLUCOSE 93 06/21/2023 1023   BUN 9 06/21/2023 1023   BUN 10 10/28/2022 1501   CREATININE 0.49 (L) 06/21/2023 1023   CREATININE 0.78 07/04/2021 1606   CALCIUM  9.4 06/21/2023 1023   PROT 7.2 06/21/2023 1023   PROT 7.2 10/28/2022 1501   ALBUMIN 4.0 06/21/2023 1023   ALBUMIN 4.1 10/28/2022 1501   AST 28 06/21/2023 1023   ALT 31 06/21/2023 1023   ALKPHOS 73 06/21/2023 1023   BILITOT 0.5 06/21/2023 1023   BILITOT 0.3 10/28/2022 1501   GFRNONAA >60 06/21/2023 1023   GFRAA >60 02/01/2018 0043     No results found for: CEA1, CEA / No  results found for: CEA1, CEA Lab Results  Component Value Date   PSA1 0.6 07/31/2021   No results found for: CAN199 No results found for: CAN125  No results found for: TOTALPROTELP, ALBUMINELP, A1GS, A2GS, BETS, BETA2SER, GAMS, MSPIKE, SPEI No results found for: TIBC, FERRITIN, IRONPCTSAT No results found for: LDH   STUDIES:   No results found.

## 2023-06-21 NOTE — Patient Instructions (Signed)
 [facility] - A DEPT OF [facility]  Discharge Instructions: Thank you for choosing [facility] to provide your oncology and hematology care.  If you have a lab appointment with the Cancer Center - please note that after April 8th, 2024, all labs will be drawn in the cancer center.  You do not have to check in or register with the main entrance as you have in the past but will complete your check-in in the cancer center.  Wear comfortable clothing and clothing appropriate for easy access to any Portacath or PICC line.   We strive to give you quality time with your provider. You may need to reschedule your appointment if you arrive late (15 or more minutes).  Arriving late affects you and other patients whose appointments are after yours.  Also, if you miss three or more appointments without notifying the office, you may be dismissed from the clinic at the provider's discretion.      For prescription refill requests, have your pharmacy contact our office and allow 72 hours for refills to be completed.    Today you received the following chemotherapy and/or immunotherapy agents Imfinzi.  Durvalumab Injection What is this medication? DURVALUMAB (dur VAL ue mab) treats some types of cancer. It works by helping your immune system slow or stop the spread of cancer cells. It is a monoclonal antibody. This medicine may be used for other purposes; ask your health care provider or pharmacist if you have questions. COMMON BRAND NAME(S): IMFINZI What should I tell my care team before I take this medication? They need to know if you have any of these conditions: Allogeneic stem cell transplant (uses someone else's stem cells) Autoimmune diseases, such as Crohn disease, ulcerative colitis, lupus History of chest radiation Nervous system problems, such as Guillain-Barre syndrome, myasthenia gravis Organ transplant An unusual or allergic reaction to durvalumab,  other medications, foods, dyes, or preservatives Pregnant or trying to get pregnant Breast-feeding How should I use this medication? This medication is infused into a vein. It is given by your care team in a hospital or clinic setting. A special MedGuide will be given to you before each treatment. Be sure to read this information carefully each time. Talk to your care team about the use of this medication in children. Special care may be needed. Overdosage: If you think you have taken too much of this medicine contact a poison control center or emergency room at once. NOTE: This medicine is only for you. Do not share this medicine with others. What if I miss a dose? Keep appointments for follow-up doses. It is important not to miss your dose. Call your care team if you are unable to keep an appointment. What may interact with this medication? Interactions have not been studied. This list may not describe all possible interactions. Give your health care provider a list of all the medicines, herbs, non-prescription drugs, or dietary supplements you use. Also tell them if you smoke, drink alcohol, or use illegal drugs. Some items may interact with your medicine. What should I watch for while using this medication? Your condition will be monitored carefully while you are receiving this medication. You may need blood work while taking this medication. This medication may cause serious skin reactions. They can happen weeks to months after starting the medication. Contact your care team right away if you notice fevers or flu-like symptoms with a rash. The rash may be red or  purple and then turn into blisters or peeling of the skin. You may also notice a red rash with swelling of the face, lips, or lymph nodes in your neck or under your arms. Tell your care team right away if you have any change in your eyesight. Talk to your care team if you may be pregnant. Serious birth defects can occur if you take  this medication during pregnancy and for 3 months after the last dose. You will need a negative pregnancy test before starting this medication. Contraception is recommended while taking this medication and for 3 months after the last dose. Your care team can help you find the option that works for you. Do not breastfeed while taking this medication and for 3 months after the last dose. What side effects may I notice from receiving this medication? Side effects that you should report to your care team as soon as possible: Allergic reactions--skin rash, itching, hives, swelling of the face, lips, tongue, or throat Dry cough, shortness of breath or trouble breathing Eye pain, redness, irritation, or discharge with blurry or decreased vision Heart muscle inflammation--unusual weakness or fatigue, shortness of breath, chest pain, fast or irregular heartbeat, dizziness, swelling of the ankles, feet, or hands Hormone gland problems--headache, sensitivity to light, unusual weakness or fatigue, dizziness, fast or irregular heartbeat, increased sensitivity to cold or heat, excessive sweating, constipation, hair loss, increased thirst or amount of urine, tremors or shaking, irritability Infusion reactions--chest pain, shortness of breath or trouble breathing, feeling faint or lightheaded Kidney injury (glomerulonephritis)--decrease in the amount of urine, red or dark brown urine, foamy or bubbly urine, swelling of the ankles, hands, or feet Liver injury--right upper belly pain, loss of appetite, nausea, light-colored stool, dark yellow or brown urine, yellowing skin or eyes, unusual weakness or fatigue Pain, tingling, or numbness in the hands or feet, muscle weakness, change in vision, confusion or trouble speaking, loss of balance or coordination, trouble walking, seizures Rash, fever, and swollen lymph nodes Redness, blistering, peeling, or loosening of the skin, including inside the mouth Sudden or severe  stomach pain, bloody diarrhea, fever, nausea, vomiting Side effects that usually do not require medical attention (report these to your care team if they continue or are bothersome): Bone, joint, or muscle pain Diarrhea Fatigue Loss of appetite Nausea Skin rash This list may not describe all possible side effects. Call your doctor for medical advice about side effects. You may report side effects to FDA at 1-800-FDA-1088. Where should I keep my medication? This medication is given in a hospital or clinic. It will not be stored at home. NOTE: This sheet is a summary. It may not cover all possible information. If you have questions about this medicine, talk to your doctor, pharmacist, or health care provider.  2024 Elsevier/Gold Standard (2021-10-14 00:00:00)        To help prevent nausea and vomiting after your treatment, we encourage you to take your nausea medication as directed.  BELOW ARE SYMPTOMS THAT SHOULD BE REPORTED IMMEDIATELY: *FEVER GREATER THAN 100.4 F (38 C) OR HIGHER *CHILLS OR SWEATING *NAUSEA AND VOMITING THAT IS NOT CONTROLLED WITH YOUR NAUSEA MEDICATION *UNUSUAL SHORTNESS OF BREATH *UNUSUAL BRUISING OR BLEEDING *URINARY PROBLEMS (pain or burning when urinating, or frequent urination) *BOWEL PROBLEMS (unusual diarrhea, constipation, pain near the anus) TENDERNESS IN MOUTH AND THROAT WITH OR WITHOUT PRESENCE OF ULCERS (sore throat, sores in mouth, or a toothache) UNUSUAL RASH, SWELLING OR PAIN  UNUSUAL VAGINAL DISCHARGE OR ITCHING   Items  with * indicate a potential emergency and should be followed up as soon as possible or go to the Emergency Department if any problems should occur.  Please show the CHEMOTHERAPY ALERT CARD or IMMUNOTHERAPY ALERT CARD at check-in to the Emergency Department and triage nurse.  Should you have questions after your visit or need to cancel or reschedule your appointment, please contact [facility] [place] - A DEPT OF [facility] [phone]  and follow the prompts.  Office hours are 8:00 a.m. to 4:30 p.m. Monday - Friday. Please note that voicemails left after 4:00 p.m. may not be returned until the following business day.  We are closed weekends and major holidays. You have access to a nurse at all times for urgent questions. Please call the main number to the clinic [phone] and follow the prompts.  For any non-urgent questions, you may also contact your provider using MyChart. We now offer e-Visits for anyone 68 and older to request care online for non-urgent symptoms. For details visit [email or web address].   Also download the MyChart app! Go to the app store, search "MyChart", open the app, select [place], and log in with your MyChart username and password.

## 2023-06-21 NOTE — Patient Instructions (Signed)

## 2023-06-21 NOTE — Progress Notes (Signed)
 Patient has been examined by Dr. Ellin Saba. Vital signs and labs have been reviewed by MD - ANC, Creatinine, LFTs, hemoglobin, and platelets are within treatment parameters per M.D. - pt may proceed with treatment.  Primary RN and pharmacy notified.

## 2023-06-22 ENCOUNTER — Other Ambulatory Visit: Payer: Self-pay

## 2023-06-22 LAB — T4: T4, Total: 5 ug/dL (ref 4.5–12.0)

## 2023-06-24 ENCOUNTER — Encounter (HOSPITAL_COMMUNITY): Payer: Self-pay | Admitting: Hematology

## 2023-06-28 ENCOUNTER — Other Ambulatory Visit: Payer: Self-pay | Admitting: Internal Medicine

## 2023-06-28 DIAGNOSIS — E782 Mixed hyperlipidemia: Secondary | ICD-10-CM

## 2023-07-04 ENCOUNTER — Other Ambulatory Visit: Payer: Self-pay

## 2023-07-05 ENCOUNTER — Other Ambulatory Visit: Payer: Self-pay

## 2023-07-09 ENCOUNTER — Ambulatory Visit (HOSPITAL_COMMUNITY)
Admission: RE | Admit: 2023-07-09 | Discharge: 2023-07-09 | Disposition: A | Discharge status: Home / Self Care | Payer: 59 | Source: Ambulatory Visit | Attending: Hematology | Admitting: Hematology

## 2023-07-09 DIAGNOSIS — K5750 Diverticulosis of both small and large intestine without perforation or abscess without bleeding: Secondary | ICD-10-CM | POA: Diagnosis not present

## 2023-07-09 DIAGNOSIS — J439 Emphysema, unspecified: Secondary | ICD-10-CM | POA: Diagnosis not present

## 2023-07-09 DIAGNOSIS — C3492 Malignant neoplasm of unspecified part of left bronchus or lung: Secondary | ICD-10-CM | POA: Diagnosis not present

## 2023-07-09 DIAGNOSIS — N400 Enlarged prostate without lower urinary tract symptoms: Secondary | ICD-10-CM | POA: Diagnosis not present

## 2023-07-09 DIAGNOSIS — R918 Other nonspecific abnormal finding of lung field: Secondary | ICD-10-CM | POA: Diagnosis not present

## 2023-07-09 MED ORDER — IOHEXOL 300 MG/ML  SOLN
100.0000 mL | Freq: Once | INTRAMUSCULAR | Status: AC | PRN
  Administered 2023-07-09: 100 mL via INTRAVENOUS

## 2023-07-18 NOTE — Progress Notes (Signed)
[facility] [address], [place] [address]    Clinic Day:  07/18/2023  Referring physician: Billie Lade, MD  Patient Care Team: Billie Lade, MD as PCP - General (Internal Medicine) [name], MD as PCP - Cardiology (Cardiology) [name], MD as Consulting Physician (Internal Medicine) [name], RN as Oncology Nurse Navigator (Medical Oncology) [name], MD as Medical Oncologist (Medical Oncology)   ASSESSMENT & PLAN:   Assessment: 1.  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  No weight loss or hemoptysis. - CT angio chest (11/30/2022): Left upper lobe mass with central necrosis and bulky mediastinal, left hilar and left lower neck adenopathy and lytic lesion in the right superior sternum. - Bronchoscopy (12/01/2022): Narrowing of LUL apical and posterior segment.  Otherwise normal-appearing bronchial trees. - LUL upper lobe biopsy (12/01/2022): Invasive moderately differentiated squamous cell carcinoma. - PET scan (12/16/2022): Left supraclavicular 3.2 cm mass, direct extension from mediastinal adenopathy.  Small right supraclavicular lymph node.  Large left lung mass 7.4 x 6.1 cm extending into the left hilum and AP window.  Prevascular and paratracheal lymph nodes and right paratracheal node.  Extensive involvement of sternum manubrium and first rib close to manubrium.  Soft tissue extension into the medial border of the overlying pectoralis muscle.  Discrete hypermetabolic right upper lobe nodule 10 mm.  No abdominal or pelvic metastatic disease. - He has received 1 dose of concurrent chemo XRT on 12/23/2022. - Left lung XRT 12 Gray in 4 fractions and left bronchus radiation 18 Gray in 6 fractions completed - [employer] (01/07/2023): PIK3CA, T p53, MSI high not detected. - NGS (01/03/2023): TMB-high.  MSI-stable.  PIK3CA and T p53  mutations present.  No other targetable mutations.  PD-L1 TPS 0%.  HER2 IHC 0. - Carboplatin, paclitaxel, durvalumab and tremelimumab cycle 1 started on 01/13/2023, cycle 6 completed on 04/28/2023, maintenance Imfinzi started on 05/20/2023   2.  Social/family history: - Lives at home by himself.  His wife helps him but they are separated.  He is independent of ADLs and IADLs.  Retired from work in April 2024.  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  Current active smoker, 1 pack/day started at age 39 and quit at the time of diagnosis. - Sister had cancer.  2 paternal cousins had cancer.    Plan: 1.  Stage IV (T4 N2 M1) squamous cell lung cancer: - He is tolerating durvalumab every 4 weeks very well. - CT CAP (07/08/2022): Left upper lobe mass measures 3.6 x 2.4 cm down from 5.4 x 3.1 cm.  No residual mediastinal or hilar adenopathy.  No abdominal pelvic metastatic disease. - Labs today: Normal LFTs creatinine.  CBC grossly normal.  Last TSH is 1.5. - I have showed the images to the patient and compared it with the prior images. - We discussed the goal of treatment which is to control the malignancy as long as possible with minimal side effects.  He will continue durvalumab every 4 weeks indefinitely. - RTC 8 weeks for follow-up.  Will plan on repeating imaging in 4 months.   2.  Peripheral neuropathy: - Fingertip numbness and toe numbness which is constant and stable.   3.  Hypertension: - He will continue Toprol-XL 20 mg daily.  Blood pressure is 134/80.   4.  Wheezing/shortness of breath: - He reported some shortness  of breath on exertion.  Wheezing on auscultation present.  He reports that he has not been using albuterol inhaler consistently.  He was told he is albuterol daily.  We have sent a refill.    No orders of the defined types were placed in this encounter.     Rodney Mahoney,acting as a Neurosurgeon for [name], MD.,have documented all relevant  documentation on the behalf of [name], MD,as directed by  [name], MD while in the presence of [name], MD.  I, [name] MD, have reviewed the above documentation for accuracy and completeness, and I agree with the above.    [name]   2/2/20258:52 PM  CHIEF COMPLAINT:   Diagnosis: metastatic squamous cell lung cancer    Cancer Staging  Squamous cell lung cancer, left (HCC) Staging form: Lung, AJCC 8th Edition - Clinical stage from 12/22/2022: Stage IVA (cT4, cN3, cM1a) - Unsigned    Prior Therapy: 1. radiation therapy 12/15/22 - 12/29/22, with one dose carboplatin/taxol on 12/25/22  2. carboplatin/paclitaxel, durvalumab, tremelimumab, 6 cycles, 01/13/23 - 04/29/23  Current Therapy: maintenance durvalumab   HISTORY OF PRESENT ILLNESS:   Oncology History  Squamous cell lung cancer, left (HCC)  12/22/2022 Initial Diagnosis   Squamous cell lung cancer, left (HCC)   12/25/2022 - 12/25/2022 Chemotherapy   Patient is on Treatment Plan : ESOPHAGUS Carboplatin + Paclitaxel Weekly X 6 Weeks with XRT     01/13/2023 -  Chemotherapy   Patient is on Treatment Plan : LUNG NSCLC Squamous Tremelimumab-actl (cycles 1,2,3,4,6) + Durvalumab + PAClitaxel + Carboplatin q21 days x 4 cycles / Durvalumab q28 days        INTERVAL HISTORY:   Rodney Mahoney is a 63 y.o. male presenting to clinic today for follow up of metastatic squamous cell lung cancer. He was last seen by me on 06/21/23.  Since his last visit, he underwent CT C/A/P on 07/09/23 that found: interval regression in size  of the left upper lobe cavitary mass; no residual or recurrent mediastinal or hilar lymphadenopathy; and no findings for abdominal/pelvic metastatic disease.  Today, he states that he is doing well overall. His appetite level is at 50%. His energy level is at 75%.  PAST MEDICAL HISTORY:   Past Medical History: Past Medical History:  Diagnosis Date   Bronchitis    CAD  (coronary artery disease)    a. cath 11/26/2014 EF 45%, moderate to severe inferior hypokinesis, 90% small D1, 100% mid to distal RCA with minimal collateral, 40% mid to distal LCx. Medical therapy as RCA occlusion appears to be completed event; DES to mid circumflex October 2019   Essential hypertension    Hyperlipidemia    Ischemic cardiomyopathy    NSTEMI (non-ST elevated myocardial infarction) (HCC)    11/26/14, 01/31/18   Squamous cell lung cancer, left [facility])     Surgical History: Past Surgical History:  Procedure Laterality Date   BRONCHIAL BIOPSY  12/01/2022   Procedure: BRONCHIAL BIOPSIES;  Surgeon: Martina Sinner, MD;  Location: [facility] ENDOSCOPY;  Service: Pulmonary;;   BRONCHIAL BRUSHINGS  12/01/2022   Procedure: BRONCHIAL BRUSHINGS;  Surgeon: Martina Sinner, MD;  Location: [facility] ENDOSCOPY;  Service: Pulmonary;;   BRONCHIAL WASHINGS  12/01/2022   Procedure: BRONCHIAL WASHINGS;  Surgeon: Martina Sinner, MD;  Location: [facility] ENDOSCOPY;  Service: Pulmonary;;   CARDIAC CATHETERIZATION N/A 11/26/2014   Procedure: Left Heart Cath and Coronary Angiography;  Surgeon: Peter M Swaziland, MD;  Location: [facility] INVASIVE CV LAB;  Service: Cardiovascular;  Laterality: N/A;   COLONOSCOPY     per patient: done in [place], age 41, couple of polyps.   COLONOSCOPY WITH PROPOFOL N/A 10/08/2022   Procedure: COLONOSCOPY WITH PROPOFOL;  Surgeon: Lanelle Bal, DO;  Location: [facility] ENDO SUITE;  Service: Endoscopy;  Laterality: N/A;  11:00 am   CORONARY STENT INTERVENTION N/A 01/31/2018   Procedure: CORONARY STENT INTERVENTION;  Surgeon: Runell Gess, MD;  Location: [facility] INVASIVE CV LAB;  Service: Cardiovascular;  Laterality: N/A;   HEMOSTASIS CONTROL  12/01/2022   Procedure: HEMOSTASIS CONTROL;  Surgeon: Martina Sinner, MD;  Location: [facility] ENDOSCOPY;  Service: Pulmonary;;  cold saline   IR IMAGING GUIDED PORT INSERTION  12/23/2022   LEFT HEART CATH AND CORONARY ANGIOGRAPHY N/A 01/31/2018   Procedure: LEFT HEART  CATH AND CORONARY ANGIOGRAPHY;  Surgeon: Runell Gess, MD;  Location: [facility] INVASIVE CV LAB;  Service: Cardiovascular;  Laterality: N/A;   NECK SURGERY  2005   VIDEO BRONCHOSCOPY N/A 12/01/2022   Procedure: VIDEO BRONCHOSCOPY WITH FLUORO;  Surgeon: Martina Sinner, MD;  Location: [facility] ENDOSCOPY;  Service: Pulmonary;  Laterality: N/A;    Social History: Social History   Socioeconomic History   Marital status: Married    Spouse name: Not on file   Number of children: Not on file   Years of education: Not on file   Highest education level: Not on file  Occupational History   Not on file  Tobacco Use   Smoking status: Every Day    Current packs/day: 1.00    Average packs/day: 1 pack/day for 42.0 years (42.0 ttl pk-yrs)    Types: Cigarettes   Smokeless tobacco: Never   Tobacco comments:    He used to quit and start back. He has not quit for the past 5 years.   Vaping Use   Vaping status: Never Used  Substance and Sexual Activity   Alcohol use: Yes    Comment: 10 cans of beer   Drug use: Yes    Types: Marijuana    Comment: smokes week here and there   Sexual activity: Yes    Birth control/protection: None  Other Topics Concern   Not on file  Social History Narrative   Works at [employer] and live with wife.    Social Drivers of Corporate investment banker Strain: High Risk (12/31/2022)   Overall Financial Resource Strain (CARDIA)    Difficulty of Paying Living Expenses: Very hard  Food Insecurity: No Food Insecurity (01/20/2023)   Hunger Vital Sign    Worried About Running Out of Food in the Last Year: Never true    Ran Out of Food in the Last Year: Never true  Transportation Needs: No Transportation Needs (01/20/2023)   PRAPARE - Administrator, Civil Service (Medical): No    Lack of Transportation (Non-Medical): No  Physical Activity: Not on file  Stress: Not on file  Social Connections: Not on file  Intimate Partner Violence: Not At Risk (01/20/2023)    Humiliation, Afraid, Rape, and Kick questionnaire    Fear of Current or Ex-Partner: No    Emotionally Abused: No    Physically Abused: No    Sexually Abused: No    Family History: Family History  Problem Relation Age of Onset   Diabetes Mother    Heart attack Father    Cancer Sister    Hypertension Brother    Colon cancer Neg Hx    Prostate cancer Neg  Hx    Lung cancer Neg Hx     Current Medications:  Current Outpatient Medications:    acetaminophen (TYLENOL) 500 MG tablet, Take 500 mg by mouth every 6 (six) hours as needed for mild pain., Disp: , Rfl:    albuterol (VENTOLIN HFA) 108 (90 Base) MCG/ACT inhaler, Inhale 2 puffs into the lungs every 6 (six) hours as needed for wheezing or shortness of breath., Disp: 18 g, Rfl: 2   aluminum-magnesium hydroxide 200-200 MG/5ML suspension, Take 10 mLs by mouth every 6 (six) hours as needed for indigestion., Disp: 480 mL, Rfl: 2   aspirin EC 81 MG tablet, Take 1 tablet (81 mg total) by mouth daily. Swallow whole., Disp: 90 tablet, Rfl: 3   atorvastatin (LIPITOR) 80 MG tablet, TAKE 1 TABLET BY MOUTH ONCE DAILY AT 6PM, Disp: 90 tablet, Rfl: 0   CARBOPLATIN IV, Inject into the vein once a week., Disp: , Rfl:    empagliflozin (JARDIANCE) 10 MG TABS tablet, Take 1 tablet (10 mg total) by mouth daily before breakfast., Disp: 30 tablet, Rfl: 3   ezetimibe (ZETIA) 10 MG tablet, Take 1 tablet by mouth once daily, Disp: 30 tablet, Rfl: 0   HYDROcodone-acetaminophen (NORCO) 7.5-325 MG tablet, Take 1 tablet by mouth every 6 (six) hours as needed for moderate pain., Disp: 120 tablet, Rfl: 0   lidocaine-prilocaine (EMLA) cream, Apply to affected area once, Disp: 30 g, Rfl: 3   lisinopril (ZESTRIL) 10 MG tablet, Take 1 tablet by mouth once daily, Disp: 30 tablet, Rfl: 0   Menthol, Topical Analgesic, (BIOFREEZE EX), Apply 1 Application topically daily as needed (pain)., Disp: , Rfl:    metoprolol succinate (TOPROL-XL) 25 MG 24 hr tablet, Take 1 tablet (25  mg total) by mouth daily., Disp: 90 tablet, Rfl: 3   nitroGLYCERIN (NITROSTAT) 0.4 MG SL tablet, Place 1 tablet (0.4 mg total) under the tongue every 5 (five) minutes as needed., Disp: 25 tablet, Rfl: 0   OLANZapine (ZYPREXA) 5 MG tablet, TAKE 1 TABLET BY MOUTH AT BEDTIME, Disp: 30 tablet, Rfl: 0   PACLITAXEL IV, Inject into the vein once a week., Disp: , Rfl:    prochlorperazine (COMPAZINE) 10 MG tablet, Take 1 tablet (10 mg total) by mouth every 6 (six) hours as needed for nausea or vomiting., Disp: 60 tablet, Rfl: 3   sildenafil (VIAGRA) 50 MG tablet, TAKE 1 TABLET BY MOUTH ONCE DAILY AS NEEDED FOR ERECTILE DYSFUNCTION, Disp: 4 tablet, Rfl: 0   Allergies: No Known Allergies  REVIEW OF SYSTEMS:   Review of Systems  Constitutional:  Negative for chills, fatigue and fever.  HENT:   Negative for lump/mass, mouth sores, nosebleeds, sore throat and trouble swallowing.   Eyes:  Negative for eye problems.  Respiratory:  Positive for cough and shortness of breath.   Cardiovascular:  Positive for palpitations. Negative for chest pain and leg swelling.  Gastrointestinal:  Negative for abdominal pain, constipation, diarrhea, nausea and vomiting.  Genitourinary:  Negative for bladder incontinence, difficulty urinating, dysuria, frequency, hematuria and nocturia.   Musculoskeletal:  Negative for arthralgias, back pain, flank pain, myalgias and neck pain.  Skin:  Negative for itching and rash.  Neurological:  Positive for numbness. Negative for dizziness and headaches.  Hematological:  Does not bruise/bleed easily.  Psychiatric/Behavioral:  Negative for depression, sleep disturbance and suicidal ideas. The patient is not nervous/anxious.   All other systems reviewed and are negative.    VITALS:   There were no vitals taken for this  visit.  Wt Readings from Last 3 Encounters:  06/21/23 182 lb 12.2 oz (82.9 kg)  04/28/23 172 lb 9.6 oz (78.3 kg)  04/07/23 172 lb 3.2 oz (78.1 kg)    There is no  height or weight on file to calculate BMI.  Performance status (ECOG): 1 - Symptomatic but completely ambulatory  PHYSICAL EXAM:   Physical Exam Vitals and nursing note reviewed. Exam conducted with a chaperone present.  Constitutional:      Appearance: Normal appearance.  Cardiovascular:     Rate and Rhythm: Normal rate and regular rhythm.     Pulses: Normal pulses.     Heart sounds: Normal heart sounds.  Pulmonary:     Effort: Pulmonary effort is normal.     Breath sounds: Normal breath sounds.  Abdominal:     Palpations: Abdomen is soft. There is no hepatomegaly, splenomegaly or mass.     Tenderness: There is no abdominal tenderness.  Musculoskeletal:     Right lower leg: No edema.     Left lower leg: No edema.  Lymphadenopathy:     Cervical: No cervical adenopathy.     Right cervical: No superficial, deep or posterior cervical adenopathy.    Left cervical: No superficial, deep or posterior cervical adenopathy.     Upper Body:     Right upper body: No supraclavicular or axillary adenopathy.     Left upper body: No supraclavicular or axillary adenopathy.  Neurological:     General: No focal deficit present.     Mental Status: He is alert and oriented to person, place, and time.  Psychiatric:        Mood and Affect: Mood normal.        Behavior: Behavior normal.     LABS:   CBC     Component Value Date/Time   WBC 5.9 06/21/2023 1023   RBC 3.76 (L) 06/21/2023 1023   HGB 12.0 (L) 06/21/2023 1023   HGB 13.1 10/28/2022 1501   HCT 37.4 (L) 06/21/2023 1023   HCT 40.1 10/28/2022 1501   PLT 221 06/21/2023 1023   PLT 363 10/28/2022 1501   MCV 99.5 06/21/2023 1023   MCV 90 10/28/2022 1501   MCH 31.9 06/21/2023 1023   MCHC 32.1 06/21/2023 1023   RDW 15.9 (H) 06/21/2023 1023   RDW 13.3 10/28/2022 1501   LYMPHSABS 1.7 06/21/2023 1023   LYMPHSABS 3.2 (H) 10/28/2022 1501   MONOABS 0.8 06/21/2023 1023   EOSABS 0.2 06/21/2023 1023   EOSABS 0.2 10/28/2022 1501    BASOSABS 0.0 06/21/2023 1023   BASOSABS 0.1 10/28/2022 1501    CMP      Component Value Date/Time   NA 137 06/21/2023 1023   NA 137 10/28/2022 1501   K 4.0 06/21/2023 1023   CL 101 06/21/2023 1023   CO2 28 06/21/2023 1023   GLUCOSE 93 06/21/2023 1023   BUN 9 06/21/2023 1023   BUN 10 10/28/2022 1501   CREATININE 0.49 (L) 06/21/2023 1023   CREATININE 0.78 07/04/2021 1606   CALCIUM 9.4 06/21/2023 1023   PROT 7.2 06/21/2023 1023   PROT 7.2 10/28/2022 1501   ALBUMIN 4.0 06/21/2023 1023   ALBUMIN 4.1 10/28/2022 1501   AST 28 06/21/2023 1023   ALT 31 06/21/2023 1023   ALKPHOS 73 06/21/2023 1023   BILITOT 0.5 06/21/2023 1023   BILITOT 0.3 10/28/2022 1501   GFRNONAA >60 06/21/2023 1023   GFRAA >60 02/01/2018 0043     No results found for: "CEA1", "CEA" /  No results found for: "CEA1", "CEA" Lab Results  Component Value Date   PSA1 0.6 07/31/2021   No results found for: "AOZ308" No results found for: "CAN125"  No results found for: "TOTALPROTELP", "ALBUMINELP", "A1GS", "A2GS", "BETS", "BETA2SER", "GAMS", "MSPIKE", "SPEI" No results found for: "TIBC", "FERRITIN", "IRONPCTSAT" No results found for: "LDH"   STUDIES:   CT CHEST ABDOMEN PELVIS W CONTRAST Result Date: 07/18/2023 CLINICAL DATA:  Restaging metastatic squamous cell carcinoma. * Tracking Code: BO * EXAM: CT CHEST, ABDOMEN, AND PELVIS WITH CONTRAST TECHNIQUE: Multidetector CT imaging of the chest, abdomen and pelvis was performed following the standard protocol during bolus administration of intravenous contrast. RADIATION DOSE REDUCTION: This exam was performed according to the departmental dose-optimization program which includes automated exposure control, adjustment of the mA and/or kV according to patient size and/or use of iterative reconstruction technique. CONTRAST:  OMNIPAQUE IOHEXOL 300 MG/ML  SOLN COMPARISON:  PET-CT 12/16/2022 and prior recent CT scan 03/29/2023 FINDINGS: CT CHEST FINDINGS Cardiovascular:  The heart is normal in size and stable. No pericardial effusion. Stable age advanced atherosclerotic calcifications involving the aorta and coronary arteries. The right IJ power port is in good position without complicating features. Mediastinum/Nodes: No residual or recurrent mediastinal or hilar lymphadenopathy. The esophagus is grossly normal. Lungs/Pleura: The left upper lobe cavitary mass shows interval regression in size. Measures 3.6 x 2.4 cm and previously measured 5.4 x 3.1 cm. Associated surrounding dense radiation changes are again demonstrated. Peripheral subpleural atelectasis is noted. No new pulmonary lesions or pulmonary nodules. Stable bilateral paramediastinal radiation changes. Stable underlying emphysema and pulmonary scarring. Musculoskeletal: No chest wall mass, supraclavicular or axillary adenopathy. No worrisome bone lesions. Some interval healing changes involving the destructive lesion of the sternal manubrium. No rib or thoracic spine lesions identified. CT ABDOMEN PELVIS FINDINGS Hepatobiliary: No hepatic lesions or intrahepatic biliary dilatation. The gallbladder is contracted. No common bile duct dilatation. Pancreas: Unremarkable. No pancreatic ductal dilatation or surrounding inflammatory changes. Stable forked appearance of the pancreatic tail (normal anatomic variant). Spleen: Normal in size without focal abnormality. Adrenals/Urinary Tract: The adrenal glands and kidneys are unremarkable. No renal lesions or hydronephrosis. The bladder is unremarkable. Stomach/Bowel: The stomach, duodenum, small bowel and colon are grossly normal without oral contrast. No inflammatory changes, mass lesions or obstructive findings. Duodenal diverticulum noted. Scattered colonic diverticulosis. Vascular/Lymphatic: Stable age advanced atherosclerotic calcification involving the aorta and branch vessels but no aneurysm or dissection. The major venous structures are patent. No mesenteric or  retroperitoneal mass or adenopathy. Reproductive: The prostate gland is mildly enlarged. There is asymmetric enlargement of the left seminal vesicle which is likely an anatomic variant and is unchanged since 2016. Other: No pelvic mass or adenopathy. No free pelvic fluid collections. No inguinal mass or adenopathy. No abdominal wall hernia or subcutaneous lesions. Musculoskeletal: No metastatic bone lesions involving the lumbar spine bony pelvis. Stable advanced facet disease in the lower lumbar spine. Age advanced degenerative changes involving both hips. IMPRESSION: 1. Interval regression in size of the left upper lobe cavitary mass. 2. No residual or recurrent mediastinal or hilar lymphadenopathy. 3. No findings for abdominal/pelvic metastatic disease. 4. Some interval healing changes involving the destructive lesion of the sternal manubrium. 5. Stable age advanced vascular disease. Electronically Signed   By: Rudie Meyer M.D.   On: 07/18/2023 14:52

## 2023-07-19 ENCOUNTER — Inpatient Hospital Stay: Payer: 59 | Attending: Hematology

## 2023-07-19 ENCOUNTER — Inpatient Hospital Stay: Payer: 59

## 2023-07-19 ENCOUNTER — Inpatient Hospital Stay (HOSPITAL_BASED_OUTPATIENT_CLINIC_OR_DEPARTMENT_OTHER): Payer: 59 | Admitting: Hematology

## 2023-07-19 VITALS — BP 117/73 | HR 81 | Temp 97.8°F | Resp 18

## 2023-07-19 VITALS — Wt 183.4 lb

## 2023-07-19 VITALS — BP 134/80 | HR 97 | Temp 98.7°F | Resp 20

## 2023-07-19 DIAGNOSIS — Z5112 Encounter for antineoplastic immunotherapy: Secondary | ICD-10-CM | POA: Diagnosis present

## 2023-07-19 DIAGNOSIS — I10 Essential (primary) hypertension: Secondary | ICD-10-CM | POA: Insufficient documentation

## 2023-07-19 DIAGNOSIS — C3492 Malignant neoplasm of unspecified part of left bronchus or lung: Secondary | ICD-10-CM | POA: Diagnosis not present

## 2023-07-19 DIAGNOSIS — C3412 Malignant neoplasm of upper lobe, left bronchus or lung: Secondary | ICD-10-CM | POA: Diagnosis not present

## 2023-07-19 DIAGNOSIS — F17210 Nicotine dependence, cigarettes, uncomplicated: Secondary | ICD-10-CM | POA: Diagnosis not present

## 2023-07-19 DIAGNOSIS — G629 Polyneuropathy, unspecified: Secondary | ICD-10-CM | POA: Diagnosis not present

## 2023-07-19 LAB — COMPREHENSIVE METABOLIC PANEL
ALT: 38 U/L (ref 0–44)
AST: 34 U/L (ref 15–41)
Albumin: 4.1 g/dL (ref 3.5–5.0)
Alkaline Phosphatase: 67 U/L (ref 38–126)
Anion gap: 10 (ref 5–15)
BUN: 10 mg/dL (ref 8–23)
CO2: 26 mmol/L (ref 22–32)
Calcium: 9.3 mg/dL (ref 8.9–10.3)
Chloride: 101 mmol/L (ref 98–111)
Creatinine, Ser: 0.63 mg/dL (ref 0.61–1.24)
GFR, Estimated: >60 mL/min (ref >60)
Glucose, Bld: 111 mg/dL — ABNORMAL HIGH (ref 70–99)
Potassium: 3.7 mmol/L (ref 3.5–5.1)
Sodium: 137 mmol/L (ref 135–145)
Total Bilirubin: 0.7 mg/dL (ref 0.0–1.2)
Total Protein: 7.2 g/dL (ref 6.5–8.1)

## 2023-07-19 LAB — CBC WITH DIFFERENTIAL/PLATELET
Abs Immature Granulocytes: 0.01 10*3/uL (ref 0.00–0.07)
Basophils Absolute: 0 10*3/uL (ref 0.0–0.1)
Basophils Relative: 1 %
Eosinophils Absolute: 0.1 10*3/uL (ref 0.0–0.5)
Eosinophils Relative: 2 %
HCT: 39.4 % (ref 39.0–52.0)
Hemoglobin: 13 g/dL (ref 13.0–17.0)
Immature Granulocytes: 0 %
Lymphocytes Relative: 30 %
Lymphs Abs: 1.8 10*3/uL (ref 0.7–4.0)
MCH: 32.7 pg (ref 26.0–34.0)
MCHC: 33 g/dL (ref 30.0–36.0)
MCV: 99 fL (ref 80.0–100.0)
Monocytes Absolute: 0.7 10*3/uL (ref 0.1–1.0)
Monocytes Relative: 11 %
Neutro Abs: 3.4 10*3/uL (ref 1.7–7.7)
Neutrophils Relative %: 56 %
Platelets: 198 10*3/uL (ref 150–400)
RBC: 3.98 MIL/uL — ABNORMAL LOW (ref 4.22–5.81)
RDW: 14.2 % (ref 11.5–15.5)
WBC: 6 10*3/uL (ref 4.0–10.5)
nRBC: 0 % (ref 0.0–0.2)

## 2023-07-19 LAB — MAGNESIUM: Magnesium: 1.9 mg/dL (ref 1.7–2.4)

## 2023-07-19 MED ORDER — HEPARIN SOD (PORK) LOCK FLUSH 100 UNIT/ML IV SOLN
500.0000 [IU] | Freq: Once | INTRAVENOUS | Status: AC | PRN
  Administered 2023-07-19: 500 [IU]

## 2023-07-19 MED ORDER — SODIUM CHLORIDE 0.9% FLUSH
10.0000 mL | INTRAVENOUS | Status: DC | PRN
  Administered 2023-07-19: 10 mL via INTRAVENOUS

## 2023-07-19 MED ORDER — ALBUTEROL SULFATE HFA 108 (90 BASE) MCG/ACT IN AERS: 2.0000 | INHALATION_SPRAY | Freq: Four times a day (QID) | RESPIRATORY_TRACT | 2 refills | Status: DC | PRN

## 2023-07-19 MED ORDER — HEPARIN SOD (PORK) LOCK FLUSH 100 UNIT/ML IV SOLN: 500.0000 [IU] | Freq: Once | INTRAVENOUS | Status: DC

## 2023-07-19 MED ORDER — SODIUM CHLORIDE 0.9 % IV SOLN: Freq: Once | INTRAVENOUS | Status: AC

## 2023-07-19 MED ORDER — SODIUM CHLORIDE 0.9% FLUSH
10.0000 mL | INTRAVENOUS | Status: DC | PRN
  Administered 2023-07-19: 10 mL

## 2023-07-19 MED ORDER — SODIUM CHLORIDE 0.9 % IV SOLN
1500.0000 mg | Freq: Once | INTRAVENOUS | Status: AC
  Administered 2023-07-19: 1500 mg via INTRAVENOUS
  Filled 2023-07-19: qty 30

## 2023-07-19 NOTE — Progress Notes (Signed)
Patient presents today for Imfinzi infusion. Lab work and vital signs within parameters for treatment. Patient presents today for treatment and follow up with Dr.Katragadda.    Imfinzi given today per MD orders. Tolerated infusion without adverse affects. Vital signs stable. No complaints at this time. Discharged from clinic ambulatory in stable condition. Alert and oriented x 3. F/U with [facility] as scheduled.

## 2023-07-19 NOTE — Patient Instructions (Signed)
 [facility] - A DEPT OF [facility]  Discharge Instructions: Thank you for choosing [facility] to provide your oncology and hematology care.  If you have a lab appointment with the Cancer Center - please note that after April 8th, 2024, all labs will be drawn in the cancer center.  You do not have to check in or register with the main entrance as you have in the past but will complete your check-in in the cancer center.  Wear comfortable clothing and clothing appropriate for easy access to any Portacath or PICC line.   We strive to give you quality time with your provider. You may need to reschedule your appointment if you arrive late (15 or more minutes).  Arriving late affects you and other patients whose appointments are after yours.  Also, if you miss three or more appointments without notifying the office, you may be dismissed from the clinic at the provider's discretion.      For prescription refill requests, have your pharmacy contact our office and allow 72 hours for refills to be completed.    Today you received the following chemotherapy and/or immunotherapy agents Imfinzi.  Durvalumab Injection What is this medication? DURVALUMAB (dur VAL ue mab) treats some types of cancer. It works by helping your immune system slow or stop the spread of cancer cells. It is a monoclonal antibody. This medicine may be used for other purposes; ask your health care provider or pharmacist if you have questions. COMMON BRAND NAME(S): IMFINZI What should I tell my care team before I take this medication? They need to know if you have any of these conditions: Allogeneic stem cell transplant (uses someone else's stem cells) Autoimmune diseases, such as Crohn disease, ulcerative colitis, lupus History of chest radiation Nervous system problems, such as Guillain-Barre syndrome, myasthenia gravis Organ transplant An unusual or allergic reaction to durvalumab,  other medications, foods, dyes, or preservatives Pregnant or trying to get pregnant Breast-feeding How should I use this medication? This medication is infused into a vein. It is given by your care team in a hospital or clinic setting. A special MedGuide will be given to you before each treatment. Be sure to read this information carefully each time. Talk to your care team about the use of this medication in children. Special care may be needed. Overdosage: If you think you have taken too much of this medicine contact a poison control center or emergency room at once. NOTE: This medicine is only for you. Do not share this medicine with others. What if I miss a dose? Keep appointments for follow-up doses. It is important not to miss your dose. Call your care team if you are unable to keep an appointment. What may interact with this medication? Interactions have not been studied. This list may not describe all possible interactions. Give your health care provider a list of all the medicines, herbs, non-prescription drugs, or dietary supplements you use. Also tell them if you smoke, drink alcohol, or use illegal drugs. Some items may interact with your medicine. What should I watch for while using this medication? Your condition will be monitored carefully while you are receiving this medication. You may need blood work while taking this medication. This medication may cause serious skin reactions. They can happen weeks to months after starting the medication. Contact your care team right away if you notice fevers or flu-like symptoms with a rash. The rash may be red or  purple and then turn into blisters or peeling of the skin. You may also notice a red rash with swelling of the face, lips, or lymph nodes in your neck or under your arms. Tell your care team right away if you have any change in your eyesight. Talk to your care team if you may be pregnant. Serious birth defects can occur if you take  this medication during pregnancy and for 3 months after the last dose. You will need a negative pregnancy test before starting this medication. Contraception is recommended while taking this medication and for 3 months after the last dose. Your care team can help you find the option that works for you. Do not breastfeed while taking this medication and for 3 months after the last dose. What side effects may I notice from receiving this medication? Side effects that you should report to your care team as soon as possible: Allergic reactions--skin rash, itching, hives, swelling of the face, lips, tongue, or throat Dry cough, shortness of breath or trouble breathing Eye pain, redness, irritation, or discharge with blurry or decreased vision Heart muscle inflammation--unusual weakness or fatigue, shortness of breath, chest pain, fast or irregular heartbeat, dizziness, swelling of the ankles, feet, or hands Hormone gland problems--headache, sensitivity to light, unusual weakness or fatigue, dizziness, fast or irregular heartbeat, increased sensitivity to cold or heat, excessive sweating, constipation, hair loss, increased thirst or amount of urine, tremors or shaking, irritability Infusion reactions--chest pain, shortness of breath or trouble breathing, feeling faint or lightheaded Kidney injury (glomerulonephritis)--decrease in the amount of urine, red or dark brown urine, foamy or bubbly urine, swelling of the ankles, hands, or feet Liver injury--right upper belly pain, loss of appetite, nausea, light-colored stool, dark yellow or brown urine, yellowing skin or eyes, unusual weakness or fatigue Pain, tingling, or numbness in the hands or feet, muscle weakness, change in vision, confusion or trouble speaking, loss of balance or coordination, trouble walking, seizures Rash, fever, and swollen lymph nodes Redness, blistering, peeling, or loosening of the skin, including inside the mouth Sudden or severe  stomach pain, bloody diarrhea, fever, nausea, vomiting Side effects that usually do not require medical attention (report these to your care team if they continue or are bothersome): Bone, joint, or muscle pain Diarrhea Fatigue Loss of appetite Nausea Skin rash This list may not describe all possible side effects. Call your doctor for medical advice about side effects. You may report side effects to FDA at 1-800-FDA-1088. Where should I keep my medication? This medication is given in a hospital or clinic. It will not be stored at home. NOTE: This sheet is a summary. It may not cover all possible information. If you have questions about this medicine, talk to your doctor, pharmacist, or health care provider.  2024 Elsevier/Gold Standard (2021-10-14 00:00:00)        To help prevent nausea and vomiting after your treatment, we encourage you to take your nausea medication as directed.  BELOW ARE SYMPTOMS THAT SHOULD BE REPORTED IMMEDIATELY: *FEVER GREATER THAN 100.4 F (38 C) OR HIGHER *CHILLS OR SWEATING *NAUSEA AND VOMITING THAT IS NOT CONTROLLED WITH YOUR NAUSEA MEDICATION *UNUSUAL SHORTNESS OF BREATH *UNUSUAL BRUISING OR BLEEDING *URINARY PROBLEMS (pain or burning when urinating, or frequent urination) *BOWEL PROBLEMS (unusual diarrhea, constipation, pain near the anus) TENDERNESS IN MOUTH AND THROAT WITH OR WITHOUT PRESENCE OF ULCERS (sore throat, sores in mouth, or a toothache) UNUSUAL RASH, SWELLING OR PAIN  UNUSUAL VAGINAL DISCHARGE OR ITCHING   Items  with * indicate a potential emergency and should be followed up as soon as possible or go to the Emergency Department if any problems should occur.  Please show the CHEMOTHERAPY ALERT CARD or IMMUNOTHERAPY ALERT CARD at check-in to the Emergency Department and triage nurse.  Should you have questions after your visit or need to cancel or reschedule your appointment, please contact [facility] [place] - A DEPT OF [facility] [phone]  and follow the prompts.  Office hours are 8:00 a.m. to 4:30 p.m. Monday - Friday. Please note that voicemails left after 4:00 p.m. may not be returned until the following business day.  We are closed weekends and major holidays. You have access to a nurse at all times for urgent questions. Please call the main number to the clinic [phone] and follow the prompts.  For any non-urgent questions, you may also contact your provider using MyChart. We now offer e-Visits for anyone 68 and older to request care online for non-urgent symptoms. For details visit [email or web address].   Also download the MyChart app! Go to the app store, search "MyChart", open the app, select [place], and log in with your MyChart username and password.

## 2023-07-19 NOTE — Patient Instructions (Addendum)
[facility] at [facility] Discharge Instructions   You were seen and examined today by Dr. Ellin Saba.  He reviewed the results of your lab work which are normal/stable.   He reviewed the results of your CT scan. It is showing the tumor in your left lung is continuing to shrink and there is no evidence of the lymph nodes in your chest any more. The cancer has not spread anywhere in your body.  We will proceed with your treatment today.   Return as scheduled.    Thank you for choosing [facility] at [facility] to provide your oncology and hematology care.  To afford each patient quality time with our provider, please arrive at least 15 minutes before your scheduled appointment time.   If you have a lab appointment with the Cancer Center please come in thru the Main Entrance and check in at the main information desk.  You need to re-schedule your appointment should you arrive 10 or more minutes late.  We strive to give you quality time with our providers, and arriving late affects you and other patients whose appointments are after yours.  Also, if you no show three or more times for appointments you may be dismissed from the clinic at the providers discretion.     Again, thank you for choosing [facility].  Our hope is that these requests will decrease the amount of time that you wait before being seen by our physicians.       _____________________________________________________________  Should you have questions after your visit to [facility], please contact our office at [phone] and follow the prompts.  Our office hours are 8:00 a.m. and 4:30 p.m. Monday - Friday.  Please note that voicemails left after 4:00 p.m. may not be returned until the following business day.  We are closed weekends and major holidays.  You do have access to a nurse 24-7, just call the main number to the clinic [phone] and do not  press any options, hold on the line and a nurse will answer the phone.    For prescription refill requests, have your pharmacy contact our office and allow 72 hours.    Due to Covid, you will need to wear a mask upon entering the hospital. If you do not have a mask, a mask will be given to you at the Main Entrance upon arrival. For doctor visits, patients may have 1 support person age 60 or older with them. For treatment visits, patients can not have anyone with them due to social distancing guidelines and our immunocompromised population.

## 2023-07-20 ENCOUNTER — Other Ambulatory Visit: Payer: Self-pay

## 2023-08-03 ENCOUNTER — Other Ambulatory Visit: Payer: Self-pay

## 2023-08-05 ENCOUNTER — Other Ambulatory Visit: Payer: Self-pay | Admitting: Internal Medicine

## 2023-08-05 DIAGNOSIS — E782 Mixed hyperlipidemia: Secondary | ICD-10-CM

## 2023-08-16 ENCOUNTER — Inpatient Hospital Stay: Payer: 59

## 2023-08-16 ENCOUNTER — Inpatient Hospital Stay: Payer: 59 | Attending: Hematology

## 2023-08-16 ENCOUNTER — Inpatient Hospital Stay

## 2023-08-16 ENCOUNTER — Inpatient Hospital Stay: Payer: 59 | Admitting: Hematology

## 2023-08-16 VITALS — BP 134/70 | HR 91 | Resp 18

## 2023-08-16 DIAGNOSIS — Z79620 Long term (current) use of immunosuppressive biologic: Secondary | ICD-10-CM | POA: Insufficient documentation

## 2023-08-16 DIAGNOSIS — Z5112 Encounter for antineoplastic immunotherapy: Secondary | ICD-10-CM | POA: Diagnosis present

## 2023-08-16 DIAGNOSIS — C3412 Malignant neoplasm of upper lobe, left bronchus or lung: Secondary | ICD-10-CM | POA: Diagnosis not present

## 2023-08-16 DIAGNOSIS — C3492 Malignant neoplasm of unspecified part of left bronchus or lung: Secondary | ICD-10-CM

## 2023-08-16 LAB — COMPREHENSIVE METABOLIC PANEL
ALT: 28 U/L (ref 0–44)
AST: 30 U/L (ref 15–41)
Albumin: 4.1 g/dL (ref 3.5–5.0)
Alkaline Phosphatase: 73 U/L (ref 38–126)
Anion gap: 12 (ref 5–15)
BUN: 8 mg/dL (ref 8–23)
CO2: 26 mmol/L (ref 22–32)
Calcium: 9.3 mg/dL (ref 8.9–10.3)
Chloride: 100 mmol/L (ref 98–111)
Creatinine, Ser: 0.68 mg/dL (ref 0.61–1.24)
GFR, Estimated: >60 mL/min (ref >60)
Glucose, Bld: 101 mg/dL — ABNORMAL HIGH (ref 70–99)
Potassium: 3.8 mmol/L (ref 3.5–5.1)
Sodium: 138 mmol/L (ref 135–145)
Total Bilirubin: 0.9 mg/dL (ref 0.0–1.2)
Total Protein: 7.4 g/dL (ref 6.5–8.1)

## 2023-08-16 LAB — CBC
HCT: 42.2 % (ref 39.0–52.0)
Hemoglobin: 13.8 g/dL (ref 13.0–17.0)
MCH: 31.7 pg (ref 26.0–34.0)
MCHC: 32.7 g/dL (ref 30.0–36.0)
MCV: 96.8 fL (ref 80.0–100.0)
Platelets: 207 10*3/uL (ref 150–400)
RBC: 4.36 MIL/uL (ref 4.22–5.81)
RDW: 13.3 % (ref 11.5–15.5)
WBC: 5.5 10*3/uL (ref 4.0–10.5)
nRBC: 0 % (ref 0.0–0.2)

## 2023-08-16 LAB — DIFFERENTIAL
Abs Immature Granulocytes: 0.01 10*3/uL (ref 0.00–0.07)
Basophils Absolute: 0 10*3/uL (ref 0.0–0.1)
Basophils Relative: 1 %
Eosinophils Absolute: 0.1 10*3/uL (ref 0.0–0.5)
Eosinophils Relative: 2 %
Immature Granulocytes: 0 %
Lymphocytes Relative: 31 %
Lymphs Abs: 1.7 10*3/uL (ref 0.7–4.0)
Monocytes Absolute: 0.6 10*3/uL (ref 0.1–1.0)
Monocytes Relative: 11 %
Neutro Abs: 3.1 10*3/uL (ref 1.7–7.7)
Neutrophils Relative %: 55 %

## 2023-08-16 LAB — MAGNESIUM: Magnesium: 1.8 mg/dL (ref 1.7–2.4)

## 2023-08-16 LAB — TSH: TSH: 1.711 u[IU]/mL (ref 0.350–4.500)

## 2023-08-16 MED ORDER — HEPARIN SOD (PORK) LOCK FLUSH 100 UNIT/ML IV SOLN
500.0000 [IU] | Freq: Once | INTRAVENOUS | Status: AC | PRN
Start: 2023-08-16 — End: 2023-08-16
  Administered 2023-08-16: 500 [IU]

## 2023-08-16 MED ORDER — SODIUM CHLORIDE 0.9% FLUSH
10.0000 mL | INTRAVENOUS | Status: DC | PRN
  Administered 2023-08-16: 10 mL

## 2023-08-16 MED ORDER — SODIUM CHLORIDE 0.9 % IV SOLN: Freq: Once | INTRAVENOUS | Status: AC

## 2023-08-16 MED ORDER — SODIUM CHLORIDE 0.9 % IV SOLN
1500.0000 mg | Freq: Once | INTRAVENOUS | Status: AC
  Administered 2023-08-16: 1500 mg via INTRAVENOUS
  Filled 2023-08-16: qty 30

## 2023-08-16 NOTE — Progress Notes (Signed)
Patient presents today for Imfinzi infusion per providers order.  Vital signs and labs within parameters for treatment.  Patient has no new complaints at this time.  Treatment given today per MD orders.  Stable during infusion without adverse affects.  Vital signs stable.  No complaints at this time.  Discharge from clinic ambulatory in stable condition.  Alert and oriented X 3.  Follow up with [facility] as scheduled.

## 2023-08-16 NOTE — Patient Instructions (Addendum)
 [facility] - A DEPT OF [facility]  Discharge Instructions: Thank you for choosing [facility] to provide your oncology and hematology care.  If you have a lab appointment with the Cancer Center - please note that after April 8th, 2024, all labs will be drawn in the cancer center.  You do not have to check in or register with the main entrance as you have in the past but will complete your check-in in the cancer center.  Wear comfortable clothing and clothing appropriate for easy access to any Portacath or PICC line.   We strive to give you quality time with your provider. You may need to reschedule your appointment if you arrive late (15 or more minutes).  Arriving late affects you and other patients whose appointments are after yours.  Also, if you miss three or more appointments without notifying the office, you may be dismissed from the clinic at the provider's discretion.      For prescription refill requests, have your pharmacy contact our office and allow 72 hours for refills to be completed.    Today you received the following chemotherapy and/or immunotherapy agents Imfinzi      To help prevent nausea and vomiting after your treatment, we encourage you to take your nausea medication as directed.  BELOW ARE SYMPTOMS THAT SHOULD BE REPORTED IMMEDIATELY: *FEVER GREATER THAN 100.4 F (38 C) OR HIGHER *CHILLS OR SWEATING *NAUSEA AND VOMITING THAT IS NOT CONTROLLED WITH YOUR NAUSEA MEDICATION *UNUSUAL SHORTNESS OF BREATH *UNUSUAL BRUISING OR BLEEDING *URINARY PROBLEMS (pain or burning when urinating, or frequent urination) *BOWEL PROBLEMS (unusual diarrhea, constipation, pain near the anus) TENDERNESS IN MOUTH AND THROAT WITH OR WITHOUT PRESENCE OF ULCERS (sore throat, sores in mouth, or a toothache) UNUSUAL RASH, SWELLING OR PAIN  UNUSUAL VAGINAL DISCHARGE OR ITCHING   Items with * indicate a potential emergency and should be followed up  as soon as possible or go to the Emergency Department if any problems should occur.  Please show the CHEMOTHERAPY ALERT CARD or IMMUNOTHERAPY ALERT CARD at check-in to the Emergency Department and triage nurse.  Should you have questions after your visit or need to cancel or reschedule your appointment, please contact [facility] [place] - A DEPT OF [facility] [phone]  and follow the prompts.  Office hours are 8:00 a.m. to 4:30 p.m. Monday - Friday. Please note that voicemails left after 4:00 p.m. may not be returned until the following business day.  We are closed weekends and major holidays. You have access to a nurse at all times for urgent questions. Please call the main number to the clinic [phone] and follow the prompts.  For any non-urgent questions, you may also contact your provider using MyChart. We now offer e-Visits for anyone 42 and older to request care online for non-urgent symptoms. For details visit [email or web address].   Also download the MyChart app! Go to the app store, search "MyChart", open the app, select [place], and log in with your MyChart username and password.

## 2023-08-19 ENCOUNTER — Other Ambulatory Visit: Payer: Self-pay | Admitting: Internal Medicine

## 2023-08-19 DIAGNOSIS — N529 Male erectile dysfunction, unspecified: Secondary | ICD-10-CM

## 2023-08-29 ENCOUNTER — Other Ambulatory Visit: Payer: Self-pay

## 2023-08-30 ENCOUNTER — Other Ambulatory Visit: Payer: Self-pay

## 2023-09-06 ENCOUNTER — Other Ambulatory Visit: Payer: Self-pay | Admitting: Internal Medicine

## 2023-09-06 DIAGNOSIS — E782 Mixed hyperlipidemia: Secondary | ICD-10-CM

## 2023-09-12 NOTE — Progress Notes (Signed)
 [facility] [address] [place] [address]    Clinic Day:  09/13/2023  Referring physician: Billie Lade, MD  Patient Care Team: Billie Lade, MD as PCP - General (Internal Medicine) [name], MD as PCP - Cardiology (Cardiology) [name], MD as Consulting Physician (Internal Medicine) [name], RN as Oncology Nurse Navigator (Medical Oncology) [name], MD as Medical Oncologist (Medical Oncology)   ASSESSMENT & PLAN:   Assessment: 1.  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  No weight loss or hemoptysis. - CT angio chest (11/30/2022): Left upper lobe mass with central necrosis and bulky mediastinal, left hilar and left lower neck adenopathy and lytic lesion in the right superior sternum. - Bronchoscopy (12/01/2022): Narrowing of LUL apical and posterior segment.  Otherwise normal-appearing bronchial trees. - LUL upper lobe biopsy (12/01/2022): Invasive moderately differentiated squamous cell carcinoma. - PET scan (12/16/2022): Left supraclavicular 3.2 cm mass, direct extension from mediastinal adenopathy.  Small right supraclavicular lymph node.  Large left lung mass 7.4 x 6.1 cm extending into the left hilum and AP window.  Prevascular and paratracheal lymph nodes and right paratracheal node.  Extensive involvement of sternum manubrium and first rib close to manubrium.  Soft tissue extension into the medial border of the overlying pectoralis muscle.  Discrete hypermetabolic right upper lobe nodule 10 mm.  No abdominal or pelvic metastatic disease. - He has received 1 dose of concurrent chemo XRT on 12/23/2022. - Left lung XRT 12 Gray in 4 fractions and left bronchus radiation 18 Gray in 6 fractions completed - [employer] (01/07/2023): PIK3CA, T p53, MSI high not detected. - NGS (01/03/2023): TMB-high.  MSI-stable.  PIK3CA and T p53  mutations present.  No other targetable mutations.  PD-L1 TPS 0%.  HER2 IHC 0. - Carboplatin, paclitaxel, durvalumab and tremelimumab cycle 1 started on 01/13/2023, cycle 6 completed on 04/28/2023, maintenance Imfinzi started on 05/20/2023   2.  Social/family history: - Lives at home by himself.  His wife helps him but they are separated.  He is independent of ADLs and IADLs.  Retired from work in April 2024.  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  Current active smoker, 1 pack/day started at age 78 and quit at the time of diagnosis. - Sister had cancer.  2 paternal cousins had cancer.    Plan: 1.  Stage IV (T4 N2 M1) squamous cell lung cancer: - CT CAP (07/09/2023): Left upper lobe lung mass measures 3.6 x 2.4 cm, down from 5.4 x 3.1 cm.  No residual mediastinal or hilar adenopathy.  No abdominal or pelvic metastatic disease. - He is tolerating durvalumab reasonably well.  No immunotherapy related side effects noted. - He reports decreased taste sensation but he is eating well and maintaining his weight. - Labs today: AST elevated at 42.  Rest of LFTs are normal.  CBC was normal.  TSH is 2.2. - He will continue durvalumab 1500 mg today.  Will obtain CT chest prior to next visit in 4 weeks.  Will continue Durvalumab indefinitely as long as he continues to have response or stable disease.    2.  Peripheral neuropathy: - Fingertip numbness and toe numbness is constant and stable.   3.  Hypertension: - Continue Toprol-XL 20 mg daily.  Blood pressure is 150/80.   4.  Wheezing/shortness of breath: - He is not using  albuterol inhaler regularly. - He reports slight worsening of cough with clear expectoration.  On auscultation he has wheezing on the right lung.  He was told to use inhaler consistently.    Orders Placed This Encounter  Procedures   CT CHEST W CONTRAST    Standing Status:   Future    Expected Date:   10/13/2023    Expiration Date:   09/12/2024    If indicated for  the ordered procedure, I authorize the administration of contrast media per Radiology protocol:   Yes    Does the patient have a contrast media/X-ray dye allergy?:   No    Preferred imaging location?:   [facility]      I,Katie Daubenspeck,acting as a scribe for [name], MD.,have documented all relevant documentation on the behalf of [name], MD,as directed by  [name], MD while in the presence of [name], MD.   I, [name] MD, have reviewed the above documentation for accuracy and completeness, and I agree with the above.   [name], MD   3/31/202512:13 PM  CHIEF COMPLAINT:   Diagnosis: metastatic squamous cell lung cancer    Cancer Staging  Squamous cell lung cancer, left [facility]) Staging form: Lung, AJCC 8th Edition - Clinical stage from 12/22/2022: Stage IVA (cT4, cN3, cM1a) - Unsigned    Prior Therapy: 1. radiation therapy 12/15/22 - 12/29/22, with one dose carboplatin/taxol on 12/25/22  2. carboplatin/paclitaxel, durvalumab, tremelimumab, 6 cycles, 01/13/23 - 04/29/23  Current Therapy:  maintenance durvalumab    HISTORY OF PRESENT ILLNESS:   Oncology History  Squamous cell lung cancer, left (HCC)  12/22/2022 Initial Diagnosis   Squamous cell lung cancer, left (HCC)   12/25/2022 - 12/25/2022 Chemotherapy   Patient is on Treatment Plan : ESOPHAGUS Carboplatin + Paclitaxel Weekly X 6 Weeks with XRT     01/13/2023 -  Chemotherapy   Patient is on Treatment Plan : LUNG NSCLC Squamous Tremelimumab-actl (cycles 1,2,3,4,6) + Durvalumab + PAClitaxel + Carboplatin q21 days x 4 cycles / Durvalumab q28 days        INTERVAL HISTORY:   Rodney Mahoney is a 63 y.o. male presenting to clinic today for follow up of metastatic squamous cell lung cancer. He was last seen by me on 07/19/23.  Today, he states that he is doing well overall. His appetite level is at 90%. His energy level is at 50%.  PAST MEDICAL HISTORY:   Past  Medical History: Past Medical History:  Diagnosis Date   Bronchitis    CAD (coronary artery disease)    a. cath 11/26/2014 EF 45%, moderate to severe inferior hypokinesis, 90% small D1, 100% mid to distal RCA with minimal collateral, 40% mid to distal LCx. Medical therapy as RCA occlusion appears to be completed event; DES to mid circumflex October 2019   Essential hypertension    Hyperlipidemia    Ischemic cardiomyopathy    NSTEMI (non-ST elevated myocardial infarction) (HCC)    11/26/14, 01/31/18   Squamous cell lung cancer, left [facility])     Surgical History: Past Surgical History:  Procedure Laterality Date   BRONCHIAL BIOPSY  12/01/2022   Procedure: BRONCHIAL BIOPSIES;  Surgeon: Martina Sinner, MD;  Location: [facility] ENDOSCOPY;  Service: Pulmonary;;   BRONCHIAL BRUSHINGS  12/01/2022   Procedure: BRONCHIAL BRUSHINGS;  Surgeon: Martina Sinner, MD;  Location: [facility] ENDOSCOPY;  Service: Pulmonary;;   BRONCHIAL WASHINGS  12/01/2022   Procedure: BRONCHIAL WASHINGS;  Surgeon: Martina Sinner, MD;  Location: [facility]  ENDOSCOPY;  Service: Pulmonary;;   CARDIAC CATHETERIZATION N/A 11/26/2014   Procedure: Left Heart Cath and Coronary Angiography;  Surgeon: Peter M Swaziland, MD;  Location: [facility] INVASIVE CV LAB;  Service: Cardiovascular;  Laterality: N/A;   COLONOSCOPY     per patient: done in [place], age 33, couple of polyps.   COLONOSCOPY WITH PROPOFOL N/A 10/08/2022   Procedure: COLONOSCOPY WITH PROPOFOL;  Surgeon: Lanelle Bal, DO;  Location: [facility] ENDO SUITE;  Service: Endoscopy;  Laterality: N/A;  11:00 am   CORONARY STENT INTERVENTION N/A 01/31/2018   Procedure: CORONARY STENT INTERVENTION;  Surgeon: Runell Gess, MD;  Location: [facility] INVASIVE CV LAB;  Service: Cardiovascular;  Laterality: N/A;   HEMOSTASIS CONTROL  12/01/2022   Procedure: HEMOSTASIS CONTROL;  Surgeon: Martina Sinner, MD;  Location: [facility] ENDOSCOPY;  Service: Pulmonary;;  cold saline   IR IMAGING GUIDED PORT INSERTION  12/23/2022    LEFT HEART CATH AND CORONARY ANGIOGRAPHY N/A 01/31/2018   Procedure: LEFT HEART CATH AND CORONARY ANGIOGRAPHY;  Surgeon: Runell Gess, MD;  Location: [facility] INVASIVE CV LAB;  Service: Cardiovascular;  Laterality: N/A;   NECK SURGERY  2005   VIDEO BRONCHOSCOPY N/A 12/01/2022   Procedure: VIDEO BRONCHOSCOPY WITH FLUORO;  Surgeon: Martina Sinner, MD;  Location: [facility] ENDOSCOPY;  Service: Pulmonary;  Laterality: N/A;    Social History: Social History   Socioeconomic History   Marital status: Married    Spouse name: Not on file   Number of children: Not on file   Years of education: Not on file   Highest education level: Not on file  Occupational History   Not on file  Tobacco Use   Smoking status: Every Day    Current packs/day: 1.00    Average packs/day: 1 pack/day for 42.0 years (42.0 ttl pk-yrs)    Types: Cigarettes   Smokeless tobacco: Never   Tobacco comments:    He used to quit and start back. He has not quit for the past 5 years.   Vaping Use   Vaping status: Never Used  Substance and Sexual Activity   Alcohol use: Yes    Comment: 10 cans of beer   Drug use: Yes    Types: Marijuana    Comment: smokes week here and there   Sexual activity: Yes    Birth control/protection: None  Other Topics Concern   Not on file  Social History Narrative   Works at [employer] and live with wife.    Social Drivers of Corporate investment banker Strain: High Risk (12/31/2022)   Overall Financial Resource Strain (CARDIA)    Difficulty of Paying Living Expenses: Very hard  Food Insecurity: No Food Insecurity (01/20/2023)   Hunger Vital Sign    Worried About Running Out of Food in the Last Year: Never true    Ran Out of Food in the Last Year: Never true  Transportation Needs: No Transportation Needs (01/20/2023)   PRAPARE - Administrator, Civil Service (Medical): No    Lack of Transportation (Non-Medical): No  Physical Activity: Not on file  Stress: Not on file  Social  Connections: Not on file  Intimate Partner Violence: Not At Risk (01/20/2023)   Humiliation, Afraid, Rape, and Kick questionnaire    Fear of Current or Ex-Partner: No    Emotionally Abused: No    Physically Abused: No    Sexually Abused: No    Family History: Family History  Problem Relation Age of Onset  Diabetes Mother    Heart attack Father    Cancer Sister    Hypertension Brother    Colon cancer Neg Hx    Prostate cancer Neg Hx    Lung cancer Neg Hx     Current Medications:  Current Outpatient Medications:    acetaminophen (TYLENOL) 500 MG tablet, Take 500 mg by mouth every 6 (six) hours as needed for mild pain., Disp: , Rfl:    albuterol (VENTOLIN HFA) 108 (90 Base) MCG/ACT inhaler, Inhale 2 puffs into the lungs every 6 (six) hours as needed for wheezing or shortness of breath., Disp: 18 g, Rfl: 2   aluminum-magnesium hydroxide 200-200 MG/5ML suspension, Take 10 mLs by mouth every 6 (six) hours as needed for indigestion., Disp: 480 mL, Rfl: 2   aspirin EC 81 MG tablet, Take 1 tablet (81 mg total) by mouth daily. Swallow whole., Disp: 90 tablet, Rfl: 3   atorvastatin (LIPITOR) 80 MG tablet, TAKE 1 TABLET BY MOUTH ONCE DAILY AT 6PM, Disp: 90 tablet, Rfl: 0   CARBOPLATIN IV, Inject into the vein once a week., Disp: , Rfl:    empagliflozin (JARDIANCE) 10 MG TABS tablet, Take 1 tablet (10 mg total) by mouth daily before breakfast., Disp: 30 tablet, Rfl: 3   ezetimibe (ZETIA) 10 MG tablet, Take 1 tablet by mouth once daily, Disp: 30 tablet, Rfl: 0   HYDROcodone-acetaminophen (NORCO) 7.5-325 MG tablet, Take 1 tablet by mouth every 6 (six) hours as needed for moderate pain., Disp: 120 tablet, Rfl: 0   lidocaine-prilocaine (EMLA) cream, Apply to affected area once, Disp: 30 g, Rfl: 3   lisinopril (ZESTRIL) 10 MG tablet, Take 1 tablet by mouth once daily, Disp: 30 tablet, Rfl: 0   Menthol, Topical Analgesic, (BIOFREEZE EX), Apply 1 Application topically daily as needed (pain)., Disp: ,  Rfl:    metoprolol succinate (TOPROL-XL) 25 MG 24 hr tablet, Take 1 tablet (25 mg total) by mouth daily., Disp: 90 tablet, Rfl: 3   nitroGLYCERIN (NITROSTAT) 0.4 MG SL tablet, Place 1 tablet (0.4 mg total) under the tongue every 5 (five) minutes as needed., Disp: 25 tablet, Rfl: 0   OLANZapine (ZYPREXA) 5 MG tablet, TAKE 1 TABLET BY MOUTH AT BEDTIME, Disp: 30 tablet, Rfl: 0   PACLITAXEL IV, Inject into the vein once a week., Disp: , Rfl:    prochlorperazine (COMPAZINE) 10 MG tablet, Take 1 tablet (10 mg total) by mouth every 6 (six) hours as needed for nausea or vomiting., Disp: 60 tablet, Rfl: 3   sildenafil (VIAGRA) 50 MG tablet, TAKE 1 TABLET BY MOUTH ONCE DAILY AS NEEDED FOR ERECTILE DYSFUNCTION, Disp: 4 tablet, Rfl: 0 No current facility-administered medications for this visit.  Facility-Administered Medications Ordered in Other Visits:    durvalumab (IMFINZI) 1,500 mg in sodium chloride 0.9 % 100 mL chemo infusion, 1,500 mg, Intravenous, Once, [name], MD   heparin lock flush 100 unit/mL, 500 Units, Intracatheter, Once PRN, [name], MD   sodium chloride flush (NS) 0.9 % injection 10 mL, 10 mL, Intracatheter, PRN, [name], MD   Allergies: No Known Allergies  REVIEW OF SYSTEMS:   Review of Systems  Constitutional:  Positive for fatigue. Negative for chills and fever.  HENT:   Negative for lump/mass, mouth sores, nosebleeds, sore throat and trouble swallowing.   Eyes:  Negative for eye problems.  Respiratory:  Positive for shortness of breath. Negative for cough.   Cardiovascular:  Negative for chest pain, leg swelling and palpitations.  Gastrointestinal:  Negative for abdominal pain, constipation, diarrhea, nausea and vomiting.  Genitourinary:  Negative for bladder incontinence, difficulty urinating, dysuria, frequency, hematuria and nocturia.   Musculoskeletal:  Negative for arthralgias, back pain, flank pain, myalgias and neck pain.  Skin:   Negative for itching and rash.  Neurological:  Positive for numbness. Negative for dizziness and headaches.  Hematological:  Does not bruise/bleed easily.  Psychiatric/Behavioral:  Negative for depression, sleep disturbance and suicidal ideas. The patient is nervous/anxious.   All other systems reviewed and are negative.    VITALS:   There were no vitals taken for this visit.  Wt Readings from Last 3 Encounters:  09/13/23 181 lb (82.1 kg)  08/16/23 180 lb 8.9 oz (81.9 kg)  07/19/23 183 lb 6.8 oz (83.2 kg)    There is no height or weight on file to calculate BMI.  Performance status (ECOG): 1 - Symptomatic but completely ambulatory  PHYSICAL EXAM:   Physical Exam Vitals and nursing note reviewed. Exam conducted with a chaperone present.  Constitutional:      Appearance: Normal appearance.  Cardiovascular:     Rate and Rhythm: Normal rate and regular rhythm.     Pulses: Normal pulses.     Heart sounds: Normal heart sounds.  Pulmonary:     Effort: Pulmonary effort is normal.     Breath sounds: Wheezing (Right lung) present.  Abdominal:     Palpations: Abdomen is soft. There is no hepatomegaly, splenomegaly or mass.     Tenderness: There is no abdominal tenderness.  Musculoskeletal:     Right lower leg: No edema.     Left lower leg: No edema.  Lymphadenopathy:     Cervical: No cervical adenopathy.     Right cervical: No superficial, deep or posterior cervical adenopathy.    Left cervical: No superficial, deep or posterior cervical adenopathy.     Upper Body:     Right upper body: No supraclavicular or axillary adenopathy.     Left upper body: No supraclavicular or axillary adenopathy.  Neurological:     General: No focal deficit present.     Mental Status: He is alert and oriented to person, place, and time.  Psychiatric:        Mood and Affect: Mood normal.        Behavior: Behavior normal.     LABS:   CBC     Component Value Date/Time   WBC 6.9 09/13/2023  1051   RBC 4.48 09/13/2023 1051   HGB 14.2 09/13/2023 1051   HGB 13.1 10/28/2022 1501   HCT 42.8 09/13/2023 1051   HCT 40.1 10/28/2022 1501   PLT 200 09/13/2023 1051   PLT 363 10/28/2022 1501   MCV 95.5 09/13/2023 1051   MCV 90 10/28/2022 1501   MCH 31.7 09/13/2023 1051   MCHC 33.2 09/13/2023 1051   RDW 13.9 09/13/2023 1051   RDW 13.3 10/28/2022 1501   LYMPHSABS 1.9 09/13/2023 1051   LYMPHSABS 3.2 (H) 10/28/2022 1501   MONOABS 0.7 09/13/2023 1051   EOSABS 0.2 09/13/2023 1051   EOSABS 0.2 10/28/2022 1501   BASOSABS 0.0 09/13/2023 1051   BASOSABS 0.1 10/28/2022 1501    CMP      Component Value Date/Time   NA 135 09/13/2023 1051   NA 137 10/28/2022 1501   K 3.7 09/13/2023 1051   CL 99 09/13/2023 1051   CO2 25 09/13/2023 1051   GLUCOSE 94 09/13/2023 1051   BUN 9 09/13/2023 1051   BUN 10 10/28/2022  1501   CREATININE 0.73 09/13/2023 1051   CREATININE 0.78 07/04/2021 1606   CALCIUM 9.2 09/13/2023 1051   PROT 7.5 09/13/2023 1051   PROT 7.2 10/28/2022 1501   ALBUMIN 4.1 09/13/2023 1051   ALBUMIN 4.1 10/28/2022 1501   AST 42 (H) 09/13/2023 1051   ALT 43 09/13/2023 1051   ALKPHOS 79 09/13/2023 1051   BILITOT 0.9 09/13/2023 1051   BILITOT 0.3 10/28/2022 1501   GFRNONAA >60 09/13/2023 1051   GFRAA >60 02/01/2018 0043     No results found for: "CEA1", "CEA" / No results found for: "CEA1", "CEA" Lab Results  Component Value Date   PSA1 0.6 07/31/2021   No results found for: "NFA213" No results found for: "CAN125"  No results found for: "TOTALPROTELP", "ALBUMINELP", "A1GS", "A2GS", "BETS", "BETA2SER", "GAMS", "MSPIKE", "SPEI" No results found for: "TIBC", "FERRITIN", "IRONPCTSAT" No results found for: "LDH"   STUDIES:   No results found.

## 2023-09-13 ENCOUNTER — Inpatient Hospital Stay (HOSPITAL_BASED_OUTPATIENT_CLINIC_OR_DEPARTMENT_OTHER): Payer: 59 | Admitting: Hematology

## 2023-09-13 ENCOUNTER — Other Ambulatory Visit: Payer: Self-pay | Admitting: *Deleted

## 2023-09-13 ENCOUNTER — Inpatient Hospital Stay: Payer: 59

## 2023-09-13 VITALS — BP 151/85 | HR 108 | Temp 97.7°F | Resp 19 | Wt 181.0 lb

## 2023-09-13 VITALS — BP 118/75 | HR 86 | Temp 97.7°F | Resp 18

## 2023-09-13 DIAGNOSIS — C3492 Malignant neoplasm of unspecified part of left bronchus or lung: Secondary | ICD-10-CM

## 2023-09-13 DIAGNOSIS — Z95828 Presence of other vascular implants and grafts: Secondary | ICD-10-CM

## 2023-09-13 DIAGNOSIS — Z5112 Encounter for antineoplastic immunotherapy: Secondary | ICD-10-CM | POA: Diagnosis not present

## 2023-09-13 LAB — COMPREHENSIVE METABOLIC PANEL WITH GFR
ALT: 43 U/L (ref 0–44)
AST: 42 U/L — ABNORMAL HIGH (ref 15–41)
Albumin: 4.1 g/dL (ref 3.5–5.0)
Alkaline Phosphatase: 79 U/L (ref 38–126)
Anion gap: 11 (ref 5–15)
BUN: 9 mg/dL (ref 8–23)
CO2: 25 mmol/L (ref 22–32)
Calcium: 9.2 mg/dL (ref 8.9–10.3)
Chloride: 99 mmol/L (ref 98–111)
Creatinine, Ser: 0.73 mg/dL (ref 0.61–1.24)
GFR, Estimated: >60 mL/min (ref >60)
Glucose, Bld: 94 mg/dL (ref 70–99)
Potassium: 3.7 mmol/L (ref 3.5–5.1)
Sodium: 135 mmol/L (ref 135–145)
Total Bilirubin: 0.9 mg/dL (ref 0.0–1.2)
Total Protein: 7.5 g/dL (ref 6.5–8.1)

## 2023-09-13 LAB — CBC WITH DIFFERENTIAL/PLATELET
Abs Immature Granulocytes: 0.01 10*3/uL (ref 0.00–0.07)
Basophils Absolute: 0 10*3/uL (ref 0.0–0.1)
Basophils Relative: 1 %
Eosinophils Absolute: 0.2 10*3/uL (ref 0.0–0.5)
Eosinophils Relative: 2 %
HCT: 42.8 % (ref 39.0–52.0)
Hemoglobin: 14.2 g/dL (ref 13.0–17.0)
Immature Granulocytes: 0 %
Lymphocytes Relative: 28 %
Lymphs Abs: 1.9 10*3/uL (ref 0.7–4.0)
MCH: 31.7 pg (ref 26.0–34.0)
MCHC: 33.2 g/dL (ref 30.0–36.0)
MCV: 95.5 fL (ref 80.0–100.0)
Monocytes Absolute: 0.7 10*3/uL (ref 0.1–1.0)
Monocytes Relative: 10 %
Neutro Abs: 4.1 10*3/uL (ref 1.7–7.7)
Neutrophils Relative %: 59 %
Platelets: 200 10*3/uL (ref 150–400)
RBC: 4.48 MIL/uL (ref 4.22–5.81)
RDW: 13.9 % (ref 11.5–15.5)
WBC: 6.9 10*3/uL (ref 4.0–10.5)
nRBC: 0 % (ref 0.0–0.2)

## 2023-09-13 LAB — MAGNESIUM: Magnesium: 1.8 mg/dL (ref 1.7–2.4)

## 2023-09-13 LAB — TSH: TSH: 2.269 u[IU]/mL (ref 0.350–4.500)

## 2023-09-13 MED ORDER — SODIUM CHLORIDE 0.9% FLUSH
10.0000 mL | INTRAVENOUS | Status: DC | PRN
Start: 2023-09-13 — End: 2023-09-13
  Administered 2023-09-13: 10 mL via INTRAVENOUS

## 2023-09-13 MED ORDER — SODIUM CHLORIDE 0.9 % IV SOLN
1500.0000 mg | Freq: Once | INTRAVENOUS | Status: AC
  Administered 2023-09-13: 1500 mg via INTRAVENOUS
  Filled 2023-09-13: qty 30

## 2023-09-13 MED ORDER — SODIUM CHLORIDE 0.9 % IV SOLN: Freq: Once | INTRAVENOUS | Status: AC

## 2023-09-13 MED ORDER — HEPARIN SOD (PORK) LOCK FLUSH 100 UNIT/ML IV SOLN
500.0000 [IU] | Freq: Once | INTRAVENOUS | Status: AC | PRN
  Administered 2023-09-13: 500 [IU]

## 2023-09-13 MED ORDER — SODIUM CHLORIDE 0.9% FLUSH
10.0000 mL | INTRAVENOUS | Status: DC | PRN
  Administered 2023-09-13: 10 mL

## 2023-09-13 NOTE — Patient Instructions (Signed)

## 2023-09-13 NOTE — Progress Notes (Signed)
 Patients port flushed without difficulty.  Good blood return noted with no bruising or swelling noted at site.  Patient remains accessed for treatment.

## 2023-09-13 NOTE — Progress Notes (Signed)
 Patient presents today for Imfinzi infusion per providers order.  Vital signs and labs reviewed by MD.  Message received from Chapman Moss RN/Dr. [facility] patient okay for treatment.  Treatment given today per MD orders.  Stable during infusion without adverse affects.  Vital signs stable.  No complaints at this time.  Discharge from clinic ambulatory in stable condition.  Alert and oriented X 3.  Follow up with [facility] as scheduled.

## 2023-09-13 NOTE — Patient Instructions (Signed)
 [facility] - A DEPT OF [facility]  Discharge Instructions: Thank you for choosing [facility] to provide your oncology and hematology care.  If you have a lab appointment with the Cancer Center - please note that after April 8th, 2024, all labs will be drawn in the cancer center.  You do not have to check in or register with the main entrance as you have in the past but will complete your check-in in the cancer center.  Wear comfortable clothing and clothing appropriate for easy access to any Portacath or PICC line.   We strive to give you quality time with your provider. You may need to reschedule your appointment if you arrive late (15 or more minutes).  Arriving late affects you and other patients whose appointments are after yours.  Also, if you miss three or more appointments without notifying the office, you may be dismissed from the clinic at the provider's discretion.      For prescription refill requests, have your pharmacy contact our office and allow 72 hours for refills to be completed.    Today you received the following chemotherapy and/or immunotherapy agents Imfinzi      To help prevent nausea and vomiting after your treatment, we encourage you to take your nausea medication as directed.  BELOW ARE SYMPTOMS THAT SHOULD BE REPORTED IMMEDIATELY: *FEVER GREATER THAN 100.4 F (38 C) OR HIGHER *CHILLS OR SWEATING *NAUSEA AND VOMITING THAT IS NOT CONTROLLED WITH YOUR NAUSEA MEDICATION *UNUSUAL SHORTNESS OF BREATH *UNUSUAL BRUISING OR BLEEDING *URINARY PROBLEMS (pain or burning when urinating, or frequent urination) *BOWEL PROBLEMS (unusual diarrhea, constipation, pain near the anus) TENDERNESS IN MOUTH AND THROAT WITH OR WITHOUT PRESENCE OF ULCERS (sore throat, sores in mouth, or a toothache) UNUSUAL RASH, SWELLING OR PAIN  UNUSUAL VAGINAL DISCHARGE OR ITCHING   Items with * indicate a potential emergency and should be followed up  as soon as possible or go to the Emergency Department if any problems should occur.  Please show the CHEMOTHERAPY ALERT CARD or IMMUNOTHERAPY ALERT CARD at check-in to the Emergency Department and triage nurse.  Should you have questions after your visit or need to cancel or reschedule your appointment, please contact [facility] [place] - A DEPT OF [facility] [phone]  and follow the prompts.  Office hours are 8:00 a.m. to 4:30 p.m. Monday - Friday. Please note that voicemails left after 4:00 p.m. may not be returned until the following business day.  We are closed weekends and major holidays. You have access to a nurse at all times for urgent questions. Please call the main number to the clinic [phone] and follow the prompts.  For any non-urgent questions, you may also contact your provider using MyChart. We now offer e-Visits for anyone 42 and older to request care online for non-urgent symptoms. For details visit [email or web address].   Also download the MyChart app! Go to the app store, search "MyChart", open the app, select [place], and log in with your MyChart username and password.

## 2023-09-15 ENCOUNTER — Encounter (HOSPITAL_COMMUNITY): Payer: Self-pay | Admitting: Hematology

## 2023-09-21 ENCOUNTER — Encounter (HOSPITAL_COMMUNITY): Payer: Self-pay | Admitting: Hematology

## 2023-09-29 ENCOUNTER — Encounter (HOSPITAL_COMMUNITY): Payer: Self-pay | Admitting: Hematology

## 2023-09-30 ENCOUNTER — Encounter (HOSPITAL_COMMUNITY): Payer: Self-pay | Admitting: Hematology

## 2023-10-04 ENCOUNTER — Ambulatory Visit (HOSPITAL_COMMUNITY)
Admission: RE | Admit: 2023-10-04 | Discharge: 2023-10-04 | Disposition: A | Discharge status: Home / Self Care | Source: Ambulatory Visit | Attending: Hematology | Admitting: Hematology

## 2023-10-04 DIAGNOSIS — C3492 Malignant neoplasm of unspecified part of left bronchus or lung: Secondary | ICD-10-CM | POA: Insufficient documentation

## 2023-10-04 MED ORDER — IOHEXOL 300 MG/ML  SOLN
75.0000 mL | Freq: Once | INTRAMUSCULAR | Status: AC | PRN
  Administered 2023-10-04: 75 mL via INTRAVENOUS

## 2023-10-07 ENCOUNTER — Other Ambulatory Visit: Payer: Self-pay | Admitting: Internal Medicine

## 2023-10-07 DIAGNOSIS — I2510 Atherosclerotic heart disease of native coronary artery without angina pectoris: Secondary | ICD-10-CM

## 2023-10-07 DIAGNOSIS — N529 Male erectile dysfunction, unspecified: Secondary | ICD-10-CM

## 2023-10-07 DIAGNOSIS — E782 Mixed hyperlipidemia: Secondary | ICD-10-CM

## 2023-10-11 ENCOUNTER — Inpatient Hospital Stay

## 2023-10-11 ENCOUNTER — Encounter (HOSPITAL_COMMUNITY): Payer: Self-pay | Admitting: Hematology

## 2023-10-11 ENCOUNTER — Other Ambulatory Visit (HOSPITAL_COMMUNITY): Payer: Self-pay

## 2023-10-11 ENCOUNTER — Telehealth: Payer: Self-pay | Admitting: Pharmacy Technician

## 2023-10-11 ENCOUNTER — Inpatient Hospital Stay: Attending: Hematology | Admitting: Hematology

## 2023-10-11 VITALS — BP 143/90

## 2023-10-11 VITALS — BP 147/86 | HR 113 | Temp 97.4°F | Resp 19

## 2023-10-11 DIAGNOSIS — R0602 Shortness of breath: Secondary | ICD-10-CM | POA: Diagnosis not present

## 2023-10-11 DIAGNOSIS — C3412 Malignant neoplasm of upper lobe, left bronchus or lung: Secondary | ICD-10-CM | POA: Diagnosis not present

## 2023-10-11 DIAGNOSIS — C3492 Malignant neoplasm of unspecified part of left bronchus or lung: Secondary | ICD-10-CM | POA: Diagnosis not present

## 2023-10-11 DIAGNOSIS — R059 Cough, unspecified: Secondary | ICD-10-CM | POA: Diagnosis not present

## 2023-10-11 DIAGNOSIS — I10 Essential (primary) hypertension: Secondary | ICD-10-CM | POA: Diagnosis not present

## 2023-10-11 DIAGNOSIS — F17210 Nicotine dependence, cigarettes, uncomplicated: Secondary | ICD-10-CM | POA: Insufficient documentation

## 2023-10-11 DIAGNOSIS — Z5112 Encounter for antineoplastic immunotherapy: Secondary | ICD-10-CM | POA: Diagnosis present

## 2023-10-11 DIAGNOSIS — Z79620 Long term (current) use of immunosuppressive biologic: Secondary | ICD-10-CM | POA: Diagnosis not present

## 2023-10-11 DIAGNOSIS — Z923 Personal history of irradiation: Secondary | ICD-10-CM | POA: Insufficient documentation

## 2023-10-11 DIAGNOSIS — G629 Polyneuropathy, unspecified: Secondary | ICD-10-CM | POA: Insufficient documentation

## 2023-10-11 LAB — COMPREHENSIVE METABOLIC PANEL WITH GFR
ALT: 34 U/L (ref 0–44)
AST: 36 U/L (ref 15–41)
Albumin: 4.2 g/dL (ref 3.5–5.0)
Alkaline Phosphatase: 79 U/L (ref 38–126)
Anion gap: 12 (ref 5–15)
BUN: 7 mg/dL — ABNORMAL LOW (ref 8–23)
CO2: 25 mmol/L (ref 22–32)
Calcium: 9.3 mg/dL (ref 8.9–10.3)
Chloride: 98 mmol/L (ref 98–111)
Creatinine, Ser: 0.67 mg/dL (ref 0.61–1.24)
GFR, Estimated: >60 mL/min (ref >60)
Glucose, Bld: 100 mg/dL — ABNORMAL HIGH (ref 70–99)
Potassium: 3.6 mmol/L (ref 3.5–5.1)
Sodium: 135 mmol/L (ref 135–145)
Total Bilirubin: 0.7 mg/dL (ref 0.0–1.2)
Total Protein: 7.5 g/dL (ref 6.5–8.1)

## 2023-10-11 LAB — CBC WITH DIFFERENTIAL/PLATELET
Abs Immature Granulocytes: 0.02 10*3/uL (ref 0.00–0.07)
Basophils Absolute: 0 10*3/uL (ref 0.0–0.1)
Basophils Relative: 0 %
Eosinophils Absolute: 0.1 10*3/uL (ref 0.0–0.5)
Eosinophils Relative: 1 %
HCT: 43 % (ref 39.0–52.0)
Hemoglobin: 14.3 g/dL (ref 13.0–17.0)
Immature Granulocytes: 0 %
Lymphocytes Relative: 25 %
Lymphs Abs: 1.6 10*3/uL (ref 0.7–4.0)
MCH: 31 pg (ref 26.0–34.0)
MCHC: 33.3 g/dL (ref 30.0–36.0)
MCV: 93.3 fL (ref 80.0–100.0)
Monocytes Absolute: 0.6 10*3/uL (ref 0.1–1.0)
Monocytes Relative: 10 %
Neutro Abs: 4.1 10*3/uL (ref 1.7–7.7)
Neutrophils Relative %: 64 %
Platelets: 194 10*3/uL (ref 150–400)
RBC: 4.61 MIL/uL (ref 4.22–5.81)
RDW: 14.5 % (ref 11.5–15.5)
WBC: 6.4 10*3/uL (ref 4.0–10.5)
nRBC: 0 % (ref 0.0–0.2)

## 2023-10-11 LAB — TSH: TSH: 2.613 u[IU]/mL (ref 0.350–4.500)

## 2023-10-11 LAB — MAGNESIUM: Magnesium: 1.8 mg/dL (ref 1.7–2.4)

## 2023-10-11 MED ORDER — HEPARIN SOD (PORK) LOCK FLUSH 100 UNIT/ML IV SOLN
500.0000 [IU] | Freq: Once | INTRAVENOUS | Status: AC | PRN
  Administered 2023-10-11: 500 [IU]

## 2023-10-11 MED ORDER — SODIUM CHLORIDE 0.9% FLUSH
10.0000 mL | INTRAVENOUS | Status: DC | PRN
  Administered 2023-10-11: 10 mL

## 2023-10-11 MED ORDER — SODIUM CHLORIDE 0.9 % IV SOLN
1500.0000 mg | Freq: Once | INTRAVENOUS | Status: AC
  Administered 2023-10-11: 1500 mg via INTRAVENOUS
  Filled 2023-10-11: qty 30

## 2023-10-11 MED ORDER — SODIUM CHLORIDE 0.9 % IV SOLN: Freq: Once | INTRAVENOUS | Status: AC

## 2023-10-11 MED ORDER — SODIUM CHLORIDE 0.9% FLUSH
10.0000 mL | Freq: Once | INTRAVENOUS | Status: AC
  Administered 2023-10-11: 10 mL

## 2023-10-11 NOTE — Progress Notes (Signed)
 Ok to proceed with HR 115  V.O. Dr Davina Ester, PharmD

## 2023-10-11 NOTE — Telephone Encounter (Signed)
 Pharmacy Patient Advocate Encounter   Received notification from CoverMyMeds that prior authorization for OLANZapine  5MG  tablets is required/requested.   Insurance verification completed.   The patient is insured through [facility] .   Per test claim: PA required; PA started via CoverMyMeds. KEY [id number] . Please see clinical question(s) below that I am not finding the answer to in her chart and advise.  Please provide diagnosis for the requested medication.

## 2023-10-11 NOTE — Progress Notes (Signed)
 [facility] [address], [place] [address]    Clinic Day:  10/11/2023  Referring physician: Tobi Fortes, MD  Patient Care Team: Patient, No Pcp Per as PCP - General (General Practice) Gerard Knight, MD as PCP - Cardiology (Cardiology) Denzil Flatten, MD as Consulting Physician (Internal Medicine) Gerhard Knuckles, RN as Oncology Nurse Navigator (Medical Oncology) Paulett Boros, MD as Medical Oncologist (Medical Oncology)   ASSESSMENT & PLAN:   Assessment: 1.  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  No weight loss or hemoptysis. - CT angio chest (11/30/2022): Left upper lobe mass with central necrosis and bulky mediastinal, left hilar and left lower neck adenopathy and lytic lesion in the right superior sternum. - Bronchoscopy (12/01/2022): Narrowing of LUL apical and posterior segment.  Otherwise normal-appearing bronchial trees. - LUL upper lobe biopsy (12/01/2022): Invasive moderately differentiated squamous cell carcinoma. - PET scan (12/16/2022): Left supraclavicular 3.2 cm mass, direct extension from mediastinal adenopathy.  Small right supraclavicular lymph node.  Large left lung mass 7.4 x 6.1 cm extending into the left hilum and AP window.  Prevascular and paratracheal lymph nodes and right paratracheal node.  Extensive involvement of sternum manubrium and first rib close to manubrium.  Soft tissue extension into the medial border of the overlying pectoralis muscle.  Discrete hypermetabolic right upper lobe nodule 10 mm.  No abdominal or pelvic metastatic disease. - He has received 1 dose of concurrent chemo XRT on 12/23/2022. - Left lung XRT 12 Gray in 4 fractions and left bronchus radiation 18 Gray in 6 fractions completed - [employer] (01/07/2023): PIK3CA, T p53, MSI high not detected. - NGS (01/03/2023): TMB-high.  MSI-stable.  PIK3CA and T p53  mutations present.  No other targetable mutations.  PD-L1 TPS 0%.  HER2 IHC 0. - Carboplatin , paclitaxel , durvalumab  and tremelimumab  cycle 1 started on 01/13/2023, cycle 6 completed on 04/28/2023, maintenance Imfinzi  started on 05/20/2023   2.  Social/family history: - Lives at home by himself.  His wife helps him but they are separated.  He is independent of ADLs and IADLs.  Retired from work in April 2024.  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  Current active smoker, 1 pack/day started at age 22 and quit at the time of diagnosis. - Sister had cancer.  2 paternal cousins had cancer.    Plan: 1.  Stage IV (T4 N2 M1) squamous cell lung cancer: - He does not report any immunotherapy related side effects. - He does have some stiffness of the right shoulder joint and occasional soreness on lifting above his head. - Labs today: Normal LFTs and creatinine.  CBC grossly normal.  TSH is 2.26. - CT chest (10/04/2023): Left upper lobe lung mass is stable.  No evidence of progression or recurrence.  Mild prevascular soft tissue measuring up to 11 mm stable. - Continue Durvalumab  every 4 weeks.  RTC 8 weeks for follow-up.    2.  Peripheral neuropathy: - Fingertip numbness and toe numbness is constant and stable.   3.  Hypertension: - Continue Toprol -XL daily.  Blood pressure is 140/90.   4.  Wheezing/shortness of breath: - He has occasional cough with clear expectoration. - Today he is wheezing on examination.  He was recommended to use albuterol  inhaler regularly which he often forgets.    Orders Placed This Encounter  Procedures   TSH  Standing Status:   Future    Expected Date:   01/03/2024    Expiration Date:   01/02/2025   CBC with Differential (Cancer Center Only)    Standing Status:   Future    Expected Date:   01/03/2024    Expiration Date:   01/02/2025   CMP (Cancer Center only)    Standing Status:   Future    Expected Date:   01/03/2024    Expiration Date:    01/02/2025   Magnesium     Standing Status:   Future    Expected Date:   01/03/2024    Expiration Date:   01/02/2025   TSH    Standing Status:   Future    Expected Date:   01/31/2024    Expiration Date:   01/30/2025   CBC with Differential (Cancer Center Only)    Standing Status:   Future    Expected Date:   01/31/2024    Expiration Date:   01/30/2025   CMP (Cancer Center only)    Standing Status:   Future    Expected Date:   01/31/2024    Expiration Date:   01/30/2025   Magnesium     Standing Status:   Future    Expected Date:   01/31/2024    Expiration Date:   01/30/2025   TSH    Standing Status:   Future    Expected Date:   02/28/2024    Expiration Date:   02/27/2025   CBC with Differential (Cancer Center Only)    Standing Status:   Future    Expected Date:   02/28/2024    Expiration Date:   02/27/2025   CMP (Cancer Center only)    Standing Status:   Future    Expected Date:   02/28/2024    Expiration Date:   02/27/2025   Magnesium     Standing Status:   Future    Expected Date:   02/28/2024    Expiration Date:   02/27/2025   TSH    Standing Status:   Future    Expected Date:   03/27/2024    Expiration Date:   03/27/2025   CBC with Differential (Cancer Center Only)    Standing Status:   Future    Expected Date:   03/27/2024    Expiration Date:   03/27/2025   CMP (Cancer Center only)    Standing Status:   Future    Expected Date:   03/27/2024    Expiration Date:   03/27/2025   Magnesium     Standing Status:   Future    Expected Date:   03/27/2024    Expiration Date:   03/27/2025   TSH    Standing Status:   Future    Expected Date:   04/24/2024    Expiration Date:   04/24/2025   CBC with Differential (Cancer Center Only)    Standing Status:   Future    Expected Date:   04/24/2024    Expiration Date:   04/24/2025   CMP (Cancer Center only)    Standing Status:   Future    Expected Date:   04/24/2024    Expiration Date:   04/24/2025   Magnesium     Standing Status:   Future     Expected Date:   04/24/2024    Expiration Date:   04/24/2025   TSH    Standing Status:   Future    Expected Date:   05/22/2024    Expiration Date:  05/22/2025   CBC with Differential (Cancer Center Only)    Standing Status:   Future    Expected Date:   05/22/2024    Expiration Date:   05/22/2025   CMP (Cancer Center only)    Standing Status:   Future    Expected Date:   05/22/2024    Expiration Date:   05/22/2025   Magnesium     Standing Status:   Future    Expected Date:   05/22/2024    Expiration Date:   05/22/2025      I,Katie Daubenspeck,acting as a scribe for Paulett Boros, MD.,have documented all relevant documentation on the behalf of Paulett Boros, MD,as directed by  Paulett Boros, MD while in the presence of Paulett Boros, MD.   I, Paulett Boros MD, have reviewed the above documentation for accuracy and completeness, and I agree with the above.   Paulett Boros, MD   4/28/202512:10 PM  CHIEF COMPLAINT:   Diagnosis: metastatic squamous cell lung cancer    Cancer Staging  Squamous cell lung cancer, left (HCC) Staging form: Lung, AJCC 8th Edition - Clinical stage from 12/22/2022: Stage IVA (cT4, cN3, cM1a) - Unsigned    Prior Therapy: 1. radiation therapy 12/15/22 - 12/29/22, with one dose carboplatin /taxol  on 12/25/22  2. carboplatin /paclitaxel , durvalumab , tremelimumab , 6 cycles, 01/13/23 - 04/29/23  Current Therapy:  maintenance durvalumab     HISTORY OF PRESENT ILLNESS:   Oncology History  Squamous cell lung cancer, left (HCC)  12/22/2022 Initial Diagnosis   Squamous cell lung cancer, left (HCC)   12/25/2022 - 12/25/2022 Chemotherapy   Patient is on Treatment Plan : ESOPHAGUS Carboplatin  + Paclitaxel  Weekly X 6 Weeks with XRT     01/13/2023 -  Chemotherapy   Patient is on Treatment Plan : LUNG NSCLC Squamous Tremelimumab -actl (cycles 1,2,3,4,6) + Durvalumab  + PAClitaxel  + Carboplatin  q21 days x 4 cycles / Durvalumab  q28 days         INTERVAL HISTORY:   Kalo is a 64 y.o. male seen for follow-up of metastatic squamous cell lung cancer.  He is receiving immunotherapy and denies any skin rashes.  Reports appetite 100%.  Energy levels are 80%.  He is accompanied by his ex-wife today.  PAST MEDICAL HISTORY:   Past Medical History: Past Medical History:  Diagnosis Date   Bronchitis    CAD (coronary artery disease)    a. cath 11/26/2014 EF 45%, moderate to severe inferior hypokinesis, 90% small D1, 100% mid to distal RCA with minimal collateral, 40% mid to distal LCx. Medical therapy as RCA occlusion appears to be completed event; DES to mid circumflex October 2019   Essential hypertension    Hyperlipidemia    Ischemic cardiomyopathy    NSTEMI (non-ST elevated myocardial infarction) (HCC)    11/26/14, 01/31/18   Squamous cell lung cancer, left [facility])     Surgical History: Past Surgical History:  Procedure Laterality Date   BRONCHIAL BIOPSY  12/01/2022   Procedure: BRONCHIAL BIOPSIES;  Surgeon: Wilfredo Hanly, MD;  Location: [facility] ENDOSCOPY;  Service: Pulmonary;;   BRONCHIAL BRUSHINGS  12/01/2022   Procedure: BRONCHIAL BRUSHINGS;  Surgeon: Wilfredo Hanly, MD;  Location: [facility] ENDOSCOPY;  Service: Pulmonary;;   BRONCHIAL WASHINGS  12/01/2022   Procedure: BRONCHIAL WASHINGS;  Surgeon: Wilfredo Hanly, MD;  Location: [facility] ENDOSCOPY;  Service: Pulmonary;;   CARDIAC CATHETERIZATION N/A 11/26/2014   Procedure: Left Heart Cath and Coronary Angiography;  Surgeon: Peter M Swaziland, MD;  Location: [facility] INVASIVE CV LAB;  Service: Cardiovascular;  Laterality:  N/A;   COLONOSCOPY     per patient: done in [place], age 56, couple of polyps.   COLONOSCOPY WITH PROPOFOL  N/A 10/08/2022   Procedure: COLONOSCOPY WITH PROPOFOL ;  Surgeon: Vinetta Greening, DO;  Location: [facility] ENDO SUITE;  Service: Endoscopy;  Laterality: N/A;  11:00 am   CORONARY STENT INTERVENTION N/A 01/31/2018   Procedure: CORONARY STENT INTERVENTION;  Surgeon: Avanell Leigh,  MD;  Location: [facility] INVASIVE CV LAB;  Service: Cardiovascular;  Laterality: N/A;   HEMOSTASIS CONTROL  12/01/2022   Procedure: HEMOSTASIS CONTROL;  Surgeon: Wilfredo Hanly, MD;  Location: [facility] ENDOSCOPY;  Service: Pulmonary;;  cold saline   IR IMAGING GUIDED PORT INSERTION  12/23/2022   LEFT HEART CATH AND CORONARY ANGIOGRAPHY N/A 01/31/2018   Procedure: LEFT HEART CATH AND CORONARY ANGIOGRAPHY;  Surgeon: Avanell Leigh, MD;  Location: [facility] INVASIVE CV LAB;  Service: Cardiovascular;  Laterality: N/A;   NECK SURGERY  2005   VIDEO BRONCHOSCOPY N/A 12/01/2022   Procedure: VIDEO BRONCHOSCOPY WITH FLUORO;  Surgeon: Wilfredo Hanly, MD;  Location: [facility] ENDOSCOPY;  Service: Pulmonary;  Laterality: N/A;    Social History: Social History   Socioeconomic History   Marital status: Married    Spouse name: Not on file   Number of children: Not on file   Years of education: Not on file   Highest education level: Not on file  Occupational History   Not on file  Tobacco Use   Smoking status: Every Day    Current packs/day: 1.00    Average packs/day: 1 pack/day for 42.0 years (42.0 ttl pk-yrs)    Types: Cigarettes   Smokeless tobacco: Never   Tobacco comments:    He used to quit and start back. He has not quit for the past 5 years.   Vaping Use   Vaping status: Never Used  Substance and Sexual Activity   Alcohol use: Yes    Comment: 10 cans of beer   Drug use: Yes    Types: Marijuana    Comment: smokes week here and there   Sexual activity: Yes    Birth control/protection: None  Other Topics Concern   Not on file  Social History Narrative   Works at [employer] and live with wife.    Social Drivers of Corporate investment banker Strain: High Risk (12/31/2022)   Overall Financial Resource Strain (CARDIA)    Difficulty of Paying Living Expenses: Very hard  Food Insecurity: No Food Insecurity (01/20/2023)   Hunger Vital Sign    Worried About Running Out of Food in the Last Year: Never  true    Ran Out of Food in the Last Year: Never true  Transportation Needs: No Transportation Needs (01/20/2023)   PRAPARE - Administrator, Civil Service (Medical): No    Lack of Transportation (Non-Medical): No  Physical Activity: Not on file  Stress: Not on file  Social Connections: Not on file  Intimate Partner Violence: Not At Risk (01/20/2023)   Humiliation, Afraid, Rape, and Kick questionnaire    Fear of Current or Ex-Partner: No    Emotionally Abused: No    Physically Abused: No    Sexually Abused: No    Family History: Family History  Problem Relation Age of Onset   Diabetes Mother    Heart attack Father    Cancer Sister    Hypertension Brother    Colon cancer Neg Hx    Prostate cancer Neg Hx  Lung cancer Neg Hx     Current Medications:  Current Outpatient Medications:    acetaminophen  (TYLENOL ) 500 MG tablet, Take 500 mg by mouth every 6 (six) hours as needed for mild pain., Disp: , Rfl:    albuterol  (VENTOLIN  HFA) 108 (90 Base) MCG/ACT inhaler, Inhale 2 puffs into the lungs every 6 (six) hours as needed for wheezing or shortness of breath., Disp: 18 g, Rfl: 2   aluminum -magnesium  hydroxide 200-200 MG/5ML suspension, Take 10 mLs by mouth every 6 (six) hours as needed for indigestion., Disp: 480 mL, Rfl: 2   aspirin  EC 81 MG tablet, Take 1 tablet (81 mg total) by mouth daily. Swallow whole., Disp: 90 tablet, Rfl: 3   atorvastatin  (LIPITOR ) 80 MG tablet, TAKE 1 TABLET BY MOUTH ONCE DAILY AT  6PM, Disp: 90 tablet, Rfl: 0   CARBOPLATIN  IV, Inject into the vein once a week., Disp: , Rfl:    empagliflozin  (JARDIANCE ) 10 MG TABS tablet, Take 1 tablet (10 mg total) by mouth daily before breakfast., Disp: 30 tablet, Rfl: 3   ezetimibe  (ZETIA ) 10 MG tablet, Take 1 tablet by mouth once daily, Disp: 30 tablet, Rfl: 0   HYDROcodone -acetaminophen  (NORCO) 7.5-325 MG tablet, Take 1 tablet by mouth every 6 (six) hours as needed for moderate pain., Disp: 120 tablet, Rfl: 0    lidocaine -prilocaine  (EMLA ) cream, Apply to affected area once, Disp: 30 g, Rfl: 3   lisinopril  (ZESTRIL ) 10 MG tablet, Take 1 tablet by mouth once daily, Disp: 30 tablet, Rfl: 0   Menthol, Topical Analgesic, (BIOFREEZE EX), Apply 1 Application topically daily as needed (pain)., Disp: , Rfl:    metoprolol  succinate (TOPROL -XL) 25 MG 24 hr tablet, Take 1 tablet (25 mg total) by mouth daily., Disp: 90 tablet, Rfl: 3   nitroGLYCERIN  (NITROSTAT ) 0.4 MG SL tablet, Place 1 tablet (0.4 mg total) under the tongue every 5 (five) minutes as needed., Disp: 25 tablet, Rfl: 0   OLANZapine  (ZYPREXA ) 5 MG tablet, TAKE 1 TABLET BY MOUTH AT BEDTIME, Disp: 30 tablet, Rfl: 0   PACLITAXEL  IV, Inject into the vein once a week., Disp: , Rfl:    prochlorperazine  (COMPAZINE ) 10 MG tablet, Take 1 tablet (10 mg total) by mouth every 6 (six) hours as needed for nausea or vomiting., Disp: 60 tablet, Rfl: 3   sildenafil  (VIAGRA ) 50 MG tablet, TAKE 1 TABLET BY MOUTH ONCE DAILY AS NEEDED FOR ERECTILE DYSFUNCTION, Disp: 4 tablet, Rfl: 0 No current facility-administered medications for this visit.  Facility-Administered Medications Ordered in Other Visits:    durvalumab  (IMFINZI ) 1,500 mg in sodium chloride  0.9 % 100 mL chemo infusion, 1,500 mg, Intravenous, Once, Paulett Boros, MD   heparin  lock flush 100 unit/mL, 500 Units, Intracatheter, Once PRN, Cortlin Marano, MD   sodium chloride  flush (NS) 0.9 % injection 10 mL, 10 mL, Intracatheter, PRN, Tyler Cubit, MD   Allergies: No Known Allergies  REVIEW OF SYSTEMS:   Review of Systems  Constitutional:  Negative for chills and fever.  HENT:   Negative for lump/mass, mouth sores, nosebleeds, sore throat and trouble swallowing.   Eyes:  Negative for eye problems.  Respiratory:  Positive for shortness of breath. Negative for cough.   Cardiovascular:  Negative for chest pain, leg swelling and palpitations.  Gastrointestinal:  Negative for abdominal pain,  constipation, diarrhea, nausea and vomiting.  Genitourinary:  Negative for bladder incontinence, difficulty urinating, dysuria, frequency, hematuria and nocturia.   Musculoskeletal:  Positive for arthralgias (Right shoulder). Negative for back pain,  flank pain, myalgias and neck pain.  Skin:  Negative for itching and rash.  Neurological:  Negative for dizziness and headaches.  Hematological:  Does not bruise/bleed easily.  Psychiatric/Behavioral:  Negative for depression, sleep disturbance and suicidal ideas.   All other systems reviewed and are negative.    VITALS:   Blood pressure (!) 143/90.  Wt Readings from Last 3 Encounters:  09/13/23 181 lb (82.1 kg)  08/16/23 180 lb 8.9 oz (81.9 kg)  07/19/23 183 lb 6.8 oz (83.2 kg)    There is no height or weight on file to calculate BMI.  Performance status (ECOG): 1 - Symptomatic but completely ambulatory  PHYSICAL EXAM:   Physical Exam Vitals and nursing note reviewed. Exam conducted with a chaperone present.  Constitutional:      Appearance: Normal appearance.  Cardiovascular:     Rate and Rhythm: Normal rate and regular rhythm.     Pulses: Normal pulses.     Heart sounds: Normal heart sounds.  Pulmonary:     Effort: Pulmonary effort is normal.     Breath sounds: Wheezing (Right lung) present.  Abdominal:     Palpations: Abdomen is soft. There is no hepatomegaly, splenomegaly or mass.     Tenderness: There is no abdominal tenderness.  Musculoskeletal:     Right lower leg: No edema.     Left lower leg: No edema.  Lymphadenopathy:     Cervical: No cervical adenopathy.     Right cervical: No superficial, deep or posterior cervical adenopathy.    Left cervical: No superficial, deep or posterior cervical adenopathy.     Upper Body:     Right upper body: No supraclavicular or axillary adenopathy.     Left upper body: No supraclavicular or axillary adenopathy.  Neurological:     General: No focal deficit present.     Mental  Status: He is alert and oriented to person, place, and time.  Psychiatric:        Mood and Affect: Mood normal.        Behavior: Behavior normal.     LABS:   CBC     Component Value Date/Time   WBC 6.4 10/11/2023 1117   RBC 4.61 10/11/2023 1117   HGB 14.3 10/11/2023 1117   HGB 13.1 10/28/2022 1501   HCT 43.0 10/11/2023 1117   HCT 40.1 10/28/2022 1501   PLT 194 10/11/2023 1117   PLT 363 10/28/2022 1501   MCV 93.3 10/11/2023 1117   MCV 90 10/28/2022 1501   MCH 31.0 10/11/2023 1117   MCHC 33.3 10/11/2023 1117   RDW 14.5 10/11/2023 1117   RDW 13.3 10/28/2022 1501   LYMPHSABS 1.6 10/11/2023 1117   LYMPHSABS 3.2 (H) 10/28/2022 1501   MONOABS 0.6 10/11/2023 1117   EOSABS 0.1 10/11/2023 1117   EOSABS 0.2 10/28/2022 1501   BASOSABS 0.0 10/11/2023 1117   BASOSABS 0.1 10/28/2022 1501    CMP      Component Value Date/Time   NA 135 10/11/2023 1117   NA 137 10/28/2022 1501   K 3.6 10/11/2023 1117   CL 98 10/11/2023 1117   CO2 25 10/11/2023 1117   GLUCOSE 100 (H) 10/11/2023 1117   BUN 7 (L) 10/11/2023 1117   BUN 10 10/28/2022 1501   CREATININE 0.67 10/11/2023 1117   CREATININE 0.78 07/04/2021 1606   CALCIUM  9.3 10/11/2023 1117   PROT 7.5 10/11/2023 1117   PROT 7.2 10/28/2022 1501   ALBUMIN 4.2 10/11/2023 1117   ALBUMIN 4.1 10/28/2022  1501   AST 36 10/11/2023 1117   ALT 34 10/11/2023 1117   ALKPHOS 79 10/11/2023 1117   BILITOT 0.7 10/11/2023 1117   BILITOT 0.3 10/28/2022 1501   GFRNONAA >60 10/11/2023 1117   GFRAA >60 02/01/2018 0043     No results found for: "CEA1", "CEA" / No results found for: "CEA1", "CEA" Lab Results  Component Value Date   PSA1 0.6 07/31/2021   No results found for: "ZOX096" No results found for: "CAN125"  No results found for: "TOTALPROTELP", "ALBUMINELP", "A1GS", "A2GS", "BETS", "BETA2SER", "GAMS", "MSPIKE", "SPEI" No results found for: "TIBC", "FERRITIN", "IRONPCTSAT" No results found for: "LDH"   STUDIES:   CT CHEST W  CONTRAST Result Date: 10/08/2023 CLINICAL DATA:  Metastatic left lung cancer EXAM: CT CHEST WITH CONTRAST TECHNIQUE: Multidetector CT imaging of the chest was performed during intravenous contrast administration. RADIATION DOSE REDUCTION: This exam was performed according to the departmental dose-optimization program which includes automated exposure control, adjustment of the mA and/or kV according to patient size and/or use of iterative reconstruction technique. CONTRAST:  75mL OMNIPAQUE  IOHEXOL  300 MG/ML  SOLN COMPARISON:  07/09/2023 FINDINGS: Cardiovascular: The heart is normal in size. No pericardial effusion. No evidence of thoracic aortic aneurysm. Atherosclerotic calcifications of the aortic arch. Moderate three-vessel coronary atherosclerosis. Right chest port terminates at the cavoatrial junction. Mediastinum/Nodes: Mild prevascular soft tissue measuring up to 11 mm short axis (image 53), favoring a treated nodal metastasis. Visualized thyroid  is unremarkable. Lungs/Pleura: Radiation changes in the left upper lobe, anterior left lower lobe, and right paramediastinal region. No new/suspicious pulmonary nodules. No focal consolidation. No pleural effusion or pneumothorax. Upper Abdomen: Visualized upper abdomen is grossly unremarkable. Musculoskeletal: Degenerative changes of the visualized thoracolumbar spine. Cervical spine fixation hardware, incompletely visualized. IMPRESSION: Radiation changes in the left lung. No findings suspicious for recurrent or metastatic disease. Aortic Atherosclerosis (ICD10-I70.0). Electronically Signed   By: Zadie Herter M.D.   On: 10/08/2023 23:24

## 2023-10-11 NOTE — Patient Instructions (Signed)
 [facility] [place] - A DEPT OF [facility]  Discharge Instructions: Thank you for choosing [facility] to provide your oncology and hematology care.  If you have a lab appointment with the Cancer Center - please note that after April 8th, 2024, all labs will be drawn in the cancer center.  You do not have to check in or register with the main entrance as you have in the past but will complete your check-in in the cancer center.  Wear comfortable clothing and clothing appropriate for easy access to any Portacath or PICC line.   We strive to give you quality time with your provider. You may need to reschedule your appointment if you arrive late (15 or more minutes).  Arriving late affects you and other patients whose appointments are after yours.  Also, if you miss three or more appointments without notifying the office, you may be dismissed from the clinic at the provider's discretion.      For prescription refill requests, have your pharmacy contact our office and allow 72 hours for refills to be completed.    Today you received the following chemotherapy and/or immunotherapy agents Imfinzi   To help prevent nausea and vomiting after your treatment, we encourage you to take your nausea medication as directed.  Durvalumab Injection What is this medication? DURVALUMAB (dur VAL ue mab) treats some types of cancer. It works by helping your immune system slow or stop the spread of cancer cells. It is a monoclonal antibody. This medicine may be used for other purposes; ask your health care provider or pharmacist if you have questions. COMMON BRAND NAME(S): IMFINZI What should I tell my care team before I take this medication? They need to know if you have any of these conditions: Allogeneic stem cell transplant (uses someone else's stem cells) Autoimmune diseases, such as Crohn disease, ulcerative colitis, lupus History of chest radiation Nervous system  problems, such as Guillain-Barre syndrome, myasthenia gravis Organ transplant An unusual or allergic reaction to durvalumab, other medications, foods, dyes, or preservatives Pregnant or trying to get pregnant Breast-feeding How should I use this medication? This medication is infused into a vein. It is given by your care team in a hospital or clinic setting. A special MedGuide will be given to you before each treatment. Be sure to read this information carefully each time. Talk to your care team about the use of this medication in children. Special care may be needed. Overdosage: If you think you have taken too much of this medicine contact a poison control center or emergency room at once. NOTE: This medicine is only for you. Do not share this medicine with others. What if I miss a dose? Keep appointments for follow-up doses. It is important not to miss your dose. Call your care team if you are unable to keep an appointment. What may interact with this medication? Interactions have not been studied. This list may not describe all possible interactions. Give your health care provider a list of all the medicines, herbs, non-prescription drugs, or dietary supplements you use. Also tell them if you smoke, drink alcohol, or use illegal drugs. Some items may interact with your medicine. What should I watch for while using this medication? Your condition will be monitored carefully while you are receiving this medication. You may need blood work while taking this medication. This medication may cause serious skin reactions. They can happen weeks to months after starting the medication. Contact  your care team right away if you notice fevers or flu-like symptoms with a rash. The rash may be red or purple and then turn into blisters or peeling of the skin. You may also notice a red rash with swelling of the face, lips, or lymph nodes in your neck or under your arms. Tell your care team right away if you  have any change in your eyesight. Talk to your care team if you may be pregnant. Serious birth defects can occur if you take this medication during pregnancy and for 3 months after the last dose. You will need a negative pregnancy test before starting this medication. Contraception is recommended while taking this medication and for 3 months after the last dose. Your care team can help you find the option that works for you. Do not breastfeed while taking this medication and for 3 months after the last dose. What side effects may I notice from receiving this medication? Side effects that you should report to your care team as soon as possible: Allergic reactions--skin rash, itching, hives, swelling of the face, lips, tongue, or throat Dry cough, shortness of breath or trouble breathing Eye pain, redness, irritation, or discharge with blurry or decreased vision Heart muscle inflammation--unusual weakness or fatigue, shortness of breath, chest pain, fast or irregular heartbeat, dizziness, swelling of the ankles, feet, or hands Hormone gland problems--headache, sensitivity to light, unusual weakness or fatigue, dizziness, fast or irregular heartbeat, increased sensitivity to cold or heat, excessive sweating, constipation, hair loss, increased thirst or amount of urine, tremors or shaking, irritability Infusion reactions--chest pain, shortness of breath or trouble breathing, feeling faint or lightheaded Kidney injury (glomerulonephritis)--decrease in the amount of urine, red or dark brown urine, foamy or bubbly urine, swelling of the ankles, hands, or feet Liver injury--right upper belly pain, loss of appetite, nausea, light-colored stool, dark yellow or brown urine, yellowing skin or eyes, unusual weakness or fatigue Pain, tingling, or numbness in the hands or feet, muscle weakness, change in vision, confusion or trouble speaking, loss of balance or coordination, trouble walking, seizures Rash, fever, and  swollen lymph nodes Redness, blistering, peeling, or loosening of the skin, including inside the mouth Sudden or severe stomach pain, bloody diarrhea, fever, nausea, vomiting Side effects that usually do not require medical attention (report these to your care team if they continue or are bothersome): Bone, joint, or muscle pain Diarrhea Fatigue Loss of appetite Nausea Skin rash This list may not describe all possible side effects. Call your doctor for medical advice about side effects. You may report side effects to FDA at 1-800-FDA-1088. Where should I keep my medication? This medication is given in a hospital or clinic. It will not be stored at home. NOTE: This sheet is a summary. It may not cover all possible information. If you have questions about this medicine, talk to your doctor, pharmacist, or health care provider.  2024 Elsevier/Gold Standard (2021-10-14 00:00:00)   BELOW ARE SYMPTOMS THAT SHOULD BE REPORTED IMMEDIATELY: *FEVER GREATER THAN 100.4 F (38 C) OR HIGHER *CHILLS OR SWEATING *NAUSEA AND VOMITING THAT IS NOT CONTROLLED WITH YOUR NAUSEA MEDICATION *UNUSUAL SHORTNESS OF BREATH *UNUSUAL BRUISING OR BLEEDING *URINARY PROBLEMS (pain or burning when urinating, or frequent urination) *BOWEL PROBLEMS (unusual diarrhea, constipation, pain near the anus) TENDERNESS IN MOUTH AND THROAT WITH OR WITHOUT PRESENCE OF ULCERS (sore throat, sores in mouth, or a toothache) UNUSUAL RASH, SWELLING OR PAIN  UNUSUAL VAGINAL DISCHARGE OR ITCHING   Items with * indicate a  potential emergency and should be followed up as soon as possible or go to the Emergency Department if any problems should occur.  Please show the CHEMOTHERAPY ALERT CARD or IMMUNOTHERAPY ALERT CARD at check-in to the Emergency Department and triage nurse.  Should you have questions after your visit or need to cancel or reschedule your appointment, please contact [facility] [place] - A DEPT OF [facility] [phone]  and follow the prompts.  Office hours are 8:00 a.m. to 4:30 p.m. Monday - Friday. Please note that voicemails left after 4:00 p.m. may not be returned until the following business day.  We are closed weekends and major holidays. You have access to a nurse at all times for urgent questions. Please call the main number to the clinic [phone] and follow the prompts.  For any non-urgent questions, you may also contact your provider using MyChart. We now offer e-Visits for anyone 52 and older to request care online for non-urgent symptoms. For details visit [email or web address].   Also download the MyChart app! Go to the app store, search "MyChart", open the app, select [place], and log in with your MyChart username and password.

## 2023-10-11 NOTE — Progress Notes (Signed)
 Patient presents today for Imfinizi infusion. Patient is in satisfactory condition with no new complaints voiced.  Vital signs are stable.  Labs reviewed by Dr. Cheree Cords during the office visit and all labs are within treatment parameters.  We will proceed with treatment per MD orders.   Treatment given today per MD orders. Tolerated infusion without adverse affects. Vital signs stable. No complaints at this time. Discharged from clinic ambulatory in stable condition. Alert and oriented x 3. F/U with [facility] as scheduled.

## 2023-10-11 NOTE — Patient Instructions (Addendum)
 [facility] at [facility] Discharge Instructions   You were seen and examined today by Dr. Cheree Cords.  He reviewed the results of your lab work which are normal/stable.   He reviewed the results of your CT scan which shows no evidence of the cancer.   We will proceed with your treatment today.   Return as scheduled.    Thank you for choosing [facility] at [facility] to provide your oncology and hematology care.  To afford each patient quality time with our provider, please arrive at least 15 minutes before your scheduled appointment time.   If you have a lab appointment with the Cancer Center please come in thru the Main Entrance and check in at the main information desk.  You need to re-schedule your appointment should you arrive 10 or more minutes late.  We strive to give you quality time with our providers, and arriving late affects you and other patients whose appointments are after yours.  Also, if you no show three or more times for appointments you may be dismissed from the clinic at the providers discretion.     Again, thank you for choosing [facility].  Our hope is that these requests will decrease the amount of time that you wait before being seen by our physicians.       _____________________________________________________________  Should you have questions after your visit to [facility], please contact our office at [phone] and follow the prompts.  Our office hours are 8:00 a.m. and 4:30 p.m. Monday - Friday.  Please note that voicemails left after 4:00 p.m. may not be returned until the following business day.  We are closed weekends and major holidays.  You do have access to a nurse 24-7, just call the main number to the clinic [phone] and do not press any options, hold on the line and a nurse will answer the phone.    For prescription refill requests, have your pharmacy contact our office  and allow 72 hours.    Due to Covid, you will need to wear a mask upon entering the hospital. If you do not have a mask, a mask will be given to you at the Main Entrance upon arrival. For doctor visits, patients may have 1 support person age 19 or older with them. For treatment visits, patients can not have anyone with them due to social distancing guidelines and our immunocompromised population.

## 2023-10-13 ENCOUNTER — Other Ambulatory Visit: Payer: Self-pay

## 2023-11-09 ENCOUNTER — Inpatient Hospital Stay

## 2023-11-09 ENCOUNTER — Inpatient Hospital Stay: Attending: Hematology

## 2023-11-09 ENCOUNTER — Other Ambulatory Visit: Payer: Self-pay | Admitting: Internal Medicine

## 2023-11-09 VITALS — BP 114/53 | HR 79 | Temp 98.4°F | Resp 18

## 2023-11-09 DIAGNOSIS — C3492 Malignant neoplasm of unspecified part of left bronchus or lung: Secondary | ICD-10-CM

## 2023-11-09 DIAGNOSIS — C3412 Malignant neoplasm of upper lobe, left bronchus or lung: Secondary | ICD-10-CM | POA: Diagnosis not present

## 2023-11-09 DIAGNOSIS — Z79620 Long term (current) use of immunosuppressive biologic: Secondary | ICD-10-CM | POA: Insufficient documentation

## 2023-11-09 DIAGNOSIS — Z5112 Encounter for antineoplastic immunotherapy: Secondary | ICD-10-CM | POA: Diagnosis present

## 2023-11-09 DIAGNOSIS — I2510 Atherosclerotic heart disease of native coronary artery without angina pectoris: Secondary | ICD-10-CM

## 2023-11-09 DIAGNOSIS — F17210 Nicotine dependence, cigarettes, uncomplicated: Secondary | ICD-10-CM | POA: Insufficient documentation

## 2023-11-09 DIAGNOSIS — E782 Mixed hyperlipidemia: Secondary | ICD-10-CM

## 2023-11-09 LAB — CBC WITH DIFFERENTIAL/PLATELET
Abs Immature Granulocytes: 0.02 10*3/uL (ref 0.00–0.07)
Basophils Absolute: 0 10*3/uL (ref 0.0–0.1)
Basophils Relative: 0 %
Eosinophils Absolute: 0.1 10*3/uL (ref 0.0–0.5)
Eosinophils Relative: 2 %
HCT: 42.4 % (ref 39.0–52.0)
Hemoglobin: 14.6 g/dL (ref 13.0–17.0)
Immature Granulocytes: 0 %
Lymphocytes Relative: 31 %
Lymphs Abs: 2.2 10*3/uL (ref 0.7–4.0)
MCH: 32.1 pg (ref 26.0–34.0)
MCHC: 34.4 g/dL (ref 30.0–36.0)
MCV: 93.2 fL (ref 80.0–100.0)
Monocytes Absolute: 0.6 10*3/uL (ref 0.1–1.0)
Monocytes Relative: 9 %
Neutro Abs: 4 10*3/uL (ref 1.7–7.7)
Neutrophils Relative %: 58 %
Platelets: 208 10*3/uL (ref 150–400)
RBC: 4.55 MIL/uL (ref 4.22–5.81)
RDW: 14.7 % (ref 11.5–15.5)
WBC: 7 10*3/uL (ref 4.0–10.5)
nRBC: 0 % (ref 0.0–0.2)

## 2023-11-09 LAB — COMPREHENSIVE METABOLIC PANEL WITH GFR
ALT: 35 U/L (ref 0–44)
AST: 30 U/L (ref 15–41)
Albumin: 3.9 g/dL (ref 3.5–5.0)
Alkaline Phosphatase: 76 U/L (ref 38–126)
Anion gap: 11 (ref 5–15)
BUN: 8 mg/dL (ref 8–23)
CO2: 25 mmol/L (ref 22–32)
Calcium: 9.3 mg/dL (ref 8.9–10.3)
Chloride: 101 mmol/L (ref 98–111)
Creatinine, Ser: 0.68 mg/dL (ref 0.61–1.24)
GFR, Estimated: >60 mL/min (ref >60)
Glucose, Bld: 101 mg/dL — ABNORMAL HIGH (ref 70–99)
Potassium: 3.9 mmol/L (ref 3.5–5.1)
Sodium: 137 mmol/L (ref 135–145)
Total Bilirubin: 0.6 mg/dL (ref 0.0–1.2)
Total Protein: 7.4 g/dL (ref 6.5–8.1)

## 2023-11-09 LAB — TSH: TSH: 3.406 u[IU]/mL (ref 0.350–4.500)

## 2023-11-09 LAB — MAGNESIUM: Magnesium: 1.9 mg/dL (ref 1.7–2.4)

## 2023-11-09 MED ORDER — SODIUM CHLORIDE 0.9 % IV SOLN: Freq: Once | INTRAVENOUS | Status: AC

## 2023-11-09 MED ORDER — SODIUM CHLORIDE 0.9 % IV SOLN
1500.0000 mg | Freq: Once | INTRAVENOUS | Status: AC
  Administered 2023-11-09: 1500 mg via INTRAVENOUS
  Filled 2023-11-09: qty 30

## 2023-11-09 MED ORDER — HEPARIN SOD (PORK) LOCK FLUSH 100 UNIT/ML IV SOLN
500.0000 [IU] | Freq: Once | INTRAVENOUS | Status: AC | PRN
  Administered 2023-11-09: 500 [IU]

## 2023-11-09 MED ORDER — SODIUM CHLORIDE 0.9% FLUSH
10.0000 mL | INTRAVENOUS | Status: DC | PRN
  Administered 2023-11-09: 10 mL

## 2023-11-09 MED ORDER — SODIUM CHLORIDE 0.9% FLUSH
10.0000 mL | Freq: Once | INTRAVENOUS | Status: AC
  Administered 2023-11-09: 10 mL via INTRAVENOUS

## 2023-11-09 NOTE — Progress Notes (Signed)

## 2023-11-09 NOTE — Patient Instructions (Signed)
 [facility] - A DEPT OF [facility]  Discharge Instructions: Thank you for choosing [facility] to provide your oncology and hematology care.  If you have a lab appointment with the Cancer Center - please note that after April 8th, 2024, all labs will be drawn in the cancer center.  You do not have to check in or register with the main entrance as you have in the past but will complete your check-in in the cancer center.  Wear comfortable clothing and clothing appropriate for easy access to any Portacath or PICC line.   We strive to give you quality time with your provider. You may need to reschedule your appointment if you arrive late (15 or more minutes).  Arriving late affects you and other patients whose appointments are after yours.  Also, if you miss three or more appointments without notifying the office, you may be dismissed from the clinic at the provider's discretion.      For prescription refill requests, have your pharmacy contact our office and allow 72 hours for refills to be completed.    Today you received the following chemotherapy and/or immunotherapy agents Imfinzi      To help prevent nausea and vomiting after your treatment, we encourage you to take your nausea medication as directed.  BELOW ARE SYMPTOMS THAT SHOULD BE REPORTED IMMEDIATELY: *FEVER GREATER THAN 100.4 F (38 C) OR HIGHER *CHILLS OR SWEATING *NAUSEA AND VOMITING THAT IS NOT CONTROLLED WITH YOUR NAUSEA MEDICATION *UNUSUAL SHORTNESS OF BREATH *UNUSUAL BRUISING OR BLEEDING *URINARY PROBLEMS (pain or burning when urinating, or frequent urination) *BOWEL PROBLEMS (unusual diarrhea, constipation, pain near the anus) TENDERNESS IN MOUTH AND THROAT WITH OR WITHOUT PRESENCE OF ULCERS (sore throat, sores in mouth, or a toothache) UNUSUAL RASH, SWELLING OR PAIN  UNUSUAL VAGINAL DISCHARGE OR ITCHING   Items with * indicate a potential emergency and should be followed up  as soon as possible or go to the Emergency Department if any problems should occur.  Please show the CHEMOTHERAPY ALERT CARD or IMMUNOTHERAPY ALERT CARD at check-in to the Emergency Department and triage nurse.  Should you have questions after your visit or need to cancel or reschedule your appointment, please contact [facility] [place] - A DEPT OF [facility] [phone]  and follow the prompts.  Office hours are 8:00 a.m. to 4:30 p.m. Monday - Friday. Please note that voicemails left after 4:00 p.m. may not be returned until the following business day.  We are closed weekends and major holidays. You have access to a nurse at all times for urgent questions. Please call the main number to the clinic [phone] and follow the prompts.  For any non-urgent questions, you may also contact your provider using MyChart. We now offer e-Visits for anyone 42 and older to request care online for non-urgent symptoms. For details visit [email or web address].   Also download the MyChart app! Go to the app store, search "MyChart", open the app, select [place], and log in with your MyChart username and password.

## 2023-11-10 ENCOUNTER — Other Ambulatory Visit (HOSPITAL_COMMUNITY): Payer: Self-pay

## 2023-11-10 ENCOUNTER — Encounter (HOSPITAL_COMMUNITY): Payer: Self-pay | Admitting: Hematology

## 2023-11-15 ENCOUNTER — Ambulatory Visit: Admitting: Internal Medicine

## 2023-11-25 ENCOUNTER — Ambulatory Visit: Admitting: Internal Medicine

## 2023-11-26 ENCOUNTER — Encounter: Payer: Self-pay | Admitting: Internal Medicine

## 2023-11-26 ENCOUNTER — Ambulatory Visit: Admitting: Internal Medicine

## 2023-11-26 VITALS — BP 138/84 | HR 94 | Ht 66.0 in | Wt 178.2 lb

## 2023-11-26 DIAGNOSIS — E785 Hyperlipidemia, unspecified: Secondary | ICD-10-CM | POA: Diagnosis not present

## 2023-11-26 DIAGNOSIS — I5022 Chronic systolic (congestive) heart failure: Secondary | ICD-10-CM

## 2023-11-26 DIAGNOSIS — I10 Essential (primary) hypertension: Secondary | ICD-10-CM

## 2023-11-26 DIAGNOSIS — C3492 Malignant neoplasm of unspecified part of left bronchus or lung: Secondary | ICD-10-CM

## 2023-11-26 DIAGNOSIS — R7303 Prediabetes: Secondary | ICD-10-CM

## 2023-11-26 DIAGNOSIS — E782 Mixed hyperlipidemia: Secondary | ICD-10-CM

## 2023-11-26 DIAGNOSIS — I2510 Atherosclerotic heart disease of native coronary artery without angina pectoris: Secondary | ICD-10-CM

## 2023-11-26 MED ORDER — ALBUTEROL SULFATE HFA 108 (90 BASE) MCG/ACT IN AERS: 2.0000 | INHALATION_SPRAY | Freq: Four times a day (QID) | RESPIRATORY_TRACT | 2 refills | Status: DC | PRN

## 2023-11-26 MED ORDER — ATORVASTATIN CALCIUM 80 MG PO TABS: 80.0000 mg | ORAL_TABLET | Freq: Every day | ORAL | 3 refills | Status: AC

## 2023-11-26 MED ORDER — METOPROLOL SUCCINATE ER 25 MG PO TB24: 25.0000 mg | ORAL_TABLET | Freq: Every day | ORAL | 3 refills | Status: DC

## 2023-11-26 NOTE — Assessment & Plan Note (Signed)
 Lipid panel last updated in May 2024.  He is currently prescribed atorvastatin  80 mg daily.  Repeat lipid panel ordered today.

## 2023-11-26 NOTE — Assessment & Plan Note (Signed)
 Adequately controlled with metoprolol .  He is additionally prescribed lisinopril  but has not been taking it recently.  Will discontinue since his blood pressure is controlled off lisinopril .

## 2023-11-26 NOTE — Assessment & Plan Note (Signed)
 He appears euvolemic on exam.  He has been taking metoprolol  as prescribed but has not been taking Farxiga  or lisinopril .

## 2023-11-26 NOTE — Patient Instructions (Signed)
 It was a pleasure to see you today.  Thank you for giving [place] the opportunity to be involved in your care.  Below is a brief recap of your visit and next steps.  We will plan to see you again in 6 months.  Summary No medication changes today Refills provided Repeat labs Follow up in 6 months

## 2023-11-26 NOTE — Assessment & Plan Note (Signed)
 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  Currently undergoing immunotherapy treatment (durvalumab ).  Last infusion 5/27.  He is scheduled for his next infusion and oncology follow-up on 6/23.  Tolerating well.  Labs stable.  His latest imaging from April showed stable appearance of the left upper lobe mass with no evidence of progression.

## 2023-11-26 NOTE — Progress Notes (Signed)
 Established Patient Office Visit  Subjective   Patient ID: Rodney Mahoney, male    DOB: [birth year]  Age: 63 y.o. MRN: [id number]  Chief Complaint  Patient presents with   Care Management    Follow up    Cough    Coughs a lot, gets dizzy when coughing   [name] returns to care today for follow-up.  He was last evaluated by me in August 2024 for an acute visit endorsing edema of the lower eyelids of both eyes.  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  He is currently undergoing immunotherapy (durvalumab ).  His last treatment was 5/27.  Today he reports feeling fairly well.  He endorses persistent coughing episodes that are typically nonproductive.  He does not have any acute concerns to discuss today.  Past Medical History:  Diagnosis Date   Bronchitis    CAD (coronary artery disease)    a. cath 11/26/2014 EF 45%, moderate to severe inferior hypokinesis, 90% small D1, 100% mid to distal RCA with minimal collateral, 40% mid to distal LCx. Medical therapy as RCA occlusion appears to be completed event; DES to mid circumflex October 2019   Essential hypertension    Hyperlipidemia    Ischemic cardiomyopathy    NSTEMI (non-ST elevated myocardial infarction) (HCC)    11/26/14, 01/31/18   Squamous cell lung cancer, left [facility])    Past Surgical History:  Procedure Laterality Date   BRONCHIAL BIOPSY  12/01/2022   Procedure: BRONCHIAL BIOPSIES;  Surgeon: Wilfredo Hanly, MD;  Location: [facility] ENDOSCOPY;  Service: Pulmonary;;   BRONCHIAL BRUSHINGS  12/01/2022   Procedure: BRONCHIAL BRUSHINGS;  Surgeon: Wilfredo Hanly, MD;  Location[facility] ENDOSCOPY;  Service: Pulmonary;;   BRONCHIAL WASHINGS  12/01/2022   Procedure: BRONCHIAL WASHINGS;  Surgeon: Wilfredo Hanly, MD;  Location: [facility] ENDOSCOPY;  Service: Pulmonary;;   CARDIAC CATHETERIZATION N/A 11/26/2014   Procedure: Left Heart Cath and Coronary  Angiography;  Surgeon: Peter M Swaziland, MD;  Location: [facility] INVASIVE CV LAB;  Service: Cardiovascular;  Laterality: N/A;   COLONOSCOPY     per patient: done in [place], age 39, couple of polyps.   COLONOSCOPY WITH PROPOFOL  N/A 10/08/2022   Procedure: COLONOSCOPY WITH PROPOFOL ;  Surgeon: Vinetta Greening, DO;  Location: [facility] ENDO SUITE;  Service: Endoscopy;  Laterality: N/A;  11:00 am   CORONARY STENT INTERVENTION N/A 01/31/2018   Procedure: CORONARY STENT INTERVENTION;  Surgeon: Avanell Leigh, MD;  Location: [facility] INVASIVE CV LAB;  Service: Cardiovascular;  Laterality: N/A;   HEMOSTASIS CONTROL  12/01/2022   Procedure: HEMOSTASIS CONTROL;  Surgeon: Wilfredo Hanly, MD;  Location: [facility] ENDOSCOPY;  Service: Pulmonary;;  cold saline   IR IMAGING GUIDED PORT INSERTION  12/23/2022   LEFT HEART CATH AND CORONARY ANGIOGRAPHY N/A 01/31/2018   Procedure: LEFT HEART CATH AND CORONARY ANGIOGRAPHY;  Surgeon: Avanell Leigh, MD;  Location: [facility] INVASIVE CV LAB;  Service: Cardiovascular;  Laterality: N/A;   NECK SURGERY  2005   VIDEO BRONCHOSCOPY N/A 12/01/2022   Procedure: VIDEO BRONCHOSCOPY WITH FLUORO;  Surgeon: Wilfredo Hanly, MD;  Location: [facility] ENDOSCOPY;  Service: Pulmonary;  Laterality: N/A;   Social History   Tobacco Use   Smoking status: Every Day    Current packs/day: 1.00    Average packs/day: 1 pack/day for 42.0 years (42.0 ttl pk-yrs)    Types: Cigarettes   Smokeless tobacco: Never   Tobacco  comments:    He used to quit and start back. He has not quit for the past 5 years.   Vaping Use   Vaping status: Never Used  Substance Use Topics   Alcohol use: Yes    Comment: 10 cans of beer   Drug use: Yes    Types: Marijuana    Comment: smokes week here and there   Family History  Problem Relation Age of Onset   Diabetes Mother    Heart attack Father    Cancer Sister    Hypertension Brother    Colon cancer Neg Hx    Prostate cancer Neg Hx    Lung cancer Neg Hx    No Known  Allergies  Review of Systems  Constitutional:  Negative for chills and fever.  HENT:  Negative for sore throat.   Respiratory:  Positive for cough. Negative for sputum production and shortness of breath.   Cardiovascular:  Negative for chest pain, palpitations and leg swelling.  Gastrointestinal:  Negative for abdominal pain, blood in stool, constipation, diarrhea, nausea and vomiting.  Genitourinary:  Negative for dysuria and hematuria.  Musculoskeletal:  Negative for myalgias.  Skin:  Negative for itching and rash.  Neurological:  Negative for dizziness and headaches.  Psychiatric/Behavioral:  Negative for depression and suicidal ideas.      Objective:     BP 138/84   Pulse 94   Ht 5' 6 (1.676 m)   Wt 178 lb 3.2 oz (80.8 kg)   SpO2 91%   BMI 28.76 kg/m  BP Readings from Last 3 Encounters:  11/26/23 138/84  11/09/23 (!) 114/53  11/09/23 120/83   Physical Exam Vitals reviewed.  Constitutional:      General: He is not in acute distress.    Appearance: Normal appearance. He is not ill-appearing.  HENT:     Head: Normocephalic and atraumatic.     Right Ear: External ear normal.     Left Ear: External ear normal.     Nose: Nose normal. No congestion or rhinorrhea.     Mouth/Throat:     Mouth: Mucous membranes are moist.     Pharynx: Oropharynx is clear.   Eyes:     General: No scleral icterus.    Extraocular Movements: Extraocular movements intact.     Conjunctiva/sclera: Conjunctivae normal.     Pupils: Pupils are equal, round, and reactive to light.    Cardiovascular:     Rate and Rhythm: Normal rate and regular rhythm.     Pulses: Normal pulses.     Heart sounds: Normal heart sounds. No murmur heard. Pulmonary:     Effort: Pulmonary effort is normal.     Breath sounds: Wheezing (Bilateral inspiratory wheezes) present. No rhonchi or rales.  Abdominal:     General: Abdomen is flat. Bowel sounds are normal. There is no distension.     Palpations: Abdomen is  soft.     Tenderness: There is no abdominal tenderness.   Musculoskeletal:        General: No swelling or deformity. Normal range of motion.     Cervical back: Normal range of motion.   Skin:    General: Skin is warm and dry.     Capillary Refill: Capillary refill takes less than 2 seconds.   Neurological:     General: No focal deficit present.     Mental Status: He is alert and oriented to person, place, and time.     Motor: No weakness.  Psychiatric:        Mood and Affect: Mood normal.        Behavior: Behavior normal.        Thought Content: Thought content normal.   Last CBC Lab Results  Component Value Date   WBC 7.0 11/09/2023   HGB 14.6 11/09/2023   HCT 42.4 11/09/2023   MCV 93.2 11/09/2023   MCH 32.1 11/09/2023   RDW 14.7 11/09/2023   PLT 208 11/09/2023   Last metabolic panel Lab Results  Component Value Date   GLUCOSE 101 (H) 11/09/2023   NA 137 11/09/2023   K 3.9 11/09/2023   CL 101 11/09/2023   CO2 25 11/09/2023   BUN 8 11/09/2023   CREATININE 0.68 11/09/2023   GFRNONAA >60 11/09/2023   CALCIUM  9.3 11/09/2023   PHOS 4.4 12/01/2022   PROT 7.4 11/09/2023   ALBUMIN 3.9 11/09/2023   LABGLOB 3.1 10/28/2022   AGRATIO 1.3 10/28/2022   BILITOT 0.6 11/09/2023   ALKPHOS 76 11/09/2023   AST 30 11/09/2023   ALT 35 11/09/2023   ANIONGAP 11 11/09/2023   Last lipids Lab Results  Component Value Date   CHOL 116 10/28/2022   HDL 39 (L) 10/28/2022   LDLCALC 59 10/28/2022   TRIG 93 10/28/2022   CHOLHDL 3.0 10/28/2022   Last hemoglobin A1c Lab Results  Component Value Date   HGBA1C 6.3 (H) 10/28/2022   Last thyroid  functions Lab Results  Component Value Date   TSH 3.406 11/09/2023   T4TOTAL 5.0 06/21/2023   Last vitamin D  Lab Results  Component Value Date   VD25OH 34.7 10/28/2022   Last vitamin B12 and Folate Lab Results  Component Value Date   VITAMINB12 693 10/28/2022   FOLATE 5.6 10/28/2022     Assessment & Plan:   Problem List  Items Addressed This Visit       Essential hypertension   Adequately controlled with metoprolol .  He is additionally prescribed lisinopril  but has not been taking it recently.  Will discontinue since his blood pressure is controlled off lisinopril .      Chronic systolic CHF (congestive heart failure) (HCC)   He appears euvolemic on exam.  He has been taking metoprolol  as prescribed but has not been taking Farxiga  or lisinopril .      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  Currently undergoing immunotherapy treatment (durvalumab ).  Last infusion 5/27.  He is scheduled for his next infusion and oncology follow-up on 6/23.  Tolerating well.  Labs stable.  His latest imaging from April showed stable appearance of the left upper lobe mass with no evidence of progression.      Hyperlipidemia - Primary   Lipid panel last updated in May 2024.  He is currently prescribed atorvastatin  80 mg daily.  Repeat lipid panel ordered today.      Prediabetes   A1c 6.3 on labs from May 2024.  Repeat A1c ordered today.      Return in about 6 months (around 05/27/2024).   Tobi Fortes, MD

## 2023-11-26 NOTE — Assessment & Plan Note (Signed)
 A1c 6.3 on labs from May 2024.  Repeat A1c ordered today.

## 2023-11-27 ENCOUNTER — Other Ambulatory Visit: Payer: Self-pay

## 2023-11-27 ENCOUNTER — Ambulatory Visit: Payer: Self-pay | Admitting: Internal Medicine

## 2023-11-27 LAB — B12 AND FOLATE PANEL
Folate: 7.6 ng/mL (ref >3.0)
Vitamin B-12: 618 pg/mL (ref 232–1245)

## 2023-11-27 LAB — HEMOGLOBIN A1C
Est. average glucose Bld gHb Est-mCnc: 131 mg/dL
Hgb A1c MFr Bld: 6.2 % — ABNORMAL HIGH (ref 4.8–5.6)

## 2023-11-27 LAB — LIPID PANEL
Chol/HDL Ratio: 2.8 ratio (ref 0.0–5.0)
Cholesterol, Total: 111 mg/dL (ref 100–199)
HDL: 39 mg/dL — ABNORMAL LOW (ref >39)
LDL Chol Calc (NIH): 55 mg/dL (ref 0–99)
Triglycerides: 85 mg/dL (ref 0–149)
VLDL Cholesterol Cal: 17 mg/dL (ref 5–40)

## 2023-11-27 LAB — VITAMIN D 25 HYDROXY (VIT D DEFICIENCY, FRACTURES): Vit D, 25-Hydroxy: 22.6 ng/mL — ABNORMAL LOW (ref 30.0–100.0)

## 2023-12-03 ENCOUNTER — Other Ambulatory Visit: Payer: Self-pay | Admitting: Internal Medicine

## 2023-12-03 DIAGNOSIS — E782 Mixed hyperlipidemia: Secondary | ICD-10-CM

## 2023-12-06 ENCOUNTER — Other Ambulatory Visit: Payer: Self-pay | Admitting: Internal Medicine

## 2023-12-06 ENCOUNTER — Inpatient Hospital Stay

## 2023-12-06 ENCOUNTER — Inpatient Hospital Stay: Attending: Hematology | Admitting: Hematology

## 2023-12-06 VITALS — BP 126/77 | HR 80 | Temp 97.9°F | Resp 18

## 2023-12-06 DIAGNOSIS — Z809 Family history of malignant neoplasm, unspecified: Secondary | ICD-10-CM | POA: Insufficient documentation

## 2023-12-06 DIAGNOSIS — Z923 Personal history of irradiation: Secondary | ICD-10-CM | POA: Insufficient documentation

## 2023-12-06 DIAGNOSIS — F17210 Nicotine dependence, cigarettes, uncomplicated: Secondary | ICD-10-CM | POA: Insufficient documentation

## 2023-12-06 DIAGNOSIS — C3492 Malignant neoplasm of unspecified part of left bronchus or lung: Secondary | ICD-10-CM

## 2023-12-06 DIAGNOSIS — C3412 Malignant neoplasm of upper lobe, left bronchus or lung: Secondary | ICD-10-CM | POA: Diagnosis not present

## 2023-12-06 DIAGNOSIS — I10 Essential (primary) hypertension: Secondary | ICD-10-CM | POA: Diagnosis not present

## 2023-12-06 DIAGNOSIS — Z79620 Long term (current) use of immunosuppressive biologic: Secondary | ICD-10-CM | POA: Diagnosis not present

## 2023-12-06 DIAGNOSIS — E782 Mixed hyperlipidemia: Secondary | ICD-10-CM

## 2023-12-06 DIAGNOSIS — G629 Polyneuropathy, unspecified: Secondary | ICD-10-CM | POA: Diagnosis not present

## 2023-12-06 DIAGNOSIS — Z5112 Encounter for antineoplastic immunotherapy: Secondary | ICD-10-CM | POA: Diagnosis present

## 2023-12-06 LAB — CBC WITH DIFFERENTIAL/PLATELET
Abs Immature Granulocytes: 0.02 10*3/uL (ref 0.00–0.07)
Basophils Absolute: 0 10*3/uL (ref 0.0–0.1)
Basophils Relative: 1 %
Eosinophils Absolute: 0.1 10*3/uL (ref 0.0–0.5)
Eosinophils Relative: 2 %
HCT: 42.8 % (ref 39.0–52.0)
Hemoglobin: 14.1 g/dL (ref 13.0–17.0)
Immature Granulocytes: 0 %
Lymphocytes Relative: 37 %
Lymphs Abs: 2 10*3/uL (ref 0.7–4.0)
MCH: 30.7 pg (ref 26.0–34.0)
MCHC: 32.9 g/dL (ref 30.0–36.0)
MCV: 93 fL (ref 80.0–100.0)
Monocytes Absolute: 0.6 10*3/uL (ref 0.1–1.0)
Monocytes Relative: 11 %
Neutro Abs: 2.7 10*3/uL (ref 1.7–7.7)
Neutrophils Relative %: 49 %
Platelets: 221 10*3/uL (ref 150–400)
RBC: 4.6 MIL/uL (ref 4.22–5.81)
RDW: 15.1 % (ref 11.5–15.5)
WBC: 5.5 10*3/uL (ref 4.0–10.5)
nRBC: 0 % (ref 0.0–0.2)

## 2023-12-06 LAB — COMPREHENSIVE METABOLIC PANEL WITH GFR
ALT: 36 U/L (ref 0–44)
AST: 32 U/L (ref 15–41)
Albumin: 4.1 g/dL (ref 3.5–5.0)
Alkaline Phosphatase: 75 U/L (ref 38–126)
Anion gap: 14 (ref 5–15)
BUN: 6 mg/dL — ABNORMAL LOW (ref 8–23)
CO2: 23 mmol/L (ref 22–32)
Calcium: 9.2 mg/dL (ref 8.9–10.3)
Chloride: 102 mmol/L (ref 98–111)
Creatinine, Ser: 0.68 mg/dL (ref 0.61–1.24)
GFR, Estimated: >60 mL/min (ref >60)
Glucose, Bld: 85 mg/dL (ref 70–99)
Potassium: 3.6 mmol/L (ref 3.5–5.1)
Sodium: 139 mmol/L (ref 135–145)
Total Bilirubin: 0.7 mg/dL (ref 0.0–1.2)
Total Protein: 7.3 g/dL (ref 6.5–8.1)

## 2023-12-06 LAB — MAGNESIUM: Magnesium: 2.2 mg/dL (ref 1.7–2.4)

## 2023-12-06 LAB — TSH: TSH: 2.476 u[IU]/mL (ref 0.350–4.500)

## 2023-12-06 MED ORDER — SODIUM CHLORIDE 0.9% FLUSH
10.0000 mL | Freq: Once | INTRAVENOUS | Status: AC
  Administered 2023-12-06: 10 mL via INTRAVENOUS

## 2023-12-06 MED ORDER — SODIUM CHLORIDE 0.9% FLUSH
10.0000 mL | INTRAVENOUS | Status: DC | PRN
  Administered 2023-12-06: 10 mL

## 2023-12-06 MED ORDER — HEPARIN SOD (PORK) LOCK FLUSH 100 UNIT/ML IV SOLN
500.0000 [IU] | Freq: Once | INTRAVENOUS | Status: AC | PRN
  Administered 2023-12-06: 500 [IU]

## 2023-12-06 MED ORDER — SODIUM CHLORIDE 0.9 % IV SOLN
1500.0000 mg | Freq: Once | INTRAVENOUS | Status: AC
  Administered 2023-12-06: 1500 mg via INTRAVENOUS
  Filled 2023-12-06: qty 30

## 2023-12-06 MED ORDER — SODIUM CHLORIDE 0.9 % IV SOLN: Freq: Once | INTRAVENOUS | Status: AC

## 2023-12-06 NOTE — Patient Instructions (Signed)
 [facility] [place] - A DEPT OF [facility]  Discharge Instructions: Thank you for choosing [facility] to provide your oncology and hematology care.  If you have a lab appointment with the Cancer Center - please note that after April 8th, 2024, all labs will be drawn in the cancer center.  You do not have to check in or register with the main entrance as you have in the past but will complete your check-in in the cancer center.  Wear comfortable clothing and clothing appropriate for easy access to any Portacath or PICC line.   We strive to give you quality time with your provider. You may need to reschedule your appointment if you arrive late (15 or more minutes).  Arriving late affects you and other patients whose appointments are after yours.  Also, if you miss three or more appointments without notifying the office, you may be dismissed from the clinic at the provider's discretion.      For prescription refill requests, have your pharmacy contact our office and allow 72 hours for refills to be completed.    Today you received the following chemotherapy and/or immunotherapy agents Imfinzi   To help prevent nausea and vomiting after your treatment, we encourage you to take your nausea medication as directed.  Durvalumab Injection What is this medication? DURVALUMAB (dur VAL ue mab) treats some types of cancer. It works by helping your immune system slow or stop the spread of cancer cells. It is a monoclonal antibody. This medicine may be used for other purposes; ask your health care provider or pharmacist if you have questions. COMMON BRAND NAME(S): IMFINZI What should I tell my care team before I take this medication? They need to know if you have any of these conditions: Allogeneic stem cell transplant (uses someone else's stem cells) Autoimmune diseases, such as Crohn disease, ulcerative colitis, lupus History of chest radiation Nervous system  problems, such as Guillain-Barre syndrome, myasthenia gravis Organ transplant An unusual or allergic reaction to durvalumab, other medications, foods, dyes, or preservatives Pregnant or trying to get pregnant Breast-feeding How should I use this medication? This medication is infused into a vein. It is given by your care team in a hospital or clinic setting. A special MedGuide will be given to you before each treatment. Be sure to read this information carefully each time. Talk to your care team about the use of this medication in children. Special care may be needed. Overdosage: If you think you have taken too much of this medicine contact a poison control center or emergency room at once. NOTE: This medicine is only for you. Do not share this medicine with others. What if I miss a dose? Keep appointments for follow-up doses. It is important not to miss your dose. Call your care team if you are unable to keep an appointment. What may interact with this medication? Interactions have not been studied. This list may not describe all possible interactions. Give your health care provider a list of all the medicines, herbs, non-prescription drugs, or dietary supplements you use. Also tell them if you smoke, drink alcohol, or use illegal drugs. Some items may interact with your medicine. What should I watch for while using this medication? Your condition will be monitored carefully while you are receiving this medication. You may need blood work while taking this medication. This medication may cause serious skin reactions. They can happen weeks to months after starting the medication. Contact  your care team right away if you notice fevers or flu-like symptoms with a rash. The rash may be red or purple and then turn into blisters or peeling of the skin. You may also notice a red rash with swelling of the face, lips, or lymph nodes in your neck or under your arms. Tell your care team right away if you  have any change in your eyesight. Talk to your care team if you may be pregnant. Serious birth defects can occur if you take this medication during pregnancy and for 3 months after the last dose. You will need a negative pregnancy test before starting this medication. Contraception is recommended while taking this medication and for 3 months after the last dose. Your care team can help you find the option that works for you. Do not breastfeed while taking this medication and for 3 months after the last dose. What side effects may I notice from receiving this medication? Side effects that you should report to your care team as soon as possible: Allergic reactions--skin rash, itching, hives, swelling of the face, lips, tongue, or throat Dry cough, shortness of breath or trouble breathing Eye pain, redness, irritation, or discharge with blurry or decreased vision Heart muscle inflammation--unusual weakness or fatigue, shortness of breath, chest pain, fast or irregular heartbeat, dizziness, swelling of the ankles, feet, or hands Hormone gland problems--headache, sensitivity to light, unusual weakness or fatigue, dizziness, fast or irregular heartbeat, increased sensitivity to cold or heat, excessive sweating, constipation, hair loss, increased thirst or amount of urine, tremors or shaking, irritability Infusion reactions--chest pain, shortness of breath or trouble breathing, feeling faint or lightheaded Kidney injury (glomerulonephritis)--decrease in the amount of urine, red or dark brown urine, foamy or bubbly urine, swelling of the ankles, hands, or feet Liver injury--right upper belly pain, loss of appetite, nausea, light-colored stool, dark yellow or brown urine, yellowing skin or eyes, unusual weakness or fatigue Pain, tingling, or numbness in the hands or feet, muscle weakness, change in vision, confusion or trouble speaking, loss of balance or coordination, trouble walking, seizures Rash, fever, and  swollen lymph nodes Redness, blistering, peeling, or loosening of the skin, including inside the mouth Sudden or severe stomach pain, bloody diarrhea, fever, nausea, vomiting Side effects that usually do not require medical attention (report these to your care team if they continue or are bothersome): Bone, joint, or muscle pain Diarrhea Fatigue Loss of appetite Nausea Skin rash This list may not describe all possible side effects. Call your doctor for medical advice about side effects. You may report side effects to FDA at 1-800-FDA-1088. Where should I keep my medication? This medication is given in a hospital or clinic. It will not be stored at home. NOTE: This sheet is a summary. It may not cover all possible information. If you have questions about this medicine, talk to your doctor, pharmacist, or health care provider.  2024 Elsevier/Gold Standard (2021-10-14 00:00:00)   BELOW ARE SYMPTOMS THAT SHOULD BE REPORTED IMMEDIATELY: *FEVER GREATER THAN 100.4 F (38 C) OR HIGHER *CHILLS OR SWEATING *NAUSEA AND VOMITING THAT IS NOT CONTROLLED WITH YOUR NAUSEA MEDICATION *UNUSUAL SHORTNESS OF BREATH *UNUSUAL BRUISING OR BLEEDING *URINARY PROBLEMS (pain or burning when urinating, or frequent urination) *BOWEL PROBLEMS (unusual diarrhea, constipation, pain near the anus) TENDERNESS IN MOUTH AND THROAT WITH OR WITHOUT PRESENCE OF ULCERS (sore throat, sores in mouth, or a toothache) UNUSUAL RASH, SWELLING OR PAIN  UNUSUAL VAGINAL DISCHARGE OR ITCHING   Items with * indicate a  potential emergency and should be followed up as soon as possible or go to the Emergency Department if any problems should occur.  Please show the CHEMOTHERAPY ALERT CARD or IMMUNOTHERAPY ALERT CARD at check-in to the Emergency Department and triage nurse.  Should you have questions after your visit or need to cancel or reschedule your appointment, please contact [facility] [place] - A DEPT OF [facility] [phone]  and follow the prompts.  Office hours are 8:00 a.m. to 4:30 p.m. Monday - Friday. Please note that voicemails left after 4:00 p.m. may not be returned until the following business day.  We are closed weekends and major holidays. You have access to a nurse at all times for urgent questions. Please call the main number to the clinic [phone] and follow the prompts.  For any non-urgent questions, you may also contact your provider using MyChart. We now offer e-Visits for anyone 52 and older to request care online for non-urgent symptoms. For details visit [email or web address].   Also download the MyChart app! Go to the app store, search "MyChart", open the app, select [place], and log in with your MyChart username and password.

## 2023-12-06 NOTE — Progress Notes (Signed)
Patient presents today for Imfinzi infusion. Patient is in satisfactory condition with no new complaints voiced.  Vital signs are stable.  Labs reviewed by Dr. Ellin Saba during the office visit and all labs are within treatment parameters.  We will proceed with treatment per MD orders.   Treatment given today per MD orders. Tolerated infusion without adverse affects. Vital signs stable. No complaints at this time. Discharged from clinic ambulatory in stable condition. Alert and oriented x 3. F/U with [facility] as scheduled.

## 2023-12-06 NOTE — Progress Notes (Signed)
 [facility] [address], [place] [address]    Clinic Day:  12/06/2023  Referring physician: Rogers Hai, MD  Patient Care Team: Bevely Doffing, FNP as PCP - General (Family Medicine) Debera Jayson MATSU, MD as PCP - Cardiology (Cardiology) Clemencia Pac, MD as Consulting Physician (Internal Medicine) Celestia Joesph SQUIBB, RN as Oncology Nurse Navigator (Medical Oncology) Rogers Hai, MD as Medical Oncologist (Medical Oncology)   ASSESSMENT & PLAN:   Assessment: 1.  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  No weight loss or hemoptysis. - CT angio chest (11/30/2022): Left upper lobe mass with central necrosis and bulky mediastinal, left hilar and left lower neck adenopathy and lytic lesion in the right superior sternum. - Bronchoscopy (12/01/2022): Narrowing of LUL apical and posterior segment.  Otherwise normal-appearing bronchial trees. - LUL upper lobe biopsy (12/01/2022): Invasive moderately differentiated squamous cell carcinoma. - PET scan (12/16/2022): Left supraclavicular 3.2 cm mass, direct extension from mediastinal adenopathy.  Small right supraclavicular lymph node.  Large left lung mass 7.4 x 6.1 cm extending into the left hilum and AP window.  Prevascular and paratracheal lymph nodes and right paratracheal node.  Extensive involvement of sternum manubrium and first rib close to manubrium.  Soft tissue extension into the medial border of the overlying pectoralis muscle.  Discrete hypermetabolic right upper lobe nodule 10 mm.  No abdominal or pelvic metastatic disease. - He has received 1 dose of concurrent chemo XRT on 12/23/2022. - Left lung XRT 12 Gray in 4 fractions and left bronchus radiation 18 Gray in 6 fractions completed - [employer] (01/07/2023): PIK3CA, T p53, MSI high not detected. - NGS (01/03/2023): TMB-high.  MSI-stable.  PIK3CA and T p53  mutations present.  No other targetable mutations.  PD-L1 TPS 0%.  HER2 IHC 0. - Carboplatin , paclitaxel , durvalumab  and tremelimumab  cycle 1 started on 01/13/2023, cycle 6 completed on 04/28/2023, maintenance Imfinzi  started on 05/20/2023   2.  Social/family history: - Lives at home by himself.  His wife helps him but they are separated.  He is independent of ADLs and IADLs.  Retired from work in April 2024.  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  Current active smoker, 1 pack/day started at age 30 and quit at the time of diagnosis. - Sister had cancer.  2 paternal cousins had cancer.    Plan: 1.  Stage IV (T4 N2 M1) squamous cell lung cancer: - Denies immunotherapy side effects. - CT chest (10/04/2023): Left upper lobe lung mass is stable.  No evidence of progression or recurrence.  Mild prevascular soft tissue measuring up to 11 mm stable. - labs today: WNL. -proceed with treatment today. RTC 4 weeks with CT chest.    2.  Peripheral neuropathy: - Fingertip numbness and toe numbness is constant and stable.   3.  Hypertension: - Continue Toprol -XL daily.  BP is normal today.      No orders of the defined types were placed in this encounter.     LILLETTE Verneta SAUNDERS Teague,acting as a Neurosurgeon for Hai Rogers, MD.,have documented all relevant documentation on the behalf of Hai Rogers, MD,as directed by  Hai Rogers, MD while in the presence of Hai Rogers, MD.  I, Hai Rogers MD, have reviewed the above documentation for accuracy and completeness, and I agree with the above.      [address] [place]   6/23/20259:50 AM  CHIEF COMPLAINT:   Diagnosis: metastatic squamous cell lung cancer    Cancer Staging  Squamous cell lung cancer, left (HCC) Staging form: Lung, AJCC 8th Edition - Clinical stage from 12/22/2022: Stage IVA (cT4, cN3, cM1a) - Unsigned    Prior Therapy: 1. radiation therapy 12/15/22 - 12/29/22, with one dose  carboplatin /taxol  on 12/25/22  2. carboplatin /paclitaxel , durvalumab , tremelimumab , 6 cycles, 01/13/23 - 04/29/23  Current Therapy:  maintenance durvalumab     HISTORY OF PRESENT ILLNESS:   Oncology History  Squamous cell lung cancer, left (HCC)  12/22/2022 Initial Diagnosis   Squamous cell lung cancer, left (HCC)   12/25/2022 - 12/25/2022 Chemotherapy   Patient is on Treatment Plan : ESOPHAGUS Carboplatin  + Paclitaxel  Weekly X 6 Weeks with XRT     01/13/2023 -  Chemotherapy   Patient is on Treatment Plan : LUNG NSCLC Squamous Tremelimumab -actl (cycles 1,2,3,4,6) + Durvalumab  + PAClitaxel  + Carboplatin  q21 days x 4 cycles / Durvalumab  q28 days        INTERVAL HISTORY:   Barnie is a 63 y.o. male seen for follow-up of metastatic squamous cell lung cancer. He was last seen by me on 10/11/23.  Today, he states that he is doing well overall. His appetite level is at 80%. His energy level is at 75%.   PAST MEDICAL HISTORY:   Past Medical History: Past Medical History:  Diagnosis Date   Bronchitis    CAD (coronary artery disease)    a. cath 11/26/2014 EF 45%, moderate to severe inferior hypokinesis, 90% small D1, 100% mid to distal RCA with minimal collateral, 40% mid to distal LCx. Medical therapy as RCA occlusion appears to be completed event; DES to mid circumflex October 2019   Essential hypertension    Hyperlipidemia    Ischemic cardiomyopathy    NSTEMI (non-ST elevated myocardial infarction) (HCC)    11/26/14, 01/31/18   Squamous cell lung cancer, left [facility])     Surgical History: Past Surgical History:  Procedure Laterality Date   BRONCHIAL BIOPSY  12/01/2022   Procedure: BRONCHIAL BIOPSIES;  Surgeon: Kara Dorn NOVAK, MD;  Location: [facility] ENDOSCOPY;  Service: Pulmonary;;   BRONCHIAL BRUSHINGS  12/01/2022   Procedure: BRONCHIAL BRUSHINGS;  Surgeon: Kara Dorn NOVAK, MD;  Location: [facility] ENDOSCOPY;  Service: Pulmonary;;   BRONCHIAL WASHINGS  12/01/2022   Procedure: BRONCHIAL WASHINGS;   Surgeon: Kara Dorn NOVAK, MD;  Location: [facility] ENDOSCOPY;  Service: Pulmonary;;   CARDIAC CATHETERIZATION N/A 11/26/2014   Procedure: Left Heart Cath and Coronary Angiography;  Surgeon: Peter M Swaziland, MD;  Location: [facility] INVASIVE CV LAB;  Service: Cardiovascular;  Laterality: N/A;   COLONOSCOPY     per patient: done in [place], age 89, couple of polyps.   COLONOSCOPY WITH PROPOFOL  N/A 10/08/2022   Procedure: COLONOSCOPY WITH PROPOFOL ;  Surgeon: Cindie Carlin POUR, DO;  Location: [facility] ENDO SUITE;  Service: Endoscopy;  Laterality: N/A;  11:00 am   CORONARY STENT INTERVENTION N/A 01/31/2018   Procedure: CORONARY STENT INTERVENTION;  Surgeon: Court Dorn PARAS, MD;  Location: [facility] INVASIVE CV LAB;  Service: Cardiovascular;  Laterality: N/A;   HEMOSTASIS CONTROL  12/01/2022   Procedure: HEMOSTASIS CONTROL;  Surgeon: Kara Dorn NOVAK, MD;  Location: [facility] ENDOSCOPY;  Service: Pulmonary;;  cold saline   IR IMAGING GUIDED PORT INSERTION  12/23/2022   LEFT HEART CATH AND CORONARY ANGIOGRAPHY N/A 01/31/2018   Procedure: LEFT HEART CATH AND CORONARY ANGIOGRAPHY;  Surgeon: Court Dorn PARAS, MD;  Location: [facility] INVASIVE CV LAB;  Service: Cardiovascular;  Laterality: N/A;  NECK SURGERY  2005   VIDEO BRONCHOSCOPY N/A 12/01/2022   Procedure: VIDEO BRONCHOSCOPY WITH FLUORO;  Surgeon: Kara Dorn NOVAK, MD;  Location: [facility] ENDOSCOPY;  Service: Pulmonary;  Laterality: N/A;    Social History: Social History   Socioeconomic History   Marital status: Married    Spouse name: Not on file   Number of children: Not on file   Years of education: Not on file   Highest education level: Not on file  Occupational History   Not on file  Tobacco Use   Smoking status: Every Day    Current packs/day: 1.00    Average packs/day: 1 pack/day for 42.0 years (42.0 ttl pk-yrs)    Types: Cigarettes   Smokeless tobacco: Never   Tobacco comments:    He used to quit and start back. He has not quit for the past 5 years.   Vaping Use   Vaping  status: Never Used  Substance and Sexual Activity   Alcohol use: Yes    Comment: 10 cans of beer   Drug use: Yes    Types: Marijuana    Comment: smokes week here and there   Sexual activity: Yes    Birth control/protection: None  Other Topics Concern   Not on file  Social History Narrative   Works at [employer] and live with wife.    Social Drivers of Corporate investment banker Strain: High Risk (12/31/2022)   Overall Financial Resource Strain (CARDIA)    Difficulty of Paying Living Expenses: Very hard  Food Insecurity: No Food Insecurity (01/20/2023)   Hunger Vital Sign    Worried About Running Out of Food in the Last Year: Never true    Ran Out of Food in the Last Year: Never true  Transportation Needs: No Transportation Needs (01/20/2023)   PRAPARE - Administrator, Civil Service (Medical): No    Lack of Transportation (Non-Medical): No  Physical Activity: Not on file  Stress: Not on file  Social Connections: Not on file  Intimate Partner Violence: Not At Risk (01/20/2023)   Humiliation, Afraid, Rape, and Kick questionnaire    Fear of Current or Ex-Partner: No    Emotionally Abused: No    Physically Abused: No    Sexually Abused: No    Family History: Family History  Problem Relation Age of Onset   Diabetes Mother    Heart attack Father    Cancer Sister    Hypertension Brother    Colon cancer Neg Hx    Prostate cancer Neg Hx    Lung cancer Neg Hx     Current Medications:  Current Outpatient Medications:    acetaminophen  (TYLENOL ) 500 MG tablet, Take 500 mg by mouth every 6 (six) hours as needed for mild pain., Disp: , Rfl:    albuterol  (VENTOLIN  HFA) 108 (90 Base) MCG/ACT inhaler, Inhale 2 puffs into the lungs every 6 (six) hours as needed for wheezing or shortness of breath., Disp: 18 g, Rfl: 2   aluminum -magnesium  hydroxide 200-200 MG/5ML suspension, Take 10 mLs by mouth every 6 (six) hours as needed for indigestion., Disp: 480 mL, Rfl: 2    aspirin  EC 81 MG tablet, Take 1 tablet (81 mg total) by mouth daily. Swallow whole., Disp: 90 tablet, Rfl: 3   atorvastatin  (LIPITOR ) 80 MG tablet, Take 1 tablet (80 mg total) by mouth daily., Disp: 90 tablet, Rfl: 3   CARBOPLATIN  IV, Inject into the vein once a week., Disp: , Rfl:  empagliflozin  (JARDIANCE ) 10 MG TABS tablet, Take 1 tablet (10 mg total) by mouth daily before breakfast., Disp: 30 tablet, Rfl: 3   HYDROcodone -acetaminophen  (NORCO) 7.5-325 MG tablet, Take 1 tablet by mouth every 6 (six) hours as needed for moderate pain., Disp: 120 tablet, Rfl: 0   lidocaine -prilocaine  (EMLA ) cream, Apply to affected area once, Disp: 30 g, Rfl: 3   Menthol, Topical Analgesic, (BIOFREEZE EX), Apply 1 Application topically daily as needed (pain)., Disp: , Rfl:    metoprolol  succinate (TOPROL -XL) 25 MG 24 hr tablet, Take 1 tablet (25 mg total) by mouth daily., Disp: 90 tablet, Rfl: 3   nitroGLYCERIN  (NITROSTAT ) 0.4 MG SL tablet, Place 1 tablet (0.4 mg total) under the tongue every 5 (five) minutes as needed., Disp: 25 tablet, Rfl: 0   PACLITAXEL  IV, Inject into the vein once a week., Disp: , Rfl:    prochlorperazine  (COMPAZINE ) 10 MG tablet, Take 1 tablet (10 mg total) by mouth every 6 (six) hours as needed for nausea or vomiting., Disp: 60 tablet, Rfl: 3   sildenafil  (VIAGRA ) 50 MG tablet, TAKE 1 TABLET BY MOUTH ONCE DAILY AS NEEDED FOR ERECTILE DYSFUNCTION, Disp: 4 tablet, Rfl: 0   Allergies: No Known Allergies  REVIEW OF SYSTEMS:   Review of Systems  Constitutional:  Negative for chills, fatigue and fever.  HENT:   Negative for lump/mass, mouth sores, nosebleeds, sore throat and trouble swallowing.   Eyes:  Negative for eye problems.  Respiratory:  Positive for cough and shortness of breath.   Cardiovascular:  Negative for chest pain, leg swelling and palpitations.  Gastrointestinal:  Negative for abdominal pain, constipation, diarrhea, nausea and vomiting.  Genitourinary:  Negative for  bladder incontinence, difficulty urinating, dysuria, frequency, hematuria and nocturia.   Musculoskeletal:  Negative for arthralgias, back pain, flank pain, myalgias and neck pain.  Skin:  Negative for itching and rash.  Neurological:  Positive for dizziness. Negative for headaches and numbness.  Hematological:  Does not bruise/bleed easily.  Psychiatric/Behavioral:  Negative for depression, sleep disturbance and suicidal ideas. The patient is not nervous/anxious.   All other systems reviewed and are negative.    VITALS:   There were no vitals taken for this visit.  Wt Readings from Last 3 Encounters:  11/26/23 178 lb 3.2 oz (80.8 kg)  11/09/23 180 lb 5.4 oz (81.8 kg)  09/13/23 181 lb (82.1 kg)    There is no height or weight on file to calculate BMI.  Performance status (ECOG): 1 - Symptomatic but completely ambulatory  PHYSICAL EXAM:   Physical Exam Vitals and nursing note reviewed. Exam conducted with a chaperone present.  Constitutional:      Appearance: Normal appearance.   Cardiovascular:     Rate and Rhythm: Normal rate and regular rhythm.     Pulses: Normal pulses.     Heart sounds: Normal heart sounds.  Pulmonary:     Effort: Pulmonary effort is normal.     Breath sounds: Normal breath sounds.  Abdominal:     Palpations: Abdomen is soft. There is no hepatomegaly, splenomegaly or mass.     Tenderness: There is no abdominal tenderness.   Musculoskeletal:     Right lower leg: No edema.     Left lower leg: No edema.  Lymphadenopathy:     Cervical: No cervical adenopathy.     Right cervical: No superficial, deep or posterior cervical adenopathy.    Left cervical: No superficial, deep or posterior cervical adenopathy.     Upper Body:  Right upper body: No supraclavicular or axillary adenopathy.     Left upper body: No supraclavicular or axillary adenopathy.   Neurological:     General: No focal deficit present.     Mental Status: He is alert and oriented to  person, place, and time.   Psychiatric:        Mood and Affect: Mood normal.        Behavior: Behavior normal.     LABS:   CBC     Component Value Date/Time   WBC 7.0 11/09/2023 1146   RBC 4.55 11/09/2023 1146   HGB 14.6 11/09/2023 1146   HGB 13.1 10/28/2022 1501   HCT 42.4 11/09/2023 1146   HCT 40.1 10/28/2022 1501   PLT 208 11/09/2023 1146   PLT 363 10/28/2022 1501   MCV 93.2 11/09/2023 1146   MCV 90 10/28/2022 1501   MCH 32.1 11/09/2023 1146   MCHC 34.4 11/09/2023 1146   RDW 14.7 11/09/2023 1146   RDW 13.3 10/28/2022 1501   LYMPHSABS 2.2 11/09/2023 1146   LYMPHSABS 3.2 (H) 10/28/2022 1501   MONOABS 0.6 11/09/2023 1146   EOSABS 0.1 11/09/2023 1146   EOSABS 0.2 10/28/2022 1501   BASOSABS 0.0 11/09/2023 1146   BASOSABS 0.1 10/28/2022 1501    CMP      Component Value Date/Time   NA 137 11/09/2023 1146   NA 137 10/28/2022 1501   K 3.9 11/09/2023 1146   CL 101 11/09/2023 1146   CO2 25 11/09/2023 1146   GLUCOSE 101 (H) 11/09/2023 1146   BUN 8 11/09/2023 1146   BUN 10 10/28/2022 1501   CREATININE 0.68 11/09/2023 1146   CREATININE 0.78 07/04/2021 1606   CALCIUM  9.3 11/09/2023 1146   PROT 7.4 11/09/2023 1146   PROT 7.2 10/28/2022 1501   ALBUMIN 3.9 11/09/2023 1146   ALBUMIN 4.1 10/28/2022 1501   AST 30 11/09/2023 1146   ALT 35 11/09/2023 1146   ALKPHOS 76 11/09/2023 1146   BILITOT 0.6 11/09/2023 1146   BILITOT 0.3 10/28/2022 1501   GFRNONAA >60 11/09/2023 1146   GFRAA >60 02/01/2018 0043     No results found for: CEA1, CEA / No results found for: CEA1, CEA Lab Results  Component Value Date   PSA1 0.6 07/31/2021   No results found for: CAN199 No results found for: CAN125  No results found for: TOTALPROTELP, ALBUMINELP, A1GS, A2GS, BETS, BETA2SER, GAMS, MSPIKE, SPEI No results found for: TIBC, FERRITIN, IRONPCTSAT No results found for: LDH   STUDIES:   No results found.

## 2023-12-06 NOTE — Progress Notes (Signed)
 Patient has been examined by Dr. Ellin Saba. Vital signs and labs have been reviewed by MD - ANC, Creatinine, LFTs, hemoglobin, and platelets are within treatment parameters per M.D. - pt may proceed with treatment.  Primary RN and pharmacy notified.

## 2023-12-07 ENCOUNTER — Telehealth: Payer: Self-pay

## 2023-12-07 LAB — T4: T4, Total: 5.4 ug/dL (ref 4.5–12.0)

## 2023-12-07 NOTE — Telephone Encounter (Signed)
 Called pt let him know that ezetimibe  (ZETIA ) 10 MG tablet is for his high cholesterol and should not have been stopped, and to ask him if he would like to continue back on it

## 2023-12-07 NOTE — Telephone Encounter (Signed)
 Copied from CRM [id number]. Topic: Clinical - Medication Question >> Dec 06, 2023  3:18 PM Montie POUR wrote: Reason for CRM:  Bernadette called to get a refill on ezetimibe  (ZETIA ) 10 MG tablet and Dr. Melvenia discontinued medication on 11/26/23. Please call Lavere and let him know why he discontinued and what this medication is used for. Please call [phone]. Thanks

## 2023-12-07 NOTE — Telephone Encounter (Signed)
 Mail box is full and can not accept any message at this time.

## 2023-12-28 ENCOUNTER — Ambulatory Visit (HOSPITAL_COMMUNITY)
Admission: RE | Admit: 2023-12-28 | Discharge: 2023-12-28 | Disposition: A | Discharge status: Home / Self Care | Source: Ambulatory Visit | Attending: Hematology | Admitting: Hematology

## 2023-12-28 DIAGNOSIS — C3492 Malignant neoplasm of unspecified part of left bronchus or lung: Secondary | ICD-10-CM | POA: Diagnosis present

## 2023-12-28 MED ORDER — IOHEXOL 300 MG/ML  SOLN
75.0000 mL | Freq: Once | INTRAMUSCULAR | Status: AC | PRN
Start: 2023-12-28 — End: 2023-12-28
  Administered 2023-12-28: 75 mL via INTRAVENOUS

## 2024-01-03 ENCOUNTER — Inpatient Hospital Stay

## 2024-01-03 ENCOUNTER — Inpatient Hospital Stay (HOSPITAL_BASED_OUTPATIENT_CLINIC_OR_DEPARTMENT_OTHER): Attending: Hematology | Admitting: Hematology

## 2024-01-03 ENCOUNTER — Inpatient Hospital Stay: Attending: Hematology

## 2024-01-03 ENCOUNTER — Other Ambulatory Visit: Payer: Self-pay

## 2024-01-03 VITALS — BP 133/77 | Wt 175.9 lb

## 2024-01-03 VITALS — BP 127/65 | HR 85 | Temp 96.8°F

## 2024-01-03 DIAGNOSIS — F17210 Nicotine dependence, cigarettes, uncomplicated: Secondary | ICD-10-CM | POA: Diagnosis not present

## 2024-01-03 DIAGNOSIS — C3492 Malignant neoplasm of unspecified part of left bronchus or lung: Secondary | ICD-10-CM

## 2024-01-03 DIAGNOSIS — Z923 Personal history of irradiation: Secondary | ICD-10-CM | POA: Diagnosis not present

## 2024-01-03 DIAGNOSIS — C3412 Malignant neoplasm of upper lobe, left bronchus or lung: Secondary | ICD-10-CM | POA: Diagnosis not present

## 2024-01-03 DIAGNOSIS — Z79620 Long term (current) use of immunosuppressive biologic: Secondary | ICD-10-CM | POA: Diagnosis not present

## 2024-01-03 DIAGNOSIS — Z5112 Encounter for antineoplastic immunotherapy: Secondary | ICD-10-CM | POA: Insufficient documentation

## 2024-01-03 DIAGNOSIS — G620 Drug-induced polyneuropathy: Secondary | ICD-10-CM | POA: Diagnosis not present

## 2024-01-03 DIAGNOSIS — I252 Old myocardial infarction: Secondary | ICD-10-CM | POA: Insufficient documentation

## 2024-01-03 DIAGNOSIS — I10 Essential (primary) hypertension: Secondary | ICD-10-CM | POA: Insufficient documentation

## 2024-01-03 DIAGNOSIS — Z95828 Presence of other vascular implants and grafts: Secondary | ICD-10-CM

## 2024-01-03 LAB — CBC WITH DIFFERENTIAL/PLATELET
Abs Immature Granulocytes: 0.01 K/uL (ref 0.00–0.07)
Basophils Absolute: 0 K/uL (ref 0.0–0.1)
Basophils Relative: 1 %
Eosinophils Absolute: 0.1 K/uL (ref 0.0–0.5)
Eosinophils Relative: 2 %
HCT: 42.1 % (ref 39.0–52.0)
Hemoglobin: 14.2 g/dL (ref 13.0–17.0)
Immature Granulocytes: 0 %
Lymphocytes Relative: 29 %
Lymphs Abs: 1.4 K/uL (ref 0.7–4.0)
MCH: 32.3 pg (ref 26.0–34.0)
MCHC: 33.7 g/dL (ref 30.0–36.0)
MCV: 95.9 fL (ref 80.0–100.0)
Monocytes Absolute: 0.5 K/uL (ref 0.1–1.0)
Monocytes Relative: 11 %
Neutro Abs: 2.7 K/uL (ref 1.7–7.7)
Neutrophils Relative %: 57 %
Platelets: 219 K/uL (ref 150–400)
RBC: 4.39 MIL/uL (ref 4.22–5.81)
RDW: 15.5 % (ref 11.5–15.5)
WBC: 4.7 K/uL (ref 4.0–10.5)
nRBC: 0 % (ref 0.0–0.2)

## 2024-01-03 LAB — COMPREHENSIVE METABOLIC PANEL WITH GFR
ALT: 20 U/L (ref 0–44)
AST: 24 U/L (ref 15–41)
Albumin: 4.1 g/dL (ref 3.5–5.0)
Alkaline Phosphatase: 79 U/L (ref 38–126)
Anion gap: 15 (ref 5–15)
BUN: 9 mg/dL (ref 8–23)
CO2: 21 mmol/L — ABNORMAL LOW (ref 22–32)
Calcium: 9 mg/dL (ref 8.9–10.3)
Chloride: 99 mmol/L (ref 98–111)
Creatinine, Ser: 0.71 mg/dL (ref 0.61–1.24)
GFR, Estimated: >60 mL/min (ref >60)
Glucose, Bld: 87 mg/dL (ref 70–99)
Potassium: 3.4 mmol/L — ABNORMAL LOW (ref 3.5–5.1)
Sodium: 135 mmol/L (ref 135–145)
Total Bilirubin: 0.9 mg/dL (ref 0.0–1.2)
Total Protein: 7.4 g/dL (ref 6.5–8.1)

## 2024-01-03 LAB — CMP (CANCER CENTER ONLY)
ALT: 21 U/L (ref 0–44)
AST: 23 U/L (ref 15–41)
Albumin: 3.9 g/dL (ref 3.5–5.0)
Alkaline Phosphatase: 82 U/L (ref 38–126)
Anion gap: 11 (ref 5–15)
BUN: 9 mg/dL (ref 8–23)
CO2: 24 mmol/L (ref 22–32)
Calcium: 8.8 mg/dL — ABNORMAL LOW (ref 8.9–10.3)
Chloride: 100 mmol/L (ref 98–111)
Creatinine: 0.62 mg/dL (ref 0.61–1.24)
GFR, Estimated: >60 mL/min (ref >60)
Glucose, Bld: 92 mg/dL (ref 70–99)
Potassium: 3.4 mmol/L — ABNORMAL LOW (ref 3.5–5.1)
Sodium: 135 mmol/L (ref 135–145)
Total Bilirubin: 1 mg/dL (ref 0.0–1.2)
Total Protein: 7.4 g/dL (ref 6.5–8.1)

## 2024-01-03 LAB — TSH: TSH: 2.61 u[IU]/mL (ref 0.350–4.500)

## 2024-01-03 LAB — MAGNESIUM: Magnesium: 2 mg/dL (ref 1.7–2.4)

## 2024-01-03 MED ORDER — SODIUM CHLORIDE 0.9% FLUSH
10.0000 mL | Freq: Once | INTRAVENOUS | Status: AC
  Administered 2024-01-03: 10 mL via INTRAVENOUS

## 2024-01-03 MED ORDER — HEPARIN SOD (PORK) LOCK FLUSH 100 UNIT/ML IV SOLN
500.0000 [IU] | Freq: Once | INTRAVENOUS | Status: AC | PRN
Start: 2024-01-03 — End: 2024-01-03
  Administered 2024-01-03: 500 [IU]

## 2024-01-03 MED ORDER — SODIUM CHLORIDE 0.9% FLUSH
10.0000 mL | INTRAVENOUS | Status: DC | PRN
  Administered 2024-01-03: 10 mL

## 2024-01-03 MED ORDER — SODIUM CHLORIDE 0.9 % IV SOLN
1500.0000 mg | Freq: Once | INTRAVENOUS | Status: AC
  Administered 2024-01-03: 1500 mg via INTRAVENOUS
  Filled 2024-01-03: qty 30

## 2024-01-03 MED ORDER — SODIUM CHLORIDE 0.9 % IV SOLN: Freq: Once | INTRAVENOUS | Status: AC

## 2024-01-03 NOTE — Progress Notes (Signed)
 Patient presents today for Imfinzi  infusion. Patient is in satisfactory condition with no new complaints voiced.  Vital signs are stable. Patient's HR noted to be 102. Dr. Katragadda made aware and stated to proceed with treatment today. Labs reviewed by Dr. Rogers during the office visit and all labs are within treatment parameters.  We will proceed with treatment per MD orders.

## 2024-01-03 NOTE — Progress Notes (Signed)
 [facility] [address] [place] [address]    Clinic Day:  01/03/2024  Referring physician: Bevely Doffing, FNP  Patient Care Team: Bevely Doffing, FNP as PCP - General (Family Medicine) Debera Jayson MATSU, MD as PCP - Cardiology (Cardiology) Clemencia Pac, MD as Consulting Physician (Internal Medicine) Celestia Joesph SQUIBB, RN as Oncology Nurse Navigator (Medical Oncology) Rogers Hai, MD as Medical Oncologist (Medical Oncology)   ASSESSMENT & PLAN:   Assessment: 1.  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  No weight loss or hemoptysis. - CT angio chest (11/30/2022): Left upper lobe mass with central necrosis and bulky mediastinal, left hilar and left lower neck adenopathy and lytic lesion in the right superior sternum. - Bronchoscopy (12/01/2022): Narrowing of LUL apical and posterior segment.  Otherwise normal-appearing bronchial trees. - LUL upper lobe biopsy (12/01/2022): Invasive moderately differentiated squamous cell carcinoma. - PET scan (12/16/2022): Left supraclavicular 3.2 cm mass, direct extension from mediastinal adenopathy.  Small right supraclavicular lymph node.  Large left lung mass 7.4 x 6.1 cm extending into the left hilum and AP window.  Prevascular and paratracheal lymph nodes and right paratracheal node.  Extensive involvement of sternum manubrium and first rib close to manubrium.  Soft tissue extension into the medial border of the overlying pectoralis muscle.  Discrete hypermetabolic right upper lobe nodule 10 mm.  No abdominal or pelvic metastatic disease. - He has received 1 dose of concurrent chemo XRT on 12/23/2022. - Left lung XRT 12 Gray in 4 fractions and left bronchus radiation 18 Gray in 6 fractions completed - [employer] (01/07/2023): PIK3CA, T p53, MSI high not detected. - NGS (01/03/2023): TMB-high.  MSI-stable.  PIK3CA and T p53  mutations present.  No other targetable mutations.  PD-L1 TPS 0%.  HER2 IHC 0. - Carboplatin , paclitaxel , durvalumab  and tremelimumab  cycle 1 started on 01/13/2023, cycle 6 completed on 04/28/2023, maintenance Imfinzi  started on 05/20/2023   2.  Social/family history: - Lives at home by himself.  His wife helps him but they are separated.  He is independent of ADLs and IADLs.  Retired from work in April 2024.  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  Current active smoker, 1 pack/day started at age 57 and quit at the time of diagnosis. - Sister had cancer.  2 paternal cousins had cancer.    Plan: 1.  Stage IV (T4 N2 M1) squamous cell lung cancer: - He denies any immunotherapy related side effects.  He is tolerating monthly durvalumab  very well. - Reviewed labs from 01/03/2024: Normal LFTs and creatinine.  CBC was normal.  TSH is 2.6. - CT chest (12/28/2023): Shows some nodularity in the right lower lobe measuring 5 mm.  This is new.  Treated left upper lobe lung opacities are stable.  Stable prominent upper mediastinal left-sided lymph node.  I reviewed images with the patient. - These are very subtle changes and below the resolution of PET scan.  I have recommended continuing durvalumab  every 4 weeks.  RTC 12 weeks with repeat CT scans. - He reported right sided chest wall pressure on and off since one of his pills was stopped.  He is not sure which pill it is.  He had history of MI and stents.  Will make a referral to cardiology to establish care again.    2.  Peripheral neuropathy: - Numbness in the fingertips and toes is  constant and stable.  This is from prior taxane therapy.   3.  Hypertension: - Continue Toprol -XL daily.  Blood pressure is 133/77 today.      Orders Placed This Encounter  Procedures   CT CHEST ABDOMEN PELVIS W CONTRAST    Standing Status:   Future    Expected Date:   04/04/2024    Expiration Date:   01/02/2025    If indicated for the ordered procedure, I  authorize the administration of contrast media per Radiology protocol:   Yes    Does the patient have a contrast media/X-ray dye allergy?:   No    Preferred imaging location?:   [facility]    If indicated for the ordered procedure, I authorize the administration of oral contrast media per Radiology protocol:   Yes      I,Helena R Teague,acting as a scribe for Alean Stands, MD.,have documented all relevant documentation on the behalf of Alean Stands, MD,as directed by  Alean Stands, MD while in the presence of Alean Stands, MD.  I, Alean Stands MD, have reviewed the above documentation for accuracy and completeness, and I agree with the above.      Alean Stands, MD   7/21/20252:53 PM  CHIEF COMPLAINT:   Diagnosis: metastatic squamous cell lung cancer    Cancer Staging  Squamous cell lung cancer, left (HCC) Staging form: Lung, AJCC 8th Edition - Clinical stage from 12/22/2022: Stage IVA (cT4, cN3, cM1a) - Unsigned    Prior Therapy: 1. radiation therapy 12/15/22 - 12/29/22, with one dose carboplatin /taxol  on 12/25/22  2. carboplatin /paclitaxel , durvalumab , tremelimumab , 6 cycles, 01/13/23 - 04/29/23  Current Therapy:  maintenance durvalumab     HISTORY OF PRESENT ILLNESS:   Oncology History  Squamous cell lung cancer, left (HCC)  12/22/2022 Initial Diagnosis   Squamous cell lung cancer, left (HCC)   12/25/2022 - 12/25/2022 Chemotherapy   Patient is on Treatment Plan : ESOPHAGUS Carboplatin  + Paclitaxel  Weekly X 6 Weeks with XRT     01/13/2023 -  Chemotherapy   Patient is on Treatment Plan : LUNG NSCLC Squamous Tremelimumab -actl (cycles 1,2,3,4,6) + Durvalumab  + PAClitaxel  + Carboplatin  q21 days x 4 cycles / Durvalumab  q28 days        INTERVAL HISTORY:   Ebubechukwu is a 63 y.o. male seen for follow-up of metastatic squamous cell lung cancer. He was last seen by me on 12/06/2023.  Since his last visit, he underwent CT chest on  12/28/2023 that found: Stable presumed posttreatment changes in left upper lobe with nodular consolidative opacities and distortion with volume loss. Stable prominent upper mediastinal left-sided lymph nodes with some marginal calcification. However there are increasing reticulonodular changes in the right lung with several new tiny nodules. Fatty liver infiltration. Enlargement of the main pulmonary artery. Please correlate for any evidence of pulmonary hypertension. Aortic Atherosclerosis.   Today, he states that he is doing well overall. His appetite level is at 85%. His energy level is at 75%.   PAST MEDICAL HISTORY:   Past Medical History: Past Medical History:  Diagnosis Date   Bronchitis    CAD (coronary artery disease)    a. cath 11/26/2014 EF 45%, moderate to severe inferior hypokinesis, 90% small D1, 100% mid to distal RCA with minimal collateral, 40% mid to distal LCx. Medical therapy as RCA occlusion appears to be completed event; DES to mid circumflex October 2019   Essential hypertension    Hyperlipidemia    Ischemic cardiomyopathy    NSTEMI (non-ST elevated  myocardial infarction) (HCC)    11/26/14, 01/31/18   Squamous cell lung cancer, left [facility])     Surgical History: Past Surgical History:  Procedure Laterality Date   BRONCHIAL BIOPSY  12/01/2022   Procedure: BRONCHIAL BIOPSIES;  Surgeon: Kara Dorn NOVAK, MD;  Location: [facility] ENDOSCOPY;  Service: Pulmonary;;   BRONCHIAL BRUSHINGS  12/01/2022   Procedure: BRONCHIAL BRUSHINGS;  Surgeon: Kara Dorn NOVAK, MD;  Location: [facility] ENDOSCOPY;  Service: Pulmonary;;   BRONCHIAL WASHINGS  12/01/2022   Procedure: BRONCHIAL WASHINGS;  Surgeon: Kara Dorn NOVAK, MD;  Location: [facility] ENDOSCOPY;  Service: Pulmonary;;   CARDIAC CATHETERIZATION N/A 11/26/2014   Procedure: Left Heart Cath and Coronary Angiography;  Surgeon: Peter M Swaziland, MD;  Location: [facility] INVASIVE CV LAB;  Service: Cardiovascular;  Laterality: N/A;   COLONOSCOPY     per patient:  done in [place], age 31, couple of polyps.   COLONOSCOPY WITH PROPOFOL  N/A 10/08/2022   Procedure: COLONOSCOPY WITH PROPOFOL ;  Surgeon: Cindie Carlin POUR, DO;  Location: [facility] ENDO SUITE;  Service: Endoscopy;  Laterality: N/A;  11:00 am   CORONARY STENT INTERVENTION N/A 01/31/2018   Procedure: CORONARY STENT INTERVENTION;  Surgeon: Court Dorn PARAS, MD;  Location: [facility] INVASIVE CV LAB;  Service: Cardiovascular;  Laterality: N/A;   HEMOSTASIS CONTROL  12/01/2022   Procedure: HEMOSTASIS CONTROL;  Surgeon: Kara Dorn NOVAK, MD;  Location: [facility] ENDOSCOPY;  Service: Pulmonary;;  cold saline   IR IMAGING GUIDED PORT INSERTION  12/23/2022   LEFT HEART CATH AND CORONARY ANGIOGRAPHY N/A 01/31/2018   Procedure: LEFT HEART CATH AND CORONARY ANGIOGRAPHY;  Surgeon: Court Dorn PARAS, MD;  Location: [facility] INVASIVE CV LAB;  Service: Cardiovascular;  Laterality: N/A;   NECK SURGERY  2005   VIDEO BRONCHOSCOPY N/A 12/01/2022   Procedure: VIDEO BRONCHOSCOPY WITH FLUORO;  Surgeon: Kara Dorn NOVAK, MD;  Location: [facility] ENDOSCOPY;  Service: Pulmonary;  Laterality: N/A;    Social History: Social History   Socioeconomic History   Marital status: Married    Spouse name: Not on file   Number of children: Not on file   Years of education: Not on file   Highest education level: Not on file  Occupational History   Not on file  Tobacco Use   Smoking status: Every Day    Current packs/day: 1.00    Average packs/day: 1 pack/day for 42.0 years (42.0 ttl pk-yrs)    Types: Cigarettes   Smokeless tobacco: Never   Tobacco comments:    He used to quit and start back. He has not quit for the past 5 years.   Vaping Use   Vaping status: Never Used  Substance and Sexual Activity   Alcohol use: Yes    Comment: 10 cans of beer   Drug use: Yes    Types: Marijuana    Comment: smokes week here and there   Sexual activity: Yes    Birth control/protection: None  Other Topics Concern   Not on file  Social History Narrative   Works at  [employer] and live with wife.    Social Drivers of Corporate investment banker Strain: High Risk (12/31/2022)   Overall Financial Resource Strain (CARDIA)    Difficulty of Paying Living Expenses: Very hard  Food Insecurity: No Food Insecurity (01/20/2023)   Hunger Vital Sign    Worried About Running Out of Food in the Last Year: Never true    Ran Out of Food in the Last Year: Never true  Transportation Needs: No Transportation  Needs (01/20/2023)   PRAPARE - Administrator, Civil Service (Medical): No    Lack of Transportation (Non-Medical): No  Physical Activity: Not on file  Stress: Not on file  Social Connections: Not on file  Intimate Partner Violence: Not At Risk (01/20/2023)   Humiliation, Afraid, Rape, and Kick questionnaire    Fear of Current or Ex-Partner: No    Emotionally Abused: No    Physically Abused: No    Sexually Abused: No    Family History: Family History  Problem Relation Age of Onset   Diabetes Mother    Heart attack Father    Cancer Sister    Hypertension Brother    Colon cancer Neg Hx    Prostate cancer Neg Hx    Lung cancer Neg Hx     Current Medications:  Current Outpatient Medications:    acetaminophen  (TYLENOL ) 500 MG tablet, Take 500 mg by mouth every 6 (six) hours as needed for mild pain., Disp: , Rfl:    albuterol  (VENTOLIN  HFA) 108 (90 Base) MCG/ACT inhaler, Inhale 2 puffs into the lungs every 6 (six) hours as needed for wheezing or shortness of breath., Disp: 18 g, Rfl: 2   aluminum -magnesium  hydroxide 200-200 MG/5ML suspension, Take 10 mLs by mouth every 6 (six) hours as needed for indigestion., Disp: 480 mL, Rfl: 2   aspirin  EC 81 MG tablet, Take 1 tablet (81 mg total) by mouth daily. Swallow whole., Disp: 90 tablet, Rfl: 3   atorvastatin  (LIPITOR ) 80 MG tablet, Take 1 tablet (80 mg total) by mouth daily., Disp: 90 tablet, Rfl: 3   empagliflozin  (JARDIANCE ) 10 MG TABS tablet, Take 1 tablet (10 mg total) by mouth daily before  breakfast., Disp: 30 tablet, Rfl: 3   HYDROcodone -acetaminophen  (NORCO) 7.5-325 MG tablet, Take 1 tablet by mouth every 6 (six) hours as needed for moderate pain., Disp: 120 tablet, Rfl: 0   lidocaine -prilocaine  (EMLA ) cream, Apply to affected area once, Disp: 30 g, Rfl: 3   Menthol, Topical Analgesic, (BIOFREEZE EX), Apply 1 Application topically daily as needed (pain)., Disp: , Rfl:    metoprolol  succinate (TOPROL -XL) 25 MG 24 hr tablet, Take 1 tablet (25 mg total) by mouth daily., Disp: 90 tablet, Rfl: 3   nitroGLYCERIN  (NITROSTAT ) 0.4 MG SL tablet, Place 1 tablet (0.4 mg total) under the tongue every 5 (five) minutes as needed., Disp: 25 tablet, Rfl: 0   prochlorperazine  (COMPAZINE ) 10 MG tablet, Take 1 tablet (10 mg total) by mouth every 6 (six) hours as needed for nausea or vomiting., Disp: 60 tablet, Rfl: 3   sildenafil  (VIAGRA ) 50 MG tablet, TAKE 1 TABLET BY MOUTH ONCE DAILY AS NEEDED FOR ERECTILE DYSFUNCTION, Disp: 4 tablet, Rfl: 0 No current facility-administered medications for this visit.  Facility-Administered Medications Ordered in Other Visits:    durvalumab  (IMFINZI ) 1,500 mg in sodium chloride  0.9 % 100 mL chemo infusion, 1,500 mg, Intravenous, Once, Rogers Hai, MD   heparin  lock flush 100 unit/mL, 500 Units, Intracatheter, Once PRN, Fatemah Pourciau, MD   sodium chloride  flush (NS) 0.9 % injection 10 mL, 10 mL, Intracatheter, PRN, Abbagayle Zaragoza, MD   Allergies: No Known Allergies  REVIEW OF SYSTEMS:   Review of Systems  Constitutional:  Negative for chills, fatigue and fever.  HENT:   Negative for lump/mass, mouth sores, nosebleeds, sore throat and trouble swallowing.   Eyes:  Negative for eye problems.  Respiratory:  Positive for shortness of breath. Negative for cough.   Cardiovascular:  Positive for  chest pain. Negative for leg swelling and palpitations.  Gastrointestinal:  Negative for abdominal pain, constipation, diarrhea, nausea and vomiting.   Genitourinary:  Negative for bladder incontinence, difficulty urinating, dysuria, frequency, hematuria and nocturia.   Musculoskeletal:  Negative for arthralgias, back pain, flank pain, myalgias and neck pain.  Skin:  Negative for itching and rash.  Neurological:  Positive for numbness. Negative for dizziness and headaches.  Hematological:  Does not bruise/bleed easily.  Psychiatric/Behavioral:  Negative for depression, sleep disturbance and suicidal ideas. The patient is not nervous/anxious.   All other systems reviewed and are negative.    VITALS:   Blood pressure 133/77, weight 175 lb 14.8 oz (79.8 kg).  Wt Readings from Last 3 Encounters:  01/03/24 175 lb 14.8 oz (79.8 kg)  01/03/24 175 lb 11.3 oz (79.7 kg)  12/06/23 176 lb 8 oz (80.1 kg)    Body mass index is 28.4 kg/m.  Performance status (ECOG): 1 - Symptomatic but completely ambulatory  PHYSICAL EXAM:   Physical Exam Vitals and nursing note reviewed. Exam conducted with a chaperone present.  Constitutional:      Appearance: Normal appearance.  Cardiovascular:     Rate and Rhythm: Normal rate and regular rhythm.     Pulses: Normal pulses.     Heart sounds: Normal heart sounds.  Pulmonary:     Effort: Pulmonary effort is normal.     Breath sounds: Normal breath sounds.  Abdominal:     Palpations: Abdomen is soft. There is no hepatomegaly, splenomegaly or mass.     Tenderness: There is no abdominal tenderness.  Musculoskeletal:     Right lower leg: No edema.     Left lower leg: No edema.  Lymphadenopathy:     Cervical: No cervical adenopathy.     Right cervical: No superficial, deep or posterior cervical adenopathy.    Left cervical: No superficial, deep or posterior cervical adenopathy.     Upper Body:     Right upper body: No supraclavicular or axillary adenopathy.     Left upper body: No supraclavicular or axillary adenopathy.  Neurological:     General: No focal deficit present.     Mental Status: He is  alert and oriented to person, place, and time.  Psychiatric:        Mood and Affect: Mood normal.        Behavior: Behavior normal.     LABS:   CBC     Component Value Date/Time   WBC 4.7 01/03/2024 1335   RBC 4.39 01/03/2024 1335   HGB 14.2 01/03/2024 1335   HGB 13.1 10/28/2022 1501   HCT 42.1 01/03/2024 1335   HCT 40.1 10/28/2022 1501   PLT 219 01/03/2024 1335   PLT 363 10/28/2022 1501   MCV 95.9 01/03/2024 1335   MCV 90 10/28/2022 1501   MCH 32.3 01/03/2024 1335   MCHC 33.7 01/03/2024 1335   RDW 15.5 01/03/2024 1335   RDW 13.3 10/28/2022 1501   LYMPHSABS 1.4 01/03/2024 1335   LYMPHSABS 3.2 (H) 10/28/2022 1501   MONOABS 0.5 01/03/2024 1335   EOSABS 0.1 01/03/2024 1335   EOSABS 0.2 10/28/2022 1501   BASOSABS 0.0 01/03/2024 1335   BASOSABS 0.1 10/28/2022 1501    CMP      Component Value Date/Time   NA 135 01/03/2024 1300   NA 137 10/28/2022 1501   K 3.4 (L) 01/03/2024 1300   CL 100 01/03/2024 1300   CO2 24 01/03/2024 1300   GLUCOSE 92 01/03/2024 1300  BUN 9 01/03/2024 1300   BUN 10 10/28/2022 1501   CREATININE 0.62 01/03/2024 1300   CREATININE 0.78 07/04/2021 1606   CALCIUM  8.8 (L) 01/03/2024 1300   PROT 7.4 01/03/2024 1300   PROT 7.2 10/28/2022 1501   ALBUMIN 3.9 01/03/2024 1300   ALBUMIN 4.1 10/28/2022 1501   AST 23 01/03/2024 1300   ALT 21 01/03/2024 1300   ALKPHOS 82 01/03/2024 1300   BILITOT 1.0 01/03/2024 1300   GFRNONAA >60 01/03/2024 1300   GFRAA >60 02/01/2018 0043     No results found for: CEA1, CEA / No results found for: CEA1, CEA Lab Results  Component Value Date   PSA1 0.6 07/31/2021   No results found for: CAN199 No results found for: CAN125  No results found for: TOTALPROTELP, ALBUMINELP, A1GS, A2GS, BETS, BETA2SER, GAMS, MSPIKE, SPEI No results found for: TIBC, FERRITIN, IRONPCTSAT No results found for: LDH   STUDIES:   CT CHEST W CONTRAST Result Date: 12/30/2023 CLINICAL DATA:   Monitoring non-small-cell lung cancer. * Tracking Code: BO * EXAM: CT CHEST WITH CONTRAST TECHNIQUE: Multidetector CT imaging of the chest was performed during intravenous contrast administration. RADIATION DOSE REDUCTION: This exam was performed according to the departmental dose-optimization program which includes automated exposure control, adjustment of the mA and/or kV according to patient size and/or use of iterative reconstruction technique. CONTRAST:  75mL OMNIPAQUE  IOHEXOL  300 MG/ML  SOLN COMPARISON:  CT 10/04/2023 and older FINDINGS: Cardiovascular: Right IJ chest port in place. Tip extends to the central SVC. Heart is nonenlarged. Trace pericardial fluid, unchanged. The thoracic aorta is normal course and caliber. Scattered vascular calcifications. Bovine type aortic arch, normal variant. There is some enlargement of the pulmonary arteries. Please correlate for pulmonary artery hypertension. Mediastinum/Nodes: Mildly patulous esophagus. Thyroid  gland is preserved. No specific abnormal lymph node enlargement identified in the axillary region, right hilum. There are some small nodes in left hilum, less than a cm in short axis and not pathologic by size criteria. There is some partially calcified nodes along the left side of the upper mediastinum and towards the AP window region. These appears similar. Example previously measuring 11 mm short axis, today measures 10 mm on series 2, image 50. Small amount of tissue as well along the upper mediastinum along the course of the left subclavian artery and vein. Lungs/Pleura: Nodular infiltrative areas of opacity again seen left upper lobe in a similar distribution to previous and could be posttreatment related. Again there is some distortion. No left-sided new pleural effusion, consolidation pneumothorax. However in the right lung there are increasing reticular areas and increasing several scattered small areas of nodularity. Example includes superior segment  right lower lobe on series 3, image 55 measuring 5 mm. Focus right lung series 3, image 61 measures 5 mm. Numerous other scattered areas in the right hemithorax. No right-sided pleural effusion or pneumothorax. Motion. Upper Abdomen: Fatty liver infiltration. What is seen of the adrenal glands are preserved. These are incompletely included in the imaging field. Musculoskeletal: Fixation hardware along the lower cervical spine. Curvature and degenerative changes seen along the spine. Stable heterogeneity of the manubrium of the sternum. IMPRESSION: Stable presumed posttreatment changes in left upper lobe with nodular consolidative opacities and distortion with volume loss. Stable prominent upper mediastinal left-sided lymph nodes with some marginal calcification. However there are increasing reticulonodular changes in the right lung with several new tiny nodules. This would have a broad differential. Please correlate clinical presentation. Recommend short follow-up CT scan 3 months.  If there is more clinical concern additional PET-CT evaluation could be considered. Fatty liver infiltration. Enlargement of the main pulmonary artery. Please correlate for any evidence of pulmonary hypertension. Aortic Atherosclerosis (ICD10-I70.0). Electronically Signed   By: Ranell Bring M.D.   On: 12/30/2023 11:13

## 2024-01-03 NOTE — Patient Instructions (Addendum)
 [facility] at [facility] Discharge Instructions   You were seen and examined today by Dr. Cheree Cords.  He reviewed the results of your lab work which are normal/stable.   He reviewed the results of your CT scan which did not show any evidence of cancer.   We will proceed with your treatment today.   Return as scheduled.    Thank you for choosing [facility] at [facility] to provide your oncology and hematology care.  To afford each patient quality time with our provider, please arrive at least 15 minutes before your scheduled appointment time.   If you have a lab appointment with the Cancer Center please come in thru the Main Entrance and check in at the main information desk.  You need to re-schedule your appointment should you arrive 10 or more minutes late.  We strive to give you quality time with our providers, and arriving late affects you and other patients whose appointments are after yours.  Also, if you no show three or more times for appointments you may be dismissed from the clinic at the providers discretion.     Again, thank you for choosing [facility].  Our hope is that these requests will decrease the amount of time that you wait before being seen by our physicians.       _____________________________________________________________  Should you have questions after your visit to [facility], please contact our office at [phone] and follow the prompts.  Our office hours are 8:00 a.m. and 4:30 p.m. Monday - Friday.  Please note that voicemails left after 4:00 p.m. may not be returned until the following business day.  We are closed weekends and major holidays.  You do have access to a nurse 24-7, just call the main number to the clinic [phone] and do not press any options, hold on the line and a nurse will answer the phone.    For prescription refill requests, have your pharmacy contact our  office and allow 72 hours.    Due to Covid, you will need to wear a mask upon entering the hospital. If you do not have a mask, a mask will be given to you at the Main Entrance upon arrival. For doctor visits, patients may have 1 support person age 23 or older with them. For treatment visits, patients can not have anyone with them due to social distancing guidelines and our immunocompromised population.

## 2024-01-03 NOTE — Patient Instructions (Signed)
 [facility] - A DEPT OF [facility]  Discharge Instructions: Thank you for choosing [facility] to provide your oncology and hematology care.  If you have a lab appointment with the Cancer Center - please note that after April 8th, 2024, all labs will be drawn in the cancer center.  You do not have to check in or register with the main entrance as you have in the past but will complete your check-in in the cancer center.  Wear comfortable clothing and clothing appropriate for easy access to any Portacath or PICC line.   We strive to give you quality time with your provider. You may need to reschedule your appointment if you arrive late (15 or more minutes).  Arriving late affects you and other patients whose appointments are after yours.  Also, if you miss three or more appointments without notifying the office, you may be dismissed from the clinic at the provider's discretion.      For prescription refill requests, have your pharmacy contact our office and allow 72 hours for refills to be completed.    Today you received the following chemotherapy and/or immunotherapy agents Imfinzi.  Durvalumab Injection What is this medication? DURVALUMAB (dur VAL ue mab) treats some types of cancer. It works by helping your immune system slow or stop the spread of cancer cells. It is a monoclonal antibody. This medicine may be used for other purposes; ask your health care provider or pharmacist if you have questions. COMMON BRAND NAME(S): IMFINZI What should I tell my care team before I take this medication? They need to know if you have any of these conditions: Allogeneic stem cell transplant (uses someone else's stem cells) Autoimmune diseases, such as Crohn disease, ulcerative colitis, lupus History of chest radiation Nervous system problems, such as Guillain-Barre syndrome, myasthenia gravis Organ transplant An unusual or allergic reaction to durvalumab,  other medications, foods, dyes, or preservatives Pregnant or trying to get pregnant Breast-feeding How should I use this medication? This medication is infused into a vein. It is given by your care team in a hospital or clinic setting. A special MedGuide will be given to you before each treatment. Be sure to read this information carefully each time. Talk to your care team about the use of this medication in children. Special care may be needed. Overdosage: If you think you have taken too much of this medicine contact a poison control center or emergency room at once. NOTE: This medicine is only for you. Do not share this medicine with others. What if I miss a dose? Keep appointments for follow-up doses. It is important not to miss your dose. Call your care team if you are unable to keep an appointment. What may interact with this medication? Interactions have not been studied. This list may not describe all possible interactions. Give your health care provider a list of all the medicines, herbs, non-prescription drugs, or dietary supplements you use. Also tell them if you smoke, drink alcohol, or use illegal drugs. Some items may interact with your medicine. What should I watch for while using this medication? Your condition will be monitored carefully while you are receiving this medication. You may need blood work while taking this medication. This medication may cause serious skin reactions. They can happen weeks to months after starting the medication. Contact your care team right away if you notice fevers or flu-like symptoms with a rash. The rash may be red or  purple and then turn into blisters or peeling of the skin. You may also notice a red rash with swelling of the face, lips, or lymph nodes in your neck or under your arms. Tell your care team right away if you have any change in your eyesight. Talk to your care team if you may be pregnant. Serious birth defects can occur if you take  this medication during pregnancy and for 3 months after the last dose. You will need a negative pregnancy test before starting this medication. Contraception is recommended while taking this medication and for 3 months after the last dose. Your care team can help you find the option that works for you. Do not breastfeed while taking this medication and for 3 months after the last dose. What side effects may I notice from receiving this medication? Side effects that you should report to your care team as soon as possible: Allergic reactions--skin rash, itching, hives, swelling of the face, lips, tongue, or throat Dry cough, shortness of breath or trouble breathing Eye pain, redness, irritation, or discharge with blurry or decreased vision Heart muscle inflammation--unusual weakness or fatigue, shortness of breath, chest pain, fast or irregular heartbeat, dizziness, swelling of the ankles, feet, or hands Hormone gland problems--headache, sensitivity to light, unusual weakness or fatigue, dizziness, fast or irregular heartbeat, increased sensitivity to cold or heat, excessive sweating, constipation, hair loss, increased thirst or amount of urine, tremors or shaking, irritability Infusion reactions--chest pain, shortness of breath or trouble breathing, feeling faint or lightheaded Kidney injury (glomerulonephritis)--decrease in the amount of urine, red or dark brown urine, foamy or bubbly urine, swelling of the ankles, hands, or feet Liver injury--right upper belly pain, loss of appetite, nausea, light-colored stool, dark yellow or brown urine, yellowing skin or eyes, unusual weakness or fatigue Pain, tingling, or numbness in the hands or feet, muscle weakness, change in vision, confusion or trouble speaking, loss of balance or coordination, trouble walking, seizures Rash, fever, and swollen lymph nodes Redness, blistering, peeling, or loosening of the skin, including inside the mouth Sudden or severe  stomach pain, bloody diarrhea, fever, nausea, vomiting Side effects that usually do not require medical attention (report these to your care team if they continue or are bothersome): Bone, joint, or muscle pain Diarrhea Fatigue Loss of appetite Nausea Skin rash This list may not describe all possible side effects. Call your doctor for medical advice about side effects. You may report side effects to FDA at 1-800-FDA-1088. Where should I keep my medication? This medication is given in a hospital or clinic. It will not be stored at home. NOTE: This sheet is a summary. It may not cover all possible information. If you have questions about this medicine, talk to your doctor, pharmacist, or health care provider.  2024 Elsevier/Gold Standard (2021-10-14 00:00:00)        To help prevent nausea and vomiting after your treatment, we encourage you to take your nausea medication as directed.  BELOW ARE SYMPTOMS THAT SHOULD BE REPORTED IMMEDIATELY: *FEVER GREATER THAN 100.4 F (38 C) OR HIGHER *CHILLS OR SWEATING *NAUSEA AND VOMITING THAT IS NOT CONTROLLED WITH YOUR NAUSEA MEDICATION *UNUSUAL SHORTNESS OF BREATH *UNUSUAL BRUISING OR BLEEDING *URINARY PROBLEMS (pain or burning when urinating, or frequent urination) *BOWEL PROBLEMS (unusual diarrhea, constipation, pain near the anus) TENDERNESS IN MOUTH AND THROAT WITH OR WITHOUT PRESENCE OF ULCERS (sore throat, sores in mouth, or a toothache) UNUSUAL RASH, SWELLING OR PAIN  UNUSUAL VAGINAL DISCHARGE OR ITCHING   Items  with * indicate a potential emergency and should be followed up as soon as possible or go to the Emergency Department if any problems should occur.  Please show the CHEMOTHERAPY ALERT CARD or IMMUNOTHERAPY ALERT CARD at check-in to the Emergency Department and triage nurse.  Should you have questions after your visit or need to cancel or reschedule your appointment, please contact [facility] [place] - A DEPT OF [facility] [phone]  and follow the prompts.  Office hours are 8:00 a.m. to 4:30 p.m. Monday - Friday. Please note that voicemails left after 4:00 p.m. may not be returned until the following business day.  We are closed weekends and major holidays. You have access to a nurse at all times for urgent questions. Please call the main number to the clinic [phone] and follow the prompts.  For any non-urgent questions, you may also contact your provider using MyChart. We now offer e-Visits for anyone 68 and older to request care online for non-urgent symptoms. For details visit [email or web address].   Also download the MyChart app! Go to the app store, search "MyChart", open the app, select [place], and log in with your MyChart username and password.

## 2024-01-03 NOTE — Progress Notes (Signed)
 Patient tolerated chemotherapy with no complaints voiced.  Side effects with management reviewed with understanding verbalized.  Port site clean and dry with no bruising or swelling noted at site.  Good blood return noted before and after administration of chemotherapy.  Band aid applied.  Patient left in satisfactory condition with VSS and no s/s of distress noted. All follow ups as scheduled.   Rodney Mahoney [employer]

## 2024-01-03 NOTE — Progress Notes (Signed)
 Patient has been examined by Dr. Ellin Saba. Vital signs and labs have been reviewed by MD - ANC, Creatinine, LFTs, hemoglobin, and platelets are within treatment parameters per M.D. - pt may proceed with treatment.  Primary RN and pharmacy notified.

## 2024-01-04 ENCOUNTER — Other Ambulatory Visit: Payer: Self-pay

## 2024-01-04 ENCOUNTER — Telehealth: Payer: Self-pay | Admitting: Cardiology

## 2024-01-04 ENCOUNTER — Emergency Department (HOSPITAL_COMMUNITY)

## 2024-01-04 ENCOUNTER — Emergency Department (HOSPITAL_COMMUNITY)
Admission: EM | Admit: 2024-01-04 | Discharge: 2024-01-04 | Disposition: A | Discharge status: Home / Self Care | Source: Ambulatory Visit | Attending: Emergency Medicine | Admitting: Emergency Medicine

## 2024-01-04 ENCOUNTER — Encounter (HOSPITAL_COMMUNITY): Payer: Self-pay | Admitting: *Deleted

## 2024-01-04 DIAGNOSIS — M19011 Primary osteoarthritis, right shoulder: Secondary | ICD-10-CM | POA: Diagnosis not present

## 2024-01-04 DIAGNOSIS — F17200 Nicotine dependence, unspecified, uncomplicated: Secondary | ICD-10-CM | POA: Diagnosis not present

## 2024-01-04 DIAGNOSIS — E876 Hypokalemia: Secondary | ICD-10-CM | POA: Diagnosis not present

## 2024-01-04 DIAGNOSIS — Z79899 Other long term (current) drug therapy: Secondary | ICD-10-CM | POA: Insufficient documentation

## 2024-01-04 DIAGNOSIS — Z7982 Long term (current) use of aspirin: Secondary | ICD-10-CM | POA: Diagnosis not present

## 2024-01-04 DIAGNOSIS — I10 Essential (primary) hypertension: Secondary | ICD-10-CM | POA: Diagnosis not present

## 2024-01-04 DIAGNOSIS — M25511 Pain in right shoulder: Secondary | ICD-10-CM | POA: Diagnosis present

## 2024-01-04 DIAGNOSIS — R0789 Other chest pain: Secondary | ICD-10-CM | POA: Insufficient documentation

## 2024-01-04 LAB — BASIC METABOLIC PANEL WITH GFR
Anion gap: 12 (ref 5–15)
BUN: 9 mg/dL (ref 8–23)
CO2: 23 mmol/L (ref 22–32)
Calcium: 8.8 mg/dL — ABNORMAL LOW (ref 8.9–10.3)
Chloride: 102 mmol/L (ref 98–111)
Creatinine, Ser: 0.79 mg/dL (ref 0.61–1.24)
GFR, Estimated: >60 mL/min (ref >60)
Glucose, Bld: 111 mg/dL — ABNORMAL HIGH (ref 70–99)
Potassium: 3.2 mmol/L — ABNORMAL LOW (ref 3.5–5.1)
Sodium: 137 mmol/L (ref 135–145)

## 2024-01-04 LAB — TROPONIN I (HIGH SENSITIVITY)
Troponin I (High Sensitivity): 8 ng/L (ref <18)
Troponin I (High Sensitivity): 8 ng/L (ref <18)

## 2024-01-04 LAB — CBC
HCT: 39 % (ref 39.0–52.0)
Hemoglobin: 13.1 g/dL (ref 13.0–17.0)
MCH: 32.3 pg (ref 26.0–34.0)
MCHC: 33.6 g/dL (ref 30.0–36.0)
MCV: 96.1 fL (ref 80.0–100.0)
Platelets: 204 K/uL (ref 150–400)
RBC: 4.06 MIL/uL — ABNORMAL LOW (ref 4.22–5.81)
RDW: 15.5 % (ref 11.5–15.5)
WBC: 4.9 K/uL (ref 4.0–10.5)
nRBC: 0 % (ref 0.0–0.2)

## 2024-01-04 LAB — T4: T4, Total: 4.9 ug/dL (ref 4.5–12.0)

## 2024-01-04 MED ORDER — METHOCARBAMOL 750 MG PO TABS: 750.0000 mg | ORAL_TABLET | Freq: Two times a day (BID) | ORAL | 0 refills | Status: DC

## 2024-01-04 MED ORDER — POTASSIUM CHLORIDE CRYS ER 20 MEQ PO TBCR
40.0000 meq | EXTENDED_RELEASE_TABLET | Freq: Once | ORAL | Status: AC
  Administered 2024-01-04: 40 meq via ORAL
  Filled 2024-01-04: qty 2

## 2024-01-04 MED ORDER — METHOCARBAMOL 500 MG PO TABS
750.0000 mg | ORAL_TABLET | Freq: Once | ORAL | Status: AC
  Administered 2024-01-04: 750 mg via ORAL
  Filled 2024-01-04: qty 2

## 2024-01-04 MED ORDER — ASPIRIN 81 MG PO CHEW
243.0000 mg | CHEWABLE_TABLET | Freq: Once | ORAL | Status: AC
  Administered 2024-01-04: 243 mg via ORAL
  Filled 2024-01-04: qty 3

## 2024-01-04 MED ORDER — NITROGLYCERIN 0.4 MG SL SUBL
0.4000 mg | SUBLINGUAL_TABLET | SUBLINGUAL | Status: DC | PRN
  Administered 2024-01-04: 0.4 mg via SUBLINGUAL
  Filled 2024-01-04: qty 1

## 2024-01-04 MED ORDER — ACETAMINOPHEN 325 MG PO TABS
650.0000 mg | ORAL_TABLET | Freq: Once | ORAL | Status: AC
  Administered 2024-01-04: 650 mg via ORAL
  Filled 2024-01-04: qty 2

## 2024-01-04 NOTE — Telephone Encounter (Signed)
 Pt came into office complaining of chest tightness. Nurses notified

## 2024-01-04 NOTE — ED Provider Notes (Signed)
 [place] EMERGENCY DEPARTMENT AT [facility] Provider Note   CSN: [id number] Arrival date & time: 01/04/24  1135     Patient presents with: Chest Pain   Rodney Mahoney is a 63 y.o. male with a history significant for non-STEMI with stent in 2019, hypertension, hyperlipidemia who is currently under the care of Dr. Rogers for squamous cell lung cancer with last chemotherapy treatment yesterday.  At that time he had complaint of right sided chest tightness in association with right neck and shoulder pain.  He is not sure whether the symptoms are related, the chest tightness is constant and is somewhat reproducible with movement however the neck and shoulder pain is worsened with certain positions.  Given his history he was advised cardiology follow-up, he tried to see them this morning and was promptly referred to the ED for workup.  He denies any increased shortness of breath beyond his baseline.  Patient continues to smoke.  He also denies nausea or vomiting, palpitations, denies peripheral edema or pain, diaphoresis, n/v palpitations.   The history is provided by the patient, the spouse and medical records.       Prior to Admission medications   Medication Sig Start Date End Date Taking? Authorizing Provider  acetaminophen  (TYLENOL ) 500 MG tablet Take 500 mg by mouth every 6 (six) hours as needed for mild pain.   Yes [provider]  aspirin  EC 81 MG tablet Take 1 tablet (81 mg total) by mouth daily. Swallow whole. 06/27/21  Yes Furth, Cadence H, PA-C  atorvastatin  (LIPITOR ) 80 MG tablet Take 1 tablet (80 mg total) by mouth daily. 11/26/23  Yes Melvenia Manus BRAVO, MD  lidocaine -prilocaine  (EMLA ) cream Apply to affected area once 01/13/23  Yes Katragadda, Sreedhar, MD  methocarbamol  (ROBAXIN ) 750 MG tablet Take 1 tablet (750 mg total) by mouth 2 (two) times daily. 01/04/24  Yes Verdis Koval, PA-C  metoprolol  succinate (TOPROL -XL) 25 MG 24 hr tablet Take 1 tablet (25 mg  total) by mouth daily. 11/26/23  Yes Melvenia Manus BRAVO, MD  nitroGLYCERIN  (NITROSTAT ) 0.4 MG SL tablet Place 1 tablet (0.4 mg total) under the tongue every 5 (five) minutes as needed. Patient taking differently: Place 0.4 mg under the tongue every 5 (five) minutes as needed for chest pain. 02/02/18  Yes Henry Shaver B, NP  sildenafil  (VIAGRA ) 50 MG tablet TAKE 1 TABLET BY MOUTH ONCE DAILY AS NEEDED FOR ERECTILE DYSFUNCTION 10/07/23  Yes Melvenia Manus BRAVO, MD    Allergies: Patient has no known allergies.    Review of Systems  Constitutional:  Negative for fever.  HENT:  Negative for congestion and sore throat.   Eyes: Negative.   Respiratory:  Positive for chest tightness. Negative for shortness of breath.   Cardiovascular:  Negative for chest pain.  Gastrointestinal:  Negative for abdominal pain and nausea.  Genitourinary: Negative.   Musculoskeletal:  Positive for arthralgias. Negative for joint swelling and neck pain.  Skin: Negative.  Negative for rash and wound.  Neurological:  Negative for dizziness, weakness, light-headedness, numbness and headaches.  Psychiatric/Behavioral: Negative.      Updated Vital Signs BP 115/77   Pulse 80   Temp 97.7 F (36.5 C) (Oral)   Resp (!) 23   Ht 5' 6 (1.676 m)   Wt 79.4 kg   SpO2 95%   BMI 28.25 kg/m   Physical Exam Vitals and nursing note reviewed.  Constitutional:      Appearance: He is well-developed.  HENT:  Head: Normocephalic and atraumatic.  Eyes:     Conjunctiva/sclera: Conjunctivae normal.  Cardiovascular:     Rate and Rhythm: Normal rate and regular rhythm.     Heart sounds: Normal heart sounds.  Pulmonary:     Effort: Pulmonary effort is normal.     Breath sounds: Normal breath sounds. No wheezing or rhonchi.  Chest:     Chest wall: Tenderness present.     Comments: Ttp right upper chest inferior to and including right clavicle.  Also tender right trapezius across right upper shoulder. Abdominal:     General:  Bowel sounds are normal.     Palpations: Abdomen is soft.     Tenderness: There is no abdominal tenderness.  Musculoskeletal:        General: Normal range of motion.     Cervical back: Normal range of motion.  Skin:    General: Skin is warm and dry.  Neurological:     Mental Status: He is alert.     (all labs ordered are listed, but only abnormal results are displayed) Labs Reviewed  BASIC METABOLIC PANEL WITH GFR - Abnormal; Notable for the following components:      Result Value   Potassium 3.2 (*)    Glucose, Bld 111 (*)    Calcium  8.8 (*)    All other components within normal limits  CBC - Abnormal; Notable for the following components:   RBC 4.06 (*)    All other components within normal limits  TROPONIN I (HIGH SENSITIVITY)  TROPONIN I (HIGH SENSITIVITY)    EKG: None  Radiology: DG Cervical Spine Complete Result Date: 01/04/2024 CLINICAL DATA:  855384 Pain [id number] i EXAM: CERVICAL SPINE - COMPLETE 4+ VIEW COMPARISON:  CT C-spine 11/30/2022 FINDINGS: C5-C7 anterior cervical discectomy and fusion surgical hardware. There is no evidence of cervical spine fracture or prevertebral soft tissue swelling. Bulky anterior osteophyte formation at the C4-C6 levels. No severe osseous neural foraminal stenosis. Alignment is normal. No other significant bone abnormalities are identified. IMPRESSION: Negative cervical spine radiographs for acute findings. Electronically Signed   By: Morgane  Naveau M.D.   On: 01/04/2024 14:09   DG Shoulder Right Result Date: 01/04/2024 CLINICAL DATA:  pain, no history of injury EXAM: RIGHT SHOULDER - 2+ VIEW COMPARISON:  None Available. FINDINGS: Right chest wall Port-A-Cath partially visualized. There is no evidence of fracture or dislocation. Moderate to severe acromioclavicular joint degenerative changes. Soft tissues are unremarkable. Cervical surgical hardware. IMPRESSION: No acute displaced fracture or dislocation. Electronically Signed   By: Morgane   Naveau M.D.   On: 01/04/2024 14:08   DG Chest Portable 1 View Result Date: 01/04/2024 CLINICAL DATA:  Chest pain, dyspnea, lung cancer EXAM: PORTABLE CHEST 1 VIEW COMPARISON:  12/03/2022, CT 12/28/2023 FINDINGS: Left-sided volume loss and left apical parenchymal opacity corresponding to post radiation changes within the left upper lobe are again identified. Right lung is clear. No pneumothorax or pleural effusion. Right internal jugular chest port with its tip within the superior vena cava noted. Cardiac size within normal limits. Pulmonary vascularity is normal. IMPRESSION: 1. No radiographic evidence of acute cardiopulmonary disease. 2. Post radiation changes within the left upper lobe. Electronically Signed   By: Dorethia Molt M.D.   On: 01/04/2024 12:02     Procedures   Medications Ordered in the ED  nitroGLYCERIN  (NITROSTAT ) SL tablet 0.4 mg (0.4 mg Sublingual Given 01/04/24 1308)  aspirin  chewable tablet 243 mg (243 mg Oral Given 01/04/24 1304)  potassium chloride  SA (KLOR-CON   M) CR tablet 40 mEq (40 mEq Oral Given 01/04/24 1335)  acetaminophen  (TYLENOL ) tablet 650 mg (650 mg Oral Given 01/04/24 1545)  methocarbamol  (ROBAXIN ) tablet 750 mg (750 mg Oral Given 01/04/24 1544)                                    Medical Decision Making Patient who is actively receiving chemotherapy for lung cancer presenting with a 2-week history of right sided shoulder and upper right chest pain, baseline shortness of breath which she denies being worsened with this new complaint of pain.  He denies injuries or falls.  Pain is somewhat reproducible, stating there are certain movements of his right arm which causes severe pain, but endorses the pressure in his upper chest remains fairly constant.  He states his pain is not similar to his prior MI episode.  His symptoms are reproducible on exam today and are reassuring, most suspect this is a musculoskeletal source of pain, especially given negative cardiac workup  here.  He was encouraged heat therapy, Robaxin  was prescribed, we also discussed arthritis strength Tylenol  and he may benefit from referral to orthopedics, perhaps steroid injections of the [facility] joint revealed on today's x-rays would help with his pain.  Referral was given.  Amount and/or Complexity of Data Reviewed Labs: ordered.    Details: Labs are reassuring with a negative delta troponin, CBC and B met are unremarkable.  He did have a mild hypokalemia with potassium of 3.2 and this was replaced with oral potassium. Radiology: ordered.    Details: Chest x-ray along with x-ray of C-spine and right shoulder revealing for moderate to severe AC degenerative changes right, chest x-ray is negative for acute cardiopulmonary disease. ECG/medicine tests: ordered.    Details: Rate 89, LVH with anterior ST elevation, unchanged from prior EKG.  He does have a new prolonged QT at 515.  Risk OTC drugs. Prescription drug management.        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

## 2024-01-04 NOTE — ED Triage Notes (Signed)
 Pt c/o CP and SOB x 2 weeks, wife states pt refused to come earlier, pt states he has lung CA as well. Pt seen [facility] center yesterday and [facility] to go [facility], who sent pt here today. Last chemo was yesterday per pt.

## 2024-01-04 NOTE — Discharge Instructions (Signed)
 You may try the Robaxin  as prescribed which may help with any muscle spasm which is causing symptoms.  I also recommend taking arthritis strength Tylenol  according to label instructions, no prescription needed for this medication.  You may also try heating pad applied to area of pain for 20 minutes 3-4 times daily.

## 2024-01-04 NOTE — Telephone Encounter (Signed)
 Pt c/o chest tightness and shoulder pain on the right side. Pt states that it started 2 weeks ago. Pt has chemo port on the right side. Pt c/o SOB most of the time. C/o nausea and did vomit once last week. Pt rates pressure 5/10. Has been taking nitroglycerin  1-2 times daily. Pt reports that it does help. Pt advised to be seen in the ER for evaluation. Pt agrees with plan on care. Please advise.

## 2024-01-31 ENCOUNTER — Inpatient Hospital Stay: Admitting: Oncology

## 2024-01-31 ENCOUNTER — Inpatient Hospital Stay: Admitting: Hematology

## 2024-01-31 ENCOUNTER — Inpatient Hospital Stay

## 2024-01-31 ENCOUNTER — Inpatient Hospital Stay: Attending: Hematology

## 2024-01-31 VITALS — BP 124/83 | HR 93 | Resp 19

## 2024-01-31 DIAGNOSIS — Z79620 Long term (current) use of immunosuppressive biologic: Secondary | ICD-10-CM | POA: Diagnosis not present

## 2024-01-31 DIAGNOSIS — F17210 Nicotine dependence, cigarettes, uncomplicated: Secondary | ICD-10-CM | POA: Diagnosis not present

## 2024-01-31 DIAGNOSIS — C3492 Malignant neoplasm of unspecified part of left bronchus or lung: Secondary | ICD-10-CM

## 2024-01-31 DIAGNOSIS — C3412 Malignant neoplasm of upper lobe, left bronchus or lung: Secondary | ICD-10-CM | POA: Insufficient documentation

## 2024-01-31 DIAGNOSIS — Z5112 Encounter for antineoplastic immunotherapy: Secondary | ICD-10-CM | POA: Insufficient documentation

## 2024-01-31 LAB — CBC WITH DIFFERENTIAL/PLATELET
Abs Immature Granulocytes: 0.01 K/uL (ref 0.00–0.07)
Basophils Absolute: 0 K/uL (ref 0.0–0.1)
Basophils Relative: 1 %
Eosinophils Absolute: 0.1 K/uL (ref 0.0–0.5)
Eosinophils Relative: 2 %
HCT: 42.7 % (ref 39.0–52.0)
Hemoglobin: 14.3 g/dL (ref 13.0–17.0)
Immature Granulocytes: 0 %
Lymphocytes Relative: 27 %
Lymphs Abs: 1.7 K/uL (ref 0.7–4.0)
MCH: 31.6 pg (ref 26.0–34.0)
MCHC: 33.5 g/dL (ref 30.0–36.0)
MCV: 94.5 fL (ref 80.0–100.0)
Monocytes Absolute: 0.6 K/uL (ref 0.1–1.0)
Monocytes Relative: 10 %
Neutro Abs: 3.6 K/uL (ref 1.7–7.7)
Neutrophils Relative %: 60 %
Platelets: 207 K/uL (ref 150–400)
RBC: 4.52 MIL/uL (ref 4.22–5.81)
RDW: 15.5 % (ref 11.5–15.5)
WBC: 6.1 K/uL (ref 4.0–10.5)
nRBC: 0 % (ref 0.0–0.2)

## 2024-01-31 LAB — COMPREHENSIVE METABOLIC PANEL WITH GFR
ALT: 36 U/L (ref 0–44)
AST: 34 U/L (ref 15–41)
Albumin: 3.9 g/dL (ref 3.5–5.0)
Alkaline Phosphatase: 81 U/L (ref 38–126)
Anion gap: 13 (ref 5–15)
BUN: 9 mg/dL (ref 8–23)
CO2: 24 mmol/L (ref 22–32)
Calcium: 9.1 mg/dL (ref 8.9–10.3)
Chloride: 102 mmol/L (ref 98–111)
Creatinine, Ser: 0.71 mg/dL (ref 0.61–1.24)
GFR, Estimated: >60 mL/min (ref >60)
Glucose, Bld: 102 mg/dL — ABNORMAL HIGH (ref 70–99)
Potassium: 3.8 mmol/L (ref 3.5–5.1)
Sodium: 139 mmol/L (ref 135–145)
Total Bilirubin: <0.2 mg/dL (ref 0.0–1.2)
Total Protein: 7.3 g/dL (ref 6.5–8.1)

## 2024-01-31 LAB — TSH: TSH: 2.472 u[IU]/mL (ref 0.350–4.500)

## 2024-01-31 LAB — MAGNESIUM: Magnesium: 2 mg/dL (ref 1.7–2.4)

## 2024-01-31 MED ORDER — SODIUM CHLORIDE 0.9 % IV SOLN
Freq: Once | INTRAVENOUS | Status: AC
Start: 2024-01-31 — End: 2024-01-31

## 2024-01-31 MED ORDER — SODIUM CHLORIDE 0.9 % IV SOLN
1500.0000 mg | Freq: Once | INTRAVENOUS | Status: AC
  Administered 2024-01-31: 1500 mg via INTRAVENOUS
  Filled 2024-01-31: qty 30

## 2024-01-31 NOTE — Progress Notes (Signed)
 HR 102 and ok to treat verbal order Dr. Davonna.   Patient tolerated chemotherapy with no complaints voiced.  Side effects with management reviewed with understanding verbalized.  Port site clean and dry with no bruising or swelling noted at site.  Good blood return noted before and after administration of chemotherapy.  Band aid applied.  Patient left in satisfactory condition with VSS and no s/s of distress noted.

## 2024-01-31 NOTE — Patient Instructions (Signed)
 [facility] - A DEPT OF [facility]  Discharge Instructions: Thank you for choosing [facility] to provide your oncology and hematology care.  If you have a lab appointment with the Cancer Center - please note that after April 8th, 2024, all labs will be drawn in the cancer center.  You do not have to check in or register with the main entrance as you have in the past but will complete your check-in in the cancer center.  Wear comfortable clothing and clothing appropriate for easy access to any Portacath or PICC line.   We strive to give you quality time with your provider. You may need to reschedule your appointment if you arrive late (15 or more minutes).  Arriving late affects you and other patients whose appointments are after yours.  Also, if you miss three or more appointments without notifying the office, you may be dismissed from the clinic at the provider's discretion.      For prescription refill requests, have your pharmacy contact our office and allow 72 hours for refills to be completed.    Today you received the following chemotherapy and/or immunotherapy agents Imfinzi      To help prevent nausea and vomiting after your treatment, we encourage you to take your nausea medication as directed.  BELOW ARE SYMPTOMS THAT SHOULD BE REPORTED IMMEDIATELY: *FEVER GREATER THAN 100.4 F (38 C) OR HIGHER *CHILLS OR SWEATING *NAUSEA AND VOMITING THAT IS NOT CONTROLLED WITH YOUR NAUSEA MEDICATION *UNUSUAL SHORTNESS OF BREATH *UNUSUAL BRUISING OR BLEEDING *URINARY PROBLEMS (pain or burning when urinating, or frequent urination) *BOWEL PROBLEMS (unusual diarrhea, constipation, pain near the anus) TENDERNESS IN MOUTH AND THROAT WITH OR WITHOUT PRESENCE OF ULCERS (sore throat, sores in mouth, or a toothache) UNUSUAL RASH, SWELLING OR PAIN  UNUSUAL VAGINAL DISCHARGE OR ITCHING   Items with * indicate a potential emergency and should be followed up  as soon as possible or go to the Emergency Department if any problems should occur.  Please show the CHEMOTHERAPY ALERT CARD or IMMUNOTHERAPY ALERT CARD at check-in to the Emergency Department and triage nurse.  Should you have questions after your visit or need to cancel or reschedule your appointment, please contact [facility] [place] - A DEPT OF [facility] [phone]  and follow the prompts.  Office hours are 8:00 a.m. to 4:30 p.m. Monday - Friday. Please note that voicemails left after 4:00 p.m. may not be returned until the following business day.  We are closed weekends and major holidays. You have access to a nurse at all times for urgent questions. Please call the main number to the clinic [phone] and follow the prompts.  For any non-urgent questions, you may also contact your provider using MyChart. We now offer e-Visits for anyone 42 and older to request care online for non-urgent symptoms. For details visit [email or web address].   Also download the MyChart app! Go to the app store, search "MyChart", open the app, select [place], and log in with your MyChart username and password.

## 2024-02-22 ENCOUNTER — Ambulatory Visit: Attending: Cardiology | Admitting: Cardiology

## 2024-02-22 ENCOUNTER — Encounter: Payer: Self-pay | Admitting: Cardiology

## 2024-02-22 VITALS — BP 116/75 | HR 94 | Ht 66.0 in | Wt 183.4 lb

## 2024-02-22 DIAGNOSIS — E782 Mixed hyperlipidemia: Secondary | ICD-10-CM | POA: Diagnosis not present

## 2024-02-22 DIAGNOSIS — I5032 Chronic diastolic (congestive) heart failure: Secondary | ICD-10-CM | POA: Insufficient documentation

## 2024-02-22 DIAGNOSIS — I10 Essential (primary) hypertension: Secondary | ICD-10-CM | POA: Diagnosis present

## 2024-02-22 DIAGNOSIS — I25119 Atherosclerotic heart disease of native coronary artery with unspecified angina pectoris: Secondary | ICD-10-CM | POA: Insufficient documentation

## 2024-02-22 MED ORDER — METOPROLOL SUCCINATE ER 25 MG PO TB24: 12.5000 mg | ORAL_TABLET | Freq: Every day | ORAL | 3 refills | Status: AC

## 2024-02-22 NOTE — Patient Instructions (Signed)
 Medication Instructions:   DECREASE Toprol  XL to 12.5 mg daily  Labwork: None today  Testing/Procedures: None today  Follow-Up: 6 months  Any Other Special Instructions Will Be Listed Below (If Applicable).  If you need a refill on your cardiac medications before your next appointment, please call your pharmacy.

## 2024-02-22 NOTE — Progress Notes (Signed)
 Cardiology Office Note  Date: 02/22/2024   ID: [name], DOB April 17, 1961, MRN [id number]  History of Present Illness: Rodney Mahoney is a 63 y.o. male who last seen in the office back in May 2023.  He is here for a follow-up visit.  He walked into the office back in July complaining of chest discomfort and right sided shoulder pain, also shortness of breath and nausea.  He was taken to the ER for further evaluation.  Symptoms were found to be more atypical and somewhat positional on further assessment.  High-sensitivity troponin I levels were normal.  ECG reviewed below.  No acute changes by chest x-ray with post-radiation changes noted in the left upper lobe.  Shoulder films showed no acute findings.  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  He has undergone XRT and is undergoing monthly durvalumab .  He does not report any worsening shortness of breath or chest discomfort, right shoulder symptoms sound more musculoskeletal based on description today.  He has not had to take any nitroglycerin .  Does feel somewhat lightheaded when he stands up quickly, has had no palpitations or syncope.  Orthostatic measurements were normal today, although his blood pressure is low normal at baseline.  Medications reviewed.  We discussed reducing Toprol -XL to 12.5 mg daily for now.  His LDL was 55 in June and on Lipitor  80 mg daily.  Physical Exam: VS:  BP 116/75 (Cuff Size: Normal)   Pulse 94   Ht 5' 6 (1.676 m)   Wt 183 lb 6.4 oz (83.2 kg)   SpO2 94%   BMI 29.60 kg/m , BMI Body mass index is 29.6 kg/m.  Wt Readings from Last 3 Encounters:  02/22/24 183 lb 6.4 oz (83.2 kg)  01/31/24 177 lb 1.6 oz (80.3 kg)  01/04/24 175 lb (79.4 kg)    General: Patient appears comfortable at rest. HEENT: Conjunctiva and lids normal. Neck: Supple, no elevated JVP or carotid bruits. Lungs: Clear to auscultation, nonlabored  breathing at rest. Cardiac: Regular rate and rhythm, no S3 or significant systolic murmur. Extremities: No pitting edema.  ECG:  An ECG dated 01/04/2024 was personally reviewed today and demonstrated:  Minus rhythm with LVH, nonspecific ST-T wave abnormalities, old inferior infarct pattern.  Labwork: 01/31/2024: ALT 36; AST 34; BUN 9; Creatinine, Ser 0.71; Hemoglobin 14.3; Magnesium  2.0; Platelets 207; Potassium 3.8; Sodium 139; TSH 2.472     Component Value Date/Time   CHOL 111 11/26/2023 1344   TRIG 85 11/26/2023 1344   HDL 39 (L) 11/26/2023 1344   CHOLHDL 2.8 11/26/2023 1344   CHOLHDL 3.1 07/04/2021 1606   LDLCALC 55 11/26/2023 1344   LDLCALC 64 07/04/2021 1606   Other Studies Reviewed Today:  No interval cardiac testing for review today.  Assessment and Plan:  1.  CAD status post DES to the mid circumflex in 2019 with chronically occluded RCA managed medically.  LVEF 50 to 55% by echocardiogram in March 2023.  Reports no clear-cut angina or interval nitroglycerin  use at this time.  I reviewed his ECG from ER workup in July at which point cardiac enzymes were normal.  Plan to continue medical therapy for now including aspirin  81 mg daily, Lipitor  80 mg daily, and as needed nitroglycerin .   2.  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  Plan to update echocardiogram.  Also reduce Toprol -XL to 12.5 mg daily  for now.   3.  Mixed hyperlipidemia.  LDL 55 in June.  Continue Lipitor  80 mg daily.   4.  Primary hypertension.  Reducing Toprol -XL to 12.5 mg daily as discussed.  Disposition:  Follow up 6 months.  Signed, Jayson JUDITHANN Sierras, M.D., F.A.C.C. [facility]

## 2024-02-28 ENCOUNTER — Inpatient Hospital Stay: Attending: Hematology

## 2024-02-28 ENCOUNTER — Inpatient Hospital Stay

## 2024-02-28 VITALS — BP 126/93 | HR 103 | Temp 97.3°F | Resp 19 | Wt 178.8 lb

## 2024-02-28 VITALS — BP 156/107 | HR 123 | Temp 97.4°F | Resp 19

## 2024-02-28 DIAGNOSIS — C3492 Malignant neoplasm of unspecified part of left bronchus or lung: Secondary | ICD-10-CM

## 2024-02-28 DIAGNOSIS — F17210 Nicotine dependence, cigarettes, uncomplicated: Secondary | ICD-10-CM | POA: Diagnosis not present

## 2024-02-28 DIAGNOSIS — C3412 Malignant neoplasm of upper lobe, left bronchus or lung: Secondary | ICD-10-CM | POA: Diagnosis not present

## 2024-02-28 DIAGNOSIS — Z95828 Presence of other vascular implants and grafts: Secondary | ICD-10-CM

## 2024-02-28 DIAGNOSIS — Z5112 Encounter for antineoplastic immunotherapy: Secondary | ICD-10-CM | POA: Insufficient documentation

## 2024-02-28 DIAGNOSIS — Z79620 Long term (current) use of immunosuppressive biologic: Secondary | ICD-10-CM | POA: Diagnosis not present

## 2024-02-28 LAB — CBC WITH DIFFERENTIAL/PLATELET
Abs Immature Granulocytes: 0.02 K/uL (ref 0.00–0.07)
Basophils Absolute: 0 K/uL (ref 0.0–0.1)
Basophils Relative: 1 %
Eosinophils Absolute: 0.1 K/uL (ref 0.0–0.5)
Eosinophils Relative: 2 %
HCT: 43 % (ref 39.0–52.0)
Hemoglobin: 14.5 g/dL (ref 13.0–17.0)
Immature Granulocytes: 0 %
Lymphocytes Relative: 20 %
Lymphs Abs: 1.6 K/uL (ref 0.7–4.0)
MCH: 31.8 pg (ref 26.0–34.0)
MCHC: 33.7 g/dL (ref 30.0–36.0)
MCV: 94.3 fL (ref 80.0–100.0)
Monocytes Absolute: 0.7 K/uL (ref 0.1–1.0)
Monocytes Relative: 9 %
Neutro Abs: 5.2 K/uL (ref 1.7–7.7)
Neutrophils Relative %: 68 %
Platelets: 228 K/uL (ref 150–400)
RBC: 4.56 MIL/uL (ref 4.22–5.81)
RDW: 15.2 % (ref 11.5–15.5)
WBC: 7.6 K/uL (ref 4.0–10.5)
nRBC: 0 % (ref 0.0–0.2)

## 2024-02-28 LAB — COMPREHENSIVE METABOLIC PANEL WITH GFR
ALT: 26 U/L (ref 0–44)
AST: 24 U/L (ref 15–41)
Albumin: 4.1 g/dL (ref 3.5–5.0)
Alkaline Phosphatase: 82 U/L (ref 38–126)
Anion gap: 13 (ref 5–15)
BUN: 10 mg/dL (ref 8–23)
CO2: 24 mmol/L (ref 22–32)
Calcium: 9 mg/dL (ref 8.9–10.3)
Chloride: 99 mmol/L (ref 98–111)
Creatinine, Ser: 0.75 mg/dL (ref 0.61–1.24)
GFR, Estimated: >60 mL/min (ref >60)
Glucose, Bld: 91 mg/dL (ref 70–99)
Potassium: 3.7 mmol/L (ref 3.5–5.1)
Sodium: 136 mmol/L (ref 135–145)
Total Bilirubin: 1.1 mg/dL (ref 0.0–1.2)
Total Protein: 7.3 g/dL (ref 6.5–8.1)

## 2024-02-28 LAB — MAGNESIUM: Magnesium: 2.2 mg/dL (ref 1.7–2.4)

## 2024-02-28 LAB — TSH: TSH: 2.682 u[IU]/mL (ref 0.350–4.500)

## 2024-02-28 MED ORDER — SODIUM CHLORIDE 0.9 % IV SOLN: Freq: Once | INTRAVENOUS | Status: AC

## 2024-02-28 MED ORDER — SODIUM CHLORIDE 0.9 % IV SOLN
1500.0000 mg | Freq: Once | INTRAVENOUS | Status: AC
  Administered 2024-02-28: 1500 mg via INTRAVENOUS
  Filled 2024-02-28: qty 30

## 2024-02-28 NOTE — Progress Notes (Signed)
 Patient presents today for Imfinzi  infusion per providers order.  Vital signs and labs within parameters for treatment.  Patient has no new complaints at this time.

## 2024-03-05 ENCOUNTER — Encounter (HOSPITAL_COMMUNITY): Payer: Self-pay | Admitting: Oncology

## 2024-03-15 ENCOUNTER — Encounter (HOSPITAL_COMMUNITY): Payer: Self-pay | Admitting: Oncology

## 2024-03-20 ENCOUNTER — Ambulatory Visit (HOSPITAL_COMMUNITY)
Admission: RE | Admit: 2024-03-20 | Discharge: 2024-03-20 | Disposition: A | Discharge status: Home / Self Care | Payer: Self-pay | Source: Ambulatory Visit | Attending: Hematology | Admitting: Hematology

## 2024-03-20 DIAGNOSIS — C3492 Malignant neoplasm of unspecified part of left bronchus or lung: Secondary | ICD-10-CM | POA: Insufficient documentation

## 2024-03-20 MED ORDER — IOHEXOL 300 MG/ML  SOLN
100.0000 mL | Freq: Once | INTRAMUSCULAR | Status: AC | PRN
  Administered 2024-03-20: 100 mL via INTRAVENOUS

## 2024-03-23 NOTE — Progress Notes (Signed)
 SABRA

## 2024-03-27 ENCOUNTER — Inpatient Hospital Stay (HOSPITAL_BASED_OUTPATIENT_CLINIC_OR_DEPARTMENT_OTHER): Payer: Self-pay | Admitting: Oncology

## 2024-03-27 ENCOUNTER — Inpatient Hospital Stay: Payer: Self-pay | Admitting: Oncology

## 2024-03-27 ENCOUNTER — Inpatient Hospital Stay: Payer: Self-pay

## 2024-03-27 ENCOUNTER — Inpatient Hospital Stay: Payer: Self-pay | Attending: Hematology

## 2024-03-27 ENCOUNTER — Encounter (HOSPITAL_COMMUNITY): Payer: Self-pay | Admitting: Oncology

## 2024-03-27 VITALS — BP 115/69 | HR 85 | Temp 97.9°F | Resp 18

## 2024-03-27 VITALS — BP 129/82 | HR 102 | Temp 96.3°F | Resp 20 | Wt 180.1 lb

## 2024-03-27 DIAGNOSIS — C3492 Malignant neoplasm of unspecified part of left bronchus or lung: Secondary | ICD-10-CM

## 2024-03-27 DIAGNOSIS — Z79899 Other long term (current) drug therapy: Secondary | ICD-10-CM | POA: Insufficient documentation

## 2024-03-27 DIAGNOSIS — Z5112 Encounter for antineoplastic immunotherapy: Secondary | ICD-10-CM | POA: Insufficient documentation

## 2024-03-27 DIAGNOSIS — G620 Drug-induced polyneuropathy: Secondary | ICD-10-CM | POA: Insufficient documentation

## 2024-03-27 DIAGNOSIS — C3412 Malignant neoplasm of upper lobe, left bronchus or lung: Secondary | ICD-10-CM | POA: Insufficient documentation

## 2024-03-27 DIAGNOSIS — F17200 Nicotine dependence, unspecified, uncomplicated: Secondary | ICD-10-CM | POA: Insufficient documentation

## 2024-03-27 DIAGNOSIS — Z79620 Long term (current) use of immunosuppressive biologic: Secondary | ICD-10-CM | POA: Insufficient documentation

## 2024-03-27 LAB — COMPREHENSIVE METABOLIC PANEL WITH GFR
ALT: 32 U/L (ref 0–44)
AST: 30 U/L (ref 15–41)
Albumin: 4.2 g/dL (ref 3.5–5.0)
Alkaline Phosphatase: 95 U/L (ref 38–126)
Anion gap: 10 (ref 5–15)
BUN: 10 mg/dL (ref 8–23)
CO2: 28 mmol/L (ref 22–32)
Calcium: 9.8 mg/dL (ref 8.9–10.3)
Chloride: 100 mmol/L (ref 98–111)
Creatinine, Ser: 0.77 mg/dL (ref 0.61–1.24)
GFR, Estimated: >60 mL/min (ref >60)
Glucose, Bld: 96 mg/dL (ref 70–99)
Potassium: 4.7 mmol/L (ref 3.5–5.1)
Sodium: 138 mmol/L (ref 135–145)
Total Bilirubin: 0.4 mg/dL (ref 0.0–1.2)
Total Protein: 7.2 g/dL (ref 6.5–8.1)

## 2024-03-27 LAB — CBC WITH DIFFERENTIAL/PLATELET
Abs Immature Granulocytes: 0 K/uL (ref 0.00–0.07)
Basophils Absolute: 0 K/uL (ref 0.0–0.1)
Basophils Relative: 1 %
Eosinophils Absolute: 0.2 K/uL (ref 0.0–0.5)
Eosinophils Relative: 3 %
HCT: 45.2 % (ref 39.0–52.0)
Hemoglobin: 15 g/dL (ref 13.0–17.0)
Immature Granulocytes: 0 %
Lymphocytes Relative: 27 %
Lymphs Abs: 1.6 K/uL (ref 0.7–4.0)
MCH: 31.5 pg (ref 26.0–34.0)
MCHC: 33.2 g/dL (ref 30.0–36.0)
MCV: 95 fL (ref 80.0–100.0)
Monocytes Absolute: 0.6 K/uL (ref 0.1–1.0)
Monocytes Relative: 10 %
Neutro Abs: 3.5 K/uL (ref 1.7–7.7)
Neutrophils Relative %: 59 %
Platelets: 223 K/uL (ref 150–400)
RBC: 4.76 MIL/uL (ref 4.22–5.81)
RDW: 14.6 % (ref 11.5–15.5)
WBC: 6 K/uL (ref 4.0–10.5)
nRBC: 0 % (ref 0.0–0.2)

## 2024-03-27 LAB — TSH: TSH: 2.83 u[IU]/mL (ref 0.350–4.500)

## 2024-03-27 LAB — MAGNESIUM: Magnesium: 2.1 mg/dL (ref 1.7–2.4)

## 2024-03-27 MED ORDER — SODIUM CHLORIDE 0.9 % IV SOLN
1500.0000 mg | Freq: Once | INTRAVENOUS | Status: AC
  Administered 2024-03-27: 1500 mg via INTRAVENOUS
  Filled 2024-03-27: qty 30

## 2024-03-27 MED ORDER — SODIUM CHLORIDE 0.9 % IV SOLN: Freq: Once | INTRAVENOUS | Status: AC

## 2024-03-27 MED ORDER — ALTEPLASE 2 MG IJ SOLR
2.0000 mg | Freq: Once | INTRAMUSCULAR | Status: AC | PRN
  Administered 2024-03-27: 2 mg
  Filled 2024-03-27: qty 2

## 2024-03-27 MED ORDER — GABAPENTIN 300 MG PO CAPS
300.0000 mg | ORAL_CAPSULE | Freq: Every day | ORAL | 2 refills | Status: AC
Start: 2024-03-27 — End: ?

## 2024-03-27 NOTE — Progress Notes (Signed)
 OK to proceed with treatment with today's HR 102  V.O. Dr Ivery Molt, PharmD

## 2024-03-27 NOTE — Progress Notes (Unsigned)
 Patient's port flushes with ease. No pain, discomfort, redness, or swelling when flushing. No blood return noted. Alteplase given at 1203. Labs drawn peripherally. MD and treatment team made aware. No new orders given at this time.  Rodney Mahoney

## 2024-03-27 NOTE — Patient Instructions (Signed)

## 2024-03-27 NOTE — Progress Notes (Signed)
 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 male was transferred to my care after his prior physician has left.   ASSESSMENT & PLAN:   Assessment & Plan: Rodney Mahoney  is a 63 y.o. male with metastatic squamous cell carcinoma  Assessment and Plan Assessment & Plan Metastatic non-small cell lung cancer of the left lung Extensive oncology history below Completed chemoimmunotherapy with carboplatin , paclitaxel , tremelimumab  and durvalumab .  Currently on maintenance Durvalumab  since December 2024.  -We reviewed the recent CT scan together with increase in size of the lung lesions.  These lesions need further evaluation. - Will order PET scan to better evaluate the changing areas in the CT scan. -Will repeat Guardant360 for mutation analysis. - Consider changing treatment if PET scan confirms cancer progression. - Labs reviewed today: CBC: Normal, CMP: WNL, TSH: Normal - Physical exam stable today.  Reports no side effects from immunotherapy - Will proceed with Durvalumab  infusion today. - If patient has progression on PET scan, next line of treatment would be Ramucirumab/Docetaxel.   Return to clinic in 3 weeks with PET scan to discuss results and further management.  Chemotherapy-induced peripheral neuropathy Reports tingling in hands consistent with neuropathy.  - Will prescribe gabapentin 300 mg every night. - Educated on gabapentin use for neuropathy.  Tobacco use Continues smoking despite lung cancer diagnosis.  - Discussed risks of continued smoking in the context  of lung cancer. - Patient is not ready to quit yet   The patient understands the plans discussed today and is in agreement with them.  He knows to contact our office if he develops concerns prior to his next appointment.  40 minutes of total time was spent for this patient encounter, including preparation,review of records,  face-to-face counseling with the patient and coordination of care, physical exam, and documentation of the encounter.    LILLETTE Verneta SAUNDERS Teague,acting as a Neurosurgeon for [name], MD.,have documented all relevant documentation on the behalf of [name], MD,as directed by  [name], MD while in the presence of [name], MD.  I, [name] MD, have reviewed the above documentation for accuracy and completeness, and I agree with the above.    [name], MD  [facility] [place] - A DEPT OF [facility] [address] [place] [address] Dept: [phone] Dept Fax: [phone]   Orders Placed This Encounter  Procedures   NM PET Image Restag (PS) Skull Base To Thigh    Standing Status:   Future    Expected Date:   04/03/2024    Expiration Date:   03/27/2025    If indicated for the ordered procedure, I authorize the administration of a radiopharmaceutical per Radiology protocol:   Yes    Preferred imaging location?:   Rodney Mahoney     ONCOLOGY HISTORY:   I have reviewed his chart and materials related to his cancer extensively and collaborated history with the patient. Summary of oncologic history is as follows:   Diagnosis: Stage IV (T4 N2 M1) squamous cell carcinoma of the left  lung   -Presentation: Chest tightness/neck tightness -11/30/2022: CTA chest: Left upper lobe mass with central necrosis worrisome for primary bronchogenic carcinoma. Bulky mediastinal, left hilar and left lower neck lymphadenopathy worrisome for metastatic disease. Lytic lesion in the right superior  sternum with soft tissue component extending into the anterior mediastinum worrisome for metastatic disease. 8 mm right upper lobe pulmonary nodule worrisome for metastatic disease. -12/01/2022: Bronchoscopy with LUL biopsy and brushing.  Pathology: Invasive moderately differentiated squamous cell carcinoma  -Guardant360: PIK3CA, TP 53, MSI high not detected. -12/16/2022: Initial PET: Large LEFT upper lobe mass consistent with primary bronchogenic carcinoma. Direct invasion of the AP window and LEFT superior hilum. Extensive metastatic adenopathy to the AP window, contralateral paratracheal nodes as well as bilateral supraclavicular nodes. Bulky LEFT supraclavicular disease. Direct skeletal invasion of the manubrium and first RIGHT rib costamanubrial junctio. Soft tissue extension into the medial border of the overlying RIGHT pectoralis muscle. Discrete hypermetabolic RIGHT upper lobe pulmonary nodule consistent with metastatic disease. No evidence metastatic disease in the abdomen pelvis. -12/23/2022: 1 dose of concurrent chemo XRT -12/29/2022: Left lung and left bronchus radiation therapy completed -12/28/2022: MRI Brain: No evidence of intracranial metastatic disease.  -01/03/2023: [facility] NGS: TMB-high.  MSI-stable.  PIK3CA and TP 53 mutations present.  No other targetable mutations.  PD-L1 TPS 0%.  HER2 IHC 0. -01/13/2023-04/28/2023: 6 cycles of Carboplatin , paclitaxel , durvalumab  and tremelimumab  completed -05/20/2023-Current: Maintenance Durvalumab  every 28 days -03/20/2024: CT CAP: Evaluation of the chest is limited by breath motion artifact throughout. Within this limitation, similar post treatment appearance of the chest with dense fibrotic scarring and volume loss of the left upper lobe, treated mass approximately 3.1 x 2.6 cm. Interval increase in size of an irregular subpleural opacity in the peripheral superior segment left lower lobe measuring 1.8 x 1.4 cm. Interval increase in scattered,  irregular ground-glass opacity in the superior segment left lower lobe. This could reflect developing radiation change or other nonspecific infection or inflammation but is modestly concerning for locally recurrent/metastatic disease.  Current Treatment:  Maintenance Durvalumab  every 28 days  INTERVAL HISTORY:   Discussed the use of AI scribe software for clinical note transcription with the patient, who gave verbal consent to proceed.  History of Present Illness JAVEN Mahoney is a 63 year old male with non-small cell lung cancer who for follow-up and establishment of care with me for his metastatic non-small cell lung carcinoma.  He has a history of non-small cell lung cancer, specifically squamous cell lung cancer, and is currently undergoing maintenance treatment with durvalumab . His cancer had been responding well to treatment, with tumors decreasing in size until recently. Recent scans show some spots in his lungs have increased in size, which is a new development.   He experiences tingling in his hands. He is not currently taking any medication for this symptom. No numbness in hands but confirms tingling sensations.  He continues to smoke despite his lung cancer diagnosis. He believes his cancer may have been caused by exposure to cotton dust at his workplace rather than smoking. He has not been working for almost two years.   I have reviewed the past medical history, past surgical history, social history and family history with the patient and they are unchanged from previous note.  ALLERGIES:  has no known allergies.  MEDICATIONS:  Current Outpatient Medications  Medication Sig Dispense Refill   acetaminophen  (TYLENOL ) 500 MG tablet Take 500 mg by mouth every 6 (six) hours as needed for mild pain.  aspirin  EC 81 MG tablet Take 1 tablet (81 mg total) by mouth daily. Swallow whole. 90 tablet 3   atorvastatin  (LIPITOR ) 80 MG tablet Take 1 tablet (80 mg total) by mouth daily.  90 tablet 3   gabapentin (NEURONTIN) 300 MG capsule Take 1 capsule (300 mg total) by mouth at bedtime. 30 capsule 2   lidocaine -prilocaine  (EMLA ) cream Apply to affected area once 30 g 3   metoprolol  succinate (TOPROL  XL) 25 MG 24 hr tablet Take 0.5 tablets (12.5 mg total) by mouth daily. 45 tablet 3   nitroGLYCERIN  (NITROSTAT ) 0.4 MG SL tablet Place 1 tablet (0.4 mg total) under the tongue every 5 (five) minutes as needed. 25 tablet 0   sildenafil  (VIAGRA ) 50 MG tablet TAKE 1 TABLET BY MOUTH ONCE DAILY AS NEEDED FOR ERECTILE DYSFUNCTION (Patient not taking: Reported on 03/27/2024) 4 tablet 0   No current facility-administered medications for this visit.    REVIEW OF SYSTEMS:   Constitutional: Denies fevers, chills or abnormal weight loss Eyes: Denies blurriness of vision Ears, nose, mouth, throat, and face: Denies mucositis or sore throat Respiratory: Denies cough, dyspnea or wheezes Cardiovascular: Denies palpitation, chest discomfort or lower extremity swelling Gastrointestinal:  Denies nausea, heartburn or change in bowel habits Skin: Denies abnormal skin rashes Lymphatics: Denies new lymphadenopathy or easy bruising Neurological:Denies numbness, tingling or new weaknesses Behavioral/Psych: Mood is stable, no new changes  All other systems were reviewed with the patient and are negative.   VITALS:  There were no vitals taken for this visit.  Wt Readings from Last 3 Encounters:  03/27/24 180 lb 1.9 oz (81.7 kg)  02/28/24 178 lb 12.7 oz (81.1 kg)  02/22/24 183 lb 6.4 oz (83.2 kg)    There is no height or weight on file to calculate BMI.  Performance status (ECOG): 0 - Asymptomatic  PHYSICAL EXAM:   GENERAL:alert, no distress and comfortable SKIN: skin color, texture, turgor are normal, no rashes or significant lesions LYMPH:  no palpable lymphadenopathy in the cervical, axillary or inguinal LUNGS: clear to auscultation and percussion with normal breathing effort HEART:  regular rate & rhythm and no murmurs and no lower extremity edema ABDOMEN:abdomen soft, non-tender and normal bowel sounds Musculoskeletal:no cyanosis of digits and no clubbing  NEURO: alert & oriented x 3 with fluent speech, no focal motor/sensory deficits  LABORATORY DATA:  I have reviewed the data as listed   Lab Results  Component Value Date   WBC 6.0 03/27/2024   NEUTROABS 3.5 03/27/2024   HGB 15.0 03/27/2024   HCT 45.2 03/27/2024   MCV 95.0 03/27/2024   PLT 223 03/27/2024      Chemistry      Component Value Date/Time   NA 138 03/27/2024 1136   NA 137 10/28/2022 1501   K 4.7 03/27/2024 1136   CL 100 03/27/2024 1136   CO2 28 03/27/2024 1136   BUN 10 03/27/2024 1136   BUN 10 10/28/2022 1501   CREATININE 0.77 03/27/2024 1136   CREATININE 0.62 01/03/2024 1300   CREATININE 0.78 07/04/2021 1606      Component Value Date/Time   CALCIUM  9.8 03/27/2024 1136   ALKPHOS 95 03/27/2024 1136   AST 30 03/27/2024 1136   AST 23 01/03/2024 1300   ALT 32 03/27/2024 1136   ALT 21 01/03/2024 1300   BILITOT 0.4 03/27/2024 1136   BILITOT 1.0 01/03/2024 1300       Latest Reference Range & Units 03/27/24 11:36  TSH 0.350 - 4.500 uIU/mL 2.830  RADIOGRAPHIC STUDIES: I have personally reviewed the radiological images as listed and agreed with the findings in the report.  CT CHEST ABDOMEN PELVIS W CONTRAST CLINICAL DATA:  Metastatic disease evaluation, squamous cell lung cancer * Tracking Code: BO *  EXAM: CT CHEST, ABDOMEN, AND PELVIS WITH CONTRAST  TECHNIQUE: Multidetector CT imaging of the chest, abdomen and pelvis was performed following the standard protocol during bolus administration of intravenous contrast.  RADIATION DOSE REDUCTION: This exam was performed according to the departmental dose-optimization program which includes automated exposure control, adjustment of the mA and/or kV according to patient size and/or use of iterative reconstruction  technique.  CONTRAST:  OMNIPAQUE  IOHEXOL  300 MG/ML  SOLN  COMPARISON:  12/28/2023  FINDINGS: CT CHEST FINDINGS  Cardiovascular: Aortic atherosclerosis. Normal heart size. Three-vessel coronary artery calcifications. No pericardial effusion.  Mediastinum/Nodes: No enlarged mediastinal, hilar, or axillary lymph nodes. Thyroid  gland, trachea, and esophagus demonstrate no significant findings.  Lungs/Pleura: Examination of the lungs generally somewhat limited by breath motion artifact throughout. Similar post treatment appearance of the chest with dense fibrotic scarring and volume loss of the left upper lobe, treated mass approximately 3.1 x 2.6 cm (series 4, image 66). There has however been interval increase in size of an irregular subpleural opacity in the peripheral superior segment left lower lobe measuring 1.8 x 1.4 cm (series 4, image 40). Interval increase in scattered, irregular ground-glass opacity in the superior segment left lower lobe (series 4, image 50). Unchanged paramedian fibrosis and ground-glass in the right lung (series 4, image 55). Mild centrilobular emphysema. No pleural effusion or pneumothorax.  Musculoskeletal: No chest wall abnormality. No acute osseous findings. Unchanged, heterogeneous sclerosis of the manubrium (series 5, image 57).  CT ABDOMEN PELVIS FINDINGS  Hepatobiliary: No solid liver abnormality is seen. No gallstones, gallbladder wall thickening, or biliary dilatation.  Pancreas: Unremarkable. No pancreatic ductal dilatation or surrounding inflammatory changes.  Spleen: Normal in size without significant abnormality.  Adrenals/Urinary Tract: Adrenal glands are unremarkable. Kidneys are normal, without renal calculi, solid lesion, or hydronephrosis. Bladder is unremarkable.  Stomach/Bowel: Stomach is within normal limits. Appendix appears normal. No evidence of bowel wall thickening, distention, or inflammatory changes.  Sigmoid diverticulosis.  Vascular/Lymphatic: Aortic atherosclerosis. No enlarged abdominal or pelvic lymph nodes.  Reproductive: Prostatomegaly.  Other: No abdominal wall hernia or abnormality. No ascites.  Musculoskeletal: No acute osseous findings.  IMPRESSION: 1. Evaluation of the chest is limited by breath motion artifact throughout. Within this limitation, similar post treatment appearance of the chest with dense fibrotic scarring and volume loss of the left upper lobe, treated mass approximately 3.1 x 2.6 cm. 2. Interval increase in size of an irregular subpleural opacity in the peripheral superior segment left lower lobe measuring 1.8 x 1.4 cm. Interval increase in scattered, irregular ground-glass opacity in the superior segment left lower lobe. This could reflect developing radiation change or other nonspecific infection or inflammation but is modestly concerning for locally recurrent/metastatic disease. PET-CT may be helpful to assess for abnormal metabolic activity. 3. Unchanged paramedian fibrosis and ground-glass in the right lung. 4. No evidence of lymphadenopathy or metastatic disease in the abdomen or pelvis. 5. Unchanged, heterogeneous sclerosis of the manubrium, which may reflect treated osseous metastasis. 6. Emphysema. 7. Coronary artery disease.  Aortic Atherosclerosis (ICD10-I70.0) and Emphysema (ICD10-J43.9).  Electronically Signed   By: Marolyn JONETTA Jaksch M.D.   On: 03/23/2024 09:31

## 2024-03-27 NOTE — Progress Notes (Signed)
 Patient presents today for Imfinzi  infusion per providers order.  Vital signs and labs reviewed by MD.  Message received from Isaiah Piety RN/Dr. Davonna patient ok for treatment.  Stable during infusion without adverse affects.  Vital signs stable.  No complaints at this time.  Discharge from clinic ambulatory in stable condition.  Alert and oriented X 3.  Follow up with [facility] as scheduled.

## 2024-03-27 NOTE — Patient Instructions (Signed)
 [facility] - A DEPT OF [facility]  Discharge Instructions: Thank you for choosing [facility] to provide your oncology and hematology care.  If you have a lab appointment with the Cancer Center - please note that after April 8th, 2024, all labs will be drawn in the cancer center.  You do not have to check in or register with the main entrance as you have in the past but will complete your check-in in the cancer center.  Wear comfortable clothing and clothing appropriate for easy access to any Portacath or PICC line.   We strive to give you quality time with your provider. You may need to reschedule your appointment if you arrive late (15 or more minutes).  Arriving late affects you and other patients whose appointments are after yours.  Also, if you miss three or more appointments without notifying the office, you may be dismissed from the clinic at the provider's discretion.      For prescription refill requests, have your pharmacy contact our office and allow 72 hours for refills to be completed.    Today you received the following chemotherapy and/or immunotherapy agents Imfinzi      To help prevent nausea and vomiting after your treatment, we encourage you to take your nausea medication as directed.  BELOW ARE SYMPTOMS THAT SHOULD BE REPORTED IMMEDIATELY: *FEVER GREATER THAN 100.4 F (38 C) OR HIGHER *CHILLS OR SWEATING *NAUSEA AND VOMITING THAT IS NOT CONTROLLED WITH YOUR NAUSEA MEDICATION *UNUSUAL SHORTNESS OF BREATH *UNUSUAL BRUISING OR BLEEDING *URINARY PROBLEMS (pain or burning when urinating, or frequent urination) *BOWEL PROBLEMS (unusual diarrhea, constipation, pain near the anus) TENDERNESS IN MOUTH AND THROAT WITH OR WITHOUT PRESENCE OF ULCERS (sore throat, sores in mouth, or a toothache) UNUSUAL RASH, SWELLING OR PAIN  UNUSUAL VAGINAL DISCHARGE OR ITCHING   Items with * indicate a potential emergency and should be followed up  as soon as possible or go to the Emergency Department if any problems should occur.  Please show the CHEMOTHERAPY ALERT CARD or IMMUNOTHERAPY ALERT CARD at check-in to the Emergency Department and triage nurse.  Should you have questions after your visit or need to cancel or reschedule your appointment, please contact [facility] [place] - A DEPT OF [facility] [phone]  and follow the prompts.  Office hours are 8:00 a.m. to 4:30 p.m. Monday - Friday. Please note that voicemails left after 4:00 p.m. may not be returned until the following business day.  We are closed weekends and major holidays. You have access to a nurse at all times for urgent questions. Please call the main number to the clinic [phone] and follow the prompts.  For any non-urgent questions, you may also contact your provider using MyChart. We now offer e-Visits for anyone 42 and older to request care online for non-urgent symptoms. For details visit [email or web address].   Also download the MyChart app! Go to the app store, search "MyChart", open the app, select [place], and log in with your MyChart username and password.

## 2024-03-27 NOTE — Progress Notes (Signed)
   03/27/24 1400  Spiritual Encounters  Type of Visit Initial  Care provided to: Patient  Referral source  (Chaplain Rounding on the floor)  Reason for visit  (Introduction to Spiritual Care)  OnCall Visit No  Spiritual Framework  Presenting Themes Significant life change;Courage hope and growth;Caregiving needs;Coping tools  Strengths Amara coveys emotional strengh showing up in stability.  He states he believes in God and trusts him.  Patient Stress Factors Health changes (See comments below)  Interventions  Spiritual Care Interventions Made Compassionate presence;Reflective listening;Explored values/beliefs/practices/strengths  Intervention Outcomes  Outcomes Connection to spiritual care;Awareness of support  Spiritual Care Plan  Spiritual Care Issues Still Outstanding Chaplain will continue to follow   Reason for Visit: Chaplain identified Pt on the schedule as a Pt I had not connected with yet and visited to deliver introduction to Spiritual Care  Description of Visit: Upon arrival I found Rodney Mahoney seated in the recliner receiving treatment, and  there was no support person.  I introduced myself as the chaplain for the cancer center and offered a brief education on the role of a chaplain and the support we can offer to our patients, caregivers, and staff.      [name] was receptive to me and he began to share right away about what was going on for him today.  He has been receiving treatment for lung cancer for over a year and every report until today has been good.  But today, he is told that there is new growth on the right side of his lungs.  He is understandably concerned.   Today was also his first appointment with Dr. Davonna, and his mind is tying together the new doctor with his new cancer growth.  As I explored his emotions regarding this it was mostly frustration and questing, as he states, Was I not receiving the medication I was supposed to receive? Did the new doctor switch  something?   Throughout our conversation [name] remained calm and even.  He is not frightened by this new development and he isn't panicking- he's simply trying to process it all in his mind.   In exploring [name] care giving needs and his support systems he states that he has plenty of people to talk with and help him.   [name] is open to me following his care and checking in on him from time to time.  Plan of Care: I will continue to follow up with [name] on a monthly basis.   Maude Roll, MDiv  Chaplain, [facility] [email or web address] [phone]

## 2024-04-13 ENCOUNTER — Ambulatory Visit (HOSPITAL_COMMUNITY)
Admission: RE | Admit: 2024-04-13 | Discharge: 2024-04-13 | Disposition: A | Discharge status: Home / Self Care | Payer: Self-pay | Source: Ambulatory Visit | Attending: Oncology | Admitting: Oncology

## 2024-04-13 DIAGNOSIS — C3492 Malignant neoplasm of unspecified part of left bronchus or lung: Secondary | ICD-10-CM | POA: Insufficient documentation

## 2024-04-13 MED ORDER — FLUDEOXYGLUCOSE F - 18 (FDG) INJECTION
9.6000 | Freq: Once | INTRAVENOUS | Status: AC | PRN
  Administered 2024-04-13: 9.6 via INTRAVENOUS

## 2024-04-14 ENCOUNTER — Encounter (HOSPITAL_COMMUNITY): Payer: Self-pay | Admitting: Oncology

## 2024-04-15 ENCOUNTER — Encounter (HOSPITAL_COMMUNITY): Payer: Self-pay | Admitting: Oncology

## 2024-04-19 ENCOUNTER — Encounter (HOSPITAL_COMMUNITY): Payer: Self-pay | Admitting: Oncology

## 2024-04-19 ENCOUNTER — Other Ambulatory Visit: Payer: Self-pay | Admitting: Oncology

## 2024-04-19 ENCOUNTER — Other Ambulatory Visit: Payer: Self-pay | Admitting: Internal Medicine

## 2024-04-19 ENCOUNTER — Inpatient Hospital Stay: Payer: Self-pay | Attending: Hematology | Admitting: Oncology

## 2024-04-19 VITALS — BP 135/85 | HR 103 | Temp 97.7°F | Resp 19 | Wt 182.5 lb

## 2024-04-19 DIAGNOSIS — G620 Drug-induced polyneuropathy: Secondary | ICD-10-CM | POA: Insufficient documentation

## 2024-04-19 DIAGNOSIS — M7918 Myalgia, other site: Secondary | ICD-10-CM | POA: Insufficient documentation

## 2024-04-19 DIAGNOSIS — Z923 Personal history of irradiation: Secondary | ICD-10-CM | POA: Insufficient documentation

## 2024-04-19 DIAGNOSIS — Z5112 Encounter for antineoplastic immunotherapy: Secondary | ICD-10-CM | POA: Diagnosis present

## 2024-04-19 DIAGNOSIS — T451X5A Adverse effect of antineoplastic and immunosuppressive drugs, initial encounter: Secondary | ICD-10-CM | POA: Diagnosis not present

## 2024-04-19 DIAGNOSIS — N529 Male erectile dysfunction, unspecified: Secondary | ICD-10-CM

## 2024-04-19 DIAGNOSIS — F1090 Alcohol use, unspecified, uncomplicated: Secondary | ICD-10-CM | POA: Insufficient documentation

## 2024-04-19 DIAGNOSIS — Z79620 Long term (current) use of immunosuppressive biologic: Secondary | ICD-10-CM | POA: Diagnosis not present

## 2024-04-19 DIAGNOSIS — F17210 Nicotine dependence, cigarettes, uncomplicated: Secondary | ICD-10-CM | POA: Diagnosis not present

## 2024-04-19 DIAGNOSIS — C3412 Malignant neoplasm of upper lobe, left bronchus or lung: Secondary | ICD-10-CM | POA: Diagnosis not present

## 2024-04-19 DIAGNOSIS — C3492 Malignant neoplasm of unspecified part of left bronchus or lung: Secondary | ICD-10-CM | POA: Diagnosis not present

## 2024-04-19 DIAGNOSIS — Z79899 Other long term (current) drug therapy: Secondary | ICD-10-CM | POA: Insufficient documentation

## 2024-04-19 DIAGNOSIS — F17200 Nicotine dependence, unspecified, uncomplicated: Secondary | ICD-10-CM | POA: Insufficient documentation

## 2024-04-19 DIAGNOSIS — Z720 Tobacco use: Secondary | ICD-10-CM

## 2024-04-19 MED ORDER — CYCLOBENZAPRINE HCL 5 MG PO TABS: 5.0000 mg | ORAL_TABLET | Freq: Every day | ORAL | 0 refills | Status: AC

## 2024-04-19 NOTE — Progress Notes (Signed)
 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 male was transferred to my care after his prior physician has left.   ASSESSMENT & PLAN:   Assessment & Plan: Rodney Mahoney  is a 63 y.o. male with metastatic squamous cell carcinoma  Assessment and Plan Assessment & Plan Metastatic non-small cell lung cancer of the left lung Extensive oncology history below Completed chemoimmunotherapy with carboplatin , paclitaxel , tremelimumab  and durvalumab .  Currently on maintenance Durvalumab  since December 2024.  -We reviewed the recent PET scan together.  Imaging showed significant improvement of left apical mass and lymphadenopathy. - No immunotherapy side effects reported today. - Patient is scheduled for next Durvalumab  infusion on Monday.  Continue with treatment every month. - Patient understands that he should be on maintenance treatment until disease progression or intolerance.  Return to clinic in 5 weeks for follow-up  Chemotherapy-induced peripheral neuropathy Reports tingling in hands consistent with neuropathy. Significant improvement noted with gabapentin.  -Continue gabapentin 300 mg every night.  Tobacco use Continues smoking despite lung cancer diagnosis.  - Discussed risks of continued smoking in the context of lung cancer. - Patient is not ready to quit yet  Musculoskeletal pain Musculoskeletal pain on the left side of the chest that is exaggerated when he picks his arm up.  -Recommended use of Flexeril  5 mg nightly - Will reevaluate at next visit  Alcohol  use Patient reported drinking at least 3 beers a night all through the week  - Recommended cutting down on alcohol use   The patient understands the plans discussed today and is in agreement with them.  He knows to contact our office if he develops concerns prior to his next appointment.  The total time spent in the appointment was 40 minutes for the encounter  with patient, including review of chart and various tests results, discussions about plan of care and coordination of care plan     [name], MD  [facility] [place] - A DEPT OF [facility] [address] [place] [address] Dept: [phone] Dept Fax: [phone]   No orders of the defined types were placed in this encounter.    ONCOLOGY HISTORY:   I have reviewed his chart and materials related to his cancer extensively and collaborated history with the patient. 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 Bulky mediastinal, left hilar and left lower neck lymphadenopathy worrisome for metastatic disease. Lytic lesion in the right superior sternum with soft tissue component extending into the anterior mediastinum worrisome for metastatic disease. 8 mm right upper lobe pulmonary nodule worrisome for metastatic disease. -12/01/2022: Bronchoscopy with LUL biopsy and brushing.  Pathology: Invasive moderately differentiated squamous cell carcinoma  -[facility]: PIK3CA, TP 53, MSI high not detected. -12/16/2022: Initial PET: Large LEFT upper lobe mass consistent with primary bronchogenic carcinoma. Direct invasion of the AP window and LEFT  superior hilum. Extensive metastatic adenopathy to the AP window, contralateral paratracheal  nodes as well as bilateral supraclavicular nodes. Bulky LEFT supraclavicular disease. Direct skeletal invasion of the manubrium and first RIGHT rib costamanubrial junctio. Soft tissue extension into the medial border of the overlying RIGHT pectoralis muscle. Discrete hypermetabolic RIGHT upper lobe pulmonary nodule consistent with metastatic disease. No evidence metastatic disease in the abdomen pelvis. -12/23/2022: 1 dose of concurrent chemo XRT -12/29/2022: Left lung and left bronchus radiation therapy completed -12/28/2022: MRI Brain: No evidence of intracranial metastatic disease.  -01/03/2023: [facility] NGS: TMB-high.  MSI-stable.  PIK3CA and TP 53 mutations present.  No other targetable mutations.  PD-L1 TPS 0%.  HER2 IHC 0. -01/13/2023-04/28/2023: 6 cycles of Carboplatin , paclitaxel , durvalumab  and tremelimumab  completed -05/20/2023-Current: Maintenance Durvalumab  every 28 days -03/20/2024: CT CAP: Evaluation of the chest is limited by breath motion artifact throughout. Within this limitation, similar post treatment appearance of the chest with dense fibrotic scarring and volume loss of the left upper lobe, treated mass approximately 3.1 x 2.6 cm. Interval increase in size of an irregular subpleural opacity in the peripheral superior segment left lower lobe measuring 1.8 x 1.4 cm. Interval increase in scattered, irregular ground-glass opacity in the superior segment left lower lobe. This could reflect developing radiation change or other nonspecific infection or inflammation but is modestly concerning for locally recurrent/metastatic disease. -04/05/2024: [facility]:NGS:  No targetable mutations identified. - 04/13/2024: PET scan: Striking interval improvement of the left apical mass and prior mediastinal, chest wall, and sternal involvement, with posttherapy changes at the left lung apex (SUV max 3.4, previously 27.0) and resolution of the prior hypermetabolic chest wall and destructive right sternal  components. Mild residual sclerosis and lucency at the prior sternal site without hypermetabolic activity. Indistinct band of faint density in the left mediastinum adjacent to the left pulmonary artery, possibly residual from prior tumor, with SUV max 4.5. Left AP window lymph node measuring 0.8 cm short axis with SUV max 2.8, similar to blood pool.   Current Treatment:  Maintenance Durvalumab  every 28 days  INTERVAL HISTORY:   Rodney Mahoney is a 63 year old male following for metastatic lung cancer and is currently on maintenance Durvalumab .  Patient reported some musculoskeletal pain in his left side of chest that started after lifting something heavy this morning.  He also reported drinking beer resolved through the week  Patient reports no complaints today and is significantly feeling better.  He denies fever, chills, rash, diarrhea, fatigue.  We reviewed the PET scan findings together and patient is happy with the results.   I have reviewed the past medical history, past surgical history, social history and family history with the patient and they are unchanged from previous note.  ALLERGIES:  has no known allergies.  MEDICATIONS:  Current Outpatient Medications  Medication Sig Dispense Refill   cyclobenzaprine  (FLEXERIL ) 5 MG tablet Take 1 tablet (5 mg total) by mouth at bedtime. 5 tablet 0   acetaminophen  (TYLENOL ) 500 MG tablet Take 500 mg by mouth every 6 (six) hours as needed for mild pain.     aspirin  EC 81 MG tablet Take 1 tablet (81 mg total) by mouth daily. Swallow whole. 90 tablet 3   atorvastatin  (LIPITOR ) 80 MG tablet Take 1 tablet (80 mg total) by mouth daily. 90 tablet 3   gabapentin (NEURONTIN) 300 MG capsule Take 1 capsule (300 mg total) by mouth at bedtime. 30 capsule 2   lidocaine -prilocaine  (EMLA ) cream Apply to affected area once 30 g  3   metoprolol  succinate (TOPROL  XL) 25 MG 24 hr tablet Take 0.5 tablets (12.5 mg total) by mouth daily. 45 tablet 3    nitroGLYCERIN  (NITROSTAT ) 0.4 MG SL tablet Place 1 tablet (0.4 mg total) under the tongue every 5 (five) minutes as needed. 25 tablet 0   sildenafil  (VIAGRA ) 50 MG tablet TAKE 1 TABLET BY MOUTH ONCE DAILY AS NEEDED FOR ERECTILE DYSFUNCTION 4 tablet 0   No current facility-administered medications for this visit.    REVIEW OF SYSTEMS:   Constitutional: Denies fevers, chills or abnormal weight loss Eyes: Denies blurriness of vision Ears, nose, mouth, throat, and face: Denies mucositis or sore throat Respiratory: Denies cough, dyspnea or wheezes Cardiovascular: Denies palpitation, chest discomfort or lower extremity swelling Gastrointestinal:  Denies nausea, heartburn or change in bowel habits Skin: Denies abnormal skin rashes Lymphatics: Denies new lymphadenopathy or easy bruising Neurological:Denies numbness, tingling or new weaknesses Behavioral/Psych: Mood is stable, no new changes  All other systems were reviewed with the patient and are negative.   VITALS:  Blood pressure 135/85, pulse (!) 103, temperature 97.7 F (36.5 C), temperature source Tympanic, resp. rate 19, weight 182 lb 8 oz (82.8 kg), SpO2 97%.  Wt Readings from Last 3 Encounters:  04/19/24 182 lb 8 oz (82.8 kg)  03/27/24 180 lb 1.9 oz (81.7 kg)  02/28/24 178 lb 12.7 oz (81.1 kg)    Body mass index is 29.46 kg/m.  Performance status (ECOG): 0 - Asymptomatic  PHYSICAL EXAM:   GENERAL:alert, no distress and comfortable SKIN: skin color, texture, turgor are normal, no rashes or significant lesions LYMPH:  no palpable lymphadenopathy in the cervical, axillary or inguinal LUNGS: clear to auscultation and percussion with normal breathing effort HEART: regular rate & rhythm and no murmurs and no lower extremity edema ABDOMEN:abdomen soft, non-tender and normal bowel sounds Musculoskeletal:no cyanosis of digits and no clubbing  NEURO: alert & oriented x 3 with fluent speech  LABORATORY DATA:  I have reviewed the  data as listed   Lab Results  Component Value Date   WBC 6.0 03/27/2024   NEUTROABS 3.5 03/27/2024   HGB 15.0 03/27/2024   HCT 45.2 03/27/2024   MCV 95.0 03/27/2024   PLT 223 03/27/2024      Chemistry      Component Value Date/Time   NA 138 03/27/2024 1136   NA 137 10/28/2022 1501   K 4.7 03/27/2024 1136   CL 100 03/27/2024 1136   CO2 28 03/27/2024 1136   BUN 10 03/27/2024 1136   BUN 10 10/28/2022 1501   CREATININE 0.77 03/27/2024 1136   CREATININE 0.62 01/03/2024 1300   CREATININE 0.78 07/04/2021 1606      Component Value Date/Time   CALCIUM  9.8 03/27/2024 1136   ALKPHOS 95 03/27/2024 1136   AST 30 03/27/2024 1136   AST 23 01/03/2024 1300   ALT 32 03/27/2024 1136   ALT 21 01/03/2024 1300   BILITOT 0.4 03/27/2024 1136   BILITOT 1.0 01/03/2024 1300       Latest Reference Range & Units 03/27/24 11:36  TSH 0.350 - 4.500 uIU/mL 2.830   RADIOGRAPHIC STUDIES: I have personally reviewed the radiological images as listed and agreed with the findings in the report.  NM PET Image Restag (PS) Skull Base To Thigh EXAM: PET AND CT SKULL BASE TO MID THIGH 04/13/2024 04:03:37 PM  TECHNIQUE:  RADIOPHARMACEUTICAL: 9.6 mCi F-18 FDG Uptake time 60 minutes. Glucose level 102 mg/dl. Blood pool SUV 2.6.  PET imaging was  acquired from the base of the skull to the mid thighs. Non-contrast enhanced computed tomography was obtained for attenuation correction and anatomic localization.  COMPARISON: 12/16/2022 and CT scan from 03/20/2024.  CLINICAL HISTORY:  FINDINGS:  HEAD AND NECK: Postoperative findings in the cervical spine. No metabolically active cervical lymphadenopathy.  CHEST: Striking interval improvement of the left apical mass with mediastinum chest wall, and sternal involvement shown previously with posttherapy related findings at the left lung apex at the site of the mass demonstrating maximum SUV of 3.4 (previously 27.0) and with resolution of the  previous hypermetabolic chest wall component and destructive right sternal component. There is some mild residual sclerosis and lucency at the prior site of sternal destruction but without hypermetabolic activity. The patchy airspace opacity and volume loss in the left upper lobe is likely therapy related, and there is likewise some therapy related findings in the superior segment left lower lobe. Indistinct band of faint density in the left mediastinum adjacent to the left pulmonary artery on image 56 series 202 potentially representing residual from prior tumor with max SUV 4.5. Left AP window lymph node 0.8 cm in short axis with maximum SUV 2.8 similar to blood pool. Right port a cath tip colon lower SVC. Thoracic aortic, coronary artery, and branch vessel atheromatous vascular calcifications. Mild cardiomegaly.  ABDOMEN AND PELVIS: Systemic atherosclerosis is present, including the aorta and iliac arteries. Mild sigmoid colon diverticulosis. Prostatomegaly. No metabolically active intraperitoneal mass. No metabolically active lymphadenopathy. Physiologic activity within the gastrointestinal and genitourinary systems.  BONES AND SOFT TISSUE: Degenerative hip arthropathy bilaterally. No abnormal FDG activity localizes to the bones. No metabolically active aggressive osseous lesion.  IMPRESSION: 1. Striking interval improvement of the left apical mass and prior mediastinal, chest wall, and sternal involvement, with posttherapy changes at the left lung apex (SUV max 3.4, previously 27.0) and resolution of the prior hypermetabolic chest wall and destructive right sternal components. Mild residual sclerosis and lucency at the prior sternal site without hypermetabolic activity. 2. Indistinct band of faint density in the left mediastinum adjacent to the left pulmonary artery, possibly residual from prior tumor, with SUV max 4.5. 3. Left AP window lymph node measuring 0.8 cm short axis with  SUV max 2.8, similar to blood pool. 4. Degenerative hip arthropathy bilaterally. 5. Prostatomegaly. 6. Atherosclerosis. 7. Mild cardiomegaly.  Electronically signed by: Ryan Salvage MD 04/16/2024 04:26 PM EST RP Workstation: [id number]

## 2024-04-19 NOTE — Patient Instructions (Signed)
 [facility] at [facility] Discharge Instructions   You were seen and examined today by Dr. Davonna.  She reviewed the results of your PET scan which shows an excellent response of the cancer to treatment.    We will proceed with your treatment next week.   Return as scheduled.    Thank you for choosing [facility] at [facility] to provide your oncology and hematology care.  To afford each patient quality time with our provider, please arrive at least 15 minutes before your scheduled appointment time.   If you have a lab appointment with the Cancer Center please come in thru the Main Entrance and check in at the main information desk.  You need to re-schedule your appointment should you arrive 10 or more minutes late.  We strive to give you quality time with our providers, and arriving late affects you and other patients whose appointments are after yours.  Also, if you no show three or more times for appointments you may be dismissed from the clinic at the providers discretion.     Again, thank you for choosing [facility].  Our hope is that these requests will decrease the amount of time that you wait before being seen by our physicians.       _____________________________________________________________  Should you have questions after your visit to [facility], please contact our office at [phone] and follow the prompts.  Our office hours are 8:00 a.m. and 4:30 p.m. Monday - Friday.  Please note that voicemails left after 4:00 p.m. may not be returned until the following business day.  We are closed weekends and major holidays.  You do have access to a nurse 24-7, just call the main number to the clinic [phone] and do not press any options, hold on the line and a nurse will answer the phone.    For prescription refill requests, have your pharmacy contact our office and allow 72 hours.    Due to Covid, you  will need to wear a mask upon entering the hospital. If you do not have a mask, a mask will be given to you at the Main Entrance upon arrival. For doctor visits, patients may have 1 support person age 33 or older with them. For treatment visits, patients can not have anyone with them due to social distancing guidelines and our immunocompromised population.

## 2024-04-24 ENCOUNTER — Encounter (HOSPITAL_COMMUNITY): Payer: Self-pay | Admitting: Oncology

## 2024-04-24 ENCOUNTER — Inpatient Hospital Stay: Payer: Self-pay

## 2024-04-24 VITALS — BP 123/69 | HR 94 | Temp 97.6°F | Resp 19

## 2024-04-24 DIAGNOSIS — Z5112 Encounter for antineoplastic immunotherapy: Secondary | ICD-10-CM | POA: Diagnosis not present

## 2024-04-24 DIAGNOSIS — C3492 Malignant neoplasm of unspecified part of left bronchus or lung: Secondary | ICD-10-CM

## 2024-04-24 LAB — CBC WITH DIFFERENTIAL/PLATELET
Abs Immature Granulocytes: 0.02 K/uL (ref 0.00–0.07)
Basophils Absolute: 0 K/uL (ref 0.0–0.1)
Basophils Relative: 1 %
Eosinophils Absolute: 0.1 K/uL (ref 0.0–0.5)
Eosinophils Relative: 2 %
HCT: 43.5 % (ref 39.0–52.0)
Hemoglobin: 14.4 g/dL (ref 13.0–17.0)
Immature Granulocytes: 0 %
Lymphocytes Relative: 28 %
Lymphs Abs: 2 K/uL (ref 0.7–4.0)
MCH: 31 pg (ref 26.0–34.0)
MCHC: 33.1 g/dL (ref 30.0–36.0)
MCV: 93.8 fL (ref 80.0–100.0)
Monocytes Absolute: 0.7 K/uL (ref 0.1–1.0)
Monocytes Relative: 10 %
Neutro Abs: 4.3 K/uL (ref 1.7–7.7)
Neutrophils Relative %: 59 %
Platelets: 234 K/uL (ref 150–400)
RBC: 4.64 MIL/uL (ref 4.22–5.81)
RDW: 14.9 % (ref 11.5–15.5)
WBC: 7.2 K/uL (ref 4.0–10.5)
nRBC: 0 % (ref 0.0–0.2)

## 2024-04-24 LAB — COMPREHENSIVE METABOLIC PANEL WITH GFR
ALT: 33 U/L (ref 0–44)
AST: 31 U/L (ref 15–41)
Albumin: 4.5 g/dL (ref 3.5–5.0)
Alkaline Phosphatase: 105 U/L (ref 38–126)
Anion gap: 13 (ref 5–15)
BUN: 10 mg/dL (ref 8–23)
CO2: 25 mmol/L (ref 22–32)
Calcium: 9.4 mg/dL (ref 8.9–10.3)
Chloride: 100 mmol/L (ref 98–111)
Creatinine, Ser: 0.75 mg/dL (ref 0.61–1.24)
GFR, Estimated: >60 mL/min (ref >60)
Glucose, Bld: 104 mg/dL — ABNORMAL HIGH (ref 70–99)
Potassium: 3.8 mmol/L (ref 3.5–5.1)
Sodium: 138 mmol/L (ref 135–145)
Total Bilirubin: 0.7 mg/dL (ref 0.0–1.2)
Total Protein: 7.5 g/dL (ref 6.5–8.1)

## 2024-04-24 LAB — MAGNESIUM: Magnesium: 2.1 mg/dL (ref 1.7–2.4)

## 2024-04-24 LAB — TSH: TSH: 1.78 u[IU]/mL (ref 0.350–4.500)

## 2024-04-24 MED ORDER — SODIUM CHLORIDE 0.9 % IV SOLN
1500.0000 mg | Freq: Once | INTRAVENOUS | Status: AC
  Administered 2024-04-24: 1500 mg via INTRAVENOUS
  Filled 2024-04-24: qty 30

## 2024-04-24 MED ORDER — SODIUM CHLORIDE 0.9 % IV SOLN: Freq: Once | INTRAVENOUS | Status: AC

## 2024-04-24 NOTE — Patient Instructions (Addendum)
 [facility] [place] - A DEPT OF [facility]  Discharge Instructions: Thank you for choosing [facility] to provide your oncology and hematology care.  If you have a lab appointment with the Cancer Center - please note that after April 8th, 2024, all labs will be drawn in the cancer center.  You do not have to check in or register with the main entrance as you have in the past but will complete your check-in in the cancer center.  Wear comfortable clothing and clothing appropriate for easy access to any Portacath or PICC line.   We strive to give you quality time with your provider. You may need to reschedule your appointment if you arrive late (15 or more minutes).  Arriving late affects you and other patients whose appointments are after yours.  Also, if you miss three or more appointments without notifying the office, you may be dismissed from the clinic at the provider's discretion.      For prescription refill requests, have your pharmacy contact our office and allow 72 hours for refills to be completed.    Today you received the following chemotherapy and/or immunotherapy agents Imfinzi . Durvalumab  Injection What is this medication? DURVALUMAB  (dur VAL ue mab) treats some types of cancer. It works by helping your immune system slow or stop the spread of cancer cells. It is a monoclonal antibody. This medicine may be used for other purposes; ask your health care provider or pharmacist if you have questions. COMMON BRAND NAME(S): IMFINZI  What should I tell my care team before I take this medication? They need to know if you have any of these conditions: Allogeneic stem cell transplant (uses someone else's stem cells) Autoimmune diseases, such as Crohn disease, ulcerative colitis, lupus History of chest radiation Nervous system problems, such as Guillain-Barre syndrome, myasthenia gravis Organ transplant An unusual or allergic reaction to durvalumab ,  other medications, foods, dyes, or preservatives Pregnant or trying to get pregnant Breast-feeding How should I use this medication? This medication is infused into a vein. It is given by your care team in a hospital or clinic setting. A special MedGuide will be given to you before each treatment. Be sure to read this information carefully each time. Talk to your care team about the use of this medication in children. Special care may be needed. Overdosage: If you think you have taken too much of this medicine contact a poison control center or emergency room at once. NOTE: This medicine is only for you. Do not share this medicine with others. What if I miss a dose? Keep appointments for follow-up doses. It is important not to miss your dose. Call your care team if you are unable to keep an appointment. What may interact with this medication? Interactions have not been studied. This list may not describe all possible interactions. Give your health care provider a list of all the medicines, herbs, non-prescription drugs, or dietary supplements you use. Also tell them if you smoke, drink alcohol, or use illegal drugs. Some items may interact with your medicine. What should I watch for while using this medication? Your condition will be monitored carefully while you are receiving this medication. You may need blood work while taking this medication. This medication may cause serious skin reactions. They can happen weeks to months after starting the medication. Contact your care team right away if you notice fevers or flu-like symptoms with a rash. The rash may be red or purple  and then turn into blisters or peeling of the skin. You may also notice a red rash with swelling of the face, lips, or lymph nodes in your neck or under your arms. Tell your care team right away if you have any change in your eyesight. Talk to your care team if you may be pregnant. Serious birth defects can occur if you take  this medication during pregnancy and for 3 months after the last dose. You will need a negative pregnancy test before starting this medication. Contraception is recommended while taking this medication and for 3 months after the last dose. Your care team can help you find the option that works for you. Do not breastfeed while taking this medication and for 3 months after the last dose. What side effects may I notice from receiving this medication? Side effects that you should report to your care team as soon as possible: Allergic reactions--skin rash, itching, hives, swelling of the face, lips, tongue, or throat Dry cough, shortness of breath or trouble breathing Eye pain, redness, irritation, or discharge with blurry or decreased vision Heart muscle inflammation--unusual weakness or fatigue, shortness of breath, chest pain, fast or irregular heartbeat, dizziness, swelling of the ankles, feet, or hands Hormone gland problems--headache, sensitivity to light, unusual weakness or fatigue, dizziness, fast or irregular heartbeat, increased sensitivity to cold or heat, excessive sweating, constipation, hair loss, increased thirst or amount of urine, tremors or shaking, irritability Infusion reactions--chest pain, shortness of breath or trouble breathing, feeling faint or lightheaded Kidney injury (glomerulonephritis)--decrease in the amount of urine, red or dark brown urine, foamy or bubbly urine, swelling of the ankles, hands, or feet Liver injury--right upper belly pain, loss of appetite, nausea, light-colored stool, dark yellow or brown urine, yellowing skin or eyes, unusual weakness or fatigue Pain, tingling, or numbness in the hands or feet, muscle weakness, change in vision, confusion or trouble speaking, loss of balance or coordination, trouble walking, seizures Rash, fever, and swollen lymph nodes Redness, blistering, peeling, or loosening of the skin, including inside the mouth Sudden or severe  stomach pain, bloody diarrhea, fever, nausea, vomiting Side effects that usually do not require medical attention (report these to your care team if they continue or are bothersome): Bone, joint, or muscle pain Diarrhea Fatigue Loss of appetite Nausea Skin rash This list may not describe all possible side effects. Call your doctor for medical advice about side effects. You may report side effects to FDA at 1-800-FDA-1088. Where should I keep my medication? This medication is given in a hospital or clinic. It will not be stored at home. NOTE: This sheet is a summary. It may not cover all possible information. If you have questions about this medicine, talk to your doctor, pharmacist, or health care provider.  2024 Elsevier/Gold Standard (2021-10-14 00:00:00)        To help prevent nausea and vomiting after your treatment, we encourage you to take your nausea medication as directed.  BELOW ARE SYMPTOMS THAT SHOULD BE REPORTED IMMEDIATELY: *FEVER GREATER THAN 100.4 F (38 C) OR HIGHER *CHILLS OR SWEATING *NAUSEA AND VOMITING THAT IS NOT CONTROLLED WITH YOUR NAUSEA MEDICATION *UNUSUAL SHORTNESS OF BREATH *UNUSUAL BRUISING OR BLEEDING *URINARY PROBLEMS (pain or burning when urinating, or frequent urination) *BOWEL PROBLEMS (unusual diarrhea, constipation, pain near the anus) TENDERNESS IN MOUTH AND THROAT WITH OR WITHOUT PRESENCE OF ULCERS (sore throat, sores in mouth, or a toothache) UNUSUAL RASH, SWELLING OR PAIN  UNUSUAL VAGINAL DISCHARGE OR ITCHING   Items with *  indicate a potential emergency and should be followed up as soon as possible or go to the Emergency Department if any problems should occur.  Please show the CHEMOTHERAPY ALERT CARD or IMMUNOTHERAPY ALERT CARD at check-in to the Emergency Department and triage nurse.  Should you have questions after your visit or need to cancel or reschedule your appointment, please contact [facility] [place] - A DEPT OF [facility] [phone]  and follow the prompts.  Office hours are 8:00 a.m. to 4:30 p.m. Monday - Friday. Please note that voicemails left after 4:00 p.m. may not be returned until the following business day.  We are closed weekends and major holidays. You have access to a nurse at all times for urgent questions. Please call the main number to the clinic [phone] and follow the prompts.  For any non-urgent questions, you may also contact your provider using MyChart. We now offer e-Visits for anyone 61 and older to request care online for non-urgent symptoms. For details visit [email or web address].   Also download the MyChart app! Go to the app store, search MyChart, open the app, select [facility], and log in with your MyChart username and password.  Durvalumab  Injection What is this medication? DURVALUMAB  (dur VAL ue mab) treats some types of cancer. It works by helping your immune system slow or stop the spread of cancer cells. It is a monoclonal antibody. This medicine may be used for other purposes; ask your health care provider or pharmacist if you have questions. COMMON BRAND NAME(S): IMFINZI  What should I tell my care team before I take this medication? They need to know if you have any of these conditions: Allogeneic stem cell transplant (uses someone else's stem cells) Autoimmune diseases, such as Crohn disease, ulcerative colitis, lupus History of chest radiation Nervous system problems, such as Guillain-Barre syndrome, myasthenia gravis Organ transplant An unusual or allergic reaction to durvalumab , other medications, foods, dyes, or preservatives Pregnant or trying to get pregnant Breast-feeding How should I use this medication? This medication is infused into a vein. It is given by your care team in a hospital or clinic setting. A special MedGuide will be given to you before each treatment. Be sure to read this information carefully each time. Talk to your care  team about the use of this medication in children. Special care may be needed. Overdosage: If you think you have taken too much of this medicine contact a poison control center or emergency room at once. NOTE: This medicine is only for you. Do not share this medicine with others. What if I miss a dose? Keep appointments for follow-up doses. It is important not to miss your dose. Call your care team if you are unable to keep an appointment. What may interact with this medication? Interactions have not been studied. This list may not describe all possible interactions. Give your health care provider a list of all the medicines, herbs, non-prescription drugs, or dietary supplements you use. Also tell them if you smoke, drink alcohol, or use illegal drugs. Some items may interact with your medicine. What should I watch for while using this medication? Your condition will be monitored carefully while you are receiving this medication. You may need blood work while taking this medication. This medication may cause serious skin reactions. They can happen weeks to months after starting the medication. Contact your care team right away if you notice fevers or flu-like symptoms with a rash. The rash may be red  or purple and then turn into blisters or peeling of the skin. You may also notice a red rash with swelling of the face, lips, or lymph nodes in your neck or under your arms. Tell your care team right away if you have any change in your eyesight. Talk to your care team if you may be pregnant. Serious birth defects can occur if you take this medication during pregnancy and for 3 months after the last dose. You will need a negative pregnancy test before starting this medication. Contraception is recommended while taking this medication and for 3 months after the last dose. Your care team can help you find the option that works for you. Do not breastfeed while taking this medication and for 3 months after the  last dose. What side effects may I notice from receiving this medication? Side effects that you should report to your care team as soon as possible: Allergic reactions--skin rash, itching, hives, swelling of the face, lips, tongue, or throat Dry cough, shortness of breath or trouble breathing Eye pain, redness, irritation, or discharge with blurry or decreased vision Heart muscle inflammation--unusual weakness or fatigue, shortness of breath, chest pain, fast or irregular heartbeat, dizziness, swelling of the ankles, feet, or hands Hormone gland problems--headache, sensitivity to light, unusual weakness or fatigue, dizziness, fast or irregular heartbeat, increased sensitivity to cold or heat, excessive sweating, constipation, hair loss, increased thirst or amount of urine, tremors or shaking, irritability Infusion reactions--chest pain, shortness of breath or trouble breathing, feeling faint or lightheaded Kidney injury (glomerulonephritis)--decrease in the amount of urine, red or dark brown urine, foamy or bubbly urine, swelling of the ankles, hands, or feet Liver injury--right upper belly pain, loss of appetite, nausea, light-colored stool, dark yellow or brown urine, yellowing skin or eyes, unusual weakness or fatigue Pain, tingling, or numbness in the hands or feet, muscle weakness, change in vision, confusion or trouble speaking, loss of balance or coordination, trouble walking, seizures Rash, fever, and swollen lymph nodes Redness, blistering, peeling, or loosening of the skin, including inside the mouth Sudden or severe stomach pain, bloody diarrhea, fever, nausea, vomiting Side effects that usually do not require medical attention (report these to your care team if they continue or are bothersome): Bone, joint, or muscle pain Diarrhea Fatigue Loss of appetite Nausea Skin rash This list may not describe all possible side effects. Call your doctor for medical advice about side effects.  You may report side effects to FDA at 1-800-FDA-1088. Where should I keep my medication? This medication is given in a hospital or clinic. It will not be stored at home. NOTE: This sheet is a summary. It may not cover all possible information. If you have questions about this medicine, talk to your doctor, pharmacist, or health care provider.  2024 Elsevier/Gold Standard (2021-10-14 00:00:00)

## 2024-04-24 NOTE — Progress Notes (Signed)
 Patient presents today for Imfinzi  infusion. Labs and vital signs within parameters for treatment.  Treatment given today per MD orders. Tolerated infusion without adverse affects. Vital signs stable. No complaints at this time. Discharged from clinic ambulatory in stable condition. Alert and oriented x 3. F/U with [facility] as scheduled.

## 2024-05-17 ENCOUNTER — Inpatient Hospital Stay: Attending: Hematology | Admitting: Oncology

## 2024-05-17 ENCOUNTER — Inpatient Hospital Stay

## 2024-05-17 DIAGNOSIS — T451X5A Adverse effect of antineoplastic and immunosuppressive drugs, initial encounter: Secondary | ICD-10-CM | POA: Diagnosis not present

## 2024-05-17 DIAGNOSIS — Z720 Tobacco use: Secondary | ICD-10-CM | POA: Diagnosis not present

## 2024-05-17 DIAGNOSIS — M7918 Myalgia, other site: Secondary | ICD-10-CM

## 2024-05-17 DIAGNOSIS — Z5112 Encounter for antineoplastic immunotherapy: Secondary | ICD-10-CM | POA: Diagnosis present

## 2024-05-17 DIAGNOSIS — Z79620 Long term (current) use of immunosuppressive biologic: Secondary | ICD-10-CM | POA: Diagnosis not present

## 2024-05-17 DIAGNOSIS — C3492 Malignant neoplasm of unspecified part of left bronchus or lung: Secondary | ICD-10-CM | POA: Insufficient documentation

## 2024-05-17 DIAGNOSIS — F1090 Alcohol use, unspecified, uncomplicated: Secondary | ICD-10-CM | POA: Diagnosis not present

## 2024-05-17 DIAGNOSIS — G620 Drug-induced polyneuropathy: Secondary | ICD-10-CM | POA: Insufficient documentation

## 2024-05-17 DIAGNOSIS — F17210 Nicotine dependence, cigarettes, uncomplicated: Secondary | ICD-10-CM | POA: Diagnosis not present

## 2024-05-17 LAB — CBC WITH DIFFERENTIAL/PLATELET
Abs Immature Granulocytes: 0.01 K/uL (ref 0.00–0.07)
Basophils Absolute: 0 K/uL (ref 0.0–0.1)
Basophils Relative: 0 %
Eosinophils Absolute: 0.1 K/uL (ref 0.0–0.5)
Eosinophils Relative: 1 %
HCT: 43.5 % (ref 39.0–52.0)
Hemoglobin: 14.5 g/dL (ref 13.0–17.0)
Immature Granulocytes: 0 %
Lymphocytes Relative: 18 %
Lymphs Abs: 1.2 K/uL (ref 0.7–4.0)
MCH: 31.2 pg (ref 26.0–34.0)
MCHC: 33.3 g/dL (ref 30.0–36.0)
MCV: 93.5 fL (ref 80.0–100.0)
Monocytes Absolute: 0.8 K/uL (ref 0.1–1.0)
Monocytes Relative: 11 %
Neutro Abs: 4.7 K/uL (ref 1.7–7.7)
Neutrophils Relative %: 70 %
Platelets: 228 K/uL (ref 150–400)
RBC: 4.65 MIL/uL (ref 4.22–5.81)
RDW: 14.7 % (ref 11.5–15.5)
WBC: 6.8 K/uL (ref 4.0–10.5)
nRBC: 0 % (ref 0.0–0.2)

## 2024-05-17 LAB — TSH: TSH: 2.95 u[IU]/mL (ref 0.350–4.500)

## 2024-05-17 LAB — COMPREHENSIVE METABOLIC PANEL WITH GFR
ALT: 30 U/L (ref 0–44)
AST: 29 U/L (ref 15–41)
Albumin: 4.2 g/dL (ref 3.5–5.0)
Alkaline Phosphatase: 105 U/L (ref 38–126)
Anion gap: 14 (ref 5–15)
BUN: 11 mg/dL (ref 8–23)
CO2: 23 mmol/L (ref 22–32)
Calcium: 9.2 mg/dL (ref 8.9–10.3)
Chloride: 100 mmol/L (ref 98–111)
Creatinine, Ser: 0.7 mg/dL (ref 0.61–1.24)
GFR, Estimated: >60 mL/min (ref >60)
Glucose, Bld: 98 mg/dL (ref 70–99)
Potassium: 3.9 mmol/L (ref 3.5–5.1)
Sodium: 137 mmol/L (ref 135–145)
Total Bilirubin: 0.7 mg/dL (ref 0.0–1.2)
Total Protein: 7.3 g/dL (ref 6.5–8.1)

## 2024-05-17 LAB — MAGNESIUM: Magnesium: 2.3 mg/dL (ref 1.7–2.4)

## 2024-05-17 NOTE — Patient Instructions (Signed)
 [facility] at [facility] Discharge Instructions   You were seen and examined today by Dr. Davonna.  She reviewed the results of your lab work which are normal/stable.   We will proceed with your treatment Monday, 12/8. This will be your final treatment.    Return as scheduled.    Thank you for choosing  Cancer Center at [facility] to provide your oncology and hematology care.  To afford each patient quality time with our provider, please arrive at least 15 minutes before your scheduled appointment time.   If you have a lab appointment with the Cancer Center please come in thru the Main Entrance and check in at the main information desk.  You need to re-schedule your appointment should you arrive 10 or more minutes late.  We strive to give you quality time with our providers, and arriving late affects you and other patients whose appointments are after yours.  Also, if you no show three or more times for appointments you may be dismissed from the clinic at the providers discretion.     Again, thank you for choosing [facility].  Our hope is that these requests will decrease the amount of time that you wait before being seen by our physicians.       _____________________________________________________________  Should you have questions after your visit to [facility], please contact our office at [phone] and follow the prompts.  Our office hours are 8:00 a.m. and 4:30 p.m. Monday - Friday.  Please note that voicemails left after 4:00 p.m. may not be returned until the following business day.  We are closed weekends and major holidays.  You do have access to a nurse 24-7, just call the main number to the clinic [phone] and do not press any options, hold on the line and a nurse will answer the phone.    For prescription refill requests, have your pharmacy contact our office and allow 72 hours.    Due to Covid, you  will need to wear a mask upon entering the hospital. If you do not have a mask, a mask will be given to you at the Main Entrance upon arrival. For doctor visits, patients may have 1 support person age 63 or older with them. For treatment visits, patients can not have anyone with them due to social distancing guidelines and our immunocompromised population.

## 2024-05-17 NOTE — Progress Notes (Signed)
 Patients port flushed without difficulty.  Good blood return noted with no bruising or swelling noted at site.  Labs drawn per orders.  VSS with discharge and left in satisfactory condition with no s/s of distress noted. All follow ups as scheduled.       Kierre Deines

## 2024-05-17 NOTE — Progress Notes (Signed)
 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 male was transferred to my care after his prior physician has left.   ASSESSMENT & PLAN:   Assessment & Plan: Rodney Mahoney  is a 63 y.o. male with metastatic squamous cell carcinoma  Assessment and Plan Assessment & Plan Metastatic non-small cell lung cancer of the left lung Extensive oncology history below Completed chemoimmunotherapy with carboplatin , paclitaxel , tremelimumab  and durvalumab .  Currently on maintenance Durvalumab  since December 2024.   - No immunotherapy side effects reported today. - Labs reviewed today: CMP: WNL., CBC:WNL, TSH: 2.950 - Physical exam stable today. Patient scheduled for treatment on 05/22/2024 - Patient understands that he should be on maintenance treatment until disease progression or intolerance. - Repeat PET scan in 3 months. Due 06/2024  Return to clinic in 5 weeks for follow-up with a PET scan  Chemotherapy-induced peripheral neuropathy Reports tingling in hands consistent with neuropathy. Significant improvement noted with gabapentin .  -Continue gabapentin  300 mg every night.  Tobacco use Continues smoking despite lung cancer diagnosis.  - Discussed risks of continued smoking in the context of lung cancer. - Patient is not ready to quit yet  Musculoskeletal pain Musculoskeletal pain on the left side of the chest that is exaggerated when he picks his arm up.  -Recommended use of Flexeril  5 mg nightly - Will reevaluate at next visit  Alcohol use Patient reported drinking at least  3 beers a night all through the week  - Recommended cutting down on alcohol use  Chest pain under evaluation Intermittent chest pain persists despite flecainide, unrelated to cancer or chemotherapy, muscle relaxers ineffective. Likely musculoskeletal. No SOB reported.   - Referred to primary care for further evaluation of chest pain.   The patient understands the plans discussed today and is in agreement with them.  He knows to contact our office if he develops concerns prior to his next appointment.  The total time spent in the appointment was 16 minutes for the encounter with patient, including review of chart and various tests results, discussions about plan of care and coordination of care plan      [name], MD  [facility] [place] - A DEPT OF [facility] [address] [place] [address] Dept: [phone] Dept Fax: [phone]   Orders Placed This Encounter  Procedures   NM PET Image Restage (PS) Skull Base to Thigh (F-18 FDG)    Standing Status:   Future    Expected Date:   06/17/2024    Expiration Date:   05/17/2025    If indicated for the ordered procedure, I authorize the administration of a radiopharmaceutical per Radiology protocol:   Yes    Preferred imaging location?:   [facility]     ONCOLOGY HISTORY:   I have reviewed his chart and materials related to his cancer extensively and collaborated history with the patient. 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 Bulky mediastinal, left hilar and left lower neck lymphadenopathy worrisome for metastatic disease. Lytic lesion in the right superior sternum with soft tissue component extending into the anterior  mediastinum worrisome for metastatic disease. 8 mm right upper lobe pulmonary nodule worrisome for metastatic disease. -12/01/2022: Bronchoscopy with LUL biopsy and brushing.  Pathology: Invasive moderately differentiated squamous cell carcinoma  -[facility]: PIK3CA, TP 53, MSI high not detected. -12/16/2022: Initial PET: Large LEFT upper lobe mass consistent with primary bronchogenic carcinoma. Direct invasion of the AP window and LEFT superior hilum. Extensive metastatic adenopathy to the AP window, contralateral paratracheal nodes as well as bilateral supraclavicular nodes. Bulky LEFT supraclavicular disease. Direct skeletal invasion of the manubrium and first RIGHT rib costamanubrial junction. Soft tissue extension into the medial border of the overlying RIGHT pectoralis muscle. Discrete hypermetabolic RIGHT upper lobe pulmonary nodule consistent with metastatic disease. No evidence metastatic disease in the abdomen pelvis. -12/23/2022: 1 dose of concurrent chemo XRT -12/29/2022: Left lung and left bronchus radiation therapy completed -12/28/2022: MRI Brain: No evidence of intracranial metastatic disease.  -01/03/2023: [facility] NGS: TMB-high.  MSI-stable.  PIK3CA and TP 53 mutations present.  No other targetable mutations.  PD-L1 TPS 0%.  HER2 IHC 0. -01/13/2023-04/28/2023: 6 cycles of Carboplatin , paclitaxel , durvalumab  and tremelimumab  completed -05/20/2023-Current: Maintenance Durvalumab  every 28 days -03/20/2024: CT CAP: Evaluation of the chest is limited by breath motion artifact throughout. Within this limitation, similar post treatment appearance of the chest with dense fibrotic scarring and volume loss of the left upper lobe, treated mass approximately 3.1 x 2.6 cm. Interval increase in size of an irregular subpleural opacity in the peripheral superior segment left lower lobe measuring 1.8 x 1.4 cm. Interval increase in scattered, irregular ground-glass opacity in the superior segment left lower  lobe. This could reflect developing radiation change or other nonspecific infection or inflammation but is modestly concerning for locally recurrent/metastatic disease. -04/05/2024: [facility]:NGS:  No targetable mutations identified. - 04/13/2024: PET scan: Striking interval improvement of the left apical mass and prior mediastinal, chest wall, and sternal involvement, with posttherapy changes at the left lung apex (SUV max 3.4, previously 27.0) and resolution of the prior hypermetabolic chest wall and destructive right sternal components. Mild residual sclerosis and lucency at the prior sternal site without hypermetabolic activity. Indistinct band of faint density in the left mediastinum adjacent to the left pulmonary artery, possibly residual from prior tumor, with SUV max 4.5. Left AP window lymph node measuring 0.8 cm short axis with SUV max 2.8, similar to blood pool.   Current Treatment:  Maintenance Durvalumab  every 28 days  INTERVAL HISTORY:   Discussed the use of AI scribe software for clinical note transcription with the patient, who gave verbal consent to proceed.  History of Present Illness Rodney Mahoney is a 63 year old male undergoing immunotherapy for metastatic lung carcinoma presented today for follow up.   He began chemotherapy in December 2024 and has been receiving treatment every four weeks.  He experiences numbness and tingling, which he describes as 'not too bad'. These symptoms are being managed with gabapentin  300 mg.  He continues to have chest pain, which was present last night. He has been taking flecainide, but it has not been effective in alleviating the chest pain. No SOB reported.   He has persistent muscle pain despite being on muscle relaxers since 2014. He has been prescribed Flexeril , but the muscle pain persists.  He reports difficulty in scheduling and remembering appointments with his primary care doctor, leading to missed appointments.  He  drinks alcohol and recently drank too much, which he believes contributed to dehydration and muscle issues.  He experiences nasal congestion, particularly when lying down.   I have reviewed the past medical history, past surgical history, social history and family history with the patient and they are unchanged from previous note.  ALLERGIES:  has no known allergies.  MEDICATIONS:  Current Outpatient Medications  Medication Sig Dispense Refill   acetaminophen  (TYLENOL ) 500 MG tablet Take 500 mg by mouth every 6 (six) hours as needed for mild pain.     aspirin  EC 81 MG tablet Take 1 tablet (81 mg total) by mouth daily. Swallow whole. 90 tablet 3   atorvastatin  (LIPITOR ) 80 MG tablet Take 1 tablet (80 mg total) by mouth daily. 90 tablet 3   cyclobenzaprine  (FLEXERIL ) 5 MG tablet Take 1 tablet (5 mg total) by mouth at bedtime. 5 tablet 0   gabapentin  (NEURONTIN ) 300 MG capsule Take 1 capsule (300 mg total) by mouth at bedtime. 30 capsule 2   lidocaine -prilocaine  (EMLA ) cream Apply to affected area once 30 g 3   metoprolol  succinate (TOPROL  XL) 25 MG 24 hr tablet Take 0.5 tablets (12.5 mg total) by mouth daily. 45 tablet 3   nitroGLYCERIN  (NITROSTAT ) 0.4 MG SL tablet Place 1 tablet (0.4 mg total) under the tongue every 5 (five) minutes as needed. 25 tablet 0   sildenafil  (VIAGRA ) 50 MG tablet TAKE 1 TABLET BY MOUTH ONCE DAILY AS NEEDED FOR ERECTILE DYSFUNCTION 4 tablet 0   No current facility-administered medications for this visit.    VITALS:  There were no vitals taken for this visit.  Wt Readings from Last 3 Encounters:  05/22/24 182 lb (82.6 kg)  05/17/24 181 lb 10.5 oz (82.4 kg)  04/24/24 180 lb 5.4 oz (81.8 kg)    There is no height or weight on file to calculate BMI.  Performance status (ECOG): 0 - Asymptomatic  PHYSICAL EXAM:   GENERAL:alert, no distress and comfortable SKIN: skin color, texture, turgor are normal, no rashes or significant lesions LYMPH:  no palpable  lymphadenopathy in the cervical, axillary or inguinal LUNGS: clear to auscultation and percussion with normal breathing effort HEART: regular rate & rhythm and no murmurs and no lower extremity edema ABDOMEN:abdomen soft, non-tender and normal bowel sounds Musculoskeletal:no cyanosis of digits and no clubbing  NEURO: alert & oriented x 3 with fluent speech  LABORATORY DATA:  I have reviewed the data as listed   Lab Results  Component Value Date   WBC 6.8 05/17/2024   NEUTROABS 4.7 05/17/2024   HGB 14.5 05/17/2024   HCT 43.5 05/17/2024   MCV 93.5 05/17/2024   PLT 228 05/17/2024      Chemistry      Component Value Date/Time   NA 137 05/17/2024 1301   NA 137 10/28/2022 1501   K 3.9 05/17/2024 1301   CL 100 05/17/2024 1301   CO2 23 05/17/2024 1301   BUN 11 05/17/2024 1301   BUN 10 10/28/2022 1501   CREATININE 0.70 05/17/2024 1301   CREATININE 0.62 01/03/2024 1300   CREATININE 0.78 07/04/2021 1606      Component Value Date/Time   CALCIUM  9.2 05/17/2024 1301   ALKPHOS 105 05/17/2024 1301   AST 29 05/17/2024 1301   AST 23 01/03/2024 1300   ALT 30 05/17/2024 1301   ALT 21 01/03/2024 1300   BILITOT  0.7 05/17/2024 1301   BILITOT 1.0 01/03/2024 1300       Latest Reference Range & Units 05/17/24 13:01  TSH 0.350 - 4.500 uIU/mL 2.950   RADIOGRAPHIC STUDIES: I have personally reviewed the radiological images as listed and agreed with the findings in the report.    "

## 2024-05-18 ENCOUNTER — Other Ambulatory Visit: Payer: Self-pay

## 2024-05-22 ENCOUNTER — Inpatient Hospital Stay: Payer: Self-pay

## 2024-05-22 ENCOUNTER — Encounter (HOSPITAL_COMMUNITY): Payer: Self-pay | Admitting: Oncology

## 2024-05-22 VITALS — BP 134/87 | HR 94 | Temp 98.2°F | Resp 19 | Wt 182.0 lb

## 2024-05-22 DIAGNOSIS — C3492 Malignant neoplasm of unspecified part of left bronchus or lung: Secondary | ICD-10-CM

## 2024-05-22 DIAGNOSIS — Z5112 Encounter for antineoplastic immunotherapy: Secondary | ICD-10-CM | POA: Diagnosis not present

## 2024-05-22 MED ORDER — SODIUM CHLORIDE 0.9 % IV SOLN: Freq: Once | INTRAVENOUS | Status: AC

## 2024-05-22 MED ORDER — SODIUM CHLORIDE 0.9 % IV SOLN
1500.0000 mg | Freq: Once | INTRAVENOUS | Status: AC
  Administered 2024-05-22: 1500 mg via INTRAVENOUS
  Filled 2024-05-22: qty 30

## 2024-05-22 NOTE — Patient Instructions (Signed)
 [facility] [place] - A DEPT OF [facility]  Discharge Instructions: Thank you for choosing [facility] to provide your oncology and hematology care.  If you have a lab appointment with the Cancer Center - please note that after April 8th, 2024, all labs will be drawn in the cancer center.  You do not have to check in or register with the main entrance as you have in the past but will complete your check-in in the cancer center.  Wear comfortable clothing and clothing appropriate for easy access to any Portacath or PICC line.   We strive to give you quality time with your provider. You may need to reschedule your appointment if you arrive late (15 or more minutes).  Arriving late affects you and other patients whose appointments are after yours.  Also, if you miss three or more appointments without notifying the office, you may be dismissed from the clinic at the provider's discretion.      For prescription refill requests, have your pharmacy contact our office and allow 72 hours for refills to be completed.    Today you received the following chemotherapy and/or immunotherapy agents Imfinzi   To help prevent nausea and vomiting after your treatment, we encourage you to take your nausea medication as directed.  Durvalumab Injection What is this medication? DURVALUMAB (dur VAL ue mab) treats some types of cancer. It works by helping your immune system slow or stop the spread of cancer cells. It is a monoclonal antibody. This medicine may be used for other purposes; ask your health care provider or pharmacist if you have questions. COMMON BRAND NAME(S): IMFINZI What should I tell my care team before I take this medication? They need to know if you have any of these conditions: Allogeneic stem cell transplant (uses someone else's stem cells) Autoimmune diseases, such as Crohn disease, ulcerative colitis, lupus History of chest radiation Nervous system  problems, such as Guillain-Barre syndrome, myasthenia gravis Organ transplant An unusual or allergic reaction to durvalumab, other medications, foods, dyes, or preservatives Pregnant or trying to get pregnant Breast-feeding How should I use this medication? This medication is infused into a vein. It is given by your care team in a hospital or clinic setting. A special MedGuide will be given to you before each treatment. Be sure to read this information carefully each time. Talk to your care team about the use of this medication in children. Special care may be needed. Overdosage: If you think you have taken too much of this medicine contact a poison control center or emergency room at once. NOTE: This medicine is only for you. Do not share this medicine with others. What if I miss a dose? Keep appointments for follow-up doses. It is important not to miss your dose. Call your care team if you are unable to keep an appointment. What may interact with this medication? Interactions have not been studied. This list may not describe all possible interactions. Give your health care provider a list of all the medicines, herbs, non-prescription drugs, or dietary supplements you use. Also tell them if you smoke, drink alcohol, or use illegal drugs. Some items may interact with your medicine. What should I watch for while using this medication? Your condition will be monitored carefully while you are receiving this medication. You may need blood work while taking this medication. This medication may cause serious skin reactions. They can happen weeks to months after starting the medication. Contact  your care team right away if you notice fevers or flu-like symptoms with a rash. The rash may be red or purple and then turn into blisters or peeling of the skin. You may also notice a red rash with swelling of the face, lips, or lymph nodes in your neck or under your arms. Tell your care team right away if you  have any change in your eyesight. Talk to your care team if you may be pregnant. Serious birth defects can occur if you take this medication during pregnancy and for 3 months after the last dose. You will need a negative pregnancy test before starting this medication. Contraception is recommended while taking this medication and for 3 months after the last dose. Your care team can help you find the option that works for you. Do not breastfeed while taking this medication and for 3 months after the last dose. What side effects may I notice from receiving this medication? Side effects that you should report to your care team as soon as possible: Allergic reactions--skin rash, itching, hives, swelling of the face, lips, tongue, or throat Dry cough, shortness of breath or trouble breathing Eye pain, redness, irritation, or discharge with blurry or decreased vision Heart muscle inflammation--unusual weakness or fatigue, shortness of breath, chest pain, fast or irregular heartbeat, dizziness, swelling of the ankles, feet, or hands Hormone gland problems--headache, sensitivity to light, unusual weakness or fatigue, dizziness, fast or irregular heartbeat, increased sensitivity to cold or heat, excessive sweating, constipation, hair loss, increased thirst or amount of urine, tremors or shaking, irritability Infusion reactions--chest pain, shortness of breath or trouble breathing, feeling faint or lightheaded Kidney injury (glomerulonephritis)--decrease in the amount of urine, red or dark brown urine, foamy or bubbly urine, swelling of the ankles, hands, or feet Liver injury--right upper belly pain, loss of appetite, nausea, light-colored stool, dark yellow or brown urine, yellowing skin or eyes, unusual weakness or fatigue Pain, tingling, or numbness in the hands or feet, muscle weakness, change in vision, confusion or trouble speaking, loss of balance or coordination, trouble walking, seizures Rash, fever, and  swollen lymph nodes Redness, blistering, peeling, or loosening of the skin, including inside the mouth Sudden or severe stomach pain, bloody diarrhea, fever, nausea, vomiting Side effects that usually do not require medical attention (report these to your care team if they continue or are bothersome): Bone, joint, or muscle pain Diarrhea Fatigue Loss of appetite Nausea Skin rash This list may not describe all possible side effects. Call your doctor for medical advice about side effects. You may report side effects to FDA at 1-800-FDA-1088. Where should I keep my medication? This medication is given in a hospital or clinic. It will not be stored at home. NOTE: This sheet is a summary. It may not cover all possible information. If you have questions about this medicine, talk to your doctor, pharmacist, or health care provider.  2024 Elsevier/Gold Standard (2021-10-14 00:00:00)   BELOW ARE SYMPTOMS THAT SHOULD BE REPORTED IMMEDIATELY: *FEVER GREATER THAN 100.4 F (38 C) OR HIGHER *CHILLS OR SWEATING *NAUSEA AND VOMITING THAT IS NOT CONTROLLED WITH YOUR NAUSEA MEDICATION *UNUSUAL SHORTNESS OF BREATH *UNUSUAL BRUISING OR BLEEDING *URINARY PROBLEMS (pain or burning when urinating, or frequent urination) *BOWEL PROBLEMS (unusual diarrhea, constipation, pain near the anus) TENDERNESS IN MOUTH AND THROAT WITH OR WITHOUT PRESENCE OF ULCERS (sore throat, sores in mouth, or a toothache) UNUSUAL RASH, SWELLING OR PAIN  UNUSUAL VAGINAL DISCHARGE OR ITCHING   Items with * indicate a  potential emergency and should be followed up as soon as possible or go to the Emergency Department if any problems should occur.  Please show the CHEMOTHERAPY ALERT CARD or IMMUNOTHERAPY ALERT CARD at check-in to the Emergency Department and triage nurse.  Should you have questions after your visit or need to cancel or reschedule your appointment, please contact [facility] [place] - A DEPT OF [facility] [phone]  and follow the prompts.  Office hours are 8:00 a.m. to 4:30 p.m. Monday - Friday. Please note that voicemails left after 4:00 p.m. may not be returned until the following business day.  We are closed weekends and major holidays. You have access to a nurse at all times for urgent questions. Please call the main number to the clinic [phone] and follow the prompts.  For any non-urgent questions, you may also contact your provider using MyChart. We now offer e-Visits for anyone 52 and older to request care online for non-urgent symptoms. For details visit [email or web address].   Also download the MyChart app! Go to the app store, search "MyChart", open the app, select [place], and log in with your MyChart username and password.

## 2024-05-22 NOTE — Progress Notes (Signed)
 Ok to proceed with HR 110  Dr Hillery Molt, PharmD

## 2024-05-22 NOTE — Progress Notes (Signed)
 Patient presents today for chemotherapy Imfinzi   infusion.  Patient is in satisfactory condition with no new complaints voiced.  Vital signs are stable.  Labs drawn 05/17/2024  reviewed and all labs are within treatment parameters. Patient's HR noted to be 110 Johnston Munster PA-C made aware.  We will proceed with treatment per MD orders.    Treatment given today per MD orders. Tolerated infusion without adverse affects. Vital signs stable. No complaints at this time. Discharged from clinic ambulatory in stable condition. Alert and oriented x 3. F/U with [facility] as scheduled.

## 2024-06-02 ENCOUNTER — Ambulatory Visit

## 2024-06-14 ENCOUNTER — Encounter (HOSPITAL_COMMUNITY): Payer: Self-pay | Admitting: Oncology

## 2024-06-19 ENCOUNTER — Encounter (HOSPITAL_COMMUNITY): Payer: Self-pay | Admitting: Oncology

## 2024-06-22 ENCOUNTER — Ambulatory Visit (HOSPITAL_COMMUNITY)
Admission: RE | Admit: 2024-06-22 | Discharge: 2024-06-22 | Disposition: A | Payer: Self-pay | Source: Ambulatory Visit | Attending: Oncology

## 2024-06-22 DIAGNOSIS — C3492 Malignant neoplasm of unspecified part of left bronchus or lung: Secondary | ICD-10-CM | POA: Insufficient documentation

## 2024-06-22 MED ORDER — FLUDEOXYGLUCOSE F - 18 (FDG) INJECTION
9.6300 | Freq: Once | INTRAVENOUS | Status: AC | PRN
  Administered 2024-06-22: 9.63 via INTRAVENOUS

## 2024-06-23 ENCOUNTER — Other Ambulatory Visit: Payer: Self-pay | Admitting: Oncology

## 2024-06-23 DIAGNOSIS — C3492 Malignant neoplasm of unspecified part of left bronchus or lung: Secondary | ICD-10-CM

## 2024-06-24 ENCOUNTER — Other Ambulatory Visit: Payer: Self-pay

## 2024-06-25 ENCOUNTER — Other Ambulatory Visit: Payer: Self-pay

## 2024-06-28 NOTE — Progress Notes (Signed)
 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 male with metastatic squamous cell carcinoma  Assessment and Plan  Metastatic non-small cell lung cancer of the left lung Extensive oncology history below Completed chemoimmunotherapy with carboplatin , paclitaxel , tremelimumab  and durvalumab .  Currently on maintenance Durvalumab  since December 2024.   - We reviewed PET scan findings together.  Patient has progression of disease in bilateral lungs.  - Discussed docetaxel plus ramucirumab as a next treatment option. Docetaxel plus ramucirumab, used as second-line therapy in advanced non-small cell lung cancer (NSCLC), commonly causes neutropenia, fatigue, stomatitis, hypertension, and diarrhea; febrile neutropenia and infections are notable serious toxicities. Additional side effects include proteinuria, bleeding, and infusion-related reactions due to the anti-angiogenic effects of ramucirumab. In the REVEL trial, this combination improved median overall survival (OS) to 10.5 months vs. 9.1 months with docetaxel alone, showing a modest but statistically significant survival benefit.  - Will send guardant 360 today - Labs reviewed today: CMP: WNL., CBC:WNL, TSH:3.660 - Physical exam stable today. Will schedule for treatment next week. - Patient understands that he should be on maintenance treatment until disease progression or intolerance. - Repeat PET scan in 3 months. Due 09/2024  Return to clinic in 5 weeks to assess for  tolerance  Chemotherapy-induced peripheral neuropathy Reports tingling in hands consistent with neuropathy. Significant improvement noted with gabapentin .  -Continue gabapentin  300 mg every night.  Tobacco use Continues smoking despite lung cancer diagnosis.  - Discussed risks of continued smoking in the context of lung cancer. - Patient is not ready to quit yet  Musculoskeletal pain Musculoskeletal pain on the left side of the chest that is exaggerated when he picks his arm up. Improved with Flexeril   Alcohol use Patient reported drinking at least 3 beers a night all through the week  - Recommended cutting down on alcohol use   The patient understands the plans discussed today and is in agreement with them.  He knows to contact our office if he develops concerns prior to his next appointment.  The total time spent in the appointment was 40 minutes for the encounter with patient, including review of chart and various tests results, discussions about plan of care and coordination of care plan   I,Helena R Teague,acting as a scribe for [name], MD.,have documented all relevant documentation on the behalf of [name], MD,as directed by  [name], MD while in the presence of [name], MD.  I, [name] MD, have reviewed the above documentation for accuracy and completeness, and I agree with the above.        [name], MD  [facility] [place] - A DEPT OF [facility] [address] [place] [address] Dept: [phone] Dept Fax: [phone]   Orders Placed This Encounter  Procedures   GUARDANT 360    Standing Status:   Future    Number of Occurrences:   1    Expected Date:   06/29/2024    Expiration Date:  06/29/2025    Release to patient:   Immediate [1]    Remote health to draw?:   No     ONCOLOGY HISTORY:   I have reviewed his chart and materials related to his  cancer extensively and collaborated history with the patient. 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 Bulky mediastinal, left hilar and left lower neck lymphadenopathy worrisome for metastatic disease. Lytic lesion in the right superior sternum with soft tissue component extending into the anterior mediastinum worrisome for metastatic disease. 8 mm right upper lobe pulmonary nodule worrisome for metastatic disease. -12/01/2022: Bronchoscopy with LUL biopsy and brushing.  Pathology: Invasive moderately differentiated squamous cell carcinoma  -Guardant360: PIK3CA, TP 53, MSI high not detected. -12/16/2022: Initial PET: Large LEFT upper lobe mass consistent with primary bronchogenic carcinoma. Direct invasion of the AP window and LEFT superior hilum. Extensive metastatic adenopathy to the AP window, contralateral paratracheal nodes as well as bilateral supraclavicular nodes. Bulky LEFT supraclavicular disease. Direct skeletal invasion of the manubrium and first RIGHT rib costamanubrial junction. Soft tissue extension into the medial border of the overlying RIGHT pectoralis muscle. Discrete hypermetabolic RIGHT upper lobe pulmonary nodule consistent with metastatic disease. No evidence metastatic disease in the abdomen pelvis. -12/23/2022: 1 dose of concurrent chemo XRT -12/29/2022: Left lung and left bronchus radiation therapy completed -12/28/2022: MRI Brain: No evidence of intracranial metastatic disease.  -01/03/2023: [facility] NGS: TMB-high.  MSI-stable.  PIK3CA and TP 53 mutations present.  No other targetable mutations.  PD-L1 TPS 0%.  HER2 IHC 0. -01/13/2023-04/28/2023: 6 cycles of Carboplatin , paclitaxel , durvalumab  and tremelimumab  completed -05/20/2023-Current:  Maintenance Durvalumab  every 28 days -03/20/2024: CT CAP: Evaluation of the chest is limited by breath motion artifact throughout. Within this limitation, similar post treatment appearance of the chest with dense fibrotic scarring and volume loss of the left upper lobe, treated mass approximately 3.1 x 2.6 cm. Interval increase in size of an irregular subpleural opacity in the peripheral superior segment left lower lobe measuring 1.8 x 1.4 cm. Interval increase in scattered, irregular ground-glass opacity in the superior segment left lower lobe. This could reflect developing radiation change or other nonspecific infection or inflammation but is modestly concerning for locally recurrent/metastatic disease. -04/05/2024: Guardant360:NGS:  No targetable mutations identified. - 04/13/2024: PET scan: Striking interval improvement of the left apical mass and prior mediastinal, chest wall, and sternal involvement, with posttherapy changes at the left lung apex (SUV max 3.4, previously 27.0) and resolution of the prior hypermetabolic chest wall and destructive right sternal components. Mild residual sclerosis and lucency at the prior sternal site without hypermetabolic activity. Indistinct band of faint density in the left mediastinum adjacent to the left pulmonary artery, possibly residual from prior tumor, with SUV max 4.5. Left AP window lymph node measuring 0.8 cm short axis with SUV max 2.8, similar to blood pool. -06/22/2024: PET: New hematogenously distributed pulmonary metastases. Post radiation changes in the left upper lobe.   Current Treatment:  Maintenance Durvalumab  every 28 days  INTERVAL HISTORY:   Rodney Mahoney is here today for follow-up of metastatic squamous cell carcinoma of the left lung. He is accompanied by his wife today.   Bonham states his cough is stable. We discussed PET results, which show progression of cancer. Patient was educated on new treatment including  chemotherapy, its  side effects, and duration of treatment. He denies any headaches or dizziness.   His left sided musculoskeletal chest pain is stable and was improved with Flexeril .   Estel has chemotherapy-induced peripheral neuropathy in the form of almost constant numbness in the left hand and tingling in the toes of both feet. He states his neuropathy has recently worsened, though worsening of neuropathy is not chemotherapy-related as he has not received chemotherapy since November 2024. He denies being diabetic.   Brittany continues to be an active smoker. He currently drinks 2 beers a night.   I have reviewed the past medical history, past surgical history, social history and family history with the patient and they are unchanged from previous note.  ALLERGIES:  has no known allergies.  MEDICATIONS:  Current Outpatient Medications  Medication Sig Dispense Refill   acetaminophen  (TYLENOL ) 500 MG tablet Take 500 mg by mouth every 6 (six) hours as needed for mild pain.     aspirin  EC 81 MG tablet Take 1 tablet (81 mg total) by mouth daily. Swallow whole. 90 tablet 3   atorvastatin  (LIPITOR ) 80 MG tablet Take 1 tablet (80 mg total) by mouth daily. 90 tablet 3   cyclobenzaprine  (FLEXERIL ) 5 MG tablet Take 1 tablet (5 mg total) by mouth at bedtime. 5 tablet 0   gabapentin  (NEURONTIN ) 300 MG capsule Take 1 capsule (300 mg total) by mouth at bedtime. 30 capsule 2   lidocaine -prilocaine  (EMLA ) cream Apply to affected area once 30 g 3   metoprolol  succinate (TOPROL  XL) 25 MG 24 hr tablet Take 0.5 tablets (12.5 mg total) by mouth daily. 45 tablet 3   nitroGLYCERIN  (NITROSTAT ) 0.4 MG SL tablet Place 1 tablet (0.4 mg total) under the tongue every 5 (five) minutes as needed. 25 tablet 0   sildenafil  (VIAGRA ) 50 MG tablet TAKE 1 TABLET BY MOUTH ONCE DAILY AS NEEDED FOR ERECTILE DYSFUNCTION 4 tablet 0   No current facility-administered medications for this visit.    VITALS:  There were no vitals taken for this  visit.  Wt Readings from Last 3 Encounters:  06/29/24 183 lb (83 kg)  05/22/24 182 lb (82.6 kg)  05/17/24 181 lb 10.5 oz (82.4 kg)    There is no height or weight on file to calculate BMI.  Performance status (ECOG): 0 - Asymptomatic  PHYSICAL EXAM:   GENERAL:alert, no distress and comfortable SKIN: skin color, texture, turgor are normal, no rashes or significant lesions LYMPH:  no palpable lymphadenopathy in the cervical, axillary or inguinal LUNGS: Bilateral rales in all lung fields HEART: regular rate & rhythm and no murmurs and no lower extremity edema ABDOMEN:abdomen soft, non-tender and normal bowel sounds Musculoskeletal:no cyanosis of digits and no clubbing  NEURO: alert & oriented x 3 with fluent speech  LABORATORY DATA:  I have reviewed the data as listed   Lab Results  Component Value Date   WBC 8.4 06/29/2024   NEUTROABS 4.9 06/29/2024   HGB 13.6 06/29/2024   HCT 42.5 06/29/2024   MCV 95.3 06/29/2024   PLT 269 06/29/2024      Chemistry      Component Value Date/Time   NA 139 06/29/2024 0928   NA 137 10/28/2022 1501   K 4.1 06/29/2024 0928   CL 103 06/29/2024 0928   CO2 22 06/29/2024 0928   BUN 8 06/29/2024 0928   BUN 10 10/28/2022 1501   CREATININE 0.68 06/29/2024 0928   CREATININE 0.62 01/03/2024 1300   CREATININE 0.78  07/04/2021 1606      Component Value Date/Time   CALCIUM  9.4 06/29/2024 0928   ALKPHOS 86 06/29/2024 0928   AST 26 06/29/2024 0928   AST 23 01/03/2024 1300   ALT 26 06/29/2024 0928   ALT 21 01/03/2024 1300   BILITOT 0.6 06/29/2024 0928   BILITOT 1.0 01/03/2024 1300       Latest Reference Range & Units 06/29/24 09:28  TSH 0.350 - 4.500 uIU/mL 3.660   RADIOGRAPHIC STUDIES: I have personally reviewed the radiological images as listed and agreed with the findings in the report.   NM PET Image Restage (PS) Skull Base to Thigh (F-18 FDG) CLINICAL DATA:  Subsequent treatment strategy for non-small cell lung  cancer.  EXAM: NUCLEAR MEDICINE PET SKULL BASE TO THIGH  TECHNIQUE: 9.6 mCi F-18 FDG was injected intravenously. Full-ring PET imaging was performed from the skull base to thigh after the radiotracer. CT data was obtained and used for attenuation correction and anatomic localization.  Fasting blood glucose: 106 mg/dl  COMPARISON:  89/69/7974.  FINDINGS: Mediastinal blood pool activity: SUV max 2.7  Liver activity: SUV max NA  NECK:  No abnormal hypermetabolism.  Incidental CT findings:  None.  CHEST:  Post treatment patchy parenchymal consolidation, bronchiectasis and retraction in the left upper lobe with expected mild hypermetabolism. No focal abnormal hypermetabolism. No hypermetabolic lymph nodes.  Incidental CT findings:  Right IJ Port-A-Cath terminates in the high right atrium. Atherosclerotic calcification of the aorta, aortic valve and coronary arteries. Enlarged pulmonic trunk and heart. No pericardial or pleural effusion. Numerous small hematogenously distributed pulmonary nodules are largely new from 04/13/2024 but are generally too small for PET resolution. Centrilobular and paraseptal emphysema.  ABDOMEN/PELVIS:  No abnormal hypermetabolism.  Incidental CT findings:  Enlarged prostate.  SKELETON:  No abnormal hypermetabolism.  Incidental CT findings:  Degenerative changes in the spine.  IMPRESSION: 1. New hematogenously distributed pulmonary metastases. 2. Post radiation changes in the left upper lobe. 3. Enlarged prostate. 4. Aortic atherosclerosis (ICD10-I70.0). Coronary artery calcification. 5. Enlarged pulmonic trunk, indicative of pulmonary arterial hypertension. 6.  Emphysema (ICD10-J43.9).  Electronically Signed   By: Newell Eke M.D.   On: 06/28/2024 13:27   "

## 2024-06-29 ENCOUNTER — Inpatient Hospital Stay: Payer: Self-pay

## 2024-06-29 ENCOUNTER — Inpatient Hospital Stay: Payer: Self-pay | Attending: Hematology

## 2024-06-29 ENCOUNTER — Encounter (HOSPITAL_COMMUNITY): Payer: Self-pay | Admitting: Oncology

## 2024-06-29 ENCOUNTER — Inpatient Hospital Stay: Payer: Self-pay | Attending: Hematology | Admitting: Oncology

## 2024-06-29 DIAGNOSIS — C3492 Malignant neoplasm of unspecified part of left bronchus or lung: Secondary | ICD-10-CM

## 2024-06-29 DIAGNOSIS — Z923 Personal history of irradiation: Secondary | ICD-10-CM

## 2024-06-29 DIAGNOSIS — C7800 Secondary malignant neoplasm of unspecified lung: Secondary | ICD-10-CM | POA: Insufficient documentation

## 2024-06-29 DIAGNOSIS — T451X5A Adverse effect of antineoplastic and immunosuppressive drugs, initial encounter: Secondary | ICD-10-CM | POA: Insufficient documentation

## 2024-06-29 DIAGNOSIS — Z9221 Personal history of antineoplastic chemotherapy: Secondary | ICD-10-CM

## 2024-06-29 DIAGNOSIS — M7918 Myalgia, other site: Secondary | ICD-10-CM | POA: Diagnosis not present

## 2024-06-29 DIAGNOSIS — F17200 Nicotine dependence, unspecified, uncomplicated: Secondary | ICD-10-CM | POA: Diagnosis not present

## 2024-06-29 DIAGNOSIS — G620 Drug-induced polyneuropathy: Secondary | ICD-10-CM | POA: Insufficient documentation

## 2024-06-29 DIAGNOSIS — F1090 Alcohol use, unspecified, uncomplicated: Secondary | ICD-10-CM

## 2024-06-29 DIAGNOSIS — Z720 Tobacco use: Secondary | ICD-10-CM

## 2024-06-29 DIAGNOSIS — Z5111 Encounter for antineoplastic chemotherapy: Secondary | ICD-10-CM | POA: Diagnosis present

## 2024-06-29 DIAGNOSIS — C3412 Malignant neoplasm of upper lobe, left bronchus or lung: Secondary | ICD-10-CM | POA: Insufficient documentation

## 2024-06-29 DIAGNOSIS — Z79899 Other long term (current) drug therapy: Secondary | ICD-10-CM | POA: Diagnosis not present

## 2024-06-29 LAB — COMPREHENSIVE METABOLIC PANEL WITH GFR
ALT: 26 U/L (ref 0–44)
AST: 26 U/L (ref 15–41)
Albumin: 4 g/dL (ref 3.5–5.0)
Alkaline Phosphatase: 86 U/L (ref 38–126)
Anion gap: 14 (ref 5–15)
BUN: 8 mg/dL (ref 8–23)
CO2: 22 mmol/L (ref 22–32)
Calcium: 9.4 mg/dL (ref 8.9–10.3)
Chloride: 103 mmol/L (ref 98–111)
Creatinine, Ser: 0.68 mg/dL (ref 0.61–1.24)
GFR, Estimated: >60 mL/min
Glucose, Bld: 89 mg/dL (ref 70–99)
Potassium: 4.1 mmol/L (ref 3.5–5.1)
Sodium: 139 mmol/L (ref 135–145)
Total Bilirubin: 0.6 mg/dL (ref 0.0–1.2)
Total Protein: 7 g/dL (ref 6.5–8.1)

## 2024-06-29 LAB — CBC WITH DIFFERENTIAL/PLATELET
Abs Immature Granulocytes: 0.02 K/uL (ref 0.00–0.07)
Basophils Absolute: 0.1 K/uL (ref 0.0–0.1)
Basophils Relative: 1 %
Eosinophils Absolute: 0.2 K/uL (ref 0.0–0.5)
Eosinophils Relative: 2 %
HCT: 42.5 % (ref 39.0–52.0)
Hemoglobin: 13.6 g/dL (ref 13.0–17.0)
Immature Granulocytes: 0 %
Lymphocytes Relative: 29 %
Lymphs Abs: 2.4 K/uL (ref 0.7–4.0)
MCH: 30.5 pg (ref 26.0–34.0)
MCHC: 32 g/dL (ref 30.0–36.0)
MCV: 95.3 fL (ref 80.0–100.0)
Monocytes Absolute: 0.8 K/uL (ref 0.1–1.0)
Monocytes Relative: 9 %
Neutro Abs: 4.9 K/uL (ref 1.7–7.7)
Neutrophils Relative %: 59 %
Platelets: 269 K/uL (ref 150–400)
RBC: 4.46 MIL/uL (ref 4.22–5.81)
RDW: 15.2 % (ref 11.5–15.5)
WBC: 8.4 K/uL (ref 4.0–10.5)
nRBC: 0 % (ref 0.0–0.2)

## 2024-06-29 LAB — TSH: TSH: 3.66 u[IU]/mL (ref 0.350–4.500)

## 2024-06-29 LAB — MAGNESIUM: Magnesium: 2 mg/dL (ref 1.7–2.4)

## 2024-06-29 NOTE — Progress Notes (Signed)
 Treatment today due to progressionper Dr. Davonna.

## 2024-06-29 NOTE — Progress Notes (Signed)
 DISCONTINUE OFF PATHWAY REGIMEN - Non-Small Cell Lung   [id number]:Durvalumab  1,500 mg IV D1 + Tremelimumab  75 mg IV D1 + Carboplatin  AUC=6 IV D1 + Nab-Paclitaxel  100 mg/m2 IV D1,8,15 q21 Days x 4 Cycles:   Cycles 1 through 4: A cycle is every 21 days:     Tremelimumab -actl      Durvalumab       Nab-paclitaxel  (protein bound)      Carboplatin    **Always confirm dose/schedule in your pharmacy ordering system**  PRIOR TREATMENT: Off Pathway: Durvalumab  1,500 mg IV D1 + Tremelimumab  75 mg IV D1 + Carboplatin  AUC=6 IV D1 + Nab-Paclitaxel  100 mg/m2 IV D1,8,15 q21 Days x 4 Cycles  START ON PATHWAY REGIMEN - Non-Small Cell Lung     A cycle is every 21 days:     Ramucirumab      Docetaxel   **Always confirm dose/schedule in your pharmacy ordering system**  Patient Characteristics: Stage IV Metastatic, Squamous, Molecular Analysis Completed, Molecular Alteration Present and Targeted Therapy Exhausted, OR EGFR/KRAS/HER2/NRG1 Present and Standard Chemotherapy/Immunotherapy Planned, OR No Alteration Present, PS = 0, 1, Second Line -  Chemotherapy/Immunotherapy, Prior PD-1/PD-L1 Inhibitor + Chemotherapy or No Prior PD-1/PD-L1 Inhibitor, and Not a Candidate for Immunotherapy Therapeutic Status: Stage IV Metastatic Histology: Squamous Cell Molecular Analysis Results: No Alteration Present Chemotherapy/Immunotherapy Line of Therapy: Second Line Chemotherapy/Immunotherapy ECOG Performance Status: 0 Prior Immunotherapy Status: Prior PD-1/PD-L1 Inhibitor + Chemotherapy Immunotherapy Candidate Status: Not a Candidate for Immunotherapy Intent of Therapy: Non-Curative / Palliative Intent, Discussed with Patient

## 2024-06-29 NOTE — Patient Instructions (Signed)
 [facility] at [facility] Discharge Instructions   You were seen and examined today by Dr. Davonna.  She reviewed the results of your lab work which are normal/stable.   She reviewed the results of your CT scan. It is showing the cancer has gotten worse. You have new cancerous nodules in your lungs.   We will need to restart treatment. Treatment will be given in the clinic every 3 weeks. Treatment consists of two different drugs. One is called Taxotere and the other is called Cyramza. You will also get a white blood cell booster shot two days after each treatment. You should take Claritin  starting the day of each chemo treatment and for 5 days after. This will help alleviate body aches and pains that this shot may cause.   We will plan to start you next week on this new treatment.    Return as scheduled.    Thank you for choosing [facility] at [facility] to provide your oncology and hematology care.  To afford each patient quality time with our provider, please arrive at least 15 minutes before your scheduled appointment time.   If you have a lab appointment with the Cancer Center please come in thru the Main Entrance and check in at the main information desk.  You need to re-schedule your appointment should you arrive 10 or more minutes late.  We strive to give you quality time with our providers, and arriving late affects you and other patients whose appointments are after yours.  Also, if you no show three or more times for appointments you may be dismissed from the clinic at the providers discretion.     Again, thank you for choosing [facility].  Our hope is that these requests will decrease the amount of time that you wait before being seen by our physicians.       _____________________________________________________________  Should you have questions after your visit to [facility], please contact our office at  [phone] and follow the prompts.  Our office hours are 8:00 a.m. and 4:30 p.m. Monday - Friday.  Please note that voicemails left after 4:00 p.m. may not be returned until the following business day.  We are closed weekends and major holidays.  You do have access to a nurse 24-7, just call the main number to the clinic [phone] and do not press any options, hold on the line and a nurse will answer the phone.    For prescription refill requests, have your pharmacy contact our office and allow 72 hours.    Due to Covid, you will need to wear a mask upon entering the hospital. If you do not have a mask, a mask will be given to you at the Main Entrance upon arrival. For doctor visits, patients may have 1 support person age 64 or older with them. For treatment visits, patients can not have anyone with them due to social distancing guidelines and our immunocompromised population.

## 2024-06-30 ENCOUNTER — Other Ambulatory Visit: Payer: Self-pay | Admitting: Oncology

## 2024-06-30 ENCOUNTER — Other Ambulatory Visit: Payer: Self-pay

## 2024-06-30 DIAGNOSIS — C3492 Malignant neoplasm of unspecified part of left bronchus or lung: Secondary | ICD-10-CM

## 2024-07-03 ENCOUNTER — Inpatient Hospital Stay: Payer: Self-pay | Admitting: Oncology

## 2024-07-03 NOTE — Progress Notes (Signed)
 Pharmacist Chemotherapy Monitoring - Initial Assessment    Anticipated start date: 07/05/24   The following has been reviewed per standard work regarding the patient's treatment regimen: The patient's diagnosis, treatment plan and drug doses, and organ/hematologic function Lab orders and baseline tests specific to treatment regimen  The treatment plan start date, drug sequencing, and pre-medications Prior authorization status  Patient's documented medication list, including drug-drug interaction screen and prescriptions for anti-emetics and supportive care specific to the treatment regimen The drug concentrations, fluid compatibility, administration routes, and timing of the medications to be used The patient's access for treatment and lifetime cumulative dose history, if applicable  The patient's medication allergies and previous infusion related reactions, if applicable   Changes made to treatment plan:  N/A  Follow up needed:  N/A   Rodney Mahoney Molt, [facility], 07/03/2024  9:32 PM

## 2024-07-05 ENCOUNTER — Inpatient Hospital Stay

## 2024-07-06 ENCOUNTER — Inpatient Hospital Stay

## 2024-07-06 VITALS — BP 119/73 | HR 86 | Temp 98.6°F | Resp 18 | Wt 179.8 lb

## 2024-07-06 DIAGNOSIS — C3492 Malignant neoplasm of unspecified part of left bronchus or lung: Secondary | ICD-10-CM

## 2024-07-06 DIAGNOSIS — Z5111 Encounter for antineoplastic chemotherapy: Secondary | ICD-10-CM | POA: Diagnosis not present

## 2024-07-06 LAB — URINALYSIS, DIPSTICK ONLY
Bilirubin Urine: NEGATIVE
Glucose, UA: NEGATIVE mg/dL
Hgb urine dipstick: NEGATIVE
Ketones, ur: NEGATIVE mg/dL
Leukocytes,Ua: NEGATIVE
Nitrite: NEGATIVE
Protein, ur: NEGATIVE mg/dL
Specific Gravity, Urine: 1.006 (ref 1.005–1.030)
pH: 6 (ref 5.0–8.0)

## 2024-07-06 MED ORDER — DIPHENHYDRAMINE HCL 50 MG/ML IJ SOLN
50.0000 mg | Freq: Once | INTRAMUSCULAR | Status: AC
  Administered 2024-07-06: 50 mg via INTRAVENOUS
  Filled 2024-07-06: qty 1

## 2024-07-06 MED ORDER — SODIUM CHLORIDE 0.9 % IV SOLN
10.0000 mg/kg | Freq: Once | INTRAVENOUS | Status: AC
  Administered 2024-07-06: 800 mg via INTRAVENOUS
  Filled 2024-07-06: qty 50

## 2024-07-06 MED ORDER — SODIUM CHLORIDE 0.9 % IV SOLN
75.0000 mg/m2 | Freq: Once | INTRAVENOUS | Status: AC
  Administered 2024-07-06: 160 mg via INTRAVENOUS
  Filled 2024-07-06: qty 16

## 2024-07-06 MED ORDER — ACETAMINOPHEN 325 MG PO TABS
650.0000 mg | ORAL_TABLET | Freq: Once | ORAL | Status: AC
  Administered 2024-07-06: 650 mg via ORAL
  Filled 2024-07-06: qty 2

## 2024-07-06 MED ORDER — DEXAMETHASONE SOD PHOSPHATE PF 10 MG/ML IJ SOLN
10.0000 mg | Freq: Once | INTRAMUSCULAR | Status: AC
  Administered 2024-07-06: 10 mg via INTRAVENOUS
  Filled 2024-07-06: qty 1

## 2024-07-06 MED ORDER — SODIUM CHLORIDE 0.9 % IV SOLN: INTRAVENOUS | Status: DC

## 2024-07-06 NOTE — Progress Notes (Signed)
 Patient presents today for C1D1 Cyramza  and Taxotere  infusion. Vital signs within parameters for treatment. Labs drawn on 06-29-2024 for todays treatment within parameters. Consent obtained today.   Baseline urine protein will be obtained today per Dr. Davonna okay to start treatment  without baseline urine protein.   Urine collected and sent to lab for baseline.   Patient coughing, non-productive. Message sent to Dr. Mariellen LABOR.Lenon PEAK. Message received to proceed with treatment today. Patient was seen last week and by Dr. Davonna . No change in lung sounds or cough.   Treatment given today per MD orders. Tolerated infusion without adverse affects. Vital signs stable. No complaints at this time. Discharged from clinic ambulatory in stable condition. Alert and oriented x 3. F/U with [facility] as scheduled.

## 2024-07-06 NOTE — Patient Instructions (Signed)
 [facility] - A DEPT OF [facility]  Discharge Instructions: Thank you for choosing [facility] to provide your oncology and hematology care.  If you have a lab appointment with the Cancer Center - please note that after April 8th, 2024, all labs will be drawn in the cancer center.  You do not have to check in or register with the main entrance as you have in the past but will complete your check-in in the cancer center.  Wear comfortable clothing and clothing appropriate for easy access to any Portacath or PICC line.   We strive to give you quality time with your provider. You may need to reschedule your appointment if you arrive late (15 or more minutes).  Arriving late affects you and other patients whose appointments are after yours.  Also, if you miss three or more appointments without notifying the office, you may be dismissed from the clinic at the providers discretion.      For prescription refill requests, have your pharmacy contact our office and allow 72 hours for refills to be completed.    Today you received the following chemotherapy and/or immunotherapy agents Cyramza , Taxotere . Docetaxel  Injection What is this medication? DOCETAXEL  (doe se TAX el) treats some types of cancer. It works by slowing down the growth of cancer cells. This medicine may be used for other purposes; ask your health care provider or pharmacist if you have questions. COMMON BRAND NAME(S): BEIZRAY , Docefrez , Docivyx , Taxotere  What should I tell my care team before I take this medication? They need to know if you have any of these conditions: Kidney disease Liver disease Low white blood cell levels Tingling of the fingers or toes or other nerve disorder An unusual or allergic reaction to docetaxel , polysorbate 80, other medications, foods, dyes, or preservatives Pregnant or trying to get pregnant Breast-feeding How should I use this medication? This  medication is injected into a vein. It is given by your care team in a hospital or clinic setting. Talk to your care team about the use of this medication in children. Special care may be needed. Overdosage: If you think you have taken too much of this medicine contact a poison control center or emergency room at once. NOTE: This medicine is only for you. Do not share this medicine with others. What if I miss a dose? Keep appointments for follow-up doses. It is important not to miss your dose. Call your care team if you are unable to keep an appointment. What may interact with this medication? Do not take this medication with any of the following: Live virus vaccines This medication may also interact with the following: Certain antibiotics, such as clarithromycin, telithromycin Certain antivirals for HIV or hepatitis Certain medications for fungal infections, such as itraconazole, ketoconazole, voriconazole Grapefruit juice Nefazodone Supplements, such as St. John's wort This list may not describe all possible interactions. Give your health care provider a list of all the medicines, herbs, non-prescription drugs, or dietary supplements you use. Also tell them if you smoke, drink alcohol, or use illegal drugs. Some items may interact with your medicine. What should I watch for while using this medication? This medication may make you feel generally unwell. This is not uncommon as chemotherapy can affect healthy cells as well as cancer cells. Report any side effects. Continue your course of treatment even though you feel ill unless your care team tells you to stop. You may need blood work done while  you are taking this medication. This medication can cause serious side effects and infusion reactions. To reduce the risk, your care team may give you other medications to take before receiving this one. Be sure to follow the directions from your care team. This medication may increase your risk of  getting an infection. Call your care team for advice if you get a fever, chills, sore throat, or other symptoms of a cold or flu. Do not treat yourself. Try to avoid being around people who are sick. Avoid taking medications that contain aspirin , acetaminophen , ibuprofen, naproxen, or ketoprofen unless instructed by your care team. These medications may hide a fever. Be careful brushing or flossing your teeth or using a toothpick because you may get an infection or bleed more easily. If you have any dental work done, tell your dentist you are receiving this medication. Some products may contain alcohol. Ask your care team if this medication contains alcohol. Be sure to tell all care teams you are taking this medicine. Certain medications, like metronidazole  and disulfiram, can cause an unpleasant reaction when taken with alcohol. The reaction includes flushing, headache, nausea, vomiting, sweating, and increased thirst. The reaction can last from 30 minutes to several hours. This medication may affect your coordination, reaction time, or judgement. Do not drive or operate machinery until you know how this medication affects you. Sit up or stand slowly to reduce the risk of dizzy or fainting spells. Drinking alcohol with this medication can increase the risk of these side effects. Talk to your care team about your risk of cancer. You may be more at risk for certain types of cancer if you take this medication. Talk to your care team if you wish to become pregnant or think you might be pregnant. This medication can cause serious birth defects if taken during pregnancy or if you get pregnant within 2 months after stopping therapy. A negative pregnancy test is required before starting this medication. A reliable form of contraception is recommended while taking this medication and for 2 months after stopping it. Talk to your care team about reliable forms of contraception. Do not breast-feed while taking this  medication and for 1 week after stopping therapy. Use a condom during sex and for 4 months after stopping therapy. Tell your care team right away if you think your partner might be pregnant. This medication can cause serious birth defects. This medication may cause infertility. Talk to your care team if you are concerned about your fertility. What side effects may I notice from receiving this medication? Side effects that you should report to your care team as soon as possible: Allergic reactions--skin rash, itching, hives, swelling of the face, lips, tongue, or throat Change in vision such as blurry vision, seeing halos around lights, vision loss Infection--fever, chills, cough, or sore throat Infusion reactions--chest pain, shortness of breath or trouble breathing, feeling faint or lightheaded Low red blood cell level--unusual weakness or fatigue, dizziness, headache, trouble breathing Pain, tingling, or numbness in the hands or feet Painful swelling, warmth, or redness of the skin, blisters or sores at the infusion site Redness, blistering, peeling, or loosening of the skin, including inside the mouth Sudden or severe stomach pain, bloody diarrhea, fever, nausea, vomiting Swelling of the ankles, hands, or feet Tumor lysis syndrome (TLS)--nausea, vomiting, diarrhea, decrease in the amount of urine, dark urine, unusual weakness or fatigue, confusion, muscle pain or cramps, fast or irregular heartbeat, joint pain Unusual bruising or bleeding Side effects that usually  do not require medical attention (report to your care team if they continue or are bothersome): Change in nail shape, thickness, or color Change in taste Hair loss Increased tears This list may not describe all possible side effects. Call your doctor for medical advice about side effects. You may report side effects to FDA at 1-800-FDA-1088. Where should I keep my medication? This medication is given in a hospital or clinic. It  will not be stored at home. NOTE: This sheet is a summary. It may not cover all possible information. If you have questions about this medicine, talk to your doctor, pharmacist, or health care provider.  2024 Elsevier/Gold Standard (2021-08-07 00:00:00)Ramucirumab  Injection What is this medication? RAMUCIRUMAB  (ra mue SIR ue mab) treats some types of cancer. It works by blocking a protein that causes cancer cells to grow and multiply. This helps to slow or stop the spread of cancer cells. It is a monoclonal antibody. This medicine may be used for other purposes; ask your health care provider or pharmacist if you have questions. COMMON BRAND NAME(S): Cyramza  What should I tell my care team before I take this medication? They need to know if you have any of these conditions: Blood clots Having or recent surgery Heart attack High blood pressure History of a tear in your stomach or intestines Liver disease Protein in your urine Stomach bleeding Stroke Thyroid  disease An unusual or allergic reaction to ramucirumab , other medications, foods, dyes, or preservatives Pregnant or trying to get pregnant Breast-feeding How should I use this medication? This medication is injected into a vein. It is given by your care team in a hospital or clinic setting. Talk to your care team about the use of this medication in children. Special care may be needed. Overdosage: If you think you have taken too much of this medicine contact a poison control center or emergency room at once. NOTE: This medicine is only for you. Do not share this medicine with others. What if I miss a dose? Keep appointments for follow-up doses. It is important not to miss your dose. Call your care team if you are unable to keep an appointment. What may interact with this medication? Interactions have not been studied. This list may not describe all possible interactions. Give your health care provider a list of all the medicines,  herbs, non-prescription drugs, or dietary supplements you use. Also tell them if you smoke, drink alcohol, or use illegal drugs. Some items may interact with your medicine. What should I watch for while using this medication? Your condition will be monitored carefully while you are receiving this medication. You may need blood work while taking this medication. This medication may make you feel generally unwell. This is not uncommon as chemotherapy can affect health cells as well as cancer cells. Report any side effects. Continue your course of treatment even though you feel ill unless your care team tells you to stop. This medication may increase your risk to bruise or bleed. Call your care team if you notice any unusual bleeding. Before having surgery, talk to your care team to make sure it is ok. This medication can increase the risk of poor healing of your surgical site or wound. You will need to stop this medication for 28 days before surgery. After surgery, wait at least 2 weeks before restarting this medication. Make sure the surgical site or wound is healed enough before restarting this medication. Talk to your care team if questions. Talk to your care team  if you may be pregnant. Serious birth defects can occur if you take this medication during pregnancy and for 3 months after the last dose. You will need a negative pregnancy test before starting this medication. Contraception is recommended while taking this medication and for 3 months after the last dose. Your care team can help you find the option that works for you. Do not breastfeed while taking this medication and for 2 months after the last dose. This medication may cause infertility. Talk to your care team if you are concerned about your fertility. What side effects may I notice from receiving this medication? Side effects that you should report to your care team as soon as possible: Allergic reactions--skin rash, itching, hives,  swelling of the face, lips, tongue, or throat Bleeding--bloody or black, tar-like stools, vomiting blood or brown material that looks like coffee grounds, red or dark brown urine, small red or purple spots on skin, unusual bruising or bleeding Dizziness, loss of balance or coordination, confusion or trouble speaking Heart attack--pain or tightness in the chest, shoulders, arms, or jaw, nausea, shortness of breath, cold or clammy skin, feeling faint or lightheaded Increase in blood pressure Infection--fever, chills, cough, sore throat, wounds that don't heal, pain or trouble when passing urine, general feeling of discomfort or being unwell Infusion reactions--chest pain, shortness of breath or trouble breathing, feeling faint or lightheaded Kidney injury--decrease in the amount of urine, swelling of the ankles, hands, or feet Liver injury--right upper belly pain, loss of appetite, nausea, light-colored stool, dark yellow or brown urine, yellowing skin or eyes, unusual weakness or fatigue Low thyroid  levels (hypothyroidism)--unusual weakness or fatigue, increased sensitivity to cold, constipation, hair loss, dry skin, weight gain, feelings of depression Stomach pain that is severe, does not go away, or gets worse Stroke--sudden numbness or weakness of the face, arm, or leg, trouble speaking, confusion, trouble walking, loss of balance or coordination, dizziness, severe headache, change in vision Sudden and severe headache, confusion, change in vision, seizures, which may be signs of posterior reversible encephalopathy syndrome (PRES) Side effects that usually do not require medical attention (report to your care team if they continue or are bothersome): Diarrhea Fatigue Stomach pain Swelling of the ankles, hands, or feet This list may not describe all possible side effects. Call your doctor for medical advice about side effects. You may report side effects to FDA at 1-800-FDA-1088. Where should I  keep my medication? This medication is given in a hospital or clinic. It will not be stored at home. NOTE: This sheet is a summary. It may not cover all possible information. If you have questions about this medicine, talk to your doctor, pharmacist, or health care provider.  2024 Elsevier/Gold Standard (2021-10-23 00:00:00)      To help prevent nausea and vomiting after your treatment, we encourage you to take your nausea medication as directed.  BELOW ARE SYMPTOMS THAT SHOULD BE REPORTED IMMEDIATELY: *FEVER GREATER THAN 100.4 F (38 C) OR HIGHER *CHILLS OR SWEATING *NAUSEA AND VOMITING THAT IS NOT CONTROLLED WITH YOUR NAUSEA MEDICATION *UNUSUAL SHORTNESS OF BREATH *UNUSUAL BRUISING OR BLEEDING *URINARY PROBLEMS (pain or burning when urinating, or frequent urination) *BOWEL PROBLEMS (unusual diarrhea, constipation, pain near the anus) TENDERNESS IN MOUTH AND THROAT WITH OR WITHOUT PRESENCE OF ULCERS (sore throat, sores in mouth, or a toothache) UNUSUAL RASH, SWELLING OR PAIN  UNUSUAL VAGINAL DISCHARGE OR ITCHING   Items with * indicate a potential emergency and should be followed up as soon as possible  or go to the Emergency Department if any problems should occur.  Please show the CHEMOTHERAPY ALERT CARD or IMMUNOTHERAPY ALERT CARD at check-in to the Emergency Department and triage nurse.  Should you have questions after your visit or need to cancel or reschedule your appointment, please contact [facility] [place] - A DEPT OF [facility] [phone]  and follow the prompts.  Office hours are 8:00 a.m. to 4:30 p.m. Monday - Friday. Please note that voicemails left after 4:00 p.m. may not be returned until the following business day.  We are closed weekends and major holidays. You have access to a nurse at all times for urgent questions. Please call the main number to the clinic [phone] and follow the prompts.  For any non-urgent questions, you may also  contact your provider using MyChart. We now offer e-Visits for anyone 49 and older to request care online for non-urgent symptoms. For details visit [email or web address].   Also download the MyChart app! Go to the app store, search MyChart, open the app, select [place], and log in with your MyChart username and password.

## 2024-07-07 ENCOUNTER — Inpatient Hospital Stay

## 2024-07-07 NOTE — Progress Notes (Signed)
 24 hour call back: Called patient's cell phone and unable to leave message for the patient. Mail box was full. Patient did not show for his appointment for the injection today at 12:45 pm.

## 2024-07-18 ENCOUNTER — Telehealth: Payer: Self-pay | Admitting: *Deleted

## 2024-07-18 ENCOUNTER — Encounter: Payer: Self-pay | Admitting: *Deleted

## 2024-07-18 NOTE — Telephone Encounter (Signed)
 Opened in error

## 2024-07-18 NOTE — Progress Notes (Signed)
 Multiple attempts made to contact patient and his wife yesterday and this morning, as Dr. Davonna received notification that he was not eating well, feeling weak and had multiple other issues.  Patient presented to the office with wife this afternoon c/o the feeling that he has glass in his throat.  Appears to be horse and very SOB.  States that he has had black tarry stools and is weak.  I advised that I needed to take him to the ER for evaluation.  I was unable to convince him to do so.  He stated he thought we could just give him a pill to help and he wasn't going to any ER.  He walked away and departed on the elevator with his wife.  She too, attempted to encourage ER evaluation.  Dr. Davonna made aware.

## 2024-07-19 ENCOUNTER — Emergency Department (HOSPITAL_COMMUNITY)
Admission: EM | Admit: 2024-07-19 | Discharge: 2024-07-19 | Disposition: A | Discharge status: Home / Self Care | Source: Ambulatory Visit | Attending: Emergency Medicine | Admitting: Emergency Medicine

## 2024-07-19 ENCOUNTER — Emergency Department (HOSPITAL_COMMUNITY)

## 2024-07-19 ENCOUNTER — Other Ambulatory Visit: Payer: Self-pay

## 2024-07-19 ENCOUNTER — Encounter (HOSPITAL_COMMUNITY): Payer: Self-pay

## 2024-07-19 DIAGNOSIS — J358 Other chronic diseases of tonsils and adenoids: Secondary | ICD-10-CM

## 2024-07-19 LAB — COMPREHENSIVE METABOLIC PANEL WITH GFR
ALT: 55 U/L — ABNORMAL HIGH (ref 0–44)
AST: 38 U/L (ref 15–41)
Albumin: 3.4 g/dL — ABNORMAL LOW (ref 3.5–5.0)
Alkaline Phosphatase: 80 U/L (ref 38–126)
Anion gap: 12 (ref 5–15)
BUN: <5 mg/dL — ABNORMAL LOW (ref 8–23)
CO2: 25 mmol/L (ref 22–32)
Calcium: 8.7 mg/dL — ABNORMAL LOW (ref 8.9–10.3)
Chloride: 101 mmol/L (ref 98–111)
Creatinine, Ser: 0.68 mg/dL (ref 0.61–1.24)
GFR, Estimated: >60 mL/min
Glucose, Bld: 94 mg/dL (ref 70–99)
Potassium: 3.7 mmol/L (ref 3.5–5.1)
Sodium: 139 mmol/L (ref 135–145)
Total Bilirubin: 0.3 mg/dL (ref 0.0–1.2)
Total Protein: 6.4 g/dL — ABNORMAL LOW (ref 6.5–8.1)

## 2024-07-19 LAB — CBC WITH DIFFERENTIAL/PLATELET
Abs Immature Granulocytes: 0.05 10*3/uL (ref 0.00–0.07)
Basophils Absolute: 0 10*3/uL (ref 0.0–0.1)
Basophils Relative: 1 %
Eosinophils Absolute: 0 10*3/uL (ref 0.0–0.5)
Eosinophils Relative: 0 %
HCT: 38.9 % — ABNORMAL LOW (ref 39.0–52.0)
Hemoglobin: 13.1 g/dL (ref 13.0–17.0)
Immature Granulocytes: 1 %
Lymphocytes Relative: 24 %
Lymphs Abs: 1.7 10*3/uL (ref 0.7–4.0)
MCH: 30.6 pg (ref 26.0–34.0)
MCHC: 33.7 g/dL (ref 30.0–36.0)
MCV: 90.9 fL (ref 80.0–100.0)
Monocytes Absolute: 1.2 10*3/uL — ABNORMAL HIGH (ref 0.1–1.0)
Monocytes Relative: 18 %
Neutro Abs: 4 10*3/uL (ref 1.7–7.7)
Neutrophils Relative %: 56 %
Platelets: 258 10*3/uL (ref 150–400)
RBC: 4.28 MIL/uL (ref 4.22–5.81)
RDW: 14 % (ref 11.5–15.5)
WBC: 7 10*3/uL (ref 4.0–10.5)
nRBC: 0 % (ref 0.0–0.2)

## 2024-07-19 LAB — RESP PANEL BY RT-PCR (RSV, FLU A&B, COVID)  RVPGX2
Influenza A by PCR: NEGATIVE
Influenza B by PCR: NEGATIVE
Resp Syncytial Virus by PCR: NEGATIVE
SARS Coronavirus 2 by RT PCR: NEGATIVE

## 2024-07-19 LAB — GROUP A STREP BY PCR: Group A Strep by PCR: NOT DETECTED

## 2024-07-19 MED ORDER — OXYMETAZOLINE HCL 0.05 % NA SOLN
1.0000 | Freq: Two times a day (BID) | NASAL | Status: DC | PRN
  Administered 2024-07-19: 1 via NASAL
  Filled 2024-07-19: qty 30

## 2024-07-19 MED ORDER — PANTOPRAZOLE SODIUM 40 MG IV SOLR
40.0000 mg | Freq: Once | INTRAVENOUS | Status: AC
  Administered 2024-07-19: 40 mg via INTRAVENOUS
  Filled 2024-07-19: qty 10

## 2024-07-19 MED ORDER — IOHEXOL 300 MG/ML  SOLN
75.0000 mL | Freq: Once | INTRAMUSCULAR | Status: AC | PRN
  Administered 2024-07-19: 75 mL via INTRAVENOUS

## 2024-07-19 MED ORDER — SODIUM CHLORIDE 0.9 % IV BOLUS
1000.0000 mL | Freq: Once | INTRAVENOUS | Status: AC
  Administered 2024-07-19: 1000 mL via INTRAVENOUS

## 2024-07-19 NOTE — ED Provider Notes (Signed)
 " [place] EMERGENCY DEPARTMENT AT [facility] Provider Note   CSN: [id number] Arrival date & time: 07/19/24  9252     Patient presents with: Sore Throat, Cough, and Nasal Congestion   Rodney Mahoney is a 64 y.o. male.  {Add pertinent medical, surgical, social history, OB history to YEP:67052} Patient with lung cancer.  Patient states that he has difficulty swallowing.   Sore Throat  Cough      Prior to Admission medications  Medication Sig Start Date End Date Taking? Authorizing Provider  acetaminophen  (TYLENOL ) 500 MG tablet Take 500 mg by mouth every 6 (six) hours as needed for mild pain.    [provider]  aspirin  EC 81 MG tablet Take 1 tablet (81 mg total) by mouth daily. Swallow whole. 06/27/21   Furth, Cadence H, PA-C  atorvastatin  (LIPITOR ) 80 MG tablet Take 1 tablet (80 mg total) by mouth daily. 11/26/23   Melvenia Manus BRAVO, MD  cyclobenzaprine  (FLEXERIL ) 5 MG tablet Take 1 tablet (5 mg total) by mouth at bedtime. 04/19/24   Kandala, Hyndavi, MD  gabapentin  (NEURONTIN ) 300 MG capsule Take 1 capsule (300 mg total) by mouth at bedtime. 03/27/24   Kandala, Hyndavi, MD  metoprolol  succinate (TOPROL  XL) 25 MG 24 hr tablet Take 0.5 tablets (12.5 mg total) by mouth daily. 02/22/24   Debera Jayson MATSU, MD  nitroGLYCERIN  (NITROSTAT ) 0.4 MG SL tablet Place 1 tablet (0.4 mg total) under the tongue every 5 (five) minutes as needed. 02/02/18   Henry Manuelita NOVAK, NP  sildenafil  (VIAGRA ) 50 MG tablet TAKE 1 TABLET BY MOUTH ONCE DAILY AS NEEDED FOR ERECTILE DYSFUNCTION 04/19/24   Bevely Doffing, FNP    Allergies: Patient has no known allergies.    Review of Systems  Respiratory:  Positive for cough.     Updated Vital Signs BP 93/76   Pulse 85   Temp 98.5 F (36.9 C) (Oral)   Resp 18   Ht 5' 6 (1.676 m)   Wt 79.4 kg   SpO2 94%   BMI 28.25 kg/m   Physical Exam  (all labs ordered are listed, but only abnormal results are displayed) Labs Reviewed  CBC  WITH DIFFERENTIAL/PLATELET - Abnormal; Notable for the following components:      Result Value   HCT 38.9 (*)    Monocytes Absolute 1.2 (*)    All other components within normal limits  COMPREHENSIVE METABOLIC PANEL WITH GFR - Abnormal; Notable for the following components:   BUN <5 (*)    Calcium  8.7 (*)    Total Protein 6.4 (*)    Albumin 3.4 (*)    ALT 55 (*)    All other components within normal limits  RESP PANEL BY RT-PCR (RSV, FLU A&B, COVID)  [id number]  GROUP A STREP BY PCR  POC OCCULT BLOOD, ED    EKG: None  Radiology: CT Soft Tissue Neck W Contrast Result Date: 07/19/2024 EXAM: CT NECK WITH CONTRAST 07/19/2024 11:22:25 AM TECHNIQUE: CT of the neck was performed with the administration of 75 mL of iohexol  (OMNIPAQUE ) 300 MG/ML solution. Multiplanar reformatted images are provided for review. Automated exposure control, iterative reconstruction, and/or weight based adjustment of the mA/kV was utilized to reduce the radiation dose to as low as reasonably achievable. COMPARISON: None available. CLINICAL HISTORY: Soft tissue infection suspected, neck, xray done. Suspected soft tissue infection of the neck; X-ray done. FINDINGS: AERODIGESTIVE TRACT: There is a 0.0 x 1.7 x 2.8 cm enhancing mass centered within the  right palatine tonsil series 2 image 30 and series 6 image 53. There are internal areas of cystic change demonstrating peripheral enhancement particularly along the superior aspect of the mass. This finding demonstrates greater degree of soft tissue and enhancement than would be expected for an abscess. Findings could reflect neoplasm involving the palatine tonsil. There are cystic foci within the lesion which could reflect areas of necrosis versus less likely peritonsillar abscesses. Recommend ENT consult and correlation with direct visualization. If this lesion does not improve with treatment for infection then biopsy is recommended for further evaluation. The nasopharynx is  symmetric. No significant narrowing of the oropharyngeal airway. Oral cavity and floor of mouth are unremarkable. Normal appearance of the oropharynx. Normal appearance of the retropharynx. The supraglottic airway is symmetric. Relatively symmetric appearance of the vocal folds. SALIVARY GLANDS: The parotid and submandibular glands are unremarkable. THYROID : Unremarkable. LYMPH NODES: There is a mildly enlarged and enhancing right level 2a cervical node measuring 1.0 cm in short axis series 2 image 36. SOFT TISSUES: Right IJ approach port a cath. No mass or fluid collection. BONES: Chronic appearing irregularity at the right superior aspect of the sternum. Degenerative changes throughout the visualized spine. ACDF from C5-C7. VASCULATURE: Atherosclerosis noted along the aortic arch and at the bilateral carotid bifurcations. There is atherosclerosis in the partially visualized carotid siphons. OTHER: Mucosal thickening in the ethmoid and maxillary sinuses. Mastoid air cells are well aerated. There are heterogeneous opacities throughout the left upper lobe with volume loss and areas of bronchiectasis which could reflect scarring or post radiation change. Recommend correlation with dedicated chest CT. There are additional areas of irregular nodular opacities in the right upper lobe which could reflect infectious or inflammatory etiology versus neoplasm. IMPRESSION: 1. Enhancing mass centered within the right palatine tonsil with internal cystic changes, concerning for neoplasm. Recommend ENT consult and direct visualization. 2. Mildly enlarged and enhancing right level 2A cervical node, concerning for potential nodal metastasis in the setting of suspected neoplasm. 3. Heterogeneous opacities in the left upper lobe with volume loss and bronchiectasis, possibly reflecting scarring or post-radiation change. Irregular nodular opacities in the right upper lobe, possibly reflecting infectious or inflammatory etiology versus  neoplasm. Recommend dedicated chest CT for further evaluation. Electronically signed by: Donnice Mania MD 07/19/2024 11:43 AM EST RP Workstation: [id number]   DG Chest Port 1 View Result Date: 07/19/2024 CLINICAL DATA:  Cough for 3 weeks EXAM: PORTABLE CHEST 1 VIEW COMPARISON:  January 04, 2024.  June 22, 2024. FINDINGS: Stable cardiomediastinal silhouette. Right internal jugular Port-A-Cath is unchanged. Stable elevated left hemidiaphragm. Left upper lobe density is noted consistent with post radiation change as noted on prior PET scan. Small nodular densities are seen in the right lung which may correspond with metastatic disease as noted on prior PET scan. IMPRESSION: Left upper lobe density is noted consistent with post radiation change as noted on prior PET scan. Small nodular densities are seen in the right lung which may correspond with metastatic disease as noted on prior PET scan. Electronically Signed   By: Lynwood Landy Raddle M.D.   On: 07/19/2024 09:10    {Document cardiac monitor, telemetry assessment procedure when appropriate:32947} Procedures   Medications Ordered in the ED  oxymetazoline  (AFRIN) 0.05 % nasal spray 1 spray (has no administration in time range)  sodium chloride  0.9 % bolus 1,000 mL (1,000 mLs Intravenous New Bag/Given 07/19/24 0905)  pantoprazole  (PROTONIX ) injection 40 mg (40 mg Intravenous Given 07/19/24 0904)  iohexol  (OMNIPAQUE ) 300  MG/ML solution 75 mL (75 mLs Intravenous Contrast Given 07/19/24 1110)  Patient with a tonsillar mass.  I spoke with ENT and they will follow-up in the office.  Also notified his oncologist, Dr. Davonna    {Click here for ABCD2, HEART and other calculators REFRESH Note before signing:1}                              Medical Decision Making Amount and/or Complexity of Data Reviewed Labs: ordered. Radiology: ordered.  Risk OTC drugs. Prescription drug management.   Tonsillar mass  {Document critical care time when appropriate  Document  review of labs and clinical decision tools ie [id number], etc  Document your independent review of radiology images and any outside records  Document your discussion with family members, caretakers and with consultants  Document social determinants of health affecting pt's care  Document your decision making why or why not admission, treatments were needed:32947:::1}   Final diagnoses:  Tonsillar mass    ED Discharge Orders     None        "

## 2024-07-19 NOTE — ED Triage Notes (Signed)
 Pt complaints of congestion, sore throat, and cough x3 three weeks. Pt had chemo 3 weeks ago on Thursday and states this was  his last dose. Pt states his breathing has been decent. 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

## 2024-07-19 NOTE — Discharge Instructions (Signed)
 Call Dr. Roark office to be seen next week.  If you have problems before then you should go to [facility].  Drink plenty of fluids and eat soft foods

## 2024-07-26 ENCOUNTER — Inpatient Hospital Stay

## 2024-07-26 ENCOUNTER — Inpatient Hospital Stay: Attending: Hematology | Admitting: Oncology

## 2024-07-26 ENCOUNTER — Institutional Professional Consult (permissible substitution) (INDEPENDENT_AMBULATORY_CARE_PROVIDER_SITE_OTHER): Admitting: Otolaryngology

## 2024-07-27 ENCOUNTER — Inpatient Hospital Stay: Payer: Self-pay

## 2024-07-28 ENCOUNTER — Inpatient Hospital Stay

## 2024-08-16 ENCOUNTER — Inpatient Hospital Stay

## 2024-08-16 ENCOUNTER — Inpatient Hospital Stay: Admitting: Oncology

## 2024-08-16 ENCOUNTER — Inpatient Hospital Stay: Attending: Hematology

## 2024-08-18 ENCOUNTER — Inpatient Hospital Stay

## 2024-09-06 ENCOUNTER — Inpatient Hospital Stay: Admitting: Oncology

## 2024-09-06 ENCOUNTER — Inpatient Hospital Stay

## 2024-09-08 ENCOUNTER — Inpatient Hospital Stay
